# Patient Record
Sex: Female | Born: 1960
Health system: Southern US, Community
[De-identification: ages and names within clinical notes are randomized; demographics above are authoritative.]

## PROBLEM LIST (undated history)

## (undated) DIAGNOSIS — T7840XA Allergy, unspecified, initial encounter: Secondary | ICD-10-CM

## (undated) DIAGNOSIS — I10 Essential (primary) hypertension: Secondary | ICD-10-CM

## (undated) DIAGNOSIS — H6690 Otitis media, unspecified, unspecified ear: Secondary | ICD-10-CM

## (undated) DIAGNOSIS — F419 Anxiety disorder, unspecified: Secondary | ICD-10-CM

## (undated) HISTORY — DX: Essential (primary) hypertension: I10

## (undated) HISTORY — DX: Anxiety disorder, unspecified: F41.9

## (undated) HISTORY — DX: Otitis media, unspecified, unspecified ear: H66.90

## (undated) HISTORY — PX: TUBAL LIGATION: SHX77

## (undated) HISTORY — PX: TONSILLECTOMY: SUR1361

## (undated) HISTORY — DX: Allergy, unspecified, initial encounter: T78.40XA

---

## 1998-07-26 ENCOUNTER — Other Ambulatory Visit: Admission: RE | Admit: 1998-07-26 | Discharge: 1998-07-26 | Payer: Self-pay | Admitting: Family Medicine

## 1999-06-09 ENCOUNTER — Encounter: Payer: Self-pay | Admitting: Family Medicine

## 1999-06-09 ENCOUNTER — Encounter: Admission: RE | Admit: 1999-06-09 | Discharge: 1999-06-09 | Payer: Self-pay | Admitting: Family Medicine

## 1999-07-21 ENCOUNTER — Encounter: Admission: RE | Admit: 1999-07-21 | Discharge: 1999-09-11 | Payer: Self-pay | Admitting: Family Medicine

## 1999-12-17 ENCOUNTER — Other Ambulatory Visit: Admission: RE | Admit: 1999-12-17 | Discharge: 1999-12-17 | Payer: Self-pay | Admitting: Family Medicine

## 2000-12-30 ENCOUNTER — Ambulatory Visit (HOSPITAL_COMMUNITY): Admission: RE | Admit: 2000-12-30 | Discharge: 2000-12-30 | Payer: Self-pay | Admitting: Gastroenterology

## 2002-01-30 ENCOUNTER — Other Ambulatory Visit: Admission: RE | Admit: 2002-01-30 | Discharge: 2002-01-30 | Payer: Self-pay | Admitting: Family Medicine

## 2003-09-21 ENCOUNTER — Emergency Department (HOSPITAL_COMMUNITY): Admission: EM | Admit: 2003-09-21 | Discharge: 2003-09-21 | Payer: Self-pay | Admitting: Family Medicine

## 2004-04-17 ENCOUNTER — Emergency Department (HOSPITAL_COMMUNITY): Admission: EM | Admit: 2004-04-17 | Discharge: 2004-04-18 | Payer: Self-pay | Admitting: Emergency Medicine

## 2006-12-05 ENCOUNTER — Emergency Department (HOSPITAL_COMMUNITY): Admission: EM | Admit: 2006-12-05 | Discharge: 2006-12-05 | Payer: Self-pay | Admitting: Emergency Medicine

## 2008-11-30 ENCOUNTER — Encounter: Admission: RE | Admit: 2008-11-30 | Discharge: 2008-11-30 | Payer: Self-pay | Admitting: Family Medicine

## 2009-02-12 ENCOUNTER — Encounter: Admission: RE | Admit: 2009-02-12 | Discharge: 2009-02-12 | Payer: Self-pay | Admitting: Family Medicine

## 2009-04-22 ENCOUNTER — Ambulatory Visit (HOSPITAL_COMMUNITY): Admission: RE | Admit: 2009-04-22 | Discharge: 2009-04-22 | Payer: Self-pay | Admitting: Family Medicine

## 2010-03-09 ENCOUNTER — Emergency Department (HOSPITAL_COMMUNITY)
Admission: EM | Admit: 2010-03-09 | Discharge: 2010-03-09 | Payer: Self-pay | Source: Home / Self Care | Admitting: Emergency Medicine

## 2010-04-14 ENCOUNTER — Emergency Department (HOSPITAL_COMMUNITY)
Admission: EM | Admit: 2010-04-14 | Discharge: 2010-04-14 | Payer: Self-pay | Source: Home / Self Care | Admitting: Emergency Medicine

## 2010-05-07 ENCOUNTER — Ambulatory Visit (HOSPITAL_BASED_OUTPATIENT_CLINIC_OR_DEPARTMENT_OTHER)
Admission: RE | Admit: 2010-05-07 | Discharge: 2010-05-07 | Payer: Self-pay | Source: Home / Self Care | Attending: Family Medicine | Admitting: Family Medicine

## 2010-05-11 ENCOUNTER — Encounter: Payer: Self-pay | Admitting: Family Medicine

## 2010-06-19 ENCOUNTER — Encounter: Payer: Self-pay | Admitting: Cardiovascular Disease

## 2010-06-30 ENCOUNTER — Encounter: Payer: Self-pay | Admitting: Cardiovascular Disease

## 2010-07-01 LAB — BASIC METABOLIC PANEL
CO2: 29 mEq/L (ref 19–32)
Calcium: 9.1 mg/dL (ref 8.4–10.5)
Creatinine, Ser: 1.05 mg/dL (ref 0.4–1.2)
GFR calc Af Amer: >60 mL/min (ref >60)
GFR calc non Af Amer: 56 mL/min — ABNORMAL LOW (ref >60)
Sodium: 136 mEq/L (ref 135–145)

## 2010-07-01 LAB — CBC
Hemoglobin: 11.4 g/dL — ABNORMAL LOW (ref 12.0–15.0)
MCH: 28.2 pg (ref 26.0–34.0)
MCHC: 34.5 g/dL (ref 30.0–36.0)
Platelets: 311 10*3/uL (ref 150–400)
RBC: 4.05 MIL/uL (ref 3.87–5.11)

## 2010-07-01 LAB — DIFFERENTIAL
Basophils Relative: 0 % (ref 0–1)
Eosinophils Absolute: 0 10*3/uL (ref 0.0–0.7)
Neutro Abs: 8.6 10*3/uL — ABNORMAL HIGH (ref 1.7–7.7)
Neutrophils Relative %: 74 % (ref 43–77)

## 2010-07-01 LAB — POCT PREGNANCY, URINE: Preg Test, Ur: NEGATIVE

## 2010-07-04 ENCOUNTER — Encounter: Payer: Self-pay | Admitting: Cardiovascular Disease

## 2010-07-04 ENCOUNTER — Ambulatory Visit (INDEPENDENT_AMBULATORY_CARE_PROVIDER_SITE_OTHER): Payer: 59 | Admitting: Cardiovascular Disease

## 2010-07-04 DIAGNOSIS — R55 Syncope and collapse: Secondary | ICD-10-CM

## 2010-07-07 ENCOUNTER — Other Ambulatory Visit: Payer: Self-pay | Admitting: Cardiovascular Disease

## 2010-07-07 DIAGNOSIS — R55 Syncope and collapse: Secondary | ICD-10-CM

## 2010-07-08 NOTE — Assessment & Plan Note (Signed)
Summary: syncope/ref dr Sonia Baller [phone] cign[employer]/mt   Visit Type:  new pt visit Referring Provider:  [name] Primary Provider:  [name]  CC:  pt states she had syncope episode back in Nov and states she was sick at the time when this episode happened said she had just been put on prednisone at that time as well.......  History of Present Illness: 50 yo WF with history of HTN, allergic rhinitis, recurrent ear infections who is here today for evaluation of syncope. She tells me that she passed out in November. She was told at [facility] at that time that her potassium was low. Since then, she has occasional dizziness. She feels that the room is spinning. There is no associated chest pain, SOB, diaphoresis, n/v. She has had no syncope over the last five months. She has been battling sinus issues and was recently told that her tympanic membranes were swollen. She has been on antibiotics and steroids. She has no awareness of tachycardia, palpitations or awareness of irregularity of her heart rhythm.   Current Medications (verified): 1)  Lisinopril-Hydrochlorothiazide 10-12.5 Mg Tabs (Lisinopril-Hydrochlorothiazide) .Marland Kitchen.. 1 Tab Once Daily 2)  Klonopin 0.5 Mg Tabs (Clonazepam) .Marland Kitchen.. 1 Tab Three Times A Day As Needed 3)  Klor-Con 10 10 Meq Cr-Tabs (Potassium Chloride) .Marland Kitchen.. 1 Tab Once Daily 4)  Prednisone (Pak) 5 Mg Tabs (Prednisone) .... As Directed 5)  Augmentin 500-125 Mg Tabs (Amoxicillin-Pot Clavulanate) .Marland Kitchen.. 1 Tab Three Times A Day 6)  Zyrtec Allergy 10 Mg Tabs (Cetirizine Hcl) .Marland Kitchen.. 1 Tab At Bedtime 7)  Nasonex 50 Mcg/act Susp (Mometasone Furoate) .... As Directed  Allergies (verified): No Known Drug Allergies  Past History:  Past Medical History: HTN Anxiety Recurrent ear infections  Past Surgical History: Bilateral tubal ligation Tonsillectomy  Family History: Mother-alive, HTN Father-deceased, lung cancer 1 sister and 1 brother-both alive  and healthy  Social History: Remote tobacco, none for 25 years No alcohol No drug use Married, 3 children Customer service at [employer]  Review of Systems       The patient complains of dizziness.  The patient denies fatigue, malaise, fever, weight gain/loss, vision loss, decreased hearing, hoarseness, chest pain, palpitations, shortness of breath, prolonged cough, wheezing, sleep apnea, coughing up blood, abdominal pain, blood in stool, nausea, vomiting, diarrhea, heartburn, incontinence, blood in urine, muscle weakness, joint pain, leg swelling, rash, skin lesions, headache, fainting, depression, anxiety, enlarged lymph nodes, easy bruising or bleeding, and environmental allergies.    Vital Signs:  Patient profile:   50 year old female Height:      67.5 inches Weight:      243.50 pounds BMI:     37.71 Pulse rate:   74 / minute Pulse (ortho):   78 / minute Pulse rhythm:   regular BP sitting:   114 / 80  (left arm) BP standing:   113 / 73 Cuff size:   regular  Vitals Entered By: Danielle Rankin, CMA (July 04, 2010 4:22 PM)  Serial Vital Signs/Assessments:  Time      Position  BP       Pulse  Resp  Temp     By 4:41 PM   Lying LA  120/75   74                    Danielle Rankin, CMA 4:44 PM   Sitting   124/74   75  Danielle Rankin, CMA 4:45 PM   Standing  113/73   [address] [place] 4:47 PM   Standing  111/76   [address], [place] 4:49 PM   Standing  107/75   89                    Danielle Rankin, [place]  Comments: 4:41 PM no sxms By: Danielle Rankin, CMA  4:44 PM no sxms By: Danielle Rankin, CMA  4:45 PM no sxms By: Danielle Rankin, CMA  4:47 PM no sxms By: Danielle Rankin, CMA  4:49 PM no sxms By: Danielle Rankin, CMA    Physical Exam  General:  General: Well developed, well nourished, NAD HEENT: OP clear, mucus membranes moist SKIN: warm, dry Neuro: No focal deficits Musculoskeletal: Muscle strength 5/5 all ext Psychiatric: Mood  and affect normal Neck: No JVD, no carotid bruits, no thyromegaly, no lymphadenopathy. Lungs:Clear bilaterally, no wheezes, rhonci, crackles CV: RRR no murmurs, gallops rubs Abdomen: soft, NT, ND, BS present Extremities: No edema, pulses 2+.    EKG  Procedure date:  07/04/2010  Findings:      NSR, poor R wave progression through the precordial leads.   Impression & Recommendations:  Problem # 1:  SYNCOPE AND COLLAPSE (ICD-780.2) One syncopal episode 5 months ago with 1-2 episodes per week of dizziness with the feeling that the room is spinning. No awareness of chest pain, palpitations or irregularity of her heart rhythm. I do not think this is arrhythmia related. I will check an echo to exclude structural heart disease. I will also check carotid artery dopplers to exclude carotid artery stenosis or fibromuscular dysplasia. This is most likely related to her ongoing sinus issues with otitits media or from vertigo. I do not think a tilt table test is necessary at this time and would most likely not be helpful. I will see her back in 3 weeks to review the results of the testing.  She is mildly orthostatic. Will have her drink extra water and add salt to her diet.   Her updated medication list for this problem includes:    Lisinopril-hydrochlorothiazide 10-12.5 Mg Tabs (Lisinopril-hydrochlorothiazide) .Marland Kitchen... 1 tab once daily    Prednisone (pak) 5 Mg Tabs (Prednisone) .Marland Kitchen... As directed  Other Orders: Echocardiogram (Echo) Carotid Duplex (Carotid Duplex) EKG w/ Interpretation (93000)  Patient Instructions: 1)  Your physician recommends that you schedule a follow-up appointment in: 3 weeks with Dr. Clifton James. 2)  Your physician recommends that you continue on your current medications as directed. Please refer to the Current Medication list given to you today. 3)  Your physician has requested that you have a carotid duplex. This test is an ultrasound of the carotid arteries in your neck. It  looks at blood flow through these arteries that supply the brain with blood. Allow one hour for this exam. There are no restrictions or special instructions. 4)  Your physician has requested that you have an echocardiogram.  Echocardiography is a painless test that uses sound waves to create images of your heart. It provides your doctor with information about the size and shape of your heart and how well your heart's chambers and valves are working.  This procedure takes approximately one hour. There are no restrictions for this procedure.

## 2010-07-08 NOTE — Consult Note (Signed)
Summary: [facility]   Imported By: Marylou Mccoy 07/03/2010 16:16:23  _____________________________________________________________________  External Attachment:    Type:   Image     Comment:   External Document

## 2010-07-08 NOTE — Consult Note (Signed)
Summary: [facility]   Imported By: Marylou Mccoy 07/03/2010 16:17:10  _____________________________________________________________________  External Attachment:    Type:   Image     Comment:   External Document

## 2010-07-16 ENCOUNTER — Other Ambulatory Visit (HOSPITAL_COMMUNITY): Payer: Self-pay | Admitting: Cardiovascular Disease

## 2010-07-16 DIAGNOSIS — R55 Syncope and collapse: Secondary | ICD-10-CM

## 2010-07-17 ENCOUNTER — Observation Stay (HOSPITAL_COMMUNITY)
Admission: EM | Admit: 2010-07-17 | Discharge: 2010-07-18 | Disposition: A | Discharge status: Home / Self Care | Payer: Managed Care, Other (non HMO) | Attending: Internal Medicine | Admitting: Internal Medicine

## 2010-07-17 ENCOUNTER — Observation Stay (HOSPITAL_COMMUNITY): Payer: Managed Care, Other (non HMO)

## 2010-07-17 ENCOUNTER — Emergency Department (HOSPITAL_COMMUNITY): Payer: Managed Care, Other (non HMO)

## 2010-07-17 DIAGNOSIS — D509 Iron deficiency anemia, unspecified: Secondary | ICD-10-CM | POA: Insufficient documentation

## 2010-07-17 DIAGNOSIS — F411 Generalized anxiety disorder: Secondary | ICD-10-CM | POA: Insufficient documentation

## 2010-07-17 DIAGNOSIS — T7840XA Allergy, unspecified, initial encounter: Secondary | ICD-10-CM | POA: Insufficient documentation

## 2010-07-17 DIAGNOSIS — I10 Essential (primary) hypertension: Secondary | ICD-10-CM | POA: Insufficient documentation

## 2010-07-17 DIAGNOSIS — E876 Hypokalemia: Secondary | ICD-10-CM | POA: Insufficient documentation

## 2010-07-17 DIAGNOSIS — R7301 Impaired fasting glucose: Secondary | ICD-10-CM | POA: Insufficient documentation

## 2010-07-17 DIAGNOSIS — R4789 Other speech disturbances: Secondary | ICD-10-CM | POA: Insufficient documentation

## 2010-07-17 DIAGNOSIS — X58XXXA Exposure to other specified factors, initial encounter: Secondary | ICD-10-CM | POA: Insufficient documentation

## 2010-07-17 DIAGNOSIS — J329 Chronic sinusitis, unspecified: Secondary | ICD-10-CM | POA: Insufficient documentation

## 2010-07-17 DIAGNOSIS — H539 Unspecified visual disturbance: Principal | ICD-10-CM | POA: Insufficient documentation

## 2010-07-17 DIAGNOSIS — E785 Hyperlipidemia, unspecified: Secondary | ICD-10-CM | POA: Insufficient documentation

## 2010-07-17 LAB — COMPREHENSIVE METABOLIC PANEL
ALT: 19 U/L (ref 0–35)
AST: 18 U/L (ref 0–37)
CO2: 27 mEq/L (ref 19–32)
Calcium: 8.8 mg/dL (ref 8.4–10.5)
GFR calc Af Amer: >60 mL/min (ref >60)
Potassium: 3.3 mEq/L — ABNORMAL LOW (ref 3.5–5.1)
Sodium: 138 mEq/L (ref 135–145)
Total Protein: 6.7 g/dL (ref 6.0–8.3)

## 2010-07-17 LAB — DIFFERENTIAL
Basophils Absolute: 0 10*3/uL (ref 0.0–0.1)
Basophils Relative: 0 % (ref 0–1)
Eosinophils Absolute: 0.1 10*3/uL (ref 0.0–0.7)
Neutro Abs: 5.1 10*3/uL (ref 1.7–7.7)
Neutrophils Relative %: 74 % (ref 43–77)

## 2010-07-17 LAB — APTT: aPTT: 26 seconds (ref 24–37)

## 2010-07-17 LAB — BASIC METABOLIC PANEL
Chloride: 100 mEq/L (ref 96–112)
Creatinine, Ser: 0.54 mg/dL (ref 0.4–1.2)
GFR calc Af Amer: >60 mL/min (ref >60)
Potassium: 3.4 mEq/L — ABNORMAL LOW (ref 3.5–5.1)
Sodium: 138 mEq/L (ref 135–145)

## 2010-07-17 LAB — PREGNANCY, URINE: Preg Test, Ur: NEGATIVE

## 2010-07-17 LAB — IRON AND TIBC
Saturation Ratios: 7 % — ABNORMAL LOW (ref 20–55)
UIBC: 317 ug/dL

## 2010-07-17 LAB — CBC
Hemoglobin: 10.7 g/dL — ABNORMAL LOW (ref 12.0–15.0)
Platelets: 252 10*3/uL (ref 150–400)
RBC: 4.24 MIL/uL (ref 3.87–5.11)
WBC: 6.9 10*3/uL (ref 4.0–10.5)

## 2010-07-17 LAB — URINALYSIS, ROUTINE W REFLEX MICROSCOPIC
Ketones, ur: NEGATIVE mg/dL
Nitrite: NEGATIVE
Specific Gravity, Urine: 1.018 (ref 1.005–1.030)
pH: 5.5 (ref 5.0–8.0)

## 2010-07-17 LAB — CARDIAC PANEL(CRET KIN+CKTOT+MB+TROPI): Relative Index: INVALID (ref 0.0–2.5)

## 2010-07-17 LAB — GLUCOSE, CAPILLARY: Glucose-Capillary: 136 mg/dL — ABNORMAL HIGH (ref 70–99)

## 2010-07-18 ENCOUNTER — Other Ambulatory Visit (HOSPITAL_COMMUNITY): Payer: 59 | Admitting: Radiology

## 2010-07-18 ENCOUNTER — Other Ambulatory Visit: Payer: Self-pay | Admitting: Internal Medicine

## 2010-07-18 ENCOUNTER — Encounter: Payer: 59 | Admitting: Cardiology

## 2010-07-18 DIAGNOSIS — G459 Transient cerebral ischemic attack, unspecified: Secondary | ICD-10-CM

## 2010-07-18 LAB — COMPREHENSIVE METABOLIC PANEL
Albumin: 3 g/dL — ABNORMAL LOW (ref 3.5–5.2)
Alkaline Phosphatase: 68 U/L (ref 39–117)
BUN: 7 mg/dL (ref 6–23)
Creatinine, Ser: 0.56 mg/dL (ref 0.4–1.2)
Glucose, Bld: 113 mg/dL — ABNORMAL HIGH (ref 70–99)
Total Bilirubin: 1.1 mg/dL (ref 0.3–1.2)
Total Protein: 6 g/dL (ref 6.0–8.3)

## 2010-07-18 LAB — LIPID PANEL
Cholesterol: 163 mg/dL (ref 0–200)
LDL Cholesterol: 127 mg/dL — ABNORMAL HIGH (ref 0–99)
Total CHOL/HDL Ratio: 8.6 RATIO

## 2010-07-18 LAB — CBC
HCT: 31.8 % — ABNORMAL LOW (ref 36.0–46.0)
MCH: 25.2 pg — ABNORMAL LOW (ref 26.0–34.0)
MCV: 81.7 fL (ref 78.0–100.0)
Platelets: 254 10*3/uL (ref 150–400)
RDW: 17.5 % — ABNORMAL HIGH (ref 11.5–15.5)

## 2010-07-18 LAB — VITAMIN B12: Vitamin B-12: 314 pg/mL (ref 211–911)

## 2010-07-18 LAB — FERRITIN: Ferritin: 9 ng/mL — ABNORMAL LOW (ref 10–291)

## 2010-07-18 LAB — GLUCOSE, CAPILLARY
Glucose-Capillary: 195 mg/dL — ABNORMAL HIGH (ref 70–99)
Glucose-Capillary: 125 mg/dL — ABNORMAL HIGH (ref 70–99)

## 2010-07-18 LAB — HEMOGLOBIN A1C: Mean Plasma Glucose: 137 mg/dL — ABNORMAL HIGH (ref <117)

## 2010-07-18 LAB — FOLATE: Folate: >20 ng/mL

## 2010-07-18 NOTE — H&P (Signed)
NAME:  Diana [name], Diana [name] NO.:  [id number]  MEDICAL RECORD NO.:  [id number]           PATIENT TYPE:  O  LOCATION:  1428                         FACILITY:  [facility]  PHYSICIAN:  Hillery Aldo, M.D.   DATE OF BIRTH:  03/27/1961  DATE OF ADMISSION:  07/17/2010 DATE OF DISCHARGE:                             HISTORY & PHYSICAL   PRIMARY CARE PHYSICIAN:  [name], M.D.  CARDIOLOGIST:  Verne Carrow, MD  CHIEF COMPLAINT:  Transient visual changes with garbled speech.  HISTORY OF PRESENT ILLNESS:  The patient is a 50 year old female with past medical history of long-standing problems with vertigo and transient dizziness.  She had a syncopal episode back in November and subsequently was seen recently by the cardiologist for evaluation of this.  She had no ensuing syncope over the past 5 months, but reports significant sinus issues and has been followed by an ear, nose, and throat specialist for this.  The patient states that she developed the sudden onset of visual disturbance with a bright light and a strobe light phenomenon while at work today.  She describes her head is feeling weird, but she cannot characterize this further.  She denies frank headache.  Also while at work, the patient went to answer the phone and had garbled speech.  She was aware that she was attempting to say one thing, but different words would come out.  The patient states that her symptoms resolved within 30-40 minutes and she has had no residual symptoms.  She has never had similar speech problems, but notes that she did have visual symptoms in the past and has been seen by a neurologist. She was ultimately diagnosed with ocular migraines and had her last episode of visual disturbance approximately a year ago.  She also reported a history of paresthesias for which she has seen Dr. [name] of Neurology and had a complete workup to rule out multiple sclerosis at that time.  Upon  initial evaluation in the emergency department, the patient is neurologically intact, but referred to the Hospitalist Service for further evaluation and treatment of possible TIA.  PAST MEDICAL HISTORY: 1. Hypertension. 2. Syncope. 3. Allergic rhinitis. 4. History of recurrent ear infections 5. History of vertigo.  PAST SURGICAL HISTORY: 1. Bilateral tubal ligation. 2. Tonsillectomy.  FAMILY HISTORY:  The patient's mother is alive with hypertension.  The patient's father is deceased from lung cancer.  She has 2 siblings who are alive and healthy.  There is no family history of strokes.  SOCIAL HISTORY:  The patient is married with 3 children.  She works in Clinical biochemist at [employer].  There is no history of alcohol or drug use.  She quit smoking over 25 years ago.  ALLERGIES:  No known drug allergies.  CURRENT MEDICATIONS: 1. Zyrtec 10 mg p.o. daily. 2. Nasonex 1 spray intranasally q.h.s. 3. Lisinopril/HCTZ 10/12.5 mg p.o. daily. 4. Klonopin 0.5 mg p.o. b.i.d. p.r.n.  REVIEW OF SYSTEMS:  CONSTITUTIONAL:  No fever, chills, weight loss, or night sweats.  HEENT:  Sinus congestion and allergies.  CARDIOVASCULAR: No chest pain.  Occasional palpitations.  RESPIRATORY:  No dyspnea or cough.  GI:  No nausea, vomiting, diarrhea, melena, or hematochezia. GU:  No dysuria.  NEUROLOGIC:  No headaches.  No focal weakness. Occasional paresthesias in the fingertips and toes, more so on the left and this is long-standing for many years.  Again, she has seen Dr. [name] for this in the past with an extensive evaluation unrevealing.  A comprehensive 14-point review of systems is otherwise unremarkable.  PHYSICAL EXAMINATION:  VITAL SIGNS:  Temperature 97.9, pulse 104, respirations 18, blood pressure 142/81, O2 saturation 100% on room air. GENERAL:  Well-developed, well-nourished mildly obese female who is in no acute distress. HEENT:  Normocephalic, atraumatic.  PERRL.  EOMI.  Visual  fields are full.  Oropharynx is clear.  Tongue is midline.  Palate rises symmetrically.  Mucous membranes are moist. NECK:  Supple.  No thyromegaly, no lymphadenopathy, no jugular venous distention, no carotid bruits. CHEST:  Lungs are clear to auscultation bilaterally with good air movement. HEART:  Tachycardic rate, regular rhythm.  No murmurs, rubs, or gallops. ABDOMEN:  Soft, nontender, nondistended with normoactive bowel sounds. EXTREMITIES:  No clubbing, edema, or cyanosis. SKIN:  Warm and dry.  No rashes. NEUROLOGIC:  The patient is alert and oriented x3.  Cranial nerves II through XII are grossly intact.  Moves all extremities x4 with equal strength.  No pronator drift.  No cerebellar signs.  Gait not tested.  DATA REVIEW:  CT scan of the head shows no acute or significant intracranial abnormalities.  LABORATORY DATA:  Urinalysis is negative for nitrites and leukocytes. Urine pregnancy testing is negative.  Sodium is 138, potassium 3.4, chloride 100, bicarb 27, BUN 6, creatinine 0.54, glucose 154, calcium 9.0.  White blood cell count is 6.9, hemoglobin 10.7, hematocrit 34.5, platelets 252.  ASSESSMENT AND PLAN: 1. Transient ischemic attack:  We will admit the patient, obtain an     MRI/MRA of the brain, 2-dimensional echocardiogram, and carotid     Dopplers.  We will place the patient on aspirin and further risk     stratify her by checking a fasting lipid panel.  We will check a     hemoglobin A1c and monitor her blood pressure closely for control. 2. Hypokalemia:  Likely due to diuretic therapy.  We will replace the     patient's potassium, put her on routine supplementation. 3. Normocytic anemia:  We will obtain an anemia panel, especially     given her paresthesias.  She may have B12 deficiency. 4. Hypertension:  We will continue the patient's lisinopril/HCTZ. 5. Anxiety:  We will continue the patient's clonidine p.r.n. 6. Seasonal allergies:  Continue the patient's  Zyrtec and Nasonex. 7. Prophylaxis:  Place the patient on Lovenox for deep venous     thrombosis prophylaxis.  Time spent on admission including face-to-face time equals approximately 55 minutes.     Hillery Aldo, M.D.     CR/MEDQ  D:  07/17/2010  T:  07/17/2010  Job:  [id number]  cc:   Verne Carrow, MD [address] [place] [address]  [name], M.D. Fax: [phone]  Electronically Signed by Hillery Aldo M.D. on 07/18/2010 07:09:00 AM

## 2010-07-25 ENCOUNTER — Encounter: Payer: Self-pay | Admitting: Cardiovascular Disease

## 2010-07-28 NOTE — Discharge Summary (Signed)
NAME:  Diana [name], Diana [name] NO.:  [id number]  MEDICAL RECORD NO.:  [id number]           PATIENT TYPE:  O  LOCATION:  1428                         FACILITY:  [facility]  PHYSICIAN:  Hillery Aldo, M.D.   DATE OF BIRTH:  02-26-1961  DATE OF ADMISSION:  07/17/2010 DATE OF DISCHARGE:  07/18/2010                              DISCHARGE SUMMARY   PRIMARY CARE PHYSICIAN:  [name], MD  NEUROLOGIST:  Deanna Artis. [name], MD  CARDIOLOGIST:  Verne Carrow, MD  DISCHARGE DIAGNOSES: 1. Transient ischemic attack versus atypical migraine, stroke workup     essentially negative. 2. Hypokalemia. 3. Iron deficiency anemia. 4. Hypertension. 5. Dyslipidemia. 6. Anxiety. 7. Seasonal allergies/mild chronic sinusitis. 8. Impaired fasting glucose.  DISCHARGE MEDICATIONS: 1. Zyrtec 10 mg p.o. daily. 2. Nasonex 1 spray intranasally q.h.s. 3. Lisinopril/HCTZ 10/12.5 mg p.o. daily. 4. Klonopin 0.5 mg p.o. b.i.d. p.r.n. anxiety. 5. Imitrex 50 mg p.o. daily p.r.n. atypical migraine symptoms.  May     repeat x1. 6. Aspirin 81 mg p.o. daily. 7. Slow Iron 1 tablet p.o. daily. 8. Potassium chloride 20 mEq p.o. daily.  CONSULTATIONS:  None.  BRIEF ADMISSION HPI:  The patient is a 50 year old female who presented to the hospital with chief complaint of visual disturbance and transient garbled speech.  The patient was evaluated in the emergency department and subsequently referred to the hospitalist service for inpatient evaluation and treatment.  For the full details, please see my dictated H and P.  PROCEDURES AND DIAGNOSTIC STUDIES: 1. CT scan of the head on July 17, 2010 showed no acute or     significant intracranial abnormalities.  Stable exam. 2. MRI/MRA of the brain on July 18, 2010 showed minimal chronic     ischemia with no acute infarcts and mild chronic sinusitis.  The     MRA showed no abnormalities.  However, the left PICA was not     visualized which  could have been a congenital variation. 3. Carotid Dopplers on July 18, 2010 showed no evidence of     significant ICA stenosis. 4. Two-dimensional echocardiogram done on July 18, 2010:  Results are     pending at the time of dictation.  DISCHARGE LABORATORY VALUES:  Hemoglobin A1c was 6.4.  Total cholesterol was 163, triglycerides 83, HDL 19, LDL 127.  Sodium was 137, potassium 3.5, chloride 104, bicarb 26, BUN 7, creatinine 0.56, glucose 113, total bilirubin 1.1, alkaline phosphatase 68, AST 15, ALT 18, total protein 6.0, albumin 3.0.  White blood cell count 6.5, hemoglobin 9.8, hematocrit 31.8, platelets 254.  Iron was 25, total iron binding capacity 342, 7% saturation, urine iron-binding capacity 317, vitamin B12 of 314, serum folate greater than 20, ferritin 9.  Cardiac markers were negative.  HOSPITAL COURSE BY PROBLEM: 1. TIA versus atypical migraine:  The patient was admitted and CVA was     ruled out with MRI of the brain.  Carotid Dopplers were negative     for carotid artery stenosis.  A 2-dimensional echocardiogram is  pending at the time of this dictation.  However, the patient most  likely has atypical migraines as opposed to TIA.  She does have     some mild risk factors for cerebrovascular disease given her     hypertension (treated), mild hyperlipidemia, and mildly impaired     fasting glucose.  The patient was given extensive education with     regard to controlling her modifiable risk factors with diet and     exercise and was instructed to take a daily low-dose aspirin for     secondary prevention.  She will also be given a prescription for     Imitrex to see if this helps her symptoms and she was instructed to     follow up with Dr. [name] of Neurology for any non resolution of     symptoms. 2. Hypokalemia:  The patient was supplemented and will be discharged     on routine supplementation since she is currently on a diuretic and     is likely to need  potassium supplementation because of this. 3. Iron deficiency anemia:  The patient continues to menstruates and     this is most likely source of her anemia.  She has been treated     successfully with iron supplements in the past.  She was instructed     to go back on Slow Iron tablets daily. 4. Hypertension:  The patient's blood pressure is currently well     controlled on her lisinopril/HCTZ. 5. Mild dyslipidemia:  The patient wishes to try dietary changes for 6     weeks.  Would recommend an LDL of less than 100 and consideration     of the initiation of statin therapy if she is unable to achieve     this target with diet changes. 6. Anxiety:  Controlled on Klonopin. 7. Seasonal allergies/mild chronic sinusitis:  The patient was     continued on her antihistamine therapy as well as nasal steroids. 8. Impaired fasting glucose:  The patient was given dietary counseling     with regard to the importance of changing her diet to achieve     better control of both her blood sugar and lipids.  The patient     currently eats fast-food for most of her meals and is not regularly     consume fresh fruits or vegetables.  She was encouraged to consider     diet changes including the incorporation of more fresh fruits and     vegetables to her diet to help reverse her impaired fasting glucose     and to lower her cholesterol.  DISPOSITION:  The patient is medically stable and will be discharged home.  CONDITION ON DISCHARGE:  Improved.  INSTRUCTIONS FOR FOLLOWUP:  The patient should follow up with her primary care physician in 4-6 weeks and with Dr. [name] for nonresolution of symptoms.  Time spent coordinating care for discharge and discharge instructions including face-to-face time equals 40 minutes.     Hillery Aldo, M.D.     CR/MEDQ  D:  07/18/2010  T:  07/18/2010  Job:  [id number]  cc:   Deanna Artis. [name], M.D. Fax: [phone]  Verne Carrow, MD [address] [place] [address]  [name], M.D. Fax: [phone]  Electronically Signed by Hillery Aldo M.D. on 07/28/2010 12:35:32 PM

## 2010-07-30 ENCOUNTER — Ambulatory Visit: Payer: 59 | Admitting: Cardiovascular Disease

## 2010-09-05 NOTE — Op Note (Signed)
. [facility]  Patient:    TORI, DATTILIO Visit Number: [id number] MRN: [id number]          Service Type: END Location: ENDO Attending Physician:  Orland Mustard Dictated by:   Llana Aliment. Randa Evens, M.D. Proc. Date: 12/30/00 Admit Date:  12/30/2000   CC:         [name], M.D.   Operative Report  PROCEDURE PERFORMED:  Colonoscopy.  ENDOSCOPIST:  Llana Aliment. Randa Evens, M.D.  MEDICATIONS USED:  Fentanyl 125 mcg, Versed 12 mg IV.  INSTRUMENT:  INDICATIONS:  Heme positive stool, abdominal cramps and pain.  DESCRIPTION OF PROCEDURE:  The procedure had been explained to the patient and consent obtained.  With the patient in the left lateral decubitus position, a digital exam was performed.  We started off with the pediatric video colonoscope but were unable to pass the transverse colon with it, removed this and inserted the adult colonoscope and using position changes abdominal pressure, we were able to pass the scope down into the cecum.  The ileocecal valve and appendiceal orifice were identified.  The scope was withdrawn and the colon carefully examined.  The cecum, ascending colon, hepatic flexure, transverse colon, splenic flexure, descending and sigmoid colon were seen well upon withdrawal and were normal.  No polyps were seen.  There was no significant diverticular disease.  The scope withdrawn was withdrawn and the rectum was free of polyps.  The patient tolerated the procedure well. ASSESSMENT:  Heme positive stool with no findings on colonoscopy to explain it.  PLAN:  Routine post colonoscopy instructions.  Will see back in the office in six weeks and recheck her stool. Dictated by:   Llana Aliment. Randa Evens, M.D. Attending Physician:  Orland Mustard DD:  12/30/00 TD:  12/30/00 Job: [id number]

## 2010-09-16 ENCOUNTER — Encounter: Payer: Self-pay | Admitting: Cardiovascular Disease

## 2012-02-08 ENCOUNTER — Emergency Department (INDEPENDENT_AMBULATORY_CARE_PROVIDER_SITE_OTHER)
Admission: EM | Admit: 2012-02-08 | Discharge: 2012-02-08 | Disposition: A | Discharge status: Home / Self Care | Payer: Self-pay | Source: Home / Self Care | Attending: Emergency Medicine | Admitting: Emergency Medicine

## 2012-02-08 ENCOUNTER — Encounter (HOSPITAL_COMMUNITY): Payer: Self-pay

## 2012-02-08 DIAGNOSIS — R05 Cough: Secondary | ICD-10-CM

## 2012-02-08 DIAGNOSIS — R059 Cough, unspecified: Secondary | ICD-10-CM

## 2012-02-08 MED ORDER — CETIRIZINE-PSEUDOEPHEDRINE ER 5-120 MG PO TB12: 1.0000 | ORAL_TABLET | Freq: Every day | ORAL | Status: DC

## 2012-02-08 MED ORDER — PREDNISONE 10 MG PO TABS: 20.0000 mg | ORAL_TABLET | Freq: Every day | ORAL | Status: DC

## 2012-02-08 NOTE — ED Provider Notes (Signed)
History     CSN: [id number]  Arrival date & time 02/08/12  1017   First MD Initiated Contact with Patient 02/08/12 1028      Chief Complaint  Patient presents with  . Cough    (Consider location/radiation/quality/duration/timing/severity/associated sxs/prior treatment) HPI Comments: Patient presents urgent care describing that since Friday she's been coughing and having postnasal drainage. With now a productive cough of yellow sputum. She still sore sore along the retrosternal region when she coughs. She denies any shortness of breath, wheezing or fevers or body aches. She denies any nausea vomiting or abdominal pain. She has been taking some over-the-counter or product and has been on Zyrtec. As she does suffer from allergies.  Patient is a 51 y.o. female presenting with cough.  Cough This is a new problem. The current episode started more than 2 days ago. The problem occurs constantly. The problem has not changed since onset.The cough is productive of sputum. There has been no fever. Associated symptoms include headaches, rhinorrhea and sore throat. Pertinent negatives include no chills, no sweats, no ear congestion, no ear pain, no myalgias, no shortness of breath and no wheezing. She is not a smoker. Her past medical history is significant for bronchitis. Her past medical history does not include bronchiectasis, COPD or emphysema.    Past Medical History  Diagnosis Date  . Hypertension   . Anxiety   . Ear infection     recurrent    Past Surgical History  Procedure Date  . Tubal ligation     bilateral  . Tonsillectomy     Family History  Problem Relation Age of Onset  . Hypertension Mother     alive  . Lung cancer Father     deceased  . Other      1 brother and 1 sister both alive an healthy    History  Substance Use Topics  . Smoking status: Former Smoker    Types: Cigarettes    Quit date: 04/20/1985  . Smokeless tobacco: Not on file   Comment: remote tobacco  use   . Alcohol Use: No    OB History    Grav Para Term Preterm Abortions TAB SAB Ect Mult Living                  Review of Systems  Constitutional: Negative for fever, chills, activity change and fatigue.  HENT: Positive for congestion, sore throat, rhinorrhea and postnasal drip. Negative for ear pain, nosebleeds, sneezing and tinnitus.   Respiratory: Positive for cough and chest tightness. Negative for apnea, shortness of breath, wheezing and stridor.   Gastrointestinal: Negative for vomiting.  Musculoskeletal: Negative for myalgias, back pain and arthralgias.  Skin: Negative for pallor, rash and wound.  Neurological: Positive for headaches. Negative for dizziness, weakness and numbness.    Allergies  Review of patient's allergies indicates no known allergies.  Home Medications   Current Outpatient Rx  Name Route Sig Dispense Refill  . LISINOPRIL-HYDROCHLOROTHIAZIDE 10-12.5 MG PO TABS Oral Take 1 tablet by mouth daily.      Marland Kitchen CETIRIZINE-PSEUDOEPHEDRINE ER 5-120 MG PO TB12 Oral Take 1 tablet by mouth daily. 14 tablet 0  . CLONAZEPAM 0.5 MG PO TABS Oral Take 0.5 mg by mouth 3 (three) times daily as needed.      . MOMETASONE FUROATE 50 MCG/ACT NA SUSP Nasal 2 sprays by Nasal route. As directed     . POTASSIUM CHLORIDE 10 MEQ PO TBCR Oral Take 10 mEq by mouth  daily.      Marland Kitchen PREDNISONE 10 MG PO TABS Oral Take 2 tablets (20 mg total) by mouth daily. 10 tablet 0    BP 144/74  Pulse 84  Temp 98.6 F (37 C) (Oral)  Resp 20  SpO2 98%  Physical Exam  Nursing note and vitals reviewed. Constitutional: Vital signs are normal. She appears well-developed and well-nourished.  Non-toxic appearance. She does not have a sickly appearance. She does not appear ill.  HENT:  Right Ear: Tympanic membrane normal.  Left Ear: Tympanic membrane normal.  Mouth/Throat: Uvula is midline. Posterior oropharyngeal erythema present.  Eyes: Conjunctivae normal are normal.  Neck: Neck supple. No JVD  present.  Cardiovascular: Exam reveals no gallop and no friction rub.   No murmur heard. Pulmonary/Chest: Effort normal and breath sounds normal. No respiratory distress. She has no wheezes. She exhibits no tenderness.  Lymphadenopathy:    She has no cervical adenopathy.  Neurological: She is alert.  Skin: No rash noted. No erythema.    ED Course  Procedures (including critical care time)  Labs Reviewed - No data to display No results found.   1. Cough       MDM  Patient with a productive cough. Normal lung exam. Afebrile looking comfortable denies any chest pain while at rest this only exacerbated or perceived when she coughs. Patient also had a normal 30 seconds rhythm strip performed by handheld device. Patient was encouraged to use Zyrtec-D for 7 days and then to continue with her Zyrtec plain as previously indicated for allergies. Was also instructed if no improvement is noted after 2-3 days use Zyrtec-D to use prednisone for 5 days. Discussed with patient it is extremely unlikely that this processes infectious in character, and at this point I did not see any indication to use antibiotics. She somewhat agreed to pursue this treatment course I have encouraged her to followup with her primary care Dr. Or [place] if worsening symptoms.        Jimmie Molly, MD 02/08/12 1145

## 2012-02-08 NOTE — ED Notes (Signed)
C/o cough since Saturday, hist of seasonal allergies ; yesterday reportedly had yellow tinted secretions and soreness in chest . Pain w deep inspiration or ROM upper extremities (no changes w direct palpation of chest wall )

## 2013-08-19 ENCOUNTER — Encounter (HOSPITAL_COMMUNITY): Payer: Self-pay | Admitting: Emergency Medicine

## 2013-08-19 ENCOUNTER — Emergency Department (HOSPITAL_COMMUNITY)
Admission: EM | Admit: 2013-08-19 | Discharge: 2013-08-19 | Disposition: A | Discharge status: Home / Self Care | Payer: BC Managed Care – PPO | Attending: Emergency Medicine | Admitting: Emergency Medicine

## 2013-08-19 DIAGNOSIS — I10 Essential (primary) hypertension: Secondary | ICD-10-CM | POA: Insufficient documentation

## 2013-08-19 DIAGNOSIS — Z87891 Personal history of nicotine dependence: Secondary | ICD-10-CM | POA: Insufficient documentation

## 2013-08-19 DIAGNOSIS — M545 Low back pain, unspecified: Secondary | ICD-10-CM | POA: Insufficient documentation

## 2013-08-19 DIAGNOSIS — F411 Generalized anxiety disorder: Secondary | ICD-10-CM | POA: Insufficient documentation

## 2013-08-19 DIAGNOSIS — Z79899 Other long term (current) drug therapy: Secondary | ICD-10-CM | POA: Insufficient documentation

## 2013-08-19 DIAGNOSIS — M549 Dorsalgia, unspecified: Secondary | ICD-10-CM

## 2013-08-19 DIAGNOSIS — Z8669 Personal history of other diseases of the nervous system and sense organs: Secondary | ICD-10-CM | POA: Insufficient documentation

## 2013-08-19 DIAGNOSIS — IMO0002 Reserved for concepts with insufficient information to code with codable children: Secondary | ICD-10-CM | POA: Insufficient documentation

## 2013-08-19 MED ORDER — OXYCODONE-ACETAMINOPHEN 5-325 MG PO TABS: 1.0000 | ORAL_TABLET | Freq: Four times a day (QID) | ORAL | Status: DC | PRN

## 2013-08-19 MED ORDER — PREDNISONE 10 MG PO TABS: 20.0000 mg | ORAL_TABLET | Freq: Every day | ORAL | Status: DC

## 2013-08-19 MED ORDER — CYCLOBENZAPRINE HCL 10 MG PO TABS: 10.0000 mg | ORAL_TABLET | Freq: Three times a day (TID) | ORAL | Status: DC | PRN

## 2013-08-19 MED ORDER — PREDNISONE 20 MG PO TABS
60.0000 mg | ORAL_TABLET | Freq: Once | ORAL | Status: AC
  Administered 2013-08-19: 60 mg via ORAL
  Filled 2013-08-19: qty 3

## 2013-08-19 NOTE — ED Provider Notes (Signed)
CSN: [id number]     Arrival date & time 08/19/13  1944 History   None    This chart was scribed for non-physician practitioner, Ivonne AndrewPeter Guy Toney, PA-C working with Hurman HornJohn M Bednar, MD by Arlan OrganAshley Leger, ED Scribe. This patient was seen in room WTR5/WTR5 and the patient's care was started at 8:26 PM.   Chief Complaint  Patient presents with  . Back Pain   The history is provided by the patient. No language interpreter was used.    HPI Comments: Diana [name] is a 53 y.o. Female with a PMHx of hypertension who presents to the Emergency Department complaining of intermittent, severe lower back pain x 2 months that has progressively worsened in the last 24 hours. She denies any known recent injury or trauma to her back. Pt states this pain is exacerbated by bending, twisting, and generalized R/L ROM. She has tried OTC ibuprofen with no noticeable improvement. Denies trying any heat, ice, or home remedies for her discomfort. She admits to a history of a spinal bulging disc onset 15 years which she attributes with this current episode. At this time she denies any weakness, numbness, or tingling. Denies loss of bowel/bladder function or saddle anesthesia. Denies neck stiffness, headache, rash.  Denies fever or recent procedures to back. Pt has no other pertinent medical history. No other concerns this visit.   Past Medical History  Diagnosis Date  . Hypertension   . Anxiety   . Ear infection     recurrent   Past Surgical History  Procedure Laterality Date  . Tubal ligation      bilateral  . Tonsillectomy     Family History  Problem Relation Age of Onset  . Hypertension Mother     alive  . Lung cancer Father     deceased  . Other      1 brother and 1 sister both alive an healthy   History  Substance Use Topics  . Smoking status: Former Smoker    Types: Cigarettes    Quit date: 04/20/1985  . Smokeless tobacco: Not on file     Comment: remote tobacco use   . Alcohol Use: No   OB History    Grav Para Term Preterm Abortions TAB SAB Ect Mult Living                 Review of Systems  Constitutional: Negative for fever and chills.  Eyes: Negative for redness.  Respiratory: Negative for cough.   Musculoskeletal: Positive for back pain.  Skin: Negative for rash.  Neurological: Negative for weakness and numbness.  Psychiatric/Behavioral: Negative for confusion.      Allergies  Review of patient's allergies indicates no known allergies.  Home Medications   Prior to Admission medications   Medication Sig Start Date End Date Taking? Authorizing Provider  cetirizine-pseudoephedrine (ZYRTEC-D) 5-120 MG per tablet Take 1 tablet by mouth daily. 02/08/12   Jimmie MollyPaolo Coll, MD  clonazePAM (KLONOPIN) 0.5 MG tablet Take 0.5 mg by mouth 3 (three) times daily as needed.      Historical Provider, MD  lisinopril-hydrochlorothiazide (PRINZIDE,ZESTORETIC) 10-12.5 MG per tablet Take 1 tablet by mouth daily.      Historical Provider, MD  mometasone (NASONEX) 50 MCG/ACT nasal spray 2 sprays by Nasal route. As directed     Historical Provider, MD  potassium chloride (KLOR-CON) 10 MEQ CR tablet Take 10 mEq by mouth daily.      Historical Provider, MD  predniSONE (DELTASONE) 10 MG tablet Take  2 tablets (20 mg total) by mouth daily. 02/08/12   Jimmie MollyPaolo Coll, MD   Triage Vitals: BP 146/88  Pulse 79  Temp(Src) 98.8 F (37.1 C) (Oral)  Resp 18  SpO2 100%  LMP 08/18/2013   Physical Exam  Nursing note and vitals reviewed. Constitutional: She is oriented to person, place, and time. She appears well-developed and well-nourished. No distress.  HENT:  Head: Normocephalic and atraumatic.  Eyes: Conjunctivae and EOM are normal. Pupils are equal, round, and reactive to light.  Neck: Normal range of motion. Neck supple.  Cardiovascular: Normal rate.   Pulmonary/Chest: Effort normal. No respiratory distress. She has no wheezes. She has no rales.  Abdominal: Soft. There is no CVA tenderness.   Musculoskeletal: Normal range of motion. She exhibits no edema and no tenderness.       Lumbar back: She exhibits no swelling, no edema and no deformity.       Back:  Neurological: She is alert and oriented to person, place, and time. She has normal strength. No sensory deficit.  Reflex Scores:      Patellar reflexes are 2+ on the right side and 2+ on the left side. Somewhat stiff gait. No significant limp.  Skin: Skin is warm and dry.  Psychiatric: She has a normal mood and affect. Her behavior is normal.    ED Course  Procedures   DIAGNOSTIC STUDIES: Oxygen Saturation is 100% on RA, Normal by my interpretation.    COORDINATION OF CARE: 8:35 PM-patient seen and evaluated. Patient appears comfortable no acute distress. No concerning or red flag symptoms for her worsening back pain. Does have a history of similar back pains in the past less severe. No indications for imaging at this time. We'll treat symptomatically and have patient followup with PCP.  Discussed treatment plan with pt at bedside and pt agreed to plan.      MDM   Final diagnoses:  Back pain      I personally performed the services described in this documentation, which was scribed in my presence. The recorded information has been reviewed and is accurate.    Angus Sellereter S Chadley Dziedzic, PA-C 08/19/13 2119

## 2013-08-19 NOTE — Discharge Instructions (Signed)
Please use the medications prescribed to help with your pain and symptoms.  Return for any changing or worsening symptoms.   Back Pain, Adult Low back pain is very common. About 1 in 5 people have back pain.The cause of low back pain is rarely dangerous. The pain often gets better over time.About half of people with a sudden onset of back pain feel better in just 2 weeks. About 8 in 10 people feel better by 6 weeks.  CAUSES Some common causes of back pain include:  Strain of the muscles or ligaments supporting the spine.  Wear and tear (degeneration) of the spinal discs.  Arthritis.  Direct injury to the back. DIAGNOSIS Most of the time, the direct cause of low back pain is not known.However, back pain can be treated effectively even when the exact cause of the pain is unknown.Answering your caregiver's questions about your overall health and symptoms is one of the most accurate ways to make sure the cause of your pain is not dangerous. If your caregiver needs more information, he or she may order lab work or imaging tests (X-rays or MRIs).However, even if imaging tests show changes in your back, this usually does not require surgery. HOME CARE INSTRUCTIONS For many people, back pain returns.Since low back pain is rarely dangerous, it is often a condition that people can learn to [facility]manageon their own.   Remain active. It is stressful on the back to sit or stand in one place. Do not sit, drive, or stand in one place for more than 30 minutes at a time. Take short walks on level surfaces as soon as pain allows.Try to increase the length of time you walk each day.  Do not stay in bed.Resting more than 1 or 2 days can delay your recovery.  Do not avoid exercise or work.Your body is made to move.It is not dangerous to be active, even though your back may hurt.Your back will likely heal faster if you return to being active before your pain is gone.  Pay attention to your body when you  bend and lift. Many people have less discomfortwhen lifting if they bend their knees, keep the load close to their bodies,and avoid twisting. Often, the most comfortable positions are those that put less stress on your recovering back.  Find a comfortable position to sleep. Use a firm mattress and lie on your side with your knees slightly bent. If you lie on your back, put a pillow under your knees.  Only take over-the-counter or prescription medicines as directed by your caregiver. Over-the-counter medicines to reduce pain and inflammation are often the most helpful.Your caregiver may prescribe muscle relaxant drugs.These medicines help dull your pain so you can more quickly return to your normal activities and healthy exercise.  Put ice on the injured area.  Put ice in a plastic bag.  Place a towel between your skin and the bag.  Leave the ice on for 15-20 minutes, 03-04 times a day for the first 2 to 3 days. After that, ice and heat may be alternated to reduce pain and spasms.  Ask your caregiver about trying back exercises and gentle massage. This may be of some benefit.  Avoid feeling anxious or stressed.Stress increases muscle tension and can worsen back pain.It is important to recognize when you are anxious or stressed and learn ways to manage it.Exercise is a great option. SEEK MEDICAL CARE IF:  You have pain that is not relieved with rest or medicine.  You  have pain that does not improve in 1 week.  You have new symptoms.  You are generally not feeling well. SEEK IMMEDIATE MEDICAL CARE IF:   You have pain that radiates from your back into your legs.  You develop new bowel or bladder control problems.  You have unusual weakness or numbness in your arms or legs.  You develop nausea or vomiting.  You develop abdominal pain.  You feel faint. Document Released: 04/06/2005 Document Revised: 10/06/2011 Document Reviewed: 08/25/2010 [facility] Patient Information 2014  [place], [place].    Back Exercises Back exercises help treat and prevent back injuries. The goal of back exercises is to increase the strength of your abdominal and back muscles and the flexibility of your back. These exercises should be started when you no longer have back pain. Back exercises include:  Pelvic Tilt. Lie on your back with your knees bent. Tilt your pelvis until the lower part of your back is against the floor. Hold this position 5 to 10 sec and repeat 5 to 10 times.  Knee to Chest. Pull first 1 knee up against your chest and hold for 20 to 30 seconds, repeat this with the other knee, and then both knees. This may be done with the other leg straight or bent, whichever feels better.  Sit-Ups or Curl-Ups. Bend your knees 90 degrees. Start with tilting your pelvis, and do a partial, slow sit-up, lifting your trunk only 30 to 45 degrees off the floor. Take at least 2 to 3 seconds for each sit-up. Do not do sit-ups with your knees out straight. If partial sit-ups are difficult, simply do the above but with only tightening your abdominal muscles and holding it as directed.  Hip-Lift. Lie on your back with your knees flexed 90 degrees. Push down with your feet and shoulders as you raise your hips a couple inches off the floor; hold for 10 seconds, repeat 5 to 10 times.  Back arches. Lie on your stomach, propping yourself up on bent elbows. Slowly press on your hands, causing an arch in your low back. Repeat 3 to 5 times. Any initial stiffness and discomfort should lessen with repetition over time.  Shoulder-Lifts. Lie face down with arms beside your body. Keep hips and torso pressed to floor as you slowly lift your head and shoulders off the floor. Do not overdo your exercises, especially in the beginning. Exercises may cause you some mild back discomfort which lasts for a few minutes; however, if the pain is more severe, or lasts for more than 15 minutes, do not continue exercises until you  see your caregiver. Improvement with exercise therapy for back problems is slow.  See your caregivers for assistance with developing a proper back exercise program. Document Released: 05/14/2004 Document Revised: 06/29/2011 Document Reviewed: 02/05/2011 [facility] FacilityExit[facility] Patient Information 2014 [place], [place].

## 2013-08-19 NOTE — ED Notes (Signed)
Patient states that she has had pain to her back for a few days - today has worsened

## 2013-08-20 ENCOUNTER — Emergency Department (HOSPITAL_COMMUNITY)
Admission: EM | Admit: 2013-08-20 | Discharge: 2013-08-20 | Disposition: A | Discharge status: Home / Self Care | Payer: BC Managed Care – PPO | Attending: Emergency Medicine | Admitting: Emergency Medicine

## 2013-08-20 ENCOUNTER — Encounter (HOSPITAL_COMMUNITY): Payer: Self-pay | Admitting: Emergency Medicine

## 2013-08-20 DIAGNOSIS — R32 Unspecified urinary incontinence: Secondary | ICD-10-CM

## 2013-08-20 DIAGNOSIS — Z87891 Personal history of nicotine dependence: Secondary | ICD-10-CM | POA: Insufficient documentation

## 2013-08-20 DIAGNOSIS — N393 Stress incontinence (female) (male): Secondary | ICD-10-CM | POA: Insufficient documentation

## 2013-08-20 DIAGNOSIS — IMO0002 Reserved for concepts with insufficient information to code with codable children: Secondary | ICD-10-CM | POA: Insufficient documentation

## 2013-08-20 DIAGNOSIS — Z8669 Personal history of other diseases of the nervous system and sense organs: Secondary | ICD-10-CM | POA: Insufficient documentation

## 2013-08-20 DIAGNOSIS — Z79899 Other long term (current) drug therapy: Secondary | ICD-10-CM | POA: Insufficient documentation

## 2013-08-20 DIAGNOSIS — I10 Essential (primary) hypertension: Secondary | ICD-10-CM | POA: Insufficient documentation

## 2013-08-20 DIAGNOSIS — F411 Generalized anxiety disorder: Secondary | ICD-10-CM | POA: Insufficient documentation

## 2013-08-20 LAB — URINALYSIS, ROUTINE W REFLEX MICROSCOPIC
Bilirubin Urine: NEGATIVE
GLUCOSE, UA: NEGATIVE mg/dL
HGB URINE DIPSTICK: NEGATIVE
KETONES UR: NEGATIVE mg/dL
Leukocytes, UA: NEGATIVE
Nitrite: NEGATIVE
PROTEIN: NEGATIVE mg/dL
Specific Gravity, Urine: 1.038 — ABNORMAL HIGH (ref 1.005–1.030)
Urobilinogen, UA: 1 mg/dL (ref 0.0–1.0)
pH: 6 (ref 5.0–8.0)

## 2013-08-20 NOTE — ED Notes (Signed)
Rectal exam by Roselyn BeringJ Knapp, chaperoned by this nurse.

## 2013-08-20 NOTE — ED Provider Notes (Signed)
CSN: [id number]     Arrival date & time 08/20/13  1916 History   First MD Initiated Contact with Patient 08/20/13 2041     Chief Complaint  Patient presents with  . Urinary Incontinence    HPI Patient presents to the emergency room for evaluation of an episode of urinary incontinence.  Patient has been having trouble with lower back pain for approximately 2 months. She was seen in the emergency room yesterday because she had noticed progressive symptoms in the previous 24 hours. She cannot recall any recent injuries. Patient's pain was in her lower back but would radiate down the back of both thighs. The pain was exacerbated by movement. She's not having any trouble with numbness or weakness. She was having any trouble with urinary incontinence or perineal anesthesia.  Patient was treated with medications for pain and was given precautions.  Patient states her symptoms improved today however she had an episode of urinary incontinence. Patient reviewed the discharge instructions and came to the emergency room for evaluation.  Past Medical History  Diagnosis Date  . Hypertension   . Anxiety   . Ear infection     recurrent   Past Surgical History  Procedure Laterality Date  . Tubal ligation      bilateral  . Tonsillectomy     Family History  Problem Relation Age of Onset  . Hypertension Mother     alive  . Lung cancer Father     deceased  . Other      1 brother and 1 sister both alive an healthy   History  Substance Use Topics  . Smoking status: Former Smoker    Types: Cigarettes    Quit date: 04/20/1985  . Smokeless tobacco: Not on file     Comment: remote tobacco use   . Alcohol Use: No   OB History   Grav Para Term Preterm Abortions TAB SAB Ect Mult Living                 Review of Systems  All other systems reviewed and are negative.     Allergies  Review of patient's allergies indicates no known allergies.  Home Medications   Prior to Admission medications    Medication Sig Start Date End Date Taking? Authorizing Provider  cetirizine-pseudoephedrine (ZYRTEC-D) 5-120 MG per tablet Take 1 tablet by mouth daily. 02/08/12  Yes Jimmie MollyPaolo Coll, MD  clonazePAM (KLONOPIN) 0.5 MG tablet Take 0.5 mg by mouth 3 (three) times daily as needed.     Yes Historical Provider, MD  lisinopril-hydrochlorothiazide (PRINZIDE,ZESTORETIC) 10-12.5 MG per tablet Take 1 tablet by mouth daily.     Yes Historical Provider, MD  mometasone (NASONEX) 50 MCG/ACT nasal spray 2 sprays by Nasal route. As directed    Yes Historical Provider, MD  oxyCODONE-acetaminophen (PERCOCET/ROXICET) 5-325 MG per tablet Take 1-2 tablets by mouth every 6 (six) hours as needed for severe pain. 08/19/13  Yes Peter S Dammen, PA-C  predniSONE (DELTASONE) 10 MG tablet Take 2 tablets (20 mg total) by mouth daily. 08/19/13  Yes Peter S Dammen, PA-C  cyclobenzaprine (FLEXERIL) 10 MG tablet Take 1 tablet (10 mg total) by mouth 3 (three) times daily as needed for muscle spasms. 08/19/13   Phill MutterPeter S Dammen, PA-C   BP 138/86  Pulse 101  Resp 20  SpO2 97%  LMP 08/18/2013 Physical Exam  Nursing note and vitals reviewed. Constitutional: She appears well-developed and well-nourished. No distress.  HENT:  Head: Normocephalic and atraumatic.  Right Ear: External ear normal.  Left Ear: External ear normal.  Nose: Nose normal.  Eyes: Conjunctivae and EOM are normal. Right eye exhibits no discharge. Left eye exhibits no discharge. No scleral icterus.  Neck: Neck supple. No tracheal deviation present.  Cardiovascular: Normal rate.   Pulmonary/Chest: Effort normal. No stridor. No respiratory distress.  Genitourinary:  Normal sensation in the perineum, normal rectal tone  Musculoskeletal: She exhibits no edema and no tenderness.       Lumbar back: She exhibits normal range of motion, no tenderness, no swelling and no edema.  Neurological: She is alert. She is not disoriented. No sensory deficit. Cranial nerve deficit: no  gross deficits. She exhibits normal muscle tone. Coordination normal.  Reflex Scores:      Patellar reflexes are 2+ on the right side and 2+ on the left side.      Achilles reflexes are 2+ on the right side and 2+ on the left side. Skin: Skin is warm and dry. No rash noted. She is not diaphoretic. No erythema.  Psychiatric: She has a normal mood and affect. Her behavior is normal. Thought content normal.    ED Course  Procedures (including critical care time) Labs Review Labs Reviewed  URINALYSIS, ROUTINE W REFLEX MICROSCOPIC - Abnormal; Notable for the following:    Specific Gravity, Urine 1.038 (*)    All other components within normal limits      MDM   Final diagnoses:  Incontinence in female    The patient did have an episode of incontinence. Her urinalysis is normal. Her neurologic exam is normal however, she has no abnormalities in her rectal tone. She's not having any decreased sensation or weakness. The patient is able to ambulate without difficulty. I do not feel she's having cauda equina syndrome.  Patient can safely followup with a neurosurgeon as an outpatient   Celene KrasJon R Bryor Rami, MD 08/20/13 2210

## 2013-08-20 NOTE — Discharge Instructions (Signed)
Sciatica °Sciatica is pain, weakness, numbness, or tingling along the path of the sciatic nerve. The nerve starts in the lower back and runs down the back of each leg. The nerve controls the muscles in the lower leg and in the back of the knee, while also providing sensation to the back of the thigh, lower leg, and the sole of your foot. Sciatica is a symptom of another medical condition. For instance, nerve damage or certain conditions, such as a herniated disk or bone spur on the spine, pinch or put pressure on the sciatic nerve. This causes the pain, weakness, or other sensations normally associated with sciatica. Generally, sciatica only affects one side of the body. °CAUSES  °· Herniated or slipped disc. °· Degenerative disk disease. °· A pain disorder involving the narrow muscle in the buttocks (piriformis syndrome). °· Pelvic injury or fracture. °· Pregnancy. °· Tumor (rare). °SYMPTOMS  °Symptoms can vary from mild to very severe. The symptoms usually travel from the low back to the buttocks and down the back of the leg. Symptoms can include: °· Mild tingling or dull aches in the lower back, leg, or hip. °· Numbness in the back of the calf or sole of the foot. °· Burning sensations in the lower back, leg, or hip. °· Sharp pains in the lower back, leg, or hip. °· Leg weakness. °· Severe back pain inhibiting movement. °These symptoms may get worse with coughing, sneezing, laughing, or prolonged sitting or standing. Also, being overweight may worsen symptoms. °DIAGNOSIS  °Your caregiver will perform a physical exam to look for common symptoms of sciatica. He or she may ask you to do certain movements or activities that would trigger sciatic nerve pain. Other tests may be performed to find the cause of the sciatica. These may include: °· Blood tests. °· X-rays. °· Imaging tests, such as an MRI or CT scan. °TREATMENT  °Treatment is directed at the cause of the sciatic pain. Sometimes, treatment is not necessary  and the pain and discomfort goes away on its own. If treatment is needed, your caregiver may suggest: °· Over-the-counter medicines to relieve pain. °· Prescription medicines, such as anti-inflammatory medicine, muscle relaxants, or narcotics. °· Applying heat or ice to the painful area. °· Steroid injections to lessen pain, irritation, and inflammation around the nerve. °· Reducing activity during periods of pain. °· Exercising and stretching to strengthen your abdomen and improve flexibility of your spine. Your caregiver may suggest losing weight if the extra weight makes the back pain worse. °· Physical therapy. °· Surgery to eliminate what is pressing or pinching the nerve, such as a bone spur or part of a herniated disk. °HOME CARE INSTRUCTIONS  °· Only take over-the-counter or prescription medicines for pain or discomfort as directed by your caregiver. °· Apply ice to the affected area for 20 minutes, 3 4 times a day for the first 48 72 hours. Then try heat in the same way. °· Exercise, stretch, or perform your usual activities if these do not aggravate your pain. °· Attend physical therapy sessions as directed by your caregiver. °· Keep all follow-up appointments as directed by your caregiver. °· Do not wear high heels or shoes that do not provide proper support. °· Check your mattress to see if it is too soft. A firm mattress may lessen your pain and discomfort. °SEEK IMMEDIATE MEDICAL CARE IF:  °· You lose control of your bowel or bladder (incontinence). °· You have increasing weakness in the lower back,   pelvis, buttocks, or legs. °· You have redness or swelling of your back. °· You have a burning sensation when you urinate. °· You have pain that gets worse when you lie down or awakens you at night. °· Your pain is worse than you have experienced in the past. °· Your pain is lasting longer than 4 weeks. °· You are suddenly losing weight without reason. °MAKE SURE YOU: °· Understand these  instructions. °· Will watch your condition. °· Will get help right away if you are not doing well or get worse. °Document Released: 03/31/2001 Document Revised: 10/06/2011 Document Reviewed: 08/16/2011 °[employer]® Patient Information ©2014 [employer]. ° °

## 2013-08-20 NOTE — ED Notes (Signed)
Pt presents with c/o urinary incontinence that started today.

## 2013-08-20 NOTE — ED Notes (Signed)
Pt arrived to the ED with a complaint of urinary incontinence.  Pt was seen here last night for back pain.  Pt states one of the conditions listed for a return visit was urinary incontinence.  Pt states that all of a sudden about an hour ago pt felt a sensation of warmth and wetness.  Pt is on her period but it turned out to be urine.

## 2013-08-22 DIAGNOSIS — Z87891 Personal history of nicotine dependence: Secondary | ICD-10-CM | POA: Insufficient documentation

## 2013-08-22 DIAGNOSIS — F411 Generalized anxiety disorder: Secondary | ICD-10-CM | POA: Insufficient documentation

## 2013-08-22 DIAGNOSIS — IMO0002 Reserved for concepts with insufficient information to code with codable children: Secondary | ICD-10-CM | POA: Insufficient documentation

## 2013-08-22 DIAGNOSIS — I10 Essential (primary) hypertension: Secondary | ICD-10-CM | POA: Insufficient documentation

## 2013-08-22 DIAGNOSIS — Z8669 Personal history of other diseases of the nervous system and sense organs: Secondary | ICD-10-CM | POA: Insufficient documentation

## 2013-08-22 DIAGNOSIS — Z79899 Other long term (current) drug therapy: Secondary | ICD-10-CM | POA: Insufficient documentation

## 2013-08-22 NOTE — ED Notes (Signed)
Unresolved lower back pain since Saturday while driving. Took. 1.5 percocet ~ 2345. Also prescribed flexeril and prednisone.

## 2013-08-23 ENCOUNTER — Emergency Department (HOSPITAL_COMMUNITY)
Admission: EM | Admit: 2013-08-23 | Discharge: 2013-08-23 | Disposition: A | Discharge status: Home / Self Care | Payer: BC Managed Care – PPO | Attending: Emergency Medicine | Admitting: Emergency Medicine

## 2013-08-23 ENCOUNTER — Encounter (HOSPITAL_COMMUNITY): Payer: Self-pay | Admitting: Emergency Medicine

## 2013-08-23 DIAGNOSIS — M5416 Radiculopathy, lumbar region: Secondary | ICD-10-CM

## 2013-08-23 MED ORDER — KETOROLAC TROMETHAMINE 60 MG/2ML IM SOLN
60.0000 mg | Freq: Once | INTRAMUSCULAR | Status: AC
  Administered 2013-08-23: 60 mg via INTRAMUSCULAR
  Filled 2013-08-23: qty 2

## 2013-08-23 MED ORDER — HYDROCODONE-ACETAMINOPHEN 5-325 MG PO TABS: 1.0000 | ORAL_TABLET | Freq: Four times a day (QID) | ORAL | Status: DC | PRN

## 2013-08-23 NOTE — ED Provider Notes (Signed)
Medical screening examination/treatment/procedure(s) were performed by non-physician practitioner and as supervising physician I was immediately available for consultation/collaboration.   Sierrah Luevano M Berle Fitz, MD 08/23/13 2213 

## 2013-08-23 NOTE — ED Provider Notes (Signed)
CSN: [id number]     Arrival date & time 08/22/13  2352 History   First MD Initiated Contact with Patient 08/23/13 0132     Chief Complaint  Patient presents with  . Back Pain     (Consider location/radiation/quality/duration/timing/severity/associated sxs/prior Treatment) Patient is a 53 y.o. female presenting with back pain. The history is provided by the patient and medical records. No language interpreter was used.  Back Pain Location:  Lumbar spine Quality:  Stabbing and shooting Radiates to:  R thigh Pain severity:  Severe Pain is:  Same all the time Onset quality:  Gradual Timing:  Constant Progression:  Worsening Chronicity:  Recurrent Ineffective treatments:  Narcotics and muscle relaxants Associated symptoms: leg pain   Associated symptoms: no bladder incontinence, no bowel incontinence, no dysuria, no numbness, no paresthesias, no perianal numbness and no weakness     Past Medical History  Diagnosis Date  . Hypertension   . Anxiety   . Ear infection     recurrent   Past Surgical History  Procedure Laterality Date  . Tubal ligation      bilateral  . Tonsillectomy     Family History  Problem Relation Age of Onset  . Hypertension Mother     alive  . Lung cancer Father     deceased  . Other      1 brother and 1 sister both alive an healthy   History  Substance Use Topics  . Smoking status: Former Smoker    Types: Cigarettes    Quit date: 04/20/1985  . Smokeless tobacco: Not on file     Comment: remote tobacco use   . Alcohol Use: No   OB History   Grav Para Term Preterm Abortions TAB SAB Ect Mult Living                 Review of Systems  Gastrointestinal: Negative for bowel incontinence.  Genitourinary: Negative for bladder incontinence and dysuria.  Musculoskeletal: Positive for back pain.  Neurological: Negative for weakness, numbness and paresthesias.  All other systems reviewed and are negative.     Allergies  Review of patient's  allergies indicates no known allergies.  Home Medications   Prior to Admission medications   Medication Sig Start Date End Date Taking? Authorizing Provider  cetirizine-pseudoephedrine (ZYRTEC-D) 5-120 MG per tablet Take 1 tablet by mouth daily. 02/08/12  Yes Jimmie MollyPaolo Coll, MD  clonazePAM (KLONOPIN) 0.5 MG tablet Take 0.5 mg by mouth 3 (three) times daily as needed for anxiety.    Yes Historical Provider, MD  cyclobenzaprine (FLEXERIL) 10 MG tablet Take 1 tablet (10 mg total) by mouth 3 (three) times daily as needed for muscle spasms. 08/19/13  Yes Peter S Dammen, PA-C  lisinopril-hydrochlorothiazide (PRINZIDE,ZESTORETIC) 10-12.5 MG per tablet Take 1 tablet by mouth daily.     Yes Historical Provider, MD  mometasone (NASONEX) 50 MCG/ACT nasal spray Place 2 sprays into the nose daily. As directed   Yes Historical Provider, MD  oxyCODONE-acetaminophen (PERCOCET/ROXICET) 5-325 MG per tablet Take 1-2 tablets by mouth every 6 (six) hours as needed for severe pain. 08/19/13  Yes Peter S Dammen, PA-C  predniSONE (DELTASONE) 10 MG tablet Take 2 tablets (20 mg total) by mouth daily. 08/19/13  Yes Peter S Dammen, PA-C   BP 120/79  Pulse 90  Temp(Src) 97.7 F (36.5 C) (Oral)  Resp 14  SpO2 98%  LMP 08/18/2013 Physical Exam  Nursing note and vitals reviewed. Constitutional: She is oriented to person, place, and  time. She appears well-developed and well-nourished.  HENT:  Head: Normocephalic and atraumatic.  Eyes: Pupils are equal, round, and reactive to light.  Neck: Normal range of motion.  Cardiovascular: Normal rate and regular rhythm.   Pulmonary/Chest: Effort normal and breath sounds normal.  Abdominal: Soft. Bowel sounds are normal.  Musculoskeletal: Normal range of motion.       Back:  Lymphadenopathy:    She has no cervical adenopathy.  Neurological: She is alert and oriented to person, place, and time. No cranial nerve deficit. Coordination normal.  Skin: Skin is warm and dry.  Psychiatric:  She has a normal mood and affect. Her behavior is normal. Judgment and thought content normal.    ED Course  Procedures (including critical care time) Labs Review Labs Reviewed - No data to display  Imaging Review No results found.   EKG Interpretation None      MDM   Final diagnoses:  None    Patient seen and evaluated for continuation of back pain that was previously addressed over the weekend.  Patient says her pain is continuing, without relief with medications .  No red flag symptoms for cauda equina.  Patient states she has been unable to schedule an appointment with Dr. Yetta BarreJones.  She does not currently have a PCP, but is insured.  Toradol given in ED, and pain medication changed to hydrocodone.  Patient to continue with muscle relaxant and steroids.  Outpatient MRI requested/ordered.    Jimmye Normanavid John Diasia Henken, NP 08/23/13 [phone]

## 2013-08-23 NOTE — Discharge Instructions (Signed)

## 2013-08-23 NOTE — ED Provider Notes (Signed)
Medical screening examination/treatment/procedure(s) were performed by non-physician practitioner and as supervising physician I was immediately available for consultation/collaboration.   Timaya Bojarski, MD 08/23/13 0733 

## 2013-08-30 ENCOUNTER — Ambulatory Visit (HOSPITAL_COMMUNITY): Admission: RE | Admit: 2013-08-30 | Payer: BC Managed Care – PPO | Source: Ambulatory Visit

## 2013-09-19 ENCOUNTER — Other Ambulatory Visit: Payer: Self-pay | Admitting: Neurological Surgery

## 2013-09-19 DIAGNOSIS — M549 Dorsalgia, unspecified: Secondary | ICD-10-CM

## 2013-09-26 ENCOUNTER — Other Ambulatory Visit: Payer: BC Managed Care – PPO

## 2013-10-01 ENCOUNTER — Ambulatory Visit
Admission: RE | Admit: 2013-10-01 | Discharge: 2013-10-01 | Disposition: A | Discharge status: Home / Self Care | Payer: BC Managed Care – PPO | Source: Ambulatory Visit | Attending: Neurological Surgery | Admitting: Neurological Surgery

## 2013-10-01 DIAGNOSIS — M549 Dorsalgia, unspecified: Secondary | ICD-10-CM

## 2013-12-19 ENCOUNTER — Ambulatory Visit: Payer: BC Managed Care – PPO | Admitting: Internal Medicine

## 2014-04-26 ENCOUNTER — Emergency Department (HOSPITAL_COMMUNITY)
Admission: EM | Admit: 2014-04-26 | Discharge: 2014-04-26 | Disposition: A | Discharge status: Home / Self Care | Payer: 59 | Attending: Emergency Medicine | Admitting: Emergency Medicine

## 2014-04-26 ENCOUNTER — Encounter (HOSPITAL_COMMUNITY): Payer: Self-pay | Admitting: Emergency Medicine

## 2014-04-26 DIAGNOSIS — M5416 Radiculopathy, lumbar region: Secondary | ICD-10-CM

## 2014-04-26 DIAGNOSIS — I10 Essential (primary) hypertension: Secondary | ICD-10-CM | POA: Insufficient documentation

## 2014-04-26 DIAGNOSIS — M545 Low back pain: Secondary | ICD-10-CM | POA: Diagnosis present

## 2014-04-26 DIAGNOSIS — Z8659 Personal history of other mental and behavioral disorders: Secondary | ICD-10-CM | POA: Diagnosis not present

## 2014-04-26 DIAGNOSIS — Z87891 Personal history of nicotine dependence: Secondary | ICD-10-CM | POA: Diagnosis not present

## 2014-04-26 DIAGNOSIS — Z7951 Long term (current) use of inhaled steroids: Secondary | ICD-10-CM | POA: Diagnosis not present

## 2014-04-26 DIAGNOSIS — Z79899 Other long term (current) drug therapy: Secondary | ICD-10-CM | POA: Diagnosis not present

## 2014-04-26 DIAGNOSIS — Z7952 Long term (current) use of systemic steroids: Secondary | ICD-10-CM | POA: Diagnosis not present

## 2014-04-26 DIAGNOSIS — Z8669 Personal history of other diseases of the nervous system and sense organs: Secondary | ICD-10-CM | POA: Insufficient documentation

## 2014-04-26 MED ORDER — PREDNISONE 50 MG PO TABS: ORAL_TABLET | ORAL | Status: DC

## 2014-04-26 MED ORDER — MORPHINE SULFATE 4 MG/ML IJ SOLN
4.0000 mg | Freq: Once | INTRAMUSCULAR | Status: AC
  Administered 2014-04-26: 4 mg via INTRAMUSCULAR
  Filled 2014-04-26: qty 1

## 2014-04-26 MED ORDER — OXYCODONE-ACETAMINOPHEN 5-325 MG PO TABS: ORAL_TABLET | ORAL | Status: DC

## 2014-04-26 MED ORDER — DIAZEPAM 5 MG PO TABS: 5.0000 mg | ORAL_TABLET | Freq: Four times a day (QID) | ORAL | Status: DC | PRN

## 2014-04-26 NOTE — Discharge Instructions (Signed)
Please take ibuprofen 400mg (this is normally 2 over the counter pills) every 6 hours (take with food to minimze stomach irritation).  ° °Take valium and/or percocet for breakthrough pain, do not drink alcohol, drive, care for children or perfom other critical tasks while taking valium and/or percocet. ° °Please follow with your primary care doctor in the next 2 days for a check-up. They must obtain records for further management.  ° °Do not hesitate to return to the Emergency Department for any new, worsening or concerning symptoms.  ° ° °

## 2014-04-26 NOTE — ED Provider Notes (Signed)
CSN: [id number]     Arrival date & time 04/26/14  1648 History   First MD Initiated Contact with Patient 04/26/14 1858    This chart was scribed for non-physician practitioner, Wynetta EmeryNicole Thiago Ragsdale, PA, working with Rolland PorterMark James, MD by Marica OtterNusrat Rahman, ED Scribe. This patient was seen in room WTR9/WTR9 and the patient's care was started at 7:00 PM.  Chief Complaint  Patient presents with  . Back Pain  . Nerve Pain    The history is provided by the patient. No language interpreter was used.   PCP: Nicki ReaperBAITY, REGINA, NP HPI Comments: Diana [name] is a 54 y.o. female, with medical Hx noted below including two ruptured discs found via MRI done in 09/2013, who presents to the Emergency Department complaining of atraumatic, acute, worsening, radiating left, lower back pain radiating down her left leg onset 6 months ago and worsening last night. Pt rates her present pain a 10 out of 10. Pt notes that her pain is exacerbated with weight; pt notes she has been using a cane to ambulate, pt specifies she does not use a cane at baseline. Pt reports she has been taking Advil at home with no relief. Pt denies Hx of cancer, IV drug use, fever, urinary Sx, bowel incontinence, urinary incontinence. Pt notes she has an appointment with neurosurgery on 05/18/14. Pt denies any medicinal allergies and reports she has a ride home.    Past Medical History  Diagnosis Date  . Hypertension   . Anxiety   . Ear infection     recurrent   Past Surgical History  Procedure Laterality Date  . Tubal ligation      bilateral  . Tonsillectomy     Family History  Problem Relation Age of Onset  . Hypertension Mother     alive  . Lung cancer Father     deceased  . Other      1 brother and 1 sister both alive an healthy   History  Substance Use Topics  . Smoking status: Former Smoker    Types: Cigarettes    Quit date: 04/20/1985  . Smokeless tobacco: Not on file     Comment: remote tobacco use   . Alcohol Use: No   OB  History    No data available     Review of Systems  Constitutional: Negative for fever and chills.  Gastrointestinal: Negative for constipation.  Genitourinary: Negative for difficulty urinating.  Musculoskeletal: Positive for back pain (left, lower back pain radiating down left leg).  Psychiatric/Behavioral: Negative for confusion.  All other systems reviewed and are negative.     Allergies  Review of patient's allergies indicates no known allergies.  Home Medications   Prior to Admission medications   Medication Sig Start Date End Date Taking? Authorizing Provider  cetirizine-pseudoephedrine (ZYRTEC-D) 5-120 MG per tablet Take 1 tablet by mouth daily. 02/08/12   Jimmie MollyPaolo Coll, MD  clonazePAM (KLONOPIN) 0.5 MG tablet Take 0.5 mg by mouth 3 (three) times daily as needed for anxiety.     Historical Provider, MD  cyclobenzaprine (FLEXERIL) 10 MG tablet Take 1 tablet (10 mg total) by mouth 3 (three) times daily as needed for muscle spasms. 08/19/13   Angus SellerPeter S Dammen, PA-C  HYDROcodone-acetaminophen (NORCO/VICODIN) 5-325 MG per tablet Take 1-2 tablets by mouth every 6 (six) hours as needed for severe pain. 08/23/13   Jimmye Normanavid John Smith, NP  lisinopril-hydrochlorothiazide (PRINZIDE,ZESTORETIC) 10-12.5 MG per tablet Take 1 tablet by mouth daily.  Historical Provider, MD  mometasone (NASONEX) 50 MCG/ACT nasal spray Place 2 sprays into the nose daily. As directed    Historical Provider, MD  oxyCODONE-acetaminophen (PERCOCET/ROXICET) 5-325 MG per tablet Take 1-2 tablets by mouth every 6 (six) hours as needed for severe pain. 08/19/13   Phill Mutter Dammen, PA-C  predniSONE (DELTASONE) 10 MG tablet Take 2 tablets (20 mg total) by mouth daily. 08/19/13   Angus Seller, PA-C   Triage Vitals: BP 172/77 mmHg  Pulse 112  Temp(Src) 97.4 F (36.3 C) (Oral)  Resp 15  SpO2 100%  LMP 04/15/2014 Physical Exam  Constitutional: She is oriented to person, place, and time. She appears well-developed and  well-nourished. No distress.  HENT:  Head: Normocephalic and atraumatic.  Eyes: Conjunctivae and EOM are normal.  Neck: Neck supple. No tracheal deviation present.  Cardiovascular: Normal rate.   Pulmonary/Chest: Effort normal. No respiratory distress.  Musculoskeletal: Normal range of motion.  Neurological: She is alert and oriented to person, place, and time.  No point tenderness to percussion of lumbar spinal processes.  No TTP or paraspinal muscular spasm. Strength is 5 out of 5 to bilateral lower extremities at hip and knee; extensor hallucis longus 5 out of 5. Ankle strength 5 out of 5, no clonus, neurovascularly intact. No saddle anaesthesia. Patellar reflexes are 2+ bilaterally.    Straight leg raise on left side is positive at 45 degrees.  Straight leg raise on right is negative.   Skin: Skin is warm and dry.  Psychiatric: She has a normal mood and affect. Her behavior is normal.  Nursing note and vitals reviewed.   ED Course  Procedures (including critical care time) DIAGNOSTIC STUDIES: Oxygen Saturation is 100% on RA, Normal by my interpretation.    COORDINATION OF CARE: 7:06 PM-Discussed treatment plan which includes meds with pt at bedside and pt agreed to plan. Pt also offered crutches, however, pt declined crutches at this time.   Labs Review Labs Reviewed - No data to display  Imaging Review No results found.   EKG Interpretation None      MDM   Final diagnoses:  Lumbar radicular pain     Medications  morphine 4 MG/ML injection 4 mg (4 mg Intramuscular Given 04/26/14 1913)    [name] is a pleasant 54 y.o. female presenting with  back pain.  No neurological deficits and normal neuro exam.  Patient can walk but states is painful.  No loss of bowel or bladder control.  No concern for cauda equina.  No fever, night sweats, weight loss, h/o cancer, IVDU.  RICE protocol and pain medicine indicated and discussed with patient.  Evaluation does not show  pathology that would require ongoing emergent intervention or inpatient treatment. Pt is hemodynamically stable and mentating appropriately. Discussed findings and plan with patient/guardian, who agrees with care plan. All questions answered. Return precautions discussed and outpatient follow up given.   Discharge Medication List as of 04/26/2014  7:23 PM    START taking these medications   Details  diazepam (VALIUM) 5 MG tablet Take 1 tablet (5 mg total) by mouth every 6 (six) hours as needed for anxiety (spasms)., Starting 04/26/2014, Until Discontinued, Print        I personally performed the services described in this documentation, which was scribed in my presence. The recorded information has been reviewed and is accurate.      Wynetta Emery, PA-C 04/26/14 1954  Rolland Porter, MD 05/08/14 0001

## 2014-04-26 NOTE — ED Notes (Signed)
Patient reports left lower back pain radiating to left buttock and down left leg. Had MRI done in June and this showed two ruptured discs. Says pain was alleviated at this point so for the past 6 months pain has been alleviated. Patient went to bend over last night and felt a new left buttock pain radiating down left leg. Using a cane to walk. Called PCP and neurologist and was unable to get an appointment until 1/29. Patient is now only taking HCTZ but PCP took off Lisinopril. A&Ox4. Ambulatory. RR even/unlabored. Took three Advil tablets around 1415.

## 2014-04-29 ENCOUNTER — Emergency Department (HOSPITAL_COMMUNITY)
Admission: EM | Admit: 2014-04-29 | Discharge: 2014-04-29 | Disposition: A | Discharge status: Home / Self Care | Payer: 59 | Attending: Emergency Medicine | Admitting: Emergency Medicine

## 2014-04-29 ENCOUNTER — Encounter (HOSPITAL_COMMUNITY): Payer: Self-pay

## 2014-04-29 DIAGNOSIS — I10 Essential (primary) hypertension: Secondary | ICD-10-CM | POA: Insufficient documentation

## 2014-04-29 DIAGNOSIS — Z79899 Other long term (current) drug therapy: Secondary | ICD-10-CM | POA: Diagnosis not present

## 2014-04-29 DIAGNOSIS — M545 Low back pain: Secondary | ICD-10-CM | POA: Insufficient documentation

## 2014-04-29 DIAGNOSIS — Z8669 Personal history of other diseases of the nervous system and sense organs: Secondary | ICD-10-CM | POA: Diagnosis not present

## 2014-04-29 DIAGNOSIS — M549 Dorsalgia, unspecified: Secondary | ICD-10-CM | POA: Diagnosis present

## 2014-04-29 DIAGNOSIS — Z87891 Personal history of nicotine dependence: Secondary | ICD-10-CM | POA: Insufficient documentation

## 2014-04-29 DIAGNOSIS — F419 Anxiety disorder, unspecified: Secondary | ICD-10-CM | POA: Insufficient documentation

## 2014-04-29 DIAGNOSIS — Z7951 Long term (current) use of inhaled steroids: Secondary | ICD-10-CM | POA: Insufficient documentation

## 2014-04-29 MED ORDER — HYDROMORPHONE HCL 1 MG/ML IJ SOLN
1.0000 mg | Freq: Once | INTRAMUSCULAR | Status: AC
  Administered 2014-04-29: 1 mg via INTRAMUSCULAR
  Filled 2014-04-29: qty 1

## 2014-04-29 MED ORDER — OXYCODONE HCL 7.5 MG PO TABS: 1.0000 | ORAL_TABLET | Freq: Four times a day (QID) | ORAL | Status: DC | PRN

## 2014-04-29 MED ORDER — METHOCARBAMOL 500 MG PO TABS: 500.0000 mg | ORAL_TABLET | Freq: Two times a day (BID) | ORAL | Status: DC

## 2014-04-29 NOTE — Discharge Instructions (Signed)
Take the prescribed medication as directed. Follow-up with your primary care physician if continued pain prior to your neurosurgery appt on 05/18/13. Return to the ED for new concerns.

## 2014-04-29 NOTE — ED Provider Notes (Signed)
CSN: [id number]     Arrival date & time 04/29/14  1223 History  This chart was scribed for non-physician practitioner Sharilyn SitesLisa Rejeana Fadness, PA-C working with Rolan BuccoMelanie Belfi, MD by Littie Deedsichard Sun, ED Scribe. This patient was seen in room WTR6/WTR6 and the patient's care was started at 12:44 PM.      Chief Complaint  Patient presents with  . Back Pain    The history is provided by the patient. No language interpreter was used.   HPI Comments: Diana Postinnita F Jourdan is a 54 y.o. female with a hx of 2 ruptured discs who presents to the Emergency Department complaining of constant, throbbing back pain radiating down her left leg that started a few days ago. Patient was recently seen here for back pain 3 days ago and states she was diagnosed with sciatica; her pain has not improved since she was seen. She has been taking the medications she was given without relief. Sitting and laying down also do not provide relief.  She also reports difficulty ambulating. Patient did have severe back pain in May 2015, for which she was seen in the ED 3 times. She was then referred to neurosurgery, where she had an MRI done which revealed 2 ruptured discs. However, the pain seemed to resolve on its own so nothing further was done at the time, but she was instructed to call if her back pain returned or worsened. Since her back pain returned, she has called the neurosurgery office, but she cannot get in until Jan. 29th and was told to come back to the ED.  No numbness, paresthesias or weakness of extremities.  No loss of bowel or bladder control.  No fever, chills, sweats.  Past Medical History  Diagnosis Date  . Hypertension   . Anxiety   . Ear infection     recurrent   Past Surgical History  Procedure Laterality Date  . Tubal ligation      bilateral  . Tonsillectomy     Family History  Problem Relation Age of Onset  . Hypertension Mother     alive  . Lung cancer Father     deceased  . Other      1 brother and 1 sister both  alive an healthy   History  Substance Use Topics  . Smoking status: Former Smoker    Types: Cigarettes    Quit date: 04/20/1985  . Smokeless tobacco: Not on file     Comment: remote tobacco use   . Alcohol Use: No   OB History    No data available     Review of Systems  Musculoskeletal: Positive for back pain.  All other systems reviewed and are negative.     Allergies  Review of patient's allergies indicates no known allergies.  Home Medications   Prior to Admission medications   Medication Sig Start Date End Date Taking? Authorizing Provider  cetirizine-pseudoephedrine (ZYRTEC-D) 5-120 MG per tablet Take 1 tablet by mouth daily. 02/08/12   Jimmie MollyPaolo Coll, MD  clonazePAM (KLONOPIN) 0.5 MG tablet Take 0.5 mg by mouth 3 (three) times daily as needed for anxiety.     Historical Provider, MD  cyclobenzaprine (FLEXERIL) 10 MG tablet Take 1 tablet (10 mg total) by mouth 3 (three) times daily as needed for muscle spasms. 08/19/13   Phill MutterPeter S Dammen, PA-C  diazepam (VALIUM) 5 MG tablet Take 1 tablet (5 mg total) by mouth every 6 (six) hours as needed for anxiety (spasms). 04/26/14   Wynetta EmeryNicole Pisciotta, PA-C  HYDROcodone-acetaminophen (NORCO/VICODIN) 5-325 MG per tablet Take 1-2 tablets by mouth every 6 (six) hours as needed for severe pain. 08/23/13   Jimmye Norman, NP  lisinopril-hydrochlorothiazide (PRINZIDE,ZESTORETIC) 10-12.5 MG per tablet Take 1 tablet by mouth daily.      Historical Provider, MD  mometasone (NASONEX) 50 MCG/ACT nasal spray Place 2 sprays into the nose daily. As directed    Historical Provider, MD  oxyCODONE-acetaminophen (PERCOCET/ROXICET) 5-325 MG per tablet 1 to 2 tabs PO q6hrs  PRN for pain 04/26/14   Joni Reining Pisciotta, PA-C  predniSONE (DELTASONE) 50 MG tablet Take 1 tablet daily with breakfast 04/26/14   Joni Reining Pisciotta, PA-C   BP 143/77 mmHg  Pulse 85  Temp(Src) 97.9 F (36.6 C) (Oral)  Resp 16  SpO2 100%  LMP 04/15/2014   Physical Exam  Constitutional: She is  oriented to person, place, and time. She appears well-developed and well-nourished.  HENT:  Head: Normocephalic and atraumatic.  Mouth/Throat: Oropharynx is clear and moist.  Eyes: Conjunctivae and EOM are normal. Pupils are equal, round, and reactive to light.  Neck: Normal range of motion.  Cardiovascular: Normal rate, regular rhythm and normal heart sounds.   Pulmonary/Chest: Effort normal and breath sounds normal.  Musculoskeletal: Normal range of motion. She exhibits no edema.       Back:  Tenderness of left SI joint; no midline deformities noted; + SLR on left; full ROM maintained but with some pain; normal strength and sensation of BLE  Neurological: She is alert and oriented to person, place, and time.  Skin: Skin is warm and dry.  Psychiatric: She has a normal mood and affect.  Nursing note and vitals reviewed.   ED Course  Procedures  DIAGNOSTIC STUDIES: Oxygen Saturation is 100% on room air, normal by my interpretation.    COORDINATION OF CARE: 12:50 PM-Discussed treatment plan which includes medications with pt at bedside and pt agreed to plan.    Labs Review Labs Reviewed - No data to display  Imaging Review No results found.   EKG Interpretation None      MDM   Final diagnoses:  Back pain, unspecified location   54 y.o. F with back pain, likely lumbar radiculopathy/sciatica.  No red flag symptoms or focal neurologic deficits on exam today.  Patient given dilaudid in the ED, d/c home with percocet and robaxin.  Encouraged to FU with her PCP if continue having symptoms prior to neurosurgery appt.  Discussed plan with patient, he/she acknowledged understanding and agreed with plan of care.  Return precautions given for new or worsening symptoms.  I personally performed the services described in this documentation, which was scribed in my presence. The recorded information has been reviewed and is accurate.  Garlon Hatchet, PA-C 04/29/14 1348  Rolan Bucco, MD 04/29/14 [phone]

## 2014-04-29 NOTE — ED Notes (Signed)
Pt states seen here for back pain on Thursday.  Pain has been there for almost a week.  meds not helping.  Difficulty walking. No change in urination.

## 2014-05-10 ENCOUNTER — Other Ambulatory Visit: Payer: Self-pay | Admitting: Neurological Surgery

## 2014-05-10 DIAGNOSIS — M5416 Radiculopathy, lumbar region: Secondary | ICD-10-CM

## 2014-05-14 ENCOUNTER — Ambulatory Visit
Admission: RE | Admit: 2014-05-14 | Discharge: 2014-05-14 | Disposition: A | Discharge status: Home / Self Care | Payer: 59 | Source: Ambulatory Visit | Attending: Neurological Surgery | Admitting: Neurological Surgery

## 2014-05-14 DIAGNOSIS — M5416 Radiculopathy, lumbar region: Secondary | ICD-10-CM

## 2015-03-07 ENCOUNTER — Ambulatory Visit (INDEPENDENT_AMBULATORY_CARE_PROVIDER_SITE_OTHER): Payer: 59 | Admitting: Internal Medicine

## 2015-03-07 ENCOUNTER — Other Ambulatory Visit: Payer: Self-pay | Admitting: Internal Medicine

## 2015-03-07 ENCOUNTER — Encounter: Payer: Self-pay | Admitting: Internal Medicine

## 2015-03-07 VITALS — BP 144/96 | HR 66 | Temp 97.8°F | Ht 67.0 in | Wt 228.0 lb

## 2015-03-07 DIAGNOSIS — J302 Other seasonal allergic rhinitis: Secondary | ICD-10-CM | POA: Diagnosis not present

## 2015-03-07 DIAGNOSIS — M5126 Other intervertebral disc displacement, lumbar region: Secondary | ICD-10-CM | POA: Insufficient documentation

## 2015-03-07 DIAGNOSIS — I10 Essential (primary) hypertension: Secondary | ICD-10-CM | POA: Diagnosis not present

## 2015-03-07 DIAGNOSIS — F411 Generalized anxiety disorder: Secondary | ICD-10-CM

## 2015-03-07 LAB — BASIC METABOLIC PANEL
BUN: 9 mg/dL (ref 6–23)
CALCIUM: 9.4 mg/dL (ref 8.4–10.5)
CHLORIDE: 104 meq/L (ref 96–112)
CO2: 30 meq/L (ref 19–32)
Creatinine, Ser: 0.62 mg/dL (ref 0.40–1.20)
GFR: 107.32 mL/min (ref >60.00)
GLUCOSE: 101 mg/dL — AB (ref 70–99)
Potassium: 3.9 mEq/L (ref 3.5–5.1)
SODIUM: 141 meq/L (ref 135–145)

## 2015-03-07 MED ORDER — HYDROCHLOROTHIAZIDE 25 MG PO TABS: 25.0000 mg | ORAL_TABLET | Freq: Every day | ORAL | Status: DC

## 2015-03-07 NOTE — Assessment & Plan Note (Signed)
Will check BMET today If creatinine WNL, start HCTZ 25 mg daily  RTC in 1 month to recheck BP

## 2015-03-07 NOTE — Assessment & Plan Note (Signed)
Continue Xyzal and Nasocort at this time

## 2015-03-07 NOTE — Progress Notes (Signed)
HPI  Pt presents to the clinic today to establish care and for management of the conditions listed below. She is transferring care from Dr. Rex Kras in [place].  HTN: She has been on multiple medication is the past, most recently HCTZ. She reports she has been out of her medication x 3 weeks. She denies chest pain, chest tightness or shortness of breath. Her BP today is 144/96.  Anxiety: Chronic but stable. She takes Clonazepam only a few times per month.  Seasonal Allergies: She is currently taking Xyzal x 1 month, then she will return to taking Zyrtec daily. She uses Nasocort OTC as well.  Ruptured Disc in Lumbar Spine: She reports she was advised that she needed surgery but she is not interested in any surgical intervention at this time.   Flu: never Tetanus: unsure Pap Smear: 08/2014 Mammogram: She thinks it was in 2013 at [facility] Colon Screening: 2000 Vision Screening: yearly  Dentist: as needed  Past Medical History  Diagnosis Date  . Hypertension   . Anxiety   . Ear infection     recurrent  . Allergy     Current Outpatient Prescriptions  Medication Sig Dispense Refill  . cetirizine (ZYRTEC) 10 MG tablet Take 10 mg by mouth daily.    . clonazePAM (KLONOPIN) 0.5 MG tablet Take 0.5 mg by mouth as needed.     Marland Kitchen levocetirizine (XYZAL) 5 MG tablet Take 5 mg by mouth every evening.     . triamcinolone (NASACORT ALLERGY 24HR) 55 MCG/ACT AERO nasal inhaler Place 2 sprays into the nose daily.    . Vitamin D, Ergocalciferol, (DRISDOL) 50000 UNITS CAPS capsule Take 50,000 Units by mouth every 7 (seven) days.   2  . hydrochlorothiazide (MICROZIDE) 12.5 MG capsule Take 12.5 mg by mouth daily.   4  . lisinopril (PRINIVIL,ZESTRIL) 10 MG tablet Take 10 mg by mouth daily.      No current facility-administered medications for this visit.    No Known Allergies  Family History  Problem Relation Age of Onset  . Hypertension Mother     alive  . Arthritis Mother   . Lung  cancer Father     deceased  . Heart disease Father   . Other      1 brother and 1 sister both alive an healthy  . Hypertension Maternal Grandmother     Social History   Social History  . Marital Status: Married    Spouse Name: N/A  . Number of Children: 3  . Years of Education: N/A   Occupational History  . customer service [employer]   Social History Main Topics  . Smoking status: Former Smoker    Types: Cigarettes    Quit date: 04/20/1985  . Smokeless tobacco: Not on file     Comment: remote tobacco use   . Alcohol Use: No  . Drug Use: No  . Sexual Activity: Not on file   Other Topics Concern  . Not on file   Social History Narrative    ROS:  Constitutional: Denies fever, malaise, fatigue, headache or abrupt weight changes.  HEENT: Denies eye pain, eye redness, ear pain, ringing in the ears, wax buildup, runny nose, nasal congestion, bloody nose, or sore throat. Respiratory: Denies difficulty breathing, shortness of breath, cough or sputum production.   Cardiovascular: Denies chest pain, chest tightness, palpitations or swelling in the hands or feet.  Musculoskeletal: Pt reports back pain. Denies difficulty with gait, muscle pain or joint swelling.  Skin: Denies redness, rashes, lesions or ulcercations.  Neurological: Denies dizziness, difficulty with memory, difficulty with speech or problems with balance and coordination.  Psych: Denies anxiety, depression, SI/HI.  No other specific complaints in a complete review of systems (except as listed in HPI above).  PE:  BP 144/96 mmHg  Pulse 66  Temp(Src) 97.8 F (36.6 C) (Oral)  Ht 5' 7" (1.702 m)  Wt 228 lb (103.42 kg)  BMI 35.70 kg/m2  SpO2 98%  LMP 02/26/2015 Wt Readings from Last 3 Encounters:  03/07/15 228 lb (103.42 kg)  05/14/14 204 lb (92.534 kg)  07/04/10 243 lb 8 oz (110.451 kg)    General: Appears her stated age, obese in NAD. HEENT: Head: normal shape and size; Eyes: sclera white, no  icterus, conjunctiva pink, PERRLA and EOMs intact; Ears: Tm's gray and intact, normal light reflex;Throat/Mouth: Teeth present, mucosa pink and moist, no lesions or ulcerations noted.   Cardiovascular: Normal rate and rhythm. S1,S2 noted.  No murmur, rubs or gallops noted. Pulmonary/Chest: Normal effort and positive vesicular breath sounds. No respiratory distress. No wheezes, rales or ronchi noted.  Musculoskeletal: Decreased flexion of the spine. Normal extension and rotation. Pain with palpation over the lumbar spine. No difficulty with gait.  Neurological: Alert and oriented. Psychiatric: Mood and affect normal. Behavior is normal. Judgment and thought content normal.    BMET    Component Value Date/Time   NA 137 07/18/2010 0500   K 3.5 07/18/2010 0500   CL 104 07/18/2010 0500   CO2 26 07/18/2010 0500   GLUCOSE 113* 07/18/2010 0500   BUN 7 07/18/2010 0500   CREATININE 0.56 07/18/2010 0500   CALCIUM 8.5 07/18/2010 0500   GFRNONAA >60 07/18/2010 0500   GFRAA  07/18/2010 0500    >60        The eGFR has been calculated using the MDRD equation. This calculation has not been validated in all clinical situations. eGFR's persistently <60 mL/min signify possible Chronic Kidney Disease.    Lipid Panel     Component Value Date/Time   CHOL  07/18/2010 0500    163 (NOTE) ATP III Classification:      < 200        mg/dL        Desirable     200 - 239     mg/dL        Borderline High     >= 240        mg/dL        High    TRIG 83 07/18/2010 0500   HDL 19* 07/18/2010 0500   CHOLHDL 8.6 07/18/2010 0500   VLDL 17 07/18/2010 0500   LDLCALC * 07/18/2010 0500    127 (NOTE)  Total Cholesterol/HDL Ratio:CHD Risk                       Coronary Heart Disease Risk Table                                       Men       Women         1/2 Average Risk              3.4        3.3             Average Risk  5.0         4.4          2X Average Risk              9.6        7.1          3X  Average Risk             23.4       11.0 Use the calculated Patient Ratio above and the CHD Risk table  to determine the patient's CHD Risk. ATP III Classification (LDL):      < 100         mg/dL         Optimal     100 - 129     mg/dL         Near or Above Optimal     130 - 159     mg/dL         Borderline High     160 - 189     mg/dL         High      > 190        mg/dL         Very High     CBC    Component Value Date/Time   WBC 6.5 07/18/2010 0500   RBC 3.89 07/18/2010 0500   HGB 9.8* 07/18/2010 0500   HCT 31.8* 07/18/2010 0500   PLT 254 07/18/2010 0500   MCV 81.7 07/18/2010 0500   MCH 25.2* 07/18/2010 0500   MCHC 30.8 07/18/2010 0500   RDW 17.5* 07/18/2010 0500   LYMPHSABS 1.3 07/17/2010 1050   MONOABS 0.4 07/17/2010 1050   EOSABS 0.1 07/17/2010 1050   BASOSABS 0.0 07/17/2010 1050    Hgb A1C Lab Results  Component Value Date   HGBA1C * 07/18/2010    6.4 (NOTE)                                                                       According to the ADA Clinical Practice Recommendations for 2011, when HbA1c is used as a screening test:   >=6.5%   Diagnostic of Diabetes Mellitus           (if abnormal result  is confirmed)  5.7-6.4%   Increased risk of developing Diabetes Mellitus  References:Diagnosis and Classification of Diabetes Mellitus,Diabetes VHQI,6962,95(MWUXL 1):S62-S69 and Standards of Medical Care in         Diabetes - 2011,Diabetes Care,2011,34  (Suppl 1):S11-S61.     Assessment and Plan:

## 2015-03-07 NOTE — Assessment & Plan Note (Signed)
Continue Klonipin prn Will discuss CSA and UDS at next visit

## 2015-03-07 NOTE — Progress Notes (Signed)
Pre visit review using our clinic review tool, if applicable. No additional management support is needed unless otherwise documented below in the visit note. 

## 2015-03-07 NOTE — Assessment & Plan Note (Signed)
She declines surgical intervention at this time

## 2015-03-07 NOTE — Patient Instructions (Signed)
Hypertension Hypertension, commonly called high blood pressure, is when the force of blood pumping through your arteries is too strong. Your arteries are the blood vessels that carry blood from your heart throughout your body. A blood pressure reading consists of a higher number over a lower number, such as 110/72. The higher number (systolic) is the pressure inside your arteries when your heart pumps. The lower number (diastolic) is the pressure inside your arteries when your heart relaxes. Ideally you want your blood pressure below 120/80. Hypertension forces your heart to work harder to pump blood. Your arteries may become narrow or stiff. Having untreated or uncontrolled hypertension can cause heart attack, stroke, kidney disease, and other problems. RISK FACTORS Some risk factors for high blood pressure are controllable. Others are not.  Risk factors you cannot control include:   Race. You may be at higher risk if you are African American.  Age. Risk increases with age.  Gender. Men are at higher risk than women before age 45 years. After age 65, women are at higher risk than men. Risk factors you can control include:  Not getting enough exercise or physical activity.  Being overweight.  Getting too much fat, sugar, calories, or salt in your diet.  Drinking too much alcohol. SIGNS AND SYMPTOMS Hypertension does not usually cause signs or symptoms. Extremely high blood pressure (hypertensive crisis) may cause headache, anxiety, shortness of breath, and nosebleed. DIAGNOSIS To check if you have hypertension, your health care provider will measure your blood pressure while you are seated, with your arm held at the level of your heart. It should be measured at least twice using the same arm. Certain conditions can cause a difference in blood pressure between your right and left arms. A blood pressure reading that is higher than normal on one occasion does not mean that you need treatment. If  it is not clear whether you have high blood pressure, you may be asked to return on a different day to have your blood pressure checked again. Or, you may be asked to monitor your blood pressure at home for 1 or more weeks. TREATMENT Treating high blood pressure includes making lifestyle changes and possibly taking medicine. Living a healthy lifestyle can help lower high blood pressure. You may need to change some of your habits. Lifestyle changes may include:  Following the DASH diet. This diet is high in fruits, vegetables, and whole grains. It is low in salt, red meat, and added sugars.  Keep your sodium intake below 2,300 mg per day.  Getting at least 30-45 minutes of aerobic exercise at least 4 times per week.  Losing weight if necessary.  Not smoking.  Limiting alcoholic beverages.  Learning ways to reduce stress. Your health care provider may prescribe medicine if lifestyle changes are not enough to get your blood pressure under control, and if one of the following is true:  You are 18-59 years of age and your systolic blood pressure is above 140.  You are 60 years of age or older, and your systolic blood pressure is above 150.  Your diastolic blood pressure is above 90.  You have diabetes, and your systolic blood pressure is over 140 or your diastolic blood pressure is over 90.  You have kidney disease and your blood pressure is above 140/90.  You have heart disease and your blood pressure is above 140/90. Your personal target blood pressure may vary depending on your medical conditions, your age, and other factors. HOME CARE INSTRUCTIONS    Have your blood pressure rechecked as directed by your health care provider.   Take medicines only as directed by your health care provider. Follow the directions carefully. Blood pressure medicines must be taken as prescribed. The medicine does not work as well when you skip doses. Skipping doses also puts you at risk for  problems.  Do not smoke.   Monitor your blood pressure at home as directed by your health care provider. SEEK MEDICAL CARE IF:   You think you are having a reaction to medicines taken.  You have recurrent headaches or feel dizzy.  You have swelling in your ankles.  You have trouble with your vision. SEEK IMMEDIATE MEDICAL CARE IF:  You develop a severe headache or confusion.  You have unusual weakness, numbness, or feel faint.  You have severe chest or abdominal pain.  You vomit repeatedly.  You have trouble breathing. MAKE SURE YOU:   Understand these instructions.  Will watch your condition.  Will get help right away if you are not doing well or get worse.   This information is not intended to replace advice given to you by your health care provider. Make sure you discuss any questions you have with your health care provider.   Document Released: 04/06/2005 Document Revised: 08/21/2014 Document Reviewed: 01/27/2013 Elsevier Interactive Patient Education 2016 [employer]  

## 2015-04-16 ENCOUNTER — Encounter: Payer: Self-pay | Admitting: Internal Medicine

## 2015-04-16 ENCOUNTER — Ambulatory Visit (INDEPENDENT_AMBULATORY_CARE_PROVIDER_SITE_OTHER): Payer: 59 | Admitting: Internal Medicine

## 2015-04-16 ENCOUNTER — Other Ambulatory Visit: Payer: Self-pay | Admitting: Internal Medicine

## 2015-04-16 VITALS — BP 142/84 | HR 71 | Temp 97.8°F | Ht 67.0 in | Wt 227.0 lb

## 2015-04-16 DIAGNOSIS — Z23 Encounter for immunization: Secondary | ICD-10-CM | POA: Diagnosis not present

## 2015-04-16 DIAGNOSIS — Z0000 Encounter for general adult medical examination without abnormal findings: Secondary | ICD-10-CM

## 2015-04-16 DIAGNOSIS — I10 Essential (primary) hypertension: Secondary | ICD-10-CM

## 2015-04-16 LAB — LIPID PANEL
CHOL/HDL RATIO: 10
CHOLESTEROL: 174 mg/dL (ref 0–200)
HDL: 18.2 mg/dL — ABNORMAL LOW (ref >39.00)
LDL Cholesterol: 141 mg/dL — ABNORMAL HIGH (ref 0–99)
NonHDL: 155.71
TRIGLYCERIDES: 76 mg/dL (ref 0.0–149.0)
VLDL: 15.2 mg/dL (ref 0.0–40.0)

## 2015-04-16 LAB — COMPREHENSIVE METABOLIC PANEL
ALBUMIN: 4.1 g/dL (ref 3.5–5.2)
ALT: 9 U/L (ref 0–35)
AST: 13 U/L (ref 0–37)
Alkaline Phosphatase: 70 U/L (ref 39–117)
BUN: 11 mg/dL (ref 6–23)
CALCIUM: 9.3 mg/dL (ref 8.4–10.5)
CHLORIDE: 101 meq/L (ref 96–112)
CO2: 30 mEq/L (ref 19–32)
CREATININE: 0.66 mg/dL (ref 0.40–1.20)
GFR: 99.81 mL/min (ref >60.00)
Glucose, Bld: 120 mg/dL — ABNORMAL HIGH (ref 70–99)
POTASSIUM: 3.9 meq/L (ref 3.5–5.1)
Sodium: 138 mEq/L (ref 135–145)
Total Bilirubin: 1 mg/dL (ref 0.2–1.2)
Total Protein: 6.9 g/dL (ref 6.0–8.3)

## 2015-04-16 LAB — HEMOGLOBIN A1C: HEMOGLOBIN A1C: 5.6 % (ref 4.6–6.5)

## 2015-04-16 LAB — CBC
HEMATOCRIT: 44.2 % (ref 36.0–46.0)
HEMOGLOBIN: 15 g/dL (ref 12.0–15.0)
MCHC: 33.9 g/dL (ref 30.0–36.0)
MCV: 90.6 fl (ref 78.0–100.0)
PLATELETS: 214 10*3/uL (ref 150.0–400.0)
RBC: 4.87 Mil/uL (ref 3.87–5.11)
RDW: 14.1 % (ref 11.5–15.5)
WBC: 5.2 10*3/uL (ref 4.0–10.5)

## 2015-04-16 MED ORDER — LISINOPRIL-HYDROCHLOROTHIAZIDE 10-12.5 MG PO TABS: 1.0000 | ORAL_TABLET | Freq: Every day | ORAL | Status: DC

## 2015-04-16 NOTE — Progress Notes (Signed)
Subjective:    Patient ID: Diana [name], female    DOB: January 26, 1961, 54 y.o.   MRN: [id number]  HPI  Pt presents to the clinic today for her annual exam. She is also due for 1 month follow up of HTN, see separate note.  Flu: never Tetanus: unsure Pap Smear: 08/2014 Mammogram: She thinks it was in 2013 at [facility] Colon Screening: 2000 Vision Screening: yearly   Dentist: as needed  Diet: She does eat meat. She eats veggies 2-3 x per week. She does not eat fruit at all. She does consume some fried food. She drinks mostly water. Exercise: None  Review of Systems      Past Medical History  Diagnosis Date  . Hypertension   . Anxiety   . Ear infection     recurrent  . Allergy     Current Outpatient Prescriptions  Medication Sig Dispense Refill  . cetirizine (ZYRTEC) 10 MG tablet Take 10 mg by mouth daily.    . clonazePAM (KLONOPIN) 0.5 MG tablet Take 0.5 mg by mouth as needed.     . hydrochlorothiazide (HYDRODIURIL) 25 MG tablet Take 1 tablet (25 mg total) by mouth daily. 30 tablet 1  . levocetirizine (XYZAL) 5 MG tablet Take 5 mg by mouth every evening.     . triamcinolone (NASACORT ALLERGY 24HR) 55 MCG/ACT AERO nasal inhaler Place 2 sprays into the nose daily.    . Vitamin D, Ergocalciferol, (DRISDOL) 50000 UNITS CAPS capsule Take 50,000 Units by mouth every 7 (seven) days.   2   No current facility-administered medications for this visit.    No Known Allergies  Family History  Problem Relation Age of Onset  . Hypertension Mother     alive  . Arthritis Mother   . Lung cancer Father     deceased  . Heart disease Father   . Other      1 brother and 1 sister both alive an healthy  . Hypertension Maternal Grandmother     Social History   Social History  . Marital Status: Married    Spouse Name: N/A  . Number of Children: 3  . Years of Education: N/A   Occupational History  . customer service [employer]   Social History Main Topics  .  Smoking status: Former Smoker    Types: Cigarettes    Quit date: 04/20/1985  . Smokeless tobacco: Not on file     Comment: remote tobacco use   . Alcohol Use: No  . Drug Use: No  . Sexual Activity: No   Other Topics Concern  . Not on file   Social History Narrative     Constitutional: Denies fever, malaise, fatigue, headache or abrupt weight changes.  HEENT: Denies eye pain, eye redness, ear pain, ringing in the ears, wax buildup, runny nose, nasal congestion, bloody nose, or sore throat. Respiratory: Denies difficulty breathing, shortness of breath, cough or sputum production.   Cardiovascular: Denies chest pain, chest tightness, palpitations or swelling in the hands or feet.  Gastrointestinal: Denies abdominal pain, bloating, constipation, diarrhea or blood in the stool.  GU: Denies urgency, frequency, pain with urination, burning sensation, blood in urine, odor or discharge. Musculoskeletal: Pt reports back pain. Denies decrease in range of motion, difficulty with gait, muscle pain or joint swelling.  Skin: Denies redness, rashes, lesions or ulcercations.  Neurological: Pt reports left leg numbness. Denies dizziness, difficulty with memory, difficulty with speech or problems with balance and coordination.  Psych: Pt reports mild anxiety. Denies depression, SI/HI.  No other specific complaints in a complete review of systems (except as listed in HPI above).  Objective:   Physical Exam  BP 142/84 mmHg  Pulse 71  Temp(Src) 97.8 F (36.6 C) (Oral)  Ht _0  (1.702 m)  Wt 227 lb (102.967 kg)  BMI 35.55 kg/m2  SpO2 98%  LMP 02/27/2015 Wt Readings from Last 3 Encounters:  04/16/15 227 lb (102.967 kg)  03/07/15 228 lb (103.42 kg)  05/14/14 204 lb (92.534 kg)    General: Appears her stated age, obese in NAD. Skin: Warm, dry and intact. Seborrheic keratosis noted on right breast. HEENT: Head: normal shape and size; Eyes: sclera white, no icterus, conjunctiva pink, PERRLA and  EOMs intact; Ears: Tm's gray and intact, normal light reflex;  Throat/Mouth: Teeth present, mucosa pink and moist, no exudate, lesions or ulcerations noted.  Neck:  Neck supple, trachea midline. No masses, lumps or thyromegaly present.  Cardiovascular: Normal rate and rhythm. S1,S2 noted.  No murmur, rubs or gallops noted. No JVD or BLE edema. No carotid bruits noted. Pulmonary/Chest: Normal effort and positive vesicular breath sounds. No respiratory distress. No wheezes, rales or ronchi noted.  Abdomen: Soft and nontender. Normal bowel sounds No distention or masses noted. Liver, spleen and kidneys non palpable. Musculoskeletal: Decreased flexion of the spine. Normal extension and rotation. Pain with palpation over the lumbar spine. No difficulty with gait.  Neurological: Alert and oriented. Cranial nerves II-XII grossly intact. Coordination normal.  Psychiatric: Mood and affect normal. Behavior is normal. Judgment and thought content normal.    BMET    Component Value Date/Time   NA 141 03/07/2015 1124   K 3.9 03/07/2015 1124   CL 104 03/07/2015 1124   CO2 30 03/07/2015 1124   GLUCOSE 101* 03/07/2015 1124   BUN 9 03/07/2015 1124   CREATININE 0.62 03/07/2015 1124   CALCIUM 9.4 03/07/2015 1124   GFRNONAA >60 07/18/2010 0500   GFRAA  07/18/2010 0500    >60        The eGFR has been calculated using the MDRD equation. This calculation has not been validated in all clinical situations. eGFR's persistently <60 mL/min signify possible Chronic Kidney Disease.    Lipid Panel     Component Value Date/Time   CHOL  07/18/2010 0500    163 (NOTE) ATP III Classification:      < 200        mg/dL        Desirable     200 - 239     mg/dL        Borderline High     >= 240        mg/dL        High    TRIG 83 07/18/2010 0500   HDL 19* 07/18/2010 0500   CHOLHDL 8.6 07/18/2010 0500   VLDL 17 07/18/2010 0500   LDLCALC * 07/18/2010 0500    127 (NOTE)  Total Cholesterol/HDL Ratio:CHD Risk                        Coronary Heart Disease Risk Table                                       Men       Women         1/2 Average Risk  3.4        3.3             Average Risk              5.0         4.4          2X Average Risk              9.6        7.1          3X Average Risk             23.4       11.0 Use the calculated Patient Ratio above and the CHD Risk table  to determine the patient's CHD Risk. ATP III Classification (LDL):      < 100         mg/dL         Optimal     100 - 129     mg/dL         Near or Above Optimal     130 - 159     mg/dL         Borderline High     160 - 189     mg/dL         High      > 190        mg/dL         Very High     CBC    Component Value Date/Time   WBC 6.5 07/18/2010 0500   RBC 3.89 07/18/2010 0500   HGB 9.8* 07/18/2010 0500   HCT 31.8* 07/18/2010 0500   PLT 254 07/18/2010 0500   MCV 81.7 07/18/2010 0500   MCH 25.2* 07/18/2010 0500   MCHC 30.8 07/18/2010 0500   RDW 17.5* 07/18/2010 0500   LYMPHSABS 1.3 07/17/2010 1050   MONOABS 0.4 07/17/2010 1050   EOSABS 0.1 07/17/2010 1050   BASOSABS 0.0 07/17/2010 1050    Hgb A1C Lab Results  Component Value Date   HGBA1C * 07/18/2010    6.4 (NOTE)                                                                       According to the ADA Clinical Practice Recommendations for 2011, when HbA1c is used as a screening test:   >=6.5%   Diagnostic of Diabetes Mellitus           (if abnormal result  is confirmed)  5.7-6.4%   Increased risk of developing Diabetes Mellitus  References:Diagnosis and Classification of Diabetes Mellitus,Diabetes IRCV,8938,10(FBPZW 1):S62-S69 and Standards of Medical Care in         Diabetes - 2011,Diabetes Care,2011,34  (Suppl 1):S11-S61.         Assessment & Plan:   Preventative Health Maintenance:  She decline flu shot today Tdap today Pap smear UTD She is establishing with a new gyn in the next few months, she wants to see if they do mammography at the office, if  not, she will call back for me to order. She declines screening colonoscopy, IFOB ordered today Encouraged her to consume a balanced diet and start an exercise regimen Will check CBC, CMET, Lipid, A1C, Hep  C and HIV today  RTC in 6 months to follow up chronic conditions.  HPI:  Pt presents to the clinic today for 1 month follow up of HTN. Her BP at her last visit was 144/96. She was started on HCTZ 25 mg daily. She reports she has been taking 1/2 tablet daily, because a whole tab caused her to have a headache. She denies chest pain, chest tightness or shortness of breath. ECG from 2012 reviewed.  Review of Systems:   Past Medical History  Diagnosis Date  . Hypertension   . Anxiety   . Ear infection     recurrent  . Allergy     Current Outpatient Prescriptions  Medication Sig Dispense Refill  . cetirizine (ZYRTEC) 10 MG tablet Take 10 mg by mouth daily.    . clonazePAM (KLONOPIN) 0.5 MG tablet Take 0.5 mg by mouth as needed.     . hydrochlorothiazide (HYDRODIURIL) 25 MG tablet Take 1 tablet (25 mg total) by mouth daily. 30 tablet 1  . levocetirizine (XYZAL) 5 MG tablet Take 5 mg by mouth every evening.     . triamcinolone (NASACORT ALLERGY 24HR) 55 MCG/ACT AERO nasal inhaler Place 2 sprays into the nose daily.    . Vitamin D, Ergocalciferol, (DRISDOL) 50000 UNITS CAPS capsule Take 50,000 Units by mouth every 7 (seven) days.   2   No current facility-administered medications for this visit.    No Known Allergies  Family History  Problem Relation Age of Onset  . Hypertension Mother     alive  . Arthritis Mother   . Lung cancer Father     deceased  . Heart disease Father   . Other      1 brother and 1 sister both alive an healthy  . Hypertension Maternal Grandmother     Social History   Social History  . Marital Status: Married    Spouse Name: N/A  . Number of Children: 3  . Years of Education: N/A   Occupational History  . customer service [place]    Social History Main Topics  . Smoking status: Former Smoker    Types: Cigarettes    Quit date: 04/20/1985  . Smokeless tobacco: Not on file     Comment: remote tobacco use   . Alcohol Use: No  . Drug Use: No  . Sexual Activity: No   Other Topics Concern  . Not on file   Social History Narrative     Constitutional: Denies fever, malaise, fatigue, headache or abrupt weight changes.  HEENT: Denies eye pain, eye redness, ear pain, ringing in the ears, wax buildup, runny nose, nasal congestion, bloody nose, or sore throat. Respiratory: Denies difficulty breathing, shortness of breath, cough or sputum production.   Cardiovascular: Denies chest pain, chest tightness, palpitations or swelling in the hands or feet.  Neurological: Pt reports left leg numbness. Denies dizziness, difficulty with memory, difficulty with speech or problems with balance and coordination.    No other specific complaints in a complete review of systems (except as listed in HPI above).  Objective:  BP 142/84 mmHg  Pulse 71  Temp(Src) 97.8 F (36.6 C) (Oral)  Ht _0  (1.702 m)  Wt 227 lb (102.967 kg)  BMI 35.55 kg/m2  SpO2 98%  LMP 02/27/2015  General: Appears her stated age, obese in NAD. HEENT: Head: normal shape and size; Eyes: sclera white, no icterus, conjunctiva pink, PERRLA and EOMs intact;  Cardiovascular: Normal rate and rhythm. S1,S2 noted.  No murmur, rubs or gallops noted. No JVD or BLE edema. No carotid bruits noted. Pulmonary/Chest: Normal effort and positive vesicular breath sounds. No respiratory distress. No wheezes, rales or ronchi noted.  ANeurological: Alert and oriented.   Assessment and Plan:

## 2015-04-16 NOTE — Addendum Note (Signed)
Addended by: Roena MaladyEVONTENNO, Kaushik Maul Y on: 04/16/2015 02:20 PM   Modules accepted: Orders

## 2015-04-16 NOTE — Assessment & Plan Note (Signed)
BP improved but not at goal Change HCTZ to Lisinopril HCT 10-12.5, RX sent to pharmacy [facility] today

## 2015-04-16 NOTE — Progress Notes (Signed)
Pre visit review using our clinic review tool, if applicable. No additional management support is needed unless otherwise documented below in the visit note. 

## 2015-04-16 NOTE — Patient Instructions (Signed)
Health Maintenance, Female Adopting a healthy lifestyle and getting preventive care can go a long way to promote health and wellness. Talk with your health care provider about what schedule of regular examinations is right for you. This is a good chance for you to check in with your provider about disease prevention and staying healthy. In between checkups, there are plenty of things you can do on your own. Experts have done a lot of research about which lifestyle changes and preventive measures are most likely to keep you healthy. Ask your health care provider for more information. WEIGHT AND DIET  Eat a healthy diet  Be sure to include plenty of vegetables, fruits, low-fat dairy products, and lean protein.  Do not eat a lot of foods high in solid fats, added sugars, or salt.  Get regular exercise. This is one of the most important things you can do for your health.  Most adults should exercise for at least 150 minutes each week. The exercise should increase your heart rate and make you sweat (moderate-intensity exercise).  Most adults should also do strengthening exercises at least twice a week. This is in addition to the moderate-intensity exercise.  Maintain a healthy weight  Body mass index (BMI) is a measurement that can be used to identify possible weight problems. It estimates body fat based on height and weight. Your health care provider can help determine your BMI and help you achieve or maintain a healthy weight.  For females 20 years of age and older:   A BMI below 18.5 is considered underweight.  A BMI of 18.5 to 24.9 is normal.  A BMI of 25 to 29.9 is considered overweight.  A BMI of 30 and above is considered obese.  Watch levels of cholesterol and blood lipids  You should start having your blood tested for lipids and cholesterol at 52 years of age, then have this test every 5 years.  You may need to have your cholesterol levels checked more often if:  Your lipid  or cholesterol levels are high.  You are older than 52 years of age.  You are at high risk for heart disease.  CANCER SCREENING   Lung Cancer  Lung cancer screening is recommended for adults 55-80 years old who are at high risk for lung cancer because of a history of smoking.  A yearly low-dose CT scan of the lungs is recommended for people who:  Currently smoke.  Have quit within the past 15 years.  Have at least a 30-pack-year history of smoking. A pack year is smoking an average of one pack of cigarettes a day for 1 year.  Yearly screening should continue until it has been 15 years since you quit.  Yearly screening should stop if you develop a health problem that would prevent you from having lung cancer treatment.  Breast Cancer  Practice breast self-awareness. This means understanding how your breasts normally appear and feel.  It also means doing regular breast self-exams. Let your health care provider know about any changes, no matter how small.  If you are in your 20s or 30s, you should have a clinical breast exam (CBE) by a health care provider every 1-3 years as part of a regular health exam.  If you are 40 or older, have a CBE every year. Also consider having a breast X-ray (mammogram) every year.  If you have a family history of breast cancer, talk to your health care provider about genetic screening.  If you   are at high risk for breast cancer, talk to your health care provider about having an MRI and a mammogram every year.  Breast cancer gene (BRCA) assessment is recommended for women who have family members with BRCA-related cancers. BRCA-related cancers include:  Breast.  Ovarian.  Tubal.  Peritoneal cancers.  Results of the assessment will determine the need for genetic counseling and BRCA1 and BRCA2 testing. Cervical Cancer Your health care provider may recommend that you be screened regularly for cancer of the pelvic organs (ovaries, uterus, and  vagina). This screening involves a pelvic examination, including checking for microscopic changes to the surface of your cervix (Pap test). You may be encouraged to have this screening done every 3 years, beginning at age 21.  For women ages 30-65, health care providers may recommend pelvic exams and Pap testing every 3 years, or they may recommend the Pap and pelvic exam, combined with testing for human papilloma virus (HPV), every 5 years. Some types of HPV increase your risk of cervical cancer. Testing for HPV may also be done on women of any age with unclear Pap test results.  Other health care providers may not recommend any screening for nonpregnant women who are considered low risk for pelvic cancer and who do not have symptoms. Ask your health care provider if a screening pelvic exam is right for you.  If you have had past treatment for cervical cancer or a condition that could lead to cancer, you need Pap tests and screening for cancer for at least 20 years after your treatment. If Pap tests have been discontinued, your risk factors (such as having a new sexual partner) need to be reassessed to determine if screening should resume. Some women have medical problems that increase the chance of getting cervical cancer. In these cases, your health care provider may recommend more frequent screening and Pap tests. Colorectal Cancer  This type of cancer can be detected and often prevented.  Routine colorectal cancer screening usually begins at 52 years of age and continues through 52 years of age.  Your health care provider may recommend screening at an earlier age if you have risk factors for colon cancer.  Your health care provider may also recommend using home test kits to check for hidden blood in the stool.  A small camera at the end of a tube can be used to examine your colon directly (sigmoidoscopy or colonoscopy). This is done to check for the earliest forms of colorectal  cancer.  Routine screening usually begins at age 50.  Direct examination of the colon should be repeated every 5-10 years through 52 years of age. However, you may need to be screened more often if early forms of precancerous polyps or small growths are found. Skin Cancer  Check your skin from head to toe regularly.  Tell your health care provider about any new moles or changes in moles, especially if there is a change in a mole's shape or color.  Also tell your health care provider if you have a mole that is larger than the size of a pencil eraser.  Always use sunscreen. Apply sunscreen liberally and repeatedly throughout the day.  Protect yourself by wearing long sleeves, pants, a wide-brimmed hat, and sunglasses whenever you are outside. HEART DISEASE, DIABETES, AND HIGH BLOOD PRESSURE   High blood pressure causes heart disease and increases the risk of stroke. High blood pressure is more likely to develop in:  People who have blood pressure in the high end   of the normal range (130-139/85-89 mm Hg).  People who are overweight or obese.  People who are African American.  If you are 38-23 years of age, have your blood pressure checked every 3-5 years. If you are 61 years of age or older, have your blood pressure checked every year. You should have your blood pressure measured twice--once when you are at a hospital or clinic, and once when you are not at a hospital or clinic. Record the average of the two measurements. To check your blood pressure when you are not at a hospital or clinic, you can use:  An automated blood pressure machine at a pharmacy.  A home blood pressure monitor.  If you are between 45 years and 39 years old, ask your health care provider if you should take aspirin to prevent strokes.  Have regular diabetes screenings. This involves taking a blood sample to check your fasting blood sugar level.  If you are at a normal weight and have a low risk for diabetes,  have this test once every three years after 52 years of age.  If you are overweight and have a high risk for diabetes, consider being tested at a younger age or more often. PREVENTING INFECTION  Hepatitis B  If you have a higher risk for hepatitis B, you should be screened for this virus. You are considered at high risk for hepatitis B if:  You were born in a country where hepatitis B is common. Ask your health care provider which countries are considered high risk.  Your parents were born in a high-risk country, and you have not been immunized against hepatitis B (hepatitis B vaccine).  You have HIV or AIDS.  You use needles to inject street drugs.  You live with someone who has hepatitis B.  You have had sex with someone who has hepatitis B.  You get hemodialysis treatment.  You take certain medicines for conditions, including cancer, organ transplantation, and autoimmune conditions. Hepatitis C  Blood testing is recommended for:  Everyone born from [birth year] through 1963.  Anyone with known risk factors for hepatitis C. Sexually transmitted infections (STIs)  You should be screened for sexually transmitted infections (STIs) including gonorrhea and chlamydia if:  You are sexually active and are younger than 52 years of age.  You are older than 53 years of age and your health care provider tells you that you are at risk for this type of infection.  Your sexual activity has changed since you were last screened and you are at an increased risk for chlamydia or gonorrhea. Ask your health care provider if you are at risk.  If you do not have HIV, but are at risk, it may be recommended that you take a prescription medicine daily to prevent HIV infection. This is called pre-exposure prophylaxis (PrEP). You are considered at risk if:  You are sexually active and do not regularly use condoms or know the HIV status of your partner(s).  You take drugs by injection.  You are sexually  active with a partner who has HIV. Talk with your health care provider about whether you are at high risk of being infected with HIV. If you choose to begin PrEP, you should first be tested for HIV. You should then be tested every 3 months for as long as you are taking PrEP.  PREGNANCY   If you are premenopausal and you may become pregnant, ask your health care provider about preconception counseling.  If you may  become pregnant, take 400 to 800 micrograms (mcg) of folic acid every day.  If you want to prevent pregnancy, talk to your health care provider about birth control (contraception). OSTEOPOROSIS AND MENOPAUSE   Osteoporosis is a disease in which the bones lose minerals and strength with aging. This can result in serious bone fractures. Your risk for osteoporosis can be identified using a bone density scan.  If you are 61 years of age or older, or if you are at risk for osteoporosis and fractures, ask your health care provider if you should be screened.  Ask your health care provider whether you should take a calcium or vitamin D supplement to lower your risk for osteoporosis.  Menopause may have certain physical symptoms and risks.  Hormone replacement therapy may reduce some of these symptoms and risks. Talk to your health care provider about whether hormone replacement therapy is right for you.  HOME CARE INSTRUCTIONS   Schedule regular health, dental, and eye exams.  Stay current with your immunizations.   Do not use any tobacco products including cigarettes, chewing tobacco, or electronic cigarettes.  If you are pregnant, do not drink alcohol.  If you are breastfeeding, limit how much and how often you drink alcohol.  Limit alcohol intake to no more than 1 drink per day for nonpregnant women. One drink equals 12 ounces of beer, 5 ounces of wine, or 1 ounces of hard liquor.  Do not use street drugs.  Do not share needles.  Ask your health care provider for help if  you need support or information about quitting drugs.  Tell your health care provider if you often feel depressed.  Tell your health care provider if you have ever been abused or do not feel safe at home.   This information is not intended to replace advice given to you by your health care provider. Make sure you discuss any questions you have with your health care provider.   Document Released: 10/20/2010 Document Revised: 04/27/2014 Document Reviewed: 03/08/2013 Elsevier Interactive Patient Education [employer].

## 2015-04-16 NOTE — Addendum Note (Signed)
Addended by: Lorre MunroeBAITY, Nikayla Madaris W on: 04/16/2015 09:53 AM   Modules accepted: Kipp BroodSmartSet

## 2015-04-17 LAB — HIV ANTIBODY (ROUTINE TESTING W REFLEX): HIV: NONREACTIVE

## 2015-04-17 LAB — HEPATITIS C ANTIBODY: HCV Ab: NEGATIVE

## 2015-04-19 ENCOUNTER — Ambulatory Visit: Payer: 59 | Admitting: Internal Medicine

## 2015-04-19 ENCOUNTER — Encounter: Payer: Self-pay | Admitting: Family Medicine

## 2015-04-19 ENCOUNTER — Telehealth: Payer: Self-pay

## 2015-04-19 ENCOUNTER — Ambulatory Visit (INDEPENDENT_AMBULATORY_CARE_PROVIDER_SITE_OTHER)
Admission: RE | Admit: 2015-04-19 | Discharge: 2015-04-19 | Disposition: A | Discharge status: Home / Self Care | Payer: 59 | Source: Ambulatory Visit | Attending: Family Medicine | Admitting: Family Medicine

## 2015-04-19 ENCOUNTER — Ambulatory Visit (INDEPENDENT_AMBULATORY_CARE_PROVIDER_SITE_OTHER): Payer: 59 | Admitting: Family Medicine

## 2015-04-19 VITALS — BP 138/90 | HR 74 | Temp 97.6°F | Ht 67.0 in | Wt 232.8 lb

## 2015-04-19 DIAGNOSIS — M25561 Pain in right knee: Secondary | ICD-10-CM

## 2015-04-19 MED ORDER — DICLOFENAC SODIUM 75 MG PO TBEC: 75.0000 mg | DELAYED_RELEASE_TABLET | Freq: Two times a day (BID) | ORAL | Status: DC

## 2015-04-19 NOTE — Patient Instructions (Signed)
Continue compression and elevation.  Use the diclofenac over the next few days to 1-2 weeks.  Follow up if you fail to improve as you may need imaging/US.  Take care  Dr. Adriana Simasook

## 2015-04-19 NOTE — Telephone Encounter (Signed)
She can take Ibuprofen and put ice on it for swelling and discomfort.

## 2015-04-19 NOTE — Telephone Encounter (Signed)
Unable to reach pt at any contact # and pt is not at work at this time.

## 2015-04-19 NOTE — Progress Notes (Signed)
Pre visit review using our clinic review tool, if applicable. No additional management support is needed unless otherwise documented below in the visit note. 

## 2015-04-19 NOTE — Progress Notes (Signed)
Subjective:  Patient ID: Diana [name], female    DOB: 19-Mar-1961  Age: 54 y.o. MRN: [id number]  CC: Right knee pain after recent fall  HPI:  54 year old obese female presents with complaints of R knee pain after suffering a fall yesterday.  Right knee pain  Patient had a misstep yesterday and tripped. She fell forward directly on her right knee.   Patient states that since her fall she's had significant knee pain. She has also had swelling.   She states that she has both anterior and posterior knee pain.  She also has lower leg pain (anterior) as well.  No reports of locking or instability.  She's been using compression sleeve with improvement in her pain.  She is able to ambulate but has pain.  No exacerbating factors.  Social Hx   Social History   Social History  . Marital Status: Married    Spouse Name: N/A  . Number of Children: 3  . Years of Education: N/A   Occupational History  . customer service [employer]   Social History Main Topics  . Smoking status: Former Smoker    Types: Cigarettes    Quit date: 04/20/1985  . Smokeless tobacco: None     Comment: remote tobacco use   . Alcohol Use: No  . Drug Use: No  . Sexual Activity: No   Other Topics Concern  . None   Social History Narrative   Review of Systems  Constitutional: Negative.   Musculoskeletal:       Right knee pain.   Objective:  BP 138/90 mmHg  Pulse 74  Temp(Src) 97.6 F (36.4 C) (Oral)  Ht  (1.702 m)  Wt 232 lb 12 oz (105.575 kg)  BMI 36.45 kg/m2  SpO2 96%  LMP 02/27/2015  BP/Weight 04/19/2015 04/16/2015 03/07/2015  Systolic BP 138 142 144  Diastolic BP 90 84 96  Wt. (Lbs) 232.75 227 228  BMI 36.45 35.55 35.7    Physical Exam  Constitutional: She appears well-developed. No distress.  Musculoskeletal:  Knee: Right  Inspection - knee abrasion noted.  Palpation - medial joint line tenderness. Small effusion and mild warmth.  ROM - lacking full extension.    Negative Mcmurray's.    Neurological: She is alert.  Skin:  Abrasion noted - Right knee.  Psychiatric: She has a normal mood and affect.  Vitals reviewed.   Lab Results  Component Value Date   WBC 5.2 04/16/2015   HGB 15.0 04/16/2015   HCT 44.2 04/16/2015   PLT 214.0 04/16/2015   GLUCOSE 120* 04/16/2015   CHOL 174 04/16/2015   TRIG 76.0 04/16/2015   HDL 18.20* 04/16/2015   LDLCALC 141* 04/16/2015   ALT 9 04/16/2015   AST 13 04/16/2015   NA 138 04/16/2015   K 3.9 04/16/2015   CL 101 04/16/2015   CREATININE 0.66 04/16/2015   BUN 11 04/16/2015   CO2 30 04/16/2015   INR 1.06 07/17/2010   HGBA1C 5.6 04/16/2015    Assessment & Plan:   Problem List Items Addressed This Visit    Right knee pain - Primary    New problem. Xray obtained today and I personally reviewed it. Interpretation - No evidence of acute fracture.  Exam with joint line tenderness suggestive of meniscal injury. Treating with Diclofenac and continued compression. If fails to improve with the above, will need to follow up for further eval ([place] vs MRI and potential ortho referral).  Relevant Orders   DG Knee Complete 4 Views Right (Completed)      Meds ordered this encounter  Medications  . diclofenac (VOLTAREN) 75 MG EC tablet    Sig: Take 1 tablet (75 mg total) by mouth 2 (two) times daily.    Dispense:  30 tablet    Refill:  0    Follow-up: PRN  Everlene OtherJayce Congetta Odriscoll DO [facility] [place]

## 2015-04-19 NOTE — Telephone Encounter (Signed)
PLEASE NOTE: All timestamps contained within this report are represented as [place] Standard Time. CONFIDENTIALTY NOTICE: This fax transmission is intended only for the addressee. It contains information that is legally privileged, confidential or otherwise protected from use or disclosure. If you are not the intended recipient, you are strictly prohibited from reviewing, disclosing, copying using or disseminating any of this information or taking any action in reliance on or regarding this information. If you have received this fax in error, please notify us immediately by telephone so that we can arrange for its return to [place]. Phone: [phone], Toll-Free: [phone], Fax: [phone] Page: 1 of 2 Call Id: [id number] [facility] Night - Client TELEPHONE ADVICE RECORD [facility] Patient Name: Diana [name] Gender: Female DOB: Dec 15, 1960 Age: 54 Y 4 M 22 D Return Phone Number: [phone] (Primary) Address: City/State/Zip: [place] Client [facility] Night - Client Client Site [facility] - Night Physician [name] Contact Type Call Call Type Triage / Clinical Relationship To Patient Self Return Phone Number [phone] (Primary) Chief Complaint Leg Injury Initial Comment Fell on knee and leg is a little swollen PreDisposition Call Doctor Nurse Assessment Nurse: Charm Barges, RN, Charles Date/Time (Eastern Time): 04/18/2015 4:52:51 PM Confirm and document reason for call. If symptomatic, describe symptoms. ---caller states fell on knee and now leg is swollen. mainly below the knee toward lateral. minimal scrape on right leg and knee. throbbing like a toothache Has the patient traveled out of the country within the last 30 days? ---No Does the patient have any new or worsening symptoms? ---Yes Will a triage be completed? ---Yes Related visit to physician within the last 2 weeks? ---No Does the PT  have any chronic conditions? (i.e. diabetes, asthma, etc.) ---No Did the patient indicate they were pregnant? ---No Is this a behavioral health or substance abuse call? ---No Guidelines Guideline Title Affirmed Question Affirmed Notes Nurse Date/Time Lamount Cohen Time) Leg Injury [1] Large swelling or bruise AND [2] size > palm of person's hand Arnoldo Morale 04/18/2015 4:54:49 PM Disp. Time Lamount Cohen Time) Disposition Final User 04/18/2015 4:31:28 PM Attempt made - message left Arnoldo Morale 04/18/2015 4:40:45 PM Attempt made - no message left Arnoldo Morale 04/18/2015 4:57:23 PM See Physician within 24 Hours Yes Charm Barges, RN, Arnette Felts Understands: Yes Disagree/Comply: Comply PLEASE NOTE: All timestamps contained within this report are represented as [place] Standard Time. CONFIDENTIALTY NOTICE: This fax transmission is intended only for the addressee. It contains information that is legally privileged, confidential or otherwise protected from use or disclosure. If you are not the intended recipient, you are strictly prohibited from reviewing, disclosing, copying using or disseminating any of this information or taking any action in reliance on or regarding this information. If you have received this fax in error, please notify us immediately by telephone so that we can arrange for its return to [place]. Phone: [phone], Toll-Free: [phone], Fax: [phone] Page: 2 of 2 Call Id: [id number] Care Advice Given Per Guideline SEE PHYSICIAN WITHIN 24 HOURS: * IF OFFICE WILL BE OPEN: You need to be seen within the next 24 hours. Call your doctor when the office opens, and make an appointment. LOCAL COLD: For bruises or swelling, apply a cold pack or an ice bag (wrapped in a moist towel) to the area for 20 minutes per hour. Repeat for 4 consecutive hours. (Reason: reduce the bleeding and pain) PAIN MEDICINES: * For pain relief, take acetaminophen, ibuprofen, or naproxen.  ACETAMINOPHEN (E.G., TYLENOL): * Take 650 mg (two 325 mg pills) by mouth every 4-6 hours as needed. Each Regular Strength Tylenol pill has 325 mg of acetaminophen. The most you should take each day is 3,250 mg (10 Regular Strength pills a day). CALL BACK IF: * Pain becomes severe * You become worse. CARE ADVICE given per Leg Injury (Adult) guideline. IBUPROFEN (E.G., MOTRIN, ADVIL): * Take 400 mg (two 200 mg pills) by mouth every 6 hours as needed. After Care Instructions Given Call Event Type User Date / Time Description Referrals REFERRED TO PCP OFFICE

## 2015-04-19 NOTE — Assessment & Plan Note (Signed)
New problem. Xray obtained today and I personally reviewed it. Interpretation - No evidence of acute fracture.  Exam with joint line tenderness suggestive of meniscal injury. Treating with Diclofenac and continued compression. If fails to improve with the above, will need to follow up for further eval ([place] vs MRI and potential ortho referral).

## 2015-04-25 ENCOUNTER — Ambulatory Visit (INDEPENDENT_AMBULATORY_CARE_PROVIDER_SITE_OTHER): Payer: BLUE CROSS/BLUE SHIELD | Admitting: Internal Medicine

## 2015-04-25 ENCOUNTER — Encounter: Payer: Self-pay | Admitting: *Deleted

## 2015-04-25 ENCOUNTER — Encounter: Payer: Self-pay | Admitting: Internal Medicine

## 2015-04-25 VITALS — BP 130/80 | HR 75 | Temp 98.3°F | Wt 231.0 lb

## 2015-04-25 DIAGNOSIS — M25561 Pain in right knee: Secondary | ICD-10-CM | POA: Diagnosis not present

## 2015-04-25 DIAGNOSIS — W1849XD Other slipping, tripping and stumbling without falling, subsequent encounter: Secondary | ICD-10-CM | POA: Diagnosis not present

## 2015-04-25 DIAGNOSIS — W010XXD Fall on same level from slipping, tripping and stumbling without subsequent striking against object, subsequent encounter: Secondary | ICD-10-CM

## 2015-04-25 NOTE — Progress Notes (Signed)
Subjective:    Patient ID: Diana [name], female    DOB: Dec 31, 1960, 55 y.o.   MRN: [id number]  HPI  Pt presents to the clinic today with c/o right knee pain. This started 12/29 after a fall, in which she tripped and landed on her right knee. She was evaluated 12/30 by Dr. Lacinda Axon. Xray did not show any acute fracture. Dr. Lacinda Axon thought she may have a meniscal injury. He gave her Diclofenac and advised her to wrap her knee to help with swelling. She reports she has ongoing pain and swelling in her right knee. She did not take the Diclofenac but has been taking Advil instead. She is now having left him pain because she feels like she has been shifting her weight to her left side to take the pressure off her right knee.  Review of Systems      Past Medical History  Diagnosis Date  . Hypertension   . Anxiety   . Ear infection     recurrent  . Allergy     Current Outpatient Prescriptions  Medication Sig Dispense Refill  . cetirizine (ZYRTEC) 10 MG tablet Take 10 mg by mouth daily.    . clonazePAM (KLONOPIN) 0.5 MG tablet Take 0.5 mg by mouth as needed.     . diclofenac (VOLTAREN) 75 MG EC tablet Take 1 tablet (75 mg total) by mouth 2 (two) times daily. 30 tablet 0  . lisinopril-hydrochlorothiazide (PRINZIDE,ZESTORETIC) 10-12.5 MG tablet Take 1 tablet by mouth daily. 30 tablet 5  . Vitamin D, Ergocalciferol, (DRISDOL) 50000 UNITS CAPS capsule Take 50,000 Units by mouth every 7 (seven) days.   2   No current facility-administered medications for this visit.    No Known Allergies  Family History  Problem Relation Age of Onset  . Hypertension Mother     alive  . Arthritis Mother   . Lung cancer Father     deceased  . Heart disease Father   . Other      1 brother and 1 sister both alive an healthy  . Hypertension Maternal Grandmother     Social History   Social History  . Marital Status: Married    Spouse Name: N/A  . Number of Children: 3  . Years of Education: N/A    Occupational History  . customer service [employer]   Social History Main Topics  . Smoking status: Former Smoker    Types: Cigarettes    Quit date: 04/20/1985  . Smokeless tobacco: Not on file     Comment: remote tobacco use   . Alcohol Use: No  . Drug Use: No  . Sexual Activity: No   Other Topics Concern  . Not on file   Social History Narrative     Constitutional: Denies fever, malaise, fatigue, headache or abrupt weight changes.  Musculoskeletal: Pt reports right knee pain and swelling. Denies difficulty with gait, muscle pain  Skin: Pt reports bruising of right knee. Denies redness, rashes, lesions or ulcercations.   No other specific complaints in a complete review of systems (except as listed in HPI above).  Objective:   Physical Exam   BP 130/80 mmHg  Pulse 75  Temp(Src) 98.3 F (36.8 C) (Tympanic)  Wt 231 lb (104.781 kg)  SpO2 99%  LMP 02/27/2015 Wt Readings from Last 3 Encounters:  04/25/15 231 lb (104.781 kg)  04/19/15 232 lb 12 oz (105.575 kg)  04/16/15 227 lb (102.967 kg)    General: Appears her stated  age, obese in NAD. Skin: Bruising noted from right knee down to mid shin. Musculoskeletal: Normal flexion and extension of the right knee. Medial swelling noted. Pain with palpation of the right medial knee. Negative McMurray. Negative Lachman's Neurological: Alert and oriented. Sensation intact to BLE.  BMET    Component Value Date/Time   NA 138 04/16/2015 0923   K 3.9 04/16/2015 0923   CL 101 04/16/2015 0923   CO2 30 04/16/2015 0923   GLUCOSE 120* 04/16/2015 0923   BUN 11 04/16/2015 0923   CREATININE 0.66 04/16/2015 0923   CALCIUM 9.3 04/16/2015 0923   GFRNONAA >60 07/18/2010 0500   GFRAA  07/18/2010 0500    >60        The eGFR has been calculated using the MDRD equation. This calculation has not been validated in all clinical situations. eGFR's persistently <60 mL/min signify possible Chronic Kidney Disease.    Lipid Panel      Component Value Date/Time   CHOL 174 04/16/2015 0923   TRIG 76.0 04/16/2015 0923   HDL 18.20* 04/16/2015 0923   CHOLHDL 10 04/16/2015 0923   VLDL 15.2 04/16/2015 0923   LDLCALC 141* 04/16/2015 0923    CBC    Component Value Date/Time   WBC 5.2 04/16/2015 0923   RBC 4.87 04/16/2015 0923   HGB 15.0 04/16/2015 0923   HCT 44.2 04/16/2015 0923   PLT 214.0 04/16/2015 0923   MCV 90.6 04/16/2015 0923   MCH 25.2* 07/18/2010 0500   MCHC 33.9 04/16/2015 0923   RDW 14.1 04/16/2015 0923   LYMPHSABS 1.3 07/17/2010 1050   MONOABS 0.4 07/17/2010 1050   EOSABS 0.1 07/17/2010 1050   BASOSABS 0.0 07/17/2010 1050    Hgb A1C Lab Results  Component Value Date   HGBA1C 5.6 04/16/2015        Assessment & Plan:   Right knee pain s/p fall:  Reviewed Dr. Jonathon Jordan note and xray of right knee Will obtain MRI at this time- still concern for meniscal injury Advised her to continue Advil as needed Continue ice and compression May still need referral to ortho- will await MRI results  Will follow up after MRI

## 2015-04-25 NOTE — Patient Instructions (Signed)

## 2015-04-25 NOTE — Progress Notes (Signed)
Pre visit review using our clinic review tool, if applicable. No additional management support is needed unless otherwise documented below in the visit note. 

## 2015-05-01 ENCOUNTER — Ambulatory Visit
Admission: RE | Admit: 2015-05-01 | Discharge: 2015-05-01 | Disposition: A | Discharge status: Home / Self Care | Payer: BLUE CROSS/BLUE SHIELD | Source: Ambulatory Visit | Attending: Internal Medicine | Admitting: Internal Medicine

## 2015-05-01 DIAGNOSIS — M25561 Pain in right knee: Secondary | ICD-10-CM

## 2015-05-03 ENCOUNTER — Telehealth: Payer: Self-pay | Admitting: Internal Medicine

## 2015-05-03 NOTE — Telephone Encounter (Signed)
PT CALLED HAS SEVERAL QUESTIONS FOR Shawna OrleansMelanie,  Please call today at [phone]

## 2015-05-03 NOTE — Telephone Encounter (Signed)
Patient returned Melanie's call from yesterday.

## 2015-05-04 ENCOUNTER — Other Ambulatory Visit: Payer: Self-pay | Admitting: Internal Medicine

## 2015-05-04 DIAGNOSIS — M25561 Pain in right knee: Secondary | ICD-10-CM

## 2015-05-06 NOTE — Telephone Encounter (Signed)
Spoke to pt and she stated she did not have additional questions

## 2015-05-08 ENCOUNTER — Telehealth: Payer: Self-pay | Admitting: Internal Medicine

## 2015-05-08 NOTE — Telephone Encounter (Signed)
Patient Name: MALORY SPURR DOB: 1961-02-11 Initial Comment Caller states she needs an appointment, is very hot, feels faint, and is lightheaded. Nurse Assessment Nurse: Nicanor Bake, RN, Marylene Land Date/Time (Eastern Time): 05/08/2015 5:27:14 PM Confirm and document reason for call. If symptomatic, describe symptoms. You must click the next button to save text entered. ---Caller states an hour ago, she got nauseated and light headed and started sweating and felt faint. She drank a coke but that didnt help. She feels "sick to my stomach". Has the patient traveled out of the country within the last 30 days? ---No Does the patient have any new or worsening symptoms? ---Yes Will a triage be completed? ---Yes Related visit to physician within the last 2 weeks? ---No Does the PT have any chronic conditions? (i.e. diabetes, asthma, etc.) ---Yes List chronic conditions. ---HTN - 1 "something" over 75 at the orthopedic office for a knee follow up after a fall. Did the patient indicate they were pregnant? ---No Is this a behavioral health or substance abuse call? ---No Guidelines Guideline Title Affirmed Question Affirmed Notes Abdominal Pain - Female [1] MILD-MODERATE abdominal pain AND [2] constant and [3] present < 2 hours (all triage questions negative) Final Disposition User [facility], RN, Marylene Land

## 2015-05-09 ENCOUNTER — Encounter: Payer: Self-pay | Admitting: Internal Medicine

## 2015-05-09 ENCOUNTER — Ambulatory Visit (INDEPENDENT_AMBULATORY_CARE_PROVIDER_SITE_OTHER): Payer: BLUE CROSS/BLUE SHIELD | Admitting: Internal Medicine

## 2015-05-09 VITALS — BP 122/82 | HR 91 | Temp 98.1°F | Wt 224.0 lb

## 2015-05-09 DIAGNOSIS — R142 Eructation: Secondary | ICD-10-CM | POA: Diagnosis not present

## 2015-05-09 DIAGNOSIS — N951 Menopausal and female climacteric states: Secondary | ICD-10-CM | POA: Diagnosis not present

## 2015-05-09 DIAGNOSIS — R232 Flushing: Secondary | ICD-10-CM

## 2015-05-09 DIAGNOSIS — R42 Dizziness and giddiness: Secondary | ICD-10-CM

## 2015-05-09 DIAGNOSIS — R110 Nausea: Secondary | ICD-10-CM

## 2015-05-09 NOTE — Telephone Encounter (Signed)
Pt has appt 05/09/15 at 11:15 with Pamala Hurry NP.

## 2015-05-09 NOTE — Progress Notes (Signed)
Pre visit review using our clinic review tool, if applicable. No additional management support is needed unless otherwise documented below in the visit note. 

## 2015-05-09 NOTE — Patient Instructions (Signed)
Food Choices for Gastroesophageal Reflux Disease, Adult When you have gastroesophageal reflux disease (GERD), the foods you eat and your eating habits are very important. Choosing the right foods can help ease the discomfort of GERD. WHAT GENERAL GUIDELINES DO I NEED TO FOLLOW?  Choose fruits, vegetables, whole grains, low-fat dairy products, and low-fat meat, fish, and poultry.  Limit fats such as oils, salad dressings, butter, nuts, and avocado.  Keep a food diary to identify foods that cause symptoms.  Avoid foods that cause reflux. These may be different for different people.  Eat frequent small meals instead of three large meals each day.  Eat your meals slowly, in a relaxed setting.  Limit fried foods.  Cook foods using methods other than frying.  Avoid drinking alcohol.  Avoid drinking large amounts of liquids with your meals.  Avoid bending over or lying down until 2-3 hours after eating. WHAT FOODS ARE NOT RECOMMENDED? The following are some foods and drinks that may worsen your symptoms: Vegetables Tomatoes. Tomato juice. Tomato and spaghetti sauce. Chili peppers. Onion and garlic. Horseradish. Fruits Oranges, grapefruit, and lemon (fruit and juice). Meats High-fat meats, fish, and poultry. This includes hot dogs, ribs, ham, sausage, salami, and bacon. Dairy Whole milk and chocolate milk. Sour cream. Cream. Butter. Ice cream. Cream cheese.  Beverages Coffee and tea, with or without caffeine. Carbonated beverages or energy drinks. Condiments Hot sauce. Barbecue sauce.  Sweets/Desserts Chocolate and cocoa. Donuts. Peppermint and spearmint. Fats and Oils High-fat foods, including French fries and potato chips. Other Vinegar. Strong spices, such as black pepper, white pepper, red pepper, cayenne, curry powder, cloves, ginger, and chili powder. The items listed above may not be a complete list of foods and beverages to avoid. Contact your dietitian for more  information.   This information is not intended to replace advice given to you by your health care provider. Make sure you discuss any questions you have with your health care provider.   Document Released: 04/06/2005 Document Revised: 04/27/2014 Document Reviewed: 02/08/2013 Elsevier Interactive Patient Education 2016 Elsevier Inc.  

## 2015-05-09 NOTE — Progress Notes (Signed)
Subjective:    Patient ID: Diana [name], female    DOB: March 23, 1961, 55 y.o.   MRN: [id number]  HPI  Pt presents to the clinic today with c/o hot flashes and nausea. This occurred yesterday while at work. She felt like she may pass out, but didn't. It was associated with lightheadedness, nausea and burping. She did try to drink some Coke to see if it would help, but it didn't. She went home and tried to lay down. When she woke up, she continued to feel lightheaded, nauseated and has a sore throat. She denies vomiting. She is having normal bowel movements. Her symptoms have resolved at this point but wanted to come in because she was anxious. She has had reflux in the past but reports this feels completely different. She has never had a issue with hypoglycemia. She has never had menopausal hot flashes.  Review of Systems      Past Medical History  Diagnosis Date  . Hypertension   . Anxiety   . Ear infection     recurrent  . Allergy     Current Outpatient Prescriptions  Medication Sig Dispense Refill  . cetirizine (ZYRTEC) 10 MG tablet Take 10 mg by mouth daily.    . clonazePAM (KLONOPIN) 0.5 MG tablet Take 0.5 mg by mouth as needed.     Marland Kitchen lisinopril-hydrochlorothiazide (PRINZIDE,ZESTORETIC) 10-12.5 MG tablet Take 1 tablet by mouth daily. 30 tablet 5  . Vitamin D, Ergocalciferol, (DRISDOL) 50000 UNITS CAPS capsule Take 50,000 Units by mouth every 7 (seven) days.   2  . gabapentin (NEURONTIN) 100 MG capsule Take 1 capsule by mouth 3 (three) times daily. Reported on 05/09/2015  0   No current facility-administered medications for this visit.    No Known Allergies  Family History  Problem Relation Age of Onset  . Hypertension Mother     alive  . Arthritis Mother   . Lung cancer Father     deceased  . Heart disease Father   . Other      1 brother and 1 sister both alive an healthy  . Hypertension Maternal Grandmother     Social History   Social History  . Marital Status:  Married    Spouse Name: N/A  . Number of Children: 3  . Years of Education: N/A   Occupational History  . customer service [employer]   Social History Main Topics  . Smoking status: Former Smoker    Types: Cigarettes    Quit date: 04/20/1985  . Smokeless tobacco: Not on file     Comment: remote tobacco use   . Alcohol Use: No  . Drug Use: No  . Sexual Activity: No   Other Topics Concern  . Not on file   Social History Narrative     Constitutional: Denies fever, malaise, fatigue, headache or abrupt weight changes.  HEENT: Pt reports sore throat. Denies eye pain, eye redness, ear pain, ringing in the ears, wax buildup, runny nose, nasal congestion, bloody nose. Respiratory: Denies difficulty breathing, shortness of breath, cough or sputum production. Cardiovascular: Denies chest pain, chest tightness, palpitations or swelling in the hands or feet.  Gastrointestinal: Pt reports nausea. Denies abdominal pain, bloating, constipation, diarrhea or blood in the stool.  Neurological: Pt reports lightheaded. Denies dizziness, difficulty with memory, difficulty with speech or problems with balance and coordination.  Psych: Denies anxiety, depression, SI/HI.  No other specific complaints in a complete review of systems (except as listed in  HPI above).  Objective:   Physical Exam   BP 122/82 mmHg  Pulse 91  Temp(Src) 98.1 F (36.7 C) (Oral)  Wt 224 lb (101.606 kg)  SpO2 97%  LMP 02/27/2015 Wt Readings from Last 3 Encounters:  05/09/15 224 lb (101.606 kg)  04/25/15 231 lb (104.781 kg)  04/19/15 232 lb 12 oz (105.575 kg)    General: Appears her stated age, obese in NAD. HEENT:  Ears: Tm's gray and intact, normal light reflex; Throat/Mouth: Teeth present, mucosa pink and moist, no exudate, lesions or ulcerations noted.  Neck:  Neck supple, trachea midline. No masses, lumps or thyromegaly present.  Cardiovascular: Normal rate and rhythm. S1,S2 noted.  No murmur, rubs or  gallops noted. Pulmonary/Chest: Normal effort and positive vesicular breath sounds. No respiratory distress. No wheezes, rales or ronchi noted.  Abdomen: Soft with mild epigastric tenderness. Normal bowel sounds. Neurological: Alert and oriented.     BMET    Component Value Date/Time   NA 138 04/16/2015 0923   K 3.9 04/16/2015 0923   CL 101 04/16/2015 0923   CO2 30 04/16/2015 0923   GLUCOSE 120* 04/16/2015 0923   BUN 11 04/16/2015 0923   CREATININE 0.66 04/16/2015 0923   CALCIUM 9.3 04/16/2015 0923   GFRNONAA >60 07/18/2010 0500   GFRAA  07/18/2010 0500    >60        The eGFR has been calculated using the MDRD equation. This calculation has not been validated in all clinical situations. eGFR's persistently <60 mL/min signify possible Chronic Kidney Disease.    Lipid Panel     Component Value Date/Time   CHOL 174 04/16/2015 0923   TRIG 76.0 04/16/2015 0923   HDL 18.20* 04/16/2015 0923   CHOLHDL 10 04/16/2015 0923   VLDL 15.2 04/16/2015 0923   LDLCALC 141* 04/16/2015 0923    CBC    Component Value Date/Time   WBC 5.2 04/16/2015 0923   RBC 4.87 04/16/2015 0923   HGB 15.0 04/16/2015 0923   HCT 44.2 04/16/2015 0923   PLT 214.0 04/16/2015 0923   MCV 90.6 04/16/2015 0923   MCH 25.2* 07/18/2010 0500   MCHC 33.9 04/16/2015 0923   RDW 14.1 04/16/2015 0923   LYMPHSABS 1.3 07/17/2010 1050   MONOABS 0.4 07/17/2010 1050   EOSABS 0.1 07/17/2010 1050   BASOSABS 0.0 07/17/2010 1050    Hgb A1C Lab Results  Component Value Date   HGBA1C 5.6 04/16/2015        Assessment & Plan:   Hot flashes, nausea, lightheadedness, burping:  Seems c/w reflux especially after eating cheeseburger and fries Try Prilosec 20 mg OTC daily x 2 weeks Avoid greasy foods and red meat If persists, consider workup with CMET, Lipase, H Pylori, TSH. FSH, LH  RTC as needed or if symptoms persist or worsen

## 2015-05-15 ENCOUNTER — Ambulatory Visit (INDEPENDENT_AMBULATORY_CARE_PROVIDER_SITE_OTHER): Payer: BLUE CROSS/BLUE SHIELD | Admitting: Primary Care

## 2015-05-15 ENCOUNTER — Encounter: Payer: Self-pay | Admitting: Primary Care

## 2015-05-15 VITALS — BP 124/84 | HR 80 | Temp 97.8°F | Ht 67.0 in | Wt 229.4 lb

## 2015-05-15 DIAGNOSIS — R0981 Nasal congestion: Secondary | ICD-10-CM

## 2015-05-15 MED ORDER — PREDNISONE 20 MG PO TABS: ORAL_TABLET | ORAL | Status: DC

## 2015-05-15 NOTE — Progress Notes (Signed)
Subjective:    Patient ID: Diana [name], female    DOB: 06/02/1960, 55 y.o.   MRN: [id number]  HPI  Diana [name] is a 55 year old female who presents today with a chief complaint of nasal congestion. She also reports sore throat, ear fullness, rhinorrhea, post nasal drip. Denies fevers, chills, headaches, bodyaches, cough. Her symptoms have been present for 3 days. She's taking Zyrtec and Nasacort without improvement recently. Her work place is currently undergoing renovation which includes pulling up flooring, moving shelves, etc. She has noticed increased amount of dust since.    Review of Systems  Constitutional: Negative for fever, chills and fatigue.  HENT: Positive for congestion, postnasal drip, rhinorrhea and sore throat. Negative for ear pain.   Respiratory: Positive for cough. Negative for shortness of breath and wheezing.   Cardiovascular: Negative for chest pain.  Musculoskeletal: Negative for myalgias.       Past Medical History  Diagnosis Date  . Hypertension   . Anxiety   . Ear infection     recurrent  . Allergy     Social History   Social History  . Marital Status: Married    Spouse Name: N/A  . Number of Children: 3  . Years of Education: N/A   Occupational History  . customer service [employer]   Social History Main Topics  . Smoking status: Former Smoker    Types: Cigarettes    Quit date: 04/20/1985  . Smokeless tobacco: Not on file     Comment: remote tobacco use   . Alcohol Use: No  . Drug Use: No  . Sexual Activity: No   Other Topics Concern  . Not on file   Social History Narrative    Past Surgical History  Procedure Laterality Date  . Tubal ligation      bilateral  . Tonsillectomy      Family History  Problem Relation Age of Onset  . Hypertension Mother     alive  . Arthritis Mother   . Lung cancer Father     deceased  . Heart disease Father   . Other      1 brother and 1 sister both alive an healthy  . Hypertension  Maternal Grandmother     No Known Allergies  Current Outpatient Prescriptions on File Prior to Visit  Medication Sig Dispense Refill  . cetirizine (ZYRTEC) 10 MG tablet Take 10 mg by mouth daily.    . clonazePAM (KLONOPIN) 0.5 MG tablet Take 0.5 mg by mouth as needed.     Marland Kitchen lisinopril-hydrochlorothiazide (PRINZIDE,ZESTORETIC) 10-12.5 MG tablet Take 1 tablet by mouth daily. 30 tablet 5  . Vitamin D, Ergocalciferol, (DRISDOL) 50000 UNITS CAPS capsule Take 50,000 Units by mouth every 7 (seven) days.   2  . gabapentin (NEURONTIN) 100 MG capsule Take 1 capsule by mouth 3 (three) times daily. Reported on 05/15/2015  0   No current facility-administered medications on file prior to visit.    BP 124/84 mmHg  Pulse 80  Temp(Src) 97.8 F (36.6 C) (Oral)  Ht  (1.702 m)  Wt 229 lb 6.4 oz (104.055 kg)  BMI 35.92 kg/m2  SpO2 97%  LMP 05/14/2015    Objective:   Physical Exam  Constitutional: She appears well-nourished.  HENT:  Right Ear: Tympanic membrane and ear canal normal.  Left Ear: Tympanic membrane and ear canal normal.  Nose: Mucosal edema present. Right sinus exhibits no maxillary sinus tenderness and no frontal sinus tenderness.  Left sinus exhibits no maxillary sinus tenderness and no frontal sinus tenderness.  Mouth/Throat: Oropharynx is clear and moist.  Cardiovascular: Normal rate and regular rhythm.   Pulmonary/Chest: Effort normal and breath sounds normal. She has no wheezes. She has no rales.          Assessment & Plan:  Allergic Rhinitis:  Nasal congestion, rhinorrhea, post nasal drip x 3 days. Suspect the cause is due to her work place renovation (removal of old flooring, shelves, etc). Exam without evidence of bacterial or viral involvement. Nasal mucosal edema present. No sinus tenderness. Will have her continue current regimen and will add short prednisone taper for congestion. RX sent to pharmacy. Suggested she wear a mask when around renovation  materials. Return precautions provided.

## 2015-05-15 NOTE — Progress Notes (Signed)
Pre visit review using our clinic review tool, if applicable. No additional management support is needed unless otherwise documented below in the visit note. 

## 2015-05-15 NOTE — Patient Instructions (Signed)
Start Prednisone tablets for inflammation and pressure in your nose/sinuses.  Take 2 tablets for 3 days, then 1 tablet for 3 days.  Continue Zyrtec and Nasacort as discussed.  You may also take Robitussin DM if you develop a cough with chest congestion.  It was a pleasure meeting you!

## 2015-05-20 ENCOUNTER — Telehealth: Payer: Self-pay | Admitting: *Deleted

## 2015-05-20 NOTE — Telephone Encounter (Signed)
Patient left a voicemail stating that she was seen last week and given Prednisone. Patient stated that she now has a mucous cough and can not get it to move. Patient wants to know what else she can do to get rid of this? Pharmacy [facility]

## 2015-05-20 NOTE — Telephone Encounter (Signed)
Called and notified patient of Kate's comments. Patient verbalized understanding.  

## 2015-05-20 NOTE — Telephone Encounter (Signed)
Has she tried Mucinex? Does she feel sick or worse than last week? Any fevers?

## 2015-05-20 NOTE — Telephone Encounter (Signed)
Called patient and she stated that she have not tried mucinex. She did feel worse yesterday. She did noticed thick yellow mucous but no fever. She does feel a little bit better today.

## 2015-05-20 NOTE — Telephone Encounter (Signed)
Have her try Mucinex with a full glass of water. Glad to hear she's slightly improved, but have her notify me Wednesday this week if no improvement with a few days of mucinex.

## 2015-05-21 ENCOUNTER — Encounter: Payer: Self-pay | Admitting: Internal Medicine

## 2015-05-21 ENCOUNTER — Ambulatory Visit (INDEPENDENT_AMBULATORY_CARE_PROVIDER_SITE_OTHER): Payer: BLUE CROSS/BLUE SHIELD | Admitting: Internal Medicine

## 2015-05-21 VITALS — BP 122/72 | HR 107 | Temp 97.7°F | Wt 224.0 lb

## 2015-05-21 DIAGNOSIS — B349 Viral infection, unspecified: Secondary | ICD-10-CM | POA: Diagnosis not present

## 2015-05-21 DIAGNOSIS — J329 Chronic sinusitis, unspecified: Secondary | ICD-10-CM | POA: Diagnosis not present

## 2015-05-21 DIAGNOSIS — B9789 Other viral agents as the cause of diseases classified elsewhere: Secondary | ICD-10-CM

## 2015-05-21 MED ORDER — AMOXICILLIN 875 MG PO TABS: 875.0000 mg | ORAL_TABLET | Freq: Two times a day (BID) | ORAL | Status: DC

## 2015-05-21 NOTE — Patient Instructions (Signed)

## 2015-05-21 NOTE — Progress Notes (Signed)
Subjective:    Patient ID: Diana [name], female    DOB: [birth year], 55 y.o.   MRN: [id number]  HPI  Pt presents today with c/o mostly nonproductive cough and nasal drainage. Symptoms began on Sunday. She blows a lot of thick whitish/yellow mucus out of her nose daily. Complains of fatigue, lightheadedness and has been missing work due to exhaustion. Denies sore throat ear pain, facial pressure, difficulty breathing, chills, muscle aches or fever.  She was seen last week on 1/25 by Allie Bossier, NP. She was diagnosed with allergic rhinitis and told to continue Zyrtec and Nasacort. She called the office yesterday stating that she now has a productive cough and she was told to try Mucinex. She states that she has been trying OTC Robitussin DM with some relief and has not tried Mucinex. She does have a history of seasonal allergies. She has had sick contacts.   Review of Systems  Past Medical History  Diagnosis Date  . Hypertension   . Anxiety   . Ear infection     recurrent  . Allergy     Current Outpatient Prescriptions  Medication Sig Dispense Refill  . amoxicillin (AMOXIL) 875 MG tablet Take 1 tablet (875 mg total) by mouth 2 (two) times daily. 20 tablet 0  . cetirizine (ZYRTEC) 10 MG tablet Take 10 mg by mouth daily.    . clonazePAM (KLONOPIN) 0.5 MG tablet Take 0.5 mg by mouth as needed.     . gabapentin (NEURONTIN) 100 MG capsule Take 1 capsule by mouth 3 (three) times daily. Reported on 05/15/2015  0  . lisinopril-hydrochlorothiazide (PRINZIDE,ZESTORETIC) 10-12.5 MG tablet Take 1 tablet by mouth daily. 30 tablet 5  . Vitamin D, Ergocalciferol, (DRISDOL) 50000 UNITS CAPS capsule Take 50,000 Units by mouth every 7 (seven) days.   2   No current facility-administered medications for this visit.    No Known Allergies  Family History  Problem Relation Age of Onset  . Hypertension Mother     alive  . Arthritis Mother   . Lung cancer Father     deceased  . Heart disease Father    . Other      1 brother and 1 sister both alive an healthy  . Hypertension Maternal Grandmother     Social History   Social History  . Marital Status: Married    Spouse Name: N/A  . Number of Children: 3  . Years of Education: N/A   Occupational History  . customer service [employer]   Social History Main Topics  . Smoking status: Former Smoker    Types: Cigarettes    Quit date: 04/20/1985  . Smokeless tobacco: Not on file     Comment: remote tobacco use   . Alcohol Use: No  . Drug Use: No  . Sexual Activity: No   Other Topics Concern  . Not on file   Social History Narrative     Constitutional: Positive fatigue. Denies fever, malaise or headache.  HEENT: Positive runny nose and nasal congestion. Denies eye pain, eye redness, ear pain, ringing in the ears,or sore throat. Respiratory: Positive mostly nonproductive cough. Denies difficulty breathing or shortness of breath. Cardiovascular: Denies chest pain or chest tightness.  No other specific complaints in a complete review of systems (except as listed in HPI above).     Objective:   Physical Exam BP 122/72 mmHg  Pulse 107  Temp(Src) 97.7 F (36.5 C) (Oral)  Wt 224 lb (101.606 kg)  SpO2 98%  LMP 05/14/2015 Wt Readings from Last 3 Encounters:  05/21/15 224 lb (101.606 kg)  05/15/15 229 lb 6.4 oz (104.055 kg)  05/09/15 224 lb (101.606 kg)    General: Appears her stated age, in NAD. Skin: Warm, dry and intact. No rashes, lesions or ulcerations noted. HEENT: Head: normal shape and size; Eyes: sclera white, no icterus, conjunctiva pink; Ears: Tm's gray and intact, normal light reflex; Nose: mucosa pink and moist, septum midline; Throat/Mouth: Teeth present, mucosa pink and moist, no exudate, lesions or ulcerations noted.   Neck:  Neck supple, trachea midline. No masses, lumps or lymphadenopathy present.  Cardiovascular: Normal rate and rhythm. S1,S2 noted.  No murmur, rubs or gallops noted.    Pulmonary/Chest: Normal effort and positive vesicular breath sounds. No respiratory distress. No wheezes, rales or ronchi noted.   BMET    Component Value Date/Time   NA 138 04/16/2015 0923   K 3.9 04/16/2015 0923   CL 101 04/16/2015 0923   CO2 30 04/16/2015 0923   GLUCOSE 120* 04/16/2015 0923   BUN 11 04/16/2015 0923   CREATININE 0.66 04/16/2015 0923   CALCIUM 9.3 04/16/2015 0923   GFRNONAA >60 07/18/2010 0500   GFRAA  07/18/2010 0500    >60        The eGFR has been calculated using the MDRD equation. This calculation has not been validated in all clinical situations. eGFR's persistently <60 mL/min signify possible Chronic Kidney Disease.    Lipid Panel     Component Value Date/Time   CHOL 174 04/16/2015 0923   TRIG 76.0 04/16/2015 0923   HDL 18.20* 04/16/2015 0923   CHOLHDL 10 04/16/2015 0923   VLDL 15.2 04/16/2015 0923   LDLCALC 141* 04/16/2015 0923    CBC    Component Value Date/Time   WBC 5.2 04/16/2015 0923   RBC 4.87 04/16/2015 0923   HGB 15.0 04/16/2015 0923   HCT 44.2 04/16/2015 0923   PLT 214.0 04/16/2015 0923   MCV 90.6 04/16/2015 0923   MCH 25.2* 07/18/2010 0500   MCHC 33.9 04/16/2015 0923   RDW 14.1 04/16/2015 0923   LYMPHSABS 1.3 07/17/2010 1050   MONOABS 0.4 07/17/2010 1050   EOSABS 0.1 07/17/2010 1050   BASOSABS 0.0 07/17/2010 1050    Hgb A1C Lab Results  Component Value Date   HGBA1C 5.6 04/16/2015          Assessment & Plan:  Viral Sinusitis:  Encourage patient to continue Zyrtec and Nasal spray Instructed to take 12 hr Mucinex BID  Rx for Amoxicillin 875 BID for 10 days to be filled in 3 days if symptoms worsen Educated on signs of bacterial infection  RTC as needed.

## 2015-05-21 NOTE — Progress Notes (Signed)
Pre visit review using our clinic review tool, if applicable. No additional management support is needed unless otherwise documented below in the visit note. 

## 2015-05-23 ENCOUNTER — Encounter: Payer: Self-pay | Admitting: Internal Medicine

## 2015-05-23 ENCOUNTER — Ambulatory Visit (INDEPENDENT_AMBULATORY_CARE_PROVIDER_SITE_OTHER): Payer: BLUE CROSS/BLUE SHIELD | Admitting: Internal Medicine

## 2015-05-23 VITALS — BP 120/82 | HR 91 | Temp 98.0°F | Wt 218.0 lb

## 2015-05-23 DIAGNOSIS — R1013 Epigastric pain: Secondary | ICD-10-CM

## 2015-05-23 DIAGNOSIS — R1011 Right upper quadrant pain: Secondary | ICD-10-CM | POA: Diagnosis not present

## 2015-05-23 DIAGNOSIS — R110 Nausea: Secondary | ICD-10-CM | POA: Diagnosis not present

## 2015-05-23 LAB — AMYLASE: AMYLASE: 31 U/L (ref 27–131)

## 2015-05-23 LAB — H. PYLORI ANTIBODY, IGG: H PYLORI IGG: NEGATIVE

## 2015-05-23 LAB — LIPASE: LIPASE: 11 U/L (ref 11.0–59.0)

## 2015-05-23 NOTE — Patient Instructions (Signed)

## 2015-05-23 NOTE — Progress Notes (Signed)
Pre visit review using our clinic review tool, if applicable. No additional management support is needed unless otherwise documented below in the visit note. 

## 2015-05-23 NOTE — Progress Notes (Signed)
Subjective:    Patient ID: Diana [name], female    DOB: 1960/10/15, 55 y.o.   MRN: [id number]  HPI  Pt presents to the clinic today with c/o epigastric pain that radiates to her back. This originally started 3 weeks ago, but reoccurred yesterday. This pain is intermittent. Yesterday she woke up with nausea and fatigue. Later in the day, she ate fried chicken tenders with fries and had epigastric pain shortly after. She also had dizziness, blurry vision, mid-sternal chest discomfort, chills, sweats and diarrhea. She denies vomiting or blood in her stool. She had a similar episode 3 weeks prior after eating a fatty meal. No history of gall stones.She was taking Prilosec for 2 weeks but has since stopped. She did not feel like it was helping her symptoms. She had a normal CMET 03/2015.   Review of Systems      Past Medical History  Diagnosis Date  . Hypertension   . Anxiety   . Ear infection     recurrent  . Allergy     Current Outpatient Prescriptions  Medication Sig Dispense Refill  . cetirizine (ZYRTEC) 10 MG tablet Take 10 mg by mouth daily.    . clonazePAM (KLONOPIN) 0.5 MG tablet Take 0.5 mg by mouth as needed.     . gabapentin (NEURONTIN) 100 MG capsule Take 1 capsule by mouth 3 (three) times daily. Reported on 05/15/2015  0  . lisinopril-hydrochlorothiazide (PRINZIDE,ZESTORETIC) 10-12.5 MG tablet Take 1 tablet by mouth daily. 30 tablet 5  . Vitamin D, Ergocalciferol, (DRISDOL) 50000 UNITS CAPS capsule Take 50,000 Units by mouth every 7 (seven) days.   2  . amoxicillin (AMOXIL) 875 MG tablet Take 1 tablet (875 mg total) by mouth 2 (two) times daily. (Patient not taking: Reported on 05/23/2015) 20 tablet 0   No current facility-administered medications for this visit.    No Known Allergies  Family History  Problem Relation Age of Onset  . Hypertension Mother     alive  . Arthritis Mother   . Lung cancer Father     deceased  . Heart disease Father   . Other      1  brother and 1 sister both alive an healthy  . Hypertension Maternal Grandmother     Social History   Social History  . Marital Status: Married    Spouse Name: N/A  . Number of Children: 3  . Years of Education: N/A   Occupational History  . customer service [employer]   Social History Main Topics  . Smoking status: Former Smoker    Types: Cigarettes    Quit date: 04/20/1985  . Smokeless tobacco: Not on file     Comment: remote tobacco use   . Alcohol Use: No  . Drug Use: No  . Sexual Activity: No   Other Topics Concern  . Not on file   Social History Narrative    Constitutional: Positive fatigue and body aches. Denies fever, headache or abrupt weight changes.  HEENT: Positive blurry vision Respiratory: Denies difficulty breathing, shortness of breath, cough or sputum production.   Cardiovascular: Denies chest tightness, palpitations or swelling in the hands or feet.  Gastrointestinal: Positive epigastric pain radiating to her back, substernal chest pain, nausea and diarrhea. Denies bloating, constipation or blood in the stool.   Neurological: Positive dizziness.   No other specific complaints in a complete review of systems (except as listed in HPI above).  Objective:   Physical Exam BP 120/82  mmHg  Pulse 91  Temp(Src) 98 F (36.7 C) (Oral)  Wt 218 lb (98.884 kg)  SpO2 98%  LMP 05/14/2015 Wt Readings from Last 3 Encounters:  05/23/15 218 lb (98.884 kg)  05/21/15 224 lb (101.606 kg)  05/15/15 229 lb 6.4 oz (104.055 kg)    General: Appears her stated age, obese in NAD. Skin: Warm, dry and intact. No rashes, lesions or ulcerations noted. HEENT: Head: normal shape and size; Eyes: sclera white, no icterus, conjunctiva pink. Neck:  Neck supple, trachea midline. No masses, lumps or lymphadenopathy present.  Cardiovascular: Normal rate and rhythm. S1,S2 noted.  No murmur, rubs or gallops noted.  Pulmonary/Chest: Normal effort and positive vesicular breath  sounds. No respiratory distress. No wheezes, rales or ronchi noted.  Abdomen: Soft and tender in LLQ, RLQ, umbilical, RUQ and epigastric regions. Normal bowel sounds. No distention or masses noted. Liver, spleen and kidneys non palpable. Negative Murphy's.     BMET    Component Value Date/Time   NA 138 04/16/2015 0923   K 3.9 04/16/2015 0923   CL 101 04/16/2015 0923   CO2 30 04/16/2015 0923   GLUCOSE 120* 04/16/2015 0923   BUN 11 04/16/2015 0923   CREATININE 0.66 04/16/2015 0923   CALCIUM 9.3 04/16/2015 0923   GFRNONAA >60 07/18/2010 0500   GFRAA  07/18/2010 0500    >60        The eGFR has been calculated using the MDRD equation. This calculation has not been validated in all clinical situations. eGFR's persistently <60 mL/min signify possible Chronic Kidney Disease.    Lipid Panel     Component Value Date/Time   CHOL 174 04/16/2015 0923   TRIG 76.0 04/16/2015 0923   HDL 18.20* 04/16/2015 0923   CHOLHDL 10 04/16/2015 0923   VLDL 15.2 04/16/2015 0923   LDLCALC 141* 04/16/2015 0923    CBC    Component Value Date/Time   WBC 5.2 04/16/2015 0923   RBC 4.87 04/16/2015 0923   HGB 15.0 04/16/2015 0923   HCT 44.2 04/16/2015 0923   PLT 214.0 04/16/2015 0923   MCV 90.6 04/16/2015 0923   MCH 25.2* 07/18/2010 0500   MCHC 33.9 04/16/2015 0923   RDW 14.1 04/16/2015 0923   LYMPHSABS 1.3 07/17/2010 1050   MONOABS 0.4 07/17/2010 1050   EOSABS 0.1 07/17/2010 1050   BASOSABS 0.0 07/17/2010 1050    Hgb A1C Lab Results  Component Value Date   HGBA1C 5.6 04/16/2015          Assessment & Plan:  Abdominal pain, nausea:  ? GERD, vs gallstones CMET, Amylase, Lipase and H. Pylori today RUQ ultrasound ordered to r/o gallstones Restart Prilosec daily  RTC as needed or if symptoms persist or worsen

## 2015-05-26 ENCOUNTER — Emergency Department (HOSPITAL_COMMUNITY): Payer: BLUE CROSS/BLUE SHIELD

## 2015-05-26 ENCOUNTER — Encounter (HOSPITAL_COMMUNITY): Payer: Self-pay | Admitting: *Deleted

## 2015-05-26 ENCOUNTER — Emergency Department (HOSPITAL_COMMUNITY)
Admission: EM | Admit: 2015-05-26 | Discharge: 2015-05-26 | Disposition: A | Discharge status: Home / Self Care | Payer: BLUE CROSS/BLUE SHIELD | Attending: Emergency Medicine | Admitting: Emergency Medicine

## 2015-05-26 DIAGNOSIS — I10 Essential (primary) hypertension: Secondary | ICD-10-CM | POA: Diagnosis not present

## 2015-05-26 DIAGNOSIS — F419 Anxiety disorder, unspecified: Secondary | ICD-10-CM | POA: Diagnosis not present

## 2015-05-26 DIAGNOSIS — R110 Nausea: Secondary | ICD-10-CM | POA: Diagnosis not present

## 2015-05-26 DIAGNOSIS — Z87891 Personal history of nicotine dependence: Secondary | ICD-10-CM | POA: Insufficient documentation

## 2015-05-26 DIAGNOSIS — Z8669 Personal history of other diseases of the nervous system and sense organs: Secondary | ICD-10-CM | POA: Diagnosis not present

## 2015-05-26 DIAGNOSIS — Z79899 Other long term (current) drug therapy: Secondary | ICD-10-CM | POA: Insufficient documentation

## 2015-05-26 DIAGNOSIS — R1013 Epigastric pain: Secondary | ICD-10-CM | POA: Insufficient documentation

## 2015-05-26 LAB — COMPREHENSIVE METABOLIC PANEL
ALK PHOS: 70 U/L (ref 38–126)
ALT: 11 U/L — ABNORMAL LOW (ref 14–54)
ANION GAP: 12 (ref 5–15)
AST: 13 U/L — ABNORMAL LOW (ref 15–41)
Albumin: 4.3 g/dL (ref 3.5–5.0)
BUN: 17 mg/dL (ref 6–20)
CALCIUM: 9.4 mg/dL (ref 8.9–10.3)
CHLORIDE: 98 mmol/L — AB (ref 101–111)
CO2: 26 mmol/L (ref 22–32)
Creatinine, Ser: 0.79 mg/dL (ref 0.44–1.00)
GFR calc non Af Amer: >60 mL/min (ref >60)
Glucose, Bld: 142 mg/dL — ABNORMAL HIGH (ref 65–99)
Potassium: 3.8 mmol/L (ref 3.5–5.1)
SODIUM: 136 mmol/L (ref 135–145)
Total Bilirubin: 2.5 mg/dL — ABNORMAL HIGH (ref 0.3–1.2)
Total Protein: 7.4 g/dL (ref 6.5–8.1)

## 2015-05-26 LAB — URINALYSIS, ROUTINE W REFLEX MICROSCOPIC
Glucose, UA: NEGATIVE mg/dL
HGB URINE DIPSTICK: NEGATIVE
Ketones, ur: NEGATIVE mg/dL
Leukocytes, UA: NEGATIVE
Nitrite: NEGATIVE
Protein, ur: NEGATIVE mg/dL
SPECIFIC GRAVITY, URINE: 1.022 (ref 1.005–1.030)
pH: 5 (ref 5.0–8.0)

## 2015-05-26 LAB — I-STAT TROPONIN, ED: TROPONIN I, POC: 0.01 ng/mL (ref 0.00–0.08)

## 2015-05-26 LAB — CBC
HCT: 45 % (ref 36.0–46.0)
HEMOGLOBIN: 16 g/dL — AB (ref 12.0–15.0)
MCH: 31.6 pg (ref 26.0–34.0)
MCHC: 35.6 g/dL (ref 30.0–36.0)
MCV: 88.9 fL (ref 78.0–100.0)
Platelets: 257 10*3/uL (ref 150–400)
RBC: 5.06 MIL/uL (ref 3.87–5.11)
RDW: 13.1 % (ref 11.5–15.5)
WBC: 8.4 10*3/uL (ref 4.0–10.5)

## 2015-05-26 LAB — LIPASE, BLOOD: LIPASE: 28 U/L (ref 11–51)

## 2015-05-26 MED ORDER — ONDANSETRON 4 MG PO TBDP: 4.0000 mg | ORAL_TABLET | Freq: Three times a day (TID) | ORAL | Status: DC | PRN

## 2015-05-26 MED ORDER — ONDANSETRON HCL 4 MG/2ML IJ SOLN
4.0000 mg | Freq: Once | INTRAMUSCULAR | Status: AC
  Administered 2015-05-26: 4 mg via INTRAVENOUS
  Filled 2015-05-26: qty 2

## 2015-05-26 MED ORDER — SODIUM CHLORIDE 0.9 % IV BOLUS (SEPSIS)
1000.0000 mL | Freq: Once | INTRAVENOUS | Status: AC
  Administered 2015-05-26: 1000 mL via INTRAVENOUS

## 2015-05-26 MED ORDER — MORPHINE SULFATE (PF) 4 MG/ML IV SOLN
4.0000 mg | Freq: Once | INTRAVENOUS | Status: AC
  Administered 2015-05-26: 4 mg via INTRAVENOUS
  Filled 2015-05-26: qty 1

## 2015-05-26 MED ORDER — HYDROCODONE-ACETAMINOPHEN 5-325 MG PO TABS: 1.0000 | ORAL_TABLET | ORAL | Status: DC | PRN

## 2015-05-26 NOTE — ED Notes (Signed)
Pt states upper abdominal that radiates through her back in between her shoulder blades. Pt reports after eating the pain becomes worse and she becomes nauseated.

## 2015-05-26 NOTE — ED Provider Notes (Signed)
CSN: [id number]     Arrival date 05/26/15  1930 History   First MD Initiated Contact with Patient 05/26/15 2032     Chief Complaint  Patient presents with  . Abdominal Pain     (Consider location/radiation/quality/duration/timing/severity/associated sxs/prior Treatment) Patient is a 55 y.o. female presenting with abdominal pain. The history is provided by the patient and medical records.  Abdominal Pain Associated symptoms: nausea     55 year old female with history of hypertension, anxiety, seasonal allergies, presenting to the ED for abdominal pain. Patient states for the past 5 days she's been having issues with epigastric abdominal pain. She states this is more pronounced after eating and pain seems to radiate to her back. She reports severe nausea after eating as well.  States she ate fried chicken, french fries, and toast which seemed to make symptoms worst of all.  She's not had any episodes of vomiting. No fever or chills. Patient does report she was diagnosed with IBS several years ago, however this pain is unlike any other she has ever felt. She denies any chest pain or shortness of breath. She saw her PCP on 05-23-15 and had lab work done which she was told was normal.  She states her PCP was planning to send her for an ultrasound, patient thinks this was scheduled for Tuesday.  Patient reports she has been taking Prilosec for the past 4 days, has not noticed any significant relief from this.  VSS.  Past Medical History  Diagnosis Date  . Hypertension   . Anxiety   . Ear infection     recurrent  . Allergy    Past Surgical History  Procedure Laterality Date  . Tubal ligation      bilateral  . Tonsillectomy     Family History  Problem Relation Age of Onset  . Hypertension Mother     alive  . Arthritis Mother   . Lung cancer Father     deceased  . Heart disease Father   . Other      1 brother and 1 sister both alive an healthy  . Hypertension Maternal Grandmother     Social History  Substance Use Topics  . Smoking status: Former Smoker    Types: Cigarettes    Quit date: 04/20/1985  . Smokeless tobacco: None     Comment: remote tobacco use   . Alcohol Use: No   OB History    No data available     Review of Systems  Gastrointestinal: Positive for nausea and abdominal pain.  All other systems reviewed and are negative.     Allergies  Review of patient's allergies indicates no known allergies.  Home Medications   Prior to Admission medications   Medication Sig Start Date End Date Taking? Authorizing Provider  cetirizine (ZYRTEC) 10 MG tablet Take 10 mg by mouth daily.   Yes Historical Provider, MD  clonazePAM (KLONOPIN) 0.5 MG tablet Take 0.5 mg by mouth daily as needed for anxiety.  01/30/15  Yes Historical Provider, MD  lisinopril-hydrochlorothiazide (PRINZIDE,ZESTORETIC) 10-12.5 MG tablet Take 1 tablet by mouth daily. 04/16/15  Yes [name], NP  omeprazole (PRILOSEC) 20 MG capsule Take 20 mg by mouth daily.   Yes Historical Provider, MD  amoxicillin (AMOXIL) 875 MG tablet Take 1 tablet (875 mg total) by mouth 2 (two) times daily. Patient not taking: Reported on 05/23/2015 05/21/15   [name], NP   BP 142/88 mmHg  Pulse 92  Temp(Src) 98.4 F (36.9 C) (  Oral)  Resp 19  SpO2 97%  LMP 05/14/2015   Physical Exam  Constitutional: She is oriented to person, place, and time. She appears well-developed and well-nourished. No distress.  HENT:  Head: Normocephalic and atraumatic.  Mouth/Throat: Oropharynx is clear and moist.  Eyes: Conjunctivae and EOM are normal. Pupils are equal, round, and reactive to light.  Neck: Normal range of motion. Neck supple.  Cardiovascular: Normal rate, regular rhythm and normal heart sounds.   Pulmonary/Chest: Effort normal and breath sounds normal. No respiratory distress. She has no wheezes.  Abdominal: Soft. Bowel sounds are normal. There is tenderness in the epigastric area. There is no  rigidity, no guarding and no CVA tenderness.  Musculoskeletal: Normal range of motion.  Neurological: She is alert and oriented to person, place, and time.  Skin: Skin is warm and dry. She is not diaphoretic.  Psychiatric: She has a normal mood and affect.  Nursing note and vitals reviewed.   ED Course  Procedures (including critical care time) Labs Review Labs Reviewed  COMPREHENSIVE METABOLIC PANEL - Abnormal; Notable for the following:    Chloride 98 (*)    Glucose, Bld 142 (*)    AST 13 (*)    ALT 11 (*)    Total Bilirubin 2.5 (*)    All other components within normal limits  CBC - Abnormal; Notable for the following:    Hemoglobin 16.0 (*)    All other components within normal limits  URINALYSIS, ROUTINE W REFLEX MICROSCOPIC (NOT AT [facility]) - Abnormal; Notable for the following:    APPearance CLOUDY (*)    Bilirubin Urine SMALL (*)    All other components within normal limits  LIPASE, BLOOD  I-STAT TROPOININ, ED    Imaging Review US Abdomen Complete  05/26/2015  CLINICAL DATA:  55 year old female with right upper quadrant abdominal pain radiating to the back. EXAM: ABDOMEN ULTRASOUND COMPLETE COMPARISON:  HIDA scan dated 04/22/2009 FINDINGS: Gallbladder: No gallstones or wall thickening visualized. No sonographic Murphy sign noted by sonographer. Common bile duct: Diameter: 2 mm Liver: No focal lesion identified. Within normal limits in parenchymal echogenicity. IVC: No abnormality visualized. Pancreas: Visualized portion unremarkable. Spleen: Size and appearance within normal limits. Right Kidney: Length: 10 cm. Echogenicity within normal limits. No mass or hydronephrosis visualized. Left Kidney: Length: 11 cm. Echogenicity within normal limits. No mass or hydronephrosis visualized. Abdominal aorta: No aneurysm visualized. Other findings: None. IMPRESSION: Unremarkable abdominal ultrasound. Electronically Signed   By: Elgie Collard M.D.   On: 05/26/2015 21:55   I have  personally reviewed and evaluated these images and lab results as part of my medical decision-making.   EKG Interpretation None      MDM   Final diagnoses:  Epigastric pain  Nausea   89 -year-old female here for intermittent abdominal pain for the past 5 days.  Patient is afebrile, nontoxic in appearance. She does have some tenderness of her epigastrium without rebound or guarding. Remainder of exam is benign.  Lab work as above, largely within normal limits. Ultrasound was obtained, no acute findings identified. Patient was treated here with morphine and Zofran with improvement of her symptoms. She is able to tolerate fluids without significant pain or nausea. Patient's symptoms may very well be rate related to acid reflux/GERD.  Recommend to continue prilosec, may need to add in TUMS/rolaids/maalox, etc for breakthrough.  Short supply pain and nausea meds given.  Follow-up with GI given, also recommended to follow-up with PCP.  Discussed plan with patient,  he/she acknowledged understanding and agreed with plan of care.  Return precautions given for new or worsening symptoms.  Garlon Hatchet, PA-C 05/26/15 2347  Mancel Bale, MD 05/27/15 1225

## 2015-05-26 NOTE — Discharge Instructions (Signed)
Take the prescribed medication as directed.  Recommend to continue Prilosec daily. He may need to take over-the-counter Tums, Rolaids, Maalox, etc. For breakthrough. Follow-up with your primary care physician. I have also given you referral to GI-- call to make appt. Return to the ED for new or worsening symptoms.

## 2015-05-27 ENCOUNTER — Telehealth: Payer: Self-pay | Admitting: Internal Medicine

## 2015-05-27 NOTE — Telephone Encounter (Signed)
Please cancel ultrasound.

## 2015-05-27 NOTE — Telephone Encounter (Signed)
Amber from [facility] imaging called, pt was seen in ER at [facility] on 05/26/15 and an US Abdominal Complete was done.  Please review and advise whether we need to cancel the US Abdominal limited previously scheduled by PCP for 05/28/15

## 2015-05-28 ENCOUNTER — Ambulatory Visit (INDEPENDENT_AMBULATORY_CARE_PROVIDER_SITE_OTHER): Payer: BLUE CROSS/BLUE SHIELD | Admitting: Internal Medicine

## 2015-05-28 ENCOUNTER — Ambulatory Visit: Payer: BLUE CROSS/BLUE SHIELD

## 2015-05-28 ENCOUNTER — Encounter: Payer: Self-pay | Admitting: Internal Medicine

## 2015-05-28 VITALS — BP 122/80 | HR 87 | Temp 98.4°F | Wt 218.0 lb

## 2015-05-28 DIAGNOSIS — R1013 Epigastric pain: Secondary | ICD-10-CM

## 2015-05-28 DIAGNOSIS — R110 Nausea: Secondary | ICD-10-CM | POA: Diagnosis not present

## 2015-05-28 MED ORDER — SUCRALFATE 1 G PO TABS: 1.0000 g | ORAL_TABLET | Freq: Three times a day (TID) | ORAL | Status: DC

## 2015-05-28 NOTE — Patient Instructions (Signed)
Nausea, Adult °Nausea is the feeling that you have an upset stomach or have to vomit. Nausea by itself is not likely a serious concern, but it may be an early sign of more serious medical problems. As nausea gets worse, it can lead to vomiting. If vomiting develops, there is the risk of dehydration.  °CAUSES  °· Viral infections. °· Food poisoning. °· Medicines. °· Pregnancy. °· Motion sickness. °· Migraine headaches. °· Emotional distress. °· Severe pain from any source. °· Alcohol intoxication. °HOME CARE INSTRUCTIONS °· Get plenty of rest. °· Ask your caregiver about specific rehydration instructions. °· Eat small amounts of food and sip liquids more often. °· Take all medicines as told by your caregiver. °SEEK MEDICAL CARE IF: °· You have not improved after 2 days, or you get worse. °· You have a headache. °SEEK IMMEDIATE MEDICAL CARE IF:  °· You have a fever. °· You faint. °· You keep vomiting or have blood in your vomit. °· You are extremely weak or dehydrated. °· You have dark or bloody stools. °· You have severe chest or abdominal pain. °MAKE SURE YOU: °· Understand these instructions. °· Will watch your condition. °· Will get help right away if you are not doing well or get worse. °  °This information is not intended to replace advice given to you by your health care provider. Make sure you discuss any questions you have with your health care provider. °  °Document Released: 05/14/2004 Document Revised: 04/27/2014 Document Reviewed: 12/17/2010 °Elsevier Interactive Patient Education ©2016 Elsevier Inc. ° °

## 2015-05-28 NOTE — Progress Notes (Signed)
Subjective:    Patient ID: Diana [name], female    DOB: [birth year], 55 y.o.   MRN: [id number]  HPI  Pt presents to the clinic today for follow up of her hospital visit. She was seen in the office on 05/23/15 following an episode of epigastric and RUQ pain with nausea. CMET, Amylase, Lipase, and H.Pylori all came back WNL. She has been taking Prilosec since her last PCP visit on 05/23/15. Pt was then seen in the ER where they completed an abdominal US which showed no abnormality. She was treated with Morphine and Zofran with improvement to her symptoms. ED stated symptoms could be due to GERD breakthrough and recommended to continue Prilosec and to add TUMS/Rolaids/Maalox PRN for breakthrough symptoms. She states the Tums PRN has not helped. Pt was told to follow up with GI specialist and PCP. She states that she just feels sick like she could throw up. She is not in any pain today. After a meal last night her stomach in a lot of pain, since then she has felt sick. She has not been eating much at all due to the pain. She denies fever, palpitations, chills or body aches. She says she has not been able to go to work because she can not leave her house due to the pain and fatigue.    Review of Systems      Past Medical History  Diagnosis Date  . Hypertension   . Anxiety   . Ear infection     recurrent  . Allergy     Current Outpatient Prescriptions  Medication Sig Dispense Refill  . cetirizine (ZYRTEC) 10 MG tablet Take 10 mg by mouth daily.    . clonazePAM (KLONOPIN) 0.5 MG tablet Take 0.5 mg by mouth daily as needed for anxiety.     Marland Kitchen HYDROcodone-acetaminophen (NORCO/VICODIN) 5-325 MG tablet Take 1 tablet by mouth every 4 (four) hours as needed. 12 tablet 0  . lisinopril-hydrochlorothiazide (PRINZIDE,ZESTORETIC) 10-12.5 MG tablet Take 1 tablet by mouth daily. 30 tablet 5  . omeprazole (PRILOSEC) 20 MG capsule Take 20 mg by mouth daily.    . ondansetron (ZOFRAN ODT) 4 MG disintegrating  tablet Take 1 tablet (4 mg total) by mouth every 8 (eight) hours as needed for nausea. 10 tablet 0  . amoxicillin (AMOXIL) 875 MG tablet Take 1 tablet (875 mg total) by mouth 2 (two) times daily. (Patient not taking: Reported on 05/28/2015) 20 tablet 0  . sucralfate (CARAFATE) 1 g tablet Take 1 tablet (1 g total) by mouth 4 (four) times daily -  with meals and at bedtime. 120 tablet 0   No current facility-administered medications for this visit.    No Known Allergies  Family History  Problem Relation Age of Onset  . Hypertension Mother     alive  . Arthritis Mother   . Lung cancer Father     deceased  . Heart disease Father   . Other      1 brother and 1 sister both alive an healthy  . Hypertension Maternal Grandmother     Social History   Social History  . Marital Status: Married    Spouse Name: N/A  . Number of Children: 3  . Years of Education: N/A   Occupational History  . customer service [employer]   Social History Main Topics  . Smoking status: Former Smoker    Types: Cigarettes    Quit date: 04/20/1985  . Smokeless tobacco: Not on file  Comment: remote tobacco use   . Alcohol Use: No  . Drug Use: No  . Sexual Activity: No   Other Topics Concern  . Not on file   Social History Narrative     Constitutional: Positive fatigue. Denies fever, headache or abrupt weight changes.  Cardiovascular: Denies chest pain, chest tightness, palpitations or swelling in the hands or feet.  Gastrointestinal: Positive nausea and abdominal pain. Denies bloating, constipation, diarrhea or blood in the stool.   No other specific complaints in a complete review of systems (except as listed in HPI above).  Objective:   Physical Exam  BP 122/80 mmHg  Pulse 87  Temp(Src) 98.4 F (36.9 C) (Oral)  Wt 218 lb (98.884 kg)  SpO2 97%  LMP 05/14/2015 Wt Readings from Last 3 Encounters:  05/28/15 218 lb (98.884 kg)  05/23/15 218 lb (98.884 kg)  05/21/15 224 lb (101.606 kg)     General: Appears her stated age, in NAD. Skin: Warm, dry and intact. No rashes, lesions or ulcerations noted.  Cardiovascular: Normal rate and rhythm. S1,S2 noted.  No murmur, rubs or gallops noted. No JVD or BLE edema. No carotid bruits noted. Abdomen: Soft and tender in RUQ, LUQ and epigastric area. Normal bowel sounds. No distention or masses noted. Liver, spleen and kidneys non palpable.  BMET    Component Value Date/Time   NA 136 05/26/2015 2059   K 3.8 05/26/2015 2059   CL 98* 05/26/2015 2059   CO2 26 05/26/2015 2059   GLUCOSE 142* 05/26/2015 2059   BUN 17 05/26/2015 2059   CREATININE 0.79 05/26/2015 2059   CALCIUM 9.4 05/26/2015 2059   GFRNONAA >60 05/26/2015 2059   GFRAA >60 05/26/2015 2059    Lipid Panel     Component Value Date/Time   CHOL 174 04/16/2015 0923   TRIG 76.0 04/16/2015 0923   HDL 18.20* 04/16/2015 0923   CHOLHDL 10 04/16/2015 0923   VLDL 15.2 04/16/2015 0923   LDLCALC 141* 04/16/2015 0923    CBC    Component Value Date/Time   WBC 8.4 05/26/2015 2059   RBC 5.06 05/26/2015 2059   HGB 16.0* 05/26/2015 2059   HCT 45.0 05/26/2015 2059   PLT 257 05/26/2015 2059   MCV 88.9 05/26/2015 2059   MCH 31.6 05/26/2015 2059   MCHC 35.6 05/26/2015 2059   RDW 13.1 05/26/2015 2059   LYMPHSABS 1.3 07/17/2010 1050   MONOABS 0.4 07/17/2010 1050   EOSABS 0.1 07/17/2010 1050   BASOSABS 0.0 07/17/2010 1050    Hgb A1C Lab Results  Component Value Date   HGBA1C 5.6 04/16/2015         Assessment & Plan:  Abdominal pain:  All labs, imaging and ER notes reviewed.  Carafate 1g PO 4 times a day with meals  Continue Prilosec daily and Tums PRN  Referral to gastroenterologist  RTC as needed.

## 2015-05-28 NOTE — Progress Notes (Signed)
Pre visit review using our clinic review tool, if applicable. No additional management support is needed unless otherwise documented below in the visit note. 

## 2015-05-30 ENCOUNTER — Other Ambulatory Visit: Payer: Self-pay

## 2015-05-30 MED ORDER — OMEPRAZOLE 20 MG PO CPDR: 20.0000 mg | DELAYED_RELEASE_CAPSULE | Freq: Every day | ORAL | Status: DC

## 2015-05-30 MED ORDER — ONDANSETRON 4 MG PO TBDP: 4.0000 mg | ORAL_TABLET | Freq: Three times a day (TID) | ORAL | Status: DC | PRN

## 2015-05-30 NOTE — Telephone Encounter (Signed)
Pt left /vm;pt was seen 05/28/15 and has appt to see Dr Randa Evens at [facility] on 06/11/15. Pt request refill of ondansetron (was given # 10 on 05/26/15 at ED) and request rx for prilosec rather than getting OTC.Please advise.[facility].

## 2015-06-13 ENCOUNTER — Encounter: Payer: Self-pay | Admitting: Internal Medicine

## 2015-06-13 ENCOUNTER — Ambulatory Visit (INDEPENDENT_AMBULATORY_CARE_PROVIDER_SITE_OTHER): Payer: BLUE CROSS/BLUE SHIELD | Admitting: Internal Medicine

## 2015-06-13 VITALS — BP 118/68 | HR 71 | Temp 98.1°F | Wt 221.0 lb

## 2015-06-13 DIAGNOSIS — K0889 Other specified disorders of teeth and supporting structures: Secondary | ICD-10-CM

## 2015-06-13 NOTE — Progress Notes (Signed)
Subjective:    Patient ID: Diana [name], female    DOB: 17-Oct-1960, 55 y.o.   MRN: [id number]  HPI  Pt presents to the clinic today with c/o dental pain. This started 2 weeks ago. The pain is in between her canine and surrounding tooth. She describes the pain as sharp and stabbing. If she drinks anything cold, it is much more severe. She saw her dentist for the same. They did xrays and told her that her teeth were fine and that she needed to be evaluated for a sinus infection. She denies headache, facial pain and pressure, nasal congestion or runny nose. She has not taken anything OTC for this.  She also needs a work note releasing her to go back to work after her stomach issues.  Review of Systems      Past Medical History  Diagnosis Date  . Hypertension   . Anxiety   . Ear infection     recurrent  . Allergy     Current Outpatient Prescriptions  Medication Sig Dispense Refill  . cetirizine (ZYRTEC) 10 MG tablet Take 10 mg by mouth daily.    . clonazePAM (KLONOPIN) 0.5 MG tablet Take 0.5 mg by mouth daily as needed for anxiety.     Marland Kitchen lisinopril-hydrochlorothiazide (PRINZIDE,ZESTORETIC) 10-12.5 MG tablet Take 1 tablet by mouth daily. 30 tablet 5  . omeprazole (PRILOSEC) 20 MG capsule Take 1 capsule (20 mg total) by mouth daily. 30 capsule 0  . sucralfate (CARAFATE) 1 g tablet Take 1 tablet (1 g total) by mouth 4 (four) times daily -  with meals and at bedtime. 120 tablet 0  . ondansetron (ZOFRAN ODT) 4 MG disintegrating tablet Take 1 tablet (4 mg total) by mouth every 8 (eight) hours as needed for nausea. (Patient not taking: Reported on 06/13/2015) 10 tablet 0   No current facility-administered medications for this visit.    No Known Allergies  Family History  Problem Relation Age of Onset  . Hypertension Mother     alive  . Arthritis Mother   . Lung cancer Father     deceased  . Heart disease Father   . Other      1 brother and 1 sister both alive an healthy  .  Hypertension Maternal Grandmother     Social History   Social History  . Marital Status: Married    Spouse Name: N/A  . Number of Children: 3  . Years of Education: N/A   Occupational History  . customer service [employer]   Social History Main Topics  . Smoking status: Former Smoker    Types: Cigarettes    Quit date: 04/20/1985  . Smokeless tobacco: Not on file     Comment: remote tobacco use   . Alcohol Use: No  . Drug Use: No  . Sexual Activity: No   Other Topics Concern  . Not on file   Social History Narrative     Constitutional: Denies fever, malaise, fatigue, headache or abrupt weight changes.  HEENT: Pt reports dental pain. Denies eye pain, eye redness, ear pain, ringing in the ears, wax buildup, runny nose, nasal congestion, bloody nose, or sore throat. Respiratory: Denies difficulty breathing, shortness of breath, cough or sputum production.    No other specific complaints in a complete review of systems (except as listed in HPI above).  Objective:   Physical Exam  BP 118/68 mmHg  Pulse 71  Temp(Src) 98.1 F (36.7 C) (Oral)  Wt 221  lb (100.245 kg)  SpO2 98%  LMP 05/14/2015 Wt Readings from Last 3 Encounters:  06/13/15 221 lb (100.245 kg)  05/28/15 218 lb (98.884 kg)  05/23/15 218 lb (98.884 kg)    General: Appears her stated age, in NAD. HEENT: Head: normal shape and size, no sinus tenderness noted;  Nose: mucosa pink and moist, septum midline; Throat/Mouth: Mucosa pink and moist, no exudate, lesions or ulcerations noted. The area of concern shows no abnormality. Gum is pink and intact. Neck:  No adenopathy noted. Cardiovascular: Normal rate and rhythm. S1,S2 noted.   Pulmonary/Chest: Normal effort and positive vesicular breath sounds. No respiratory distress. No wheezes, rales or ronchi noted.   BMET    Component Value Date/Time   NA 136 05/26/2015 2059   K 3.8 05/26/2015 2059   CL 98* 05/26/2015 2059   CO2 26 05/26/2015 2059   GLUCOSE  142* 05/26/2015 2059   BUN 17 05/26/2015 2059   CREATININE 0.79 05/26/2015 2059   CALCIUM 9.4 05/26/2015 2059   GFRNONAA >60 05/26/2015 2059   GFRAA >60 05/26/2015 2059    Lipid Panel     Component Value Date/Time   CHOL 174 04/16/2015 0923   TRIG 76.0 04/16/2015 0923   HDL 18.20* 04/16/2015 0923   CHOLHDL 10 04/16/2015 0923   VLDL 15.2 04/16/2015 0923   LDLCALC 141* 04/16/2015 0923    CBC    Component Value Date/Time   WBC 8.4 05/26/2015 2059   RBC 5.06 05/26/2015 2059   HGB 16.0* 05/26/2015 2059   HCT 45.0 05/26/2015 2059   PLT 257 05/26/2015 2059   MCV 88.9 05/26/2015 2059   MCH 31.6 05/26/2015 2059   MCHC 35.6 05/26/2015 2059   RDW 13.1 05/26/2015 2059   LYMPHSABS 1.3 07/17/2010 1050   MONOABS 0.4 07/17/2010 1050   EOSABS 0.1 07/17/2010 1050   BASOSABS 0.0 07/17/2010 1050    Hgb A1C Lab Results  Component Value Date   HGBA1C 5.6 04/16/2015         Assessment & Plan:   Dental pain:  Clearly not a sinus infection Advised her if pain persist, she should follow up with her dentist Try Ibuprofen only if pain is severe Avoid cold drinks or food  Work note provided to return to work  RTC as needed or if symptoms persist or worsen

## 2015-06-13 NOTE — Progress Notes (Signed)
Pre visit review using our clinic review tool, if applicable. No additional management support is needed unless otherwise documented below in the visit note. 

## 2015-06-13 NOTE — Patient Instructions (Signed)

## 2015-07-10 ENCOUNTER — Other Ambulatory Visit: Payer: Self-pay | Admitting: Internal Medicine

## 2015-07-10 NOTE — Telephone Encounter (Signed)
Is pt supposed to continue Carafate? Please advise

## 2015-07-10 NOTE — Telephone Encounter (Signed)
GI at [facility] prescribed, this request needs to go to them

## 2015-07-10 NOTE — Telephone Encounter (Signed)
last filled 05/28/2015 by you

## 2015-07-11 ENCOUNTER — Ambulatory Visit (INDEPENDENT_AMBULATORY_CARE_PROVIDER_SITE_OTHER): Payer: BLUE CROSS/BLUE SHIELD | Admitting: Internal Medicine

## 2015-07-11 ENCOUNTER — Telehealth: Payer: Self-pay | Admitting: Internal Medicine

## 2015-07-11 ENCOUNTER — Encounter: Payer: Self-pay | Admitting: Internal Medicine

## 2015-07-11 VITALS — BP 118/80 | HR 98 | Temp 97.8°F | Wt 226.5 lb

## 2015-07-11 DIAGNOSIS — R197 Diarrhea, unspecified: Secondary | ICD-10-CM

## 2015-07-11 DIAGNOSIS — R110 Nausea: Secondary | ICD-10-CM

## 2015-07-11 DIAGNOSIS — R1013 Epigastric pain: Secondary | ICD-10-CM | POA: Diagnosis not present

## 2015-07-11 NOTE — Telephone Encounter (Signed)
Pt has appt to see Pamala Hurry Baity NP on 07/11/15 at [facility]3PM

## 2015-07-11 NOTE — Telephone Encounter (Signed)
Patient Name: Diana [name]  DOB: 01/04/1961    Initial Comment Caller states having abd pain, nausea, belching, diarrhea. Been having diarrhea all morning   Nurse Assessment  Nurse: Sherilyn CooterHenry, RN, Thurmond ButtsWade Date/Time Lamount Cohen(Eastern Time): 07/11/2015 2:03:05 PM  Confirm and document reason for call. If symptomatic, describe symptoms. You must click the next button to save text entered. ---Caller states that she has had diarrhea since this morning. She has had diarrhea x 3. She has abdominal pain which is constant but it varies in its intensity. She rates her pain as 5-6 on 0-10 scale. She denies vomiting. Denies fever.  Has the patient traveled out of the country within the last 30 days? ---No  Does the patient have any new or worsening symptoms? ---Yes  Will a triage be completed? ---Yes  Related visit to physician within the last 2 weeks? ---No  Does the PT have any chronic conditions? (i.e. diabetes, asthma, etc.) ---Yes  List chronic conditions. ---HTN  Is the patient pregnant or possibly pregnant? (Ask all females between the ages of 5712-55) ---No  Is this a behavioral health or substance abuse call? ---No     Guidelines    Guideline Title Affirmed Question Affirmed Notes  Diarrhea [1] Constant abdominal pain AND [2] present > 2 hours    Final Disposition User   See Physician within 4 Hours (or PCP triage) Sherilyn CooterHenry, RN, Thurmond ButtsWade    Comments  Appointment scheduled with Nicki Reaperegina Baity today at 3:00pm   Disagree/Comply: Comply

## 2015-07-11 NOTE — Progress Notes (Signed)
Subjective:    Patient ID: Diana [name], female    DOB: [birth year], 55 y.o.   MRN: [id number]  HPI  Pt presents to the clinic today with c/o abdominal pain and diarrhea. This started this morning. She woke up about 5 am with these symptoms. The abdominal pain is located in her epigastric region. She describes the pain as cramping. She has had nausea but no vomiting. She has has 3 loose stools today. She denies fever, chills or body aches. She is on Prilosec and Carafate for some ongoing stomach issues she has had. She took Zofran with minimal relief. She was scheduled for her GI appt today but she called them and they advised her to reschedule her appt with them and be seen here instead. Her appt has been rescheduled 4/3. She denies changes is diet or medications. She has not had contact with anyone who has had similar symptoms.  Review of Systems      Past Medical History  Diagnosis Date  . Hypertension   . Anxiety   . Ear infection     recurrent  . Allergy     Current Outpatient Prescriptions  Medication Sig Dispense Refill  . cetirizine (ZYRTEC) 10 MG tablet Take 10 mg by mouth daily.    . clonazePAM (KLONOPIN) 0.5 MG tablet Take 0.5 mg by mouth daily as needed for anxiety.     Marland Kitchen lisinopril-hydrochlorothiazide (PRINZIDE,ZESTORETIC) 10-12.5 MG tablet Take 1 tablet by mouth daily. 30 tablet 5  . omeprazole (PRILOSEC) 20 MG capsule TAKE 1 CAPSULE (20 MG TOTAL) BY MOUTH DAILY. 30 capsule 0  . ondansetron (ZOFRAN ODT) 4 MG disintegrating tablet Take 1 tablet (4 mg total) by mouth every 8 (eight) hours as needed for nausea. 10 tablet 0  . sucralfate (CARAFATE) 1 g tablet TAKE 1 TABLET BY MOUTH 4 TIMES DAILY. TAKE WITH MEALS AND AT BEDTIME. 120 tablet 0   No current facility-administered medications for this visit.    No Known Allergies  Family History  Problem Relation Age of Onset  . Hypertension Mother     alive  . Arthritis Mother   . Lung cancer Father     deceased  .  Heart disease Father   . Other      1 brother and 1 sister both alive an healthy  . Hypertension Maternal Grandmother     Social History   Social History  . Marital Status: Married    Spouse Name: N/A  . Number of Children: 3  . Years of Education: N/A   Occupational History  . customer service [employer]   Social History Main Topics  . Smoking status: Former Smoker    Types: Cigarettes    Quit date: 04/20/1985  . Smokeless tobacco: Not on file     Comment: remote tobacco use   . Alcohol Use: No  . Drug Use: No  . Sexual Activity: No   Other Topics Concern  . Not on file   Social History Narrative     Constitutional: Denies fever, malaise, fatigue, headache or abrupt weight changes.  Respiratory: Denies difficulty breathing, shortness of breath, cough or sputum production.   Cardiovascular: Denies chest pain, chest tightness, palpitations or swelling in the hands or feet.  Gastrointestinal: Pt reports epigastric pain, nausea and diarrhea. Denies  bloating, constipation, or blood in the stool.    No other specific complaints in a complete review of systems (except as listed in HPI above).  Objective:  Physical Exam    BP 118/80 mmHg  Pulse 98  Temp(Src) 97.8 F (36.6 C) (Oral)  Wt 226 lb 8 oz (102.74 kg)  SpO2 98% Wt Readings from Last 3 Encounters:  07/11/15 226 lb 8 oz (102.74 kg)  06/13/15 221 lb (100.245 kg)  05/28/15 218 lb (98.884 kg)    General: Appears her stated age, obese in NAD. Cardiovascular: Normal rate and rhythm. S1,S2 noted.  No murmur, rubs or gallops noted.  Pulmonary/Chest: Normal effort and positive vesicular breath sounds. No respiratory distress. No wheezes, rales or ronchi noted.  Abdomen: Soft and nontender. Normal bowel sounds. No distention or masses noted.   BMET    Component Value Date/Time   NA 136 05/26/2015 2059   K 3.8 05/26/2015 2059   CL 98* 05/26/2015 2059   CO2 26 05/26/2015 2059   GLUCOSE 142* 05/26/2015  2059   BUN 17 05/26/2015 2059   CREATININE 0.79 05/26/2015 2059   CALCIUM 9.4 05/26/2015 2059   GFRNONAA >60 05/26/2015 2059   GFRAA >60 05/26/2015 2059    Lipid Panel     Component Value Date/Time   CHOL 174 04/16/2015 0923   TRIG 76.0 04/16/2015 0923   HDL 18.20* 04/16/2015 0923   CHOLHDL 10 04/16/2015 0923   VLDL 15.2 04/16/2015 0923   LDLCALC 141* 04/16/2015 0923    CBC    Component Value Date/Time   WBC 8.4 05/26/2015 2059   RBC 5.06 05/26/2015 2059   HGB 16.0* 05/26/2015 2059   HCT 45.0 05/26/2015 2059   PLT 257 05/26/2015 2059   MCV 88.9 05/26/2015 2059   MCH 31.6 05/26/2015 2059   MCHC 35.6 05/26/2015 2059   RDW 13.1 05/26/2015 2059   LYMPHSABS 1.3 07/17/2010 1050   MONOABS 0.4 07/17/2010 1050   EOSABS 0.1 07/17/2010 1050   BASOSABS 0.0 07/17/2010 1050    Hgb A1C Lab Results  Component Value Date   HGBA1C 5.6 04/16/2015       Assessment & Plan:   Epigastric abdominal pain and diarrhea:  Symptoms have improved- exam normal today Continue Carafate Increase Prilosec to 2 tabs daily until you see GI Ok to continue Zofran Avoid antidiarrheals Bland diet Work note provided  RTC as needed or if symptoms persist or worsen

## 2015-07-11 NOTE — Patient Instructions (Signed)

## 2015-07-11 NOTE — Progress Notes (Signed)
Pre visit review using our clinic review tool, if applicable. No additional management support is needed unless otherwise documented below in the visit note. 

## 2015-07-22 DIAGNOSIS — R110 Nausea: Secondary | ICD-10-CM | POA: Diagnosis not present

## 2015-07-22 DIAGNOSIS — R1010 Upper abdominal pain, unspecified: Secondary | ICD-10-CM | POA: Diagnosis not present

## 2015-08-12 ENCOUNTER — Other Ambulatory Visit: Payer: Self-pay | Admitting: Internal Medicine

## 2015-08-12 NOTE — Telephone Encounter (Signed)
Last filled 05/30/15--please advise

## 2015-08-21 DIAGNOSIS — Z01818 Encounter for other preprocedural examination: Secondary | ICD-10-CM | POA: Diagnosis not present

## 2015-08-21 DIAGNOSIS — R110 Nausea: Secondary | ICD-10-CM | POA: Diagnosis not present

## 2015-08-21 DIAGNOSIS — Z1211 Encounter for screening for malignant neoplasm of colon: Secondary | ICD-10-CM | POA: Diagnosis not present

## 2015-09-06 DIAGNOSIS — L811 Chloasma: Secondary | ICD-10-CM | POA: Diagnosis not present

## 2015-09-06 DIAGNOSIS — L578 Other skin changes due to chronic exposure to nonionizing radiation: Secondary | ICD-10-CM | POA: Diagnosis not present

## 2015-09-06 DIAGNOSIS — D1801 Hemangioma of skin and subcutaneous tissue: Secondary | ICD-10-CM | POA: Diagnosis not present

## 2015-10-18 ENCOUNTER — Ambulatory Visit (INDEPENDENT_AMBULATORY_CARE_PROVIDER_SITE_OTHER): Payer: BLUE CROSS/BLUE SHIELD | Admitting: Primary Care

## 2015-10-18 ENCOUNTER — Encounter: Payer: Self-pay | Admitting: Primary Care

## 2015-10-18 VITALS — BP 118/68 | HR 77 | Temp 97.4°F | Wt 225.4 lb

## 2015-10-18 DIAGNOSIS — R1013 Epigastric pain: Secondary | ICD-10-CM

## 2015-10-18 NOTE — Patient Instructions (Signed)
Start taking omeprazole 40 mg daily for the next 2 weeks, then 20 mg day thereafter for at least another 2 weeks.  Continue Carafate four times daily as discussed.  Please notify myself or Rene KocherRegina if no improvement in these symptoms in 1 week, or if abdominal pain becomes worse, you develop vomiting, fevers.  It was a pleasure to see you today!  Food Choices for Gastroesophageal Reflux Disease, Adult When you have gastroesophageal reflux disease (GERD), the foods you eat and your eating habits are very important. Choosing the right foods can help ease the discomfort of GERD. WHAT GENERAL GUIDELINES DO I NEED TO FOLLOW?  Choose fruits, vegetables, whole grains, low-fat dairy products, and low-fat meat, fish, and poultry.  Limit fats such as oils, salad dressings, butter, nuts, and avocado.  Keep a food diary to identify foods that cause symptoms.  Avoid foods that cause reflux. These may be different for different people.  Eat frequent small meals instead of three large meals each day.  Eat your meals slowly, in a relaxed setting.  Limit fried foods.  Cook foods using methods other than frying.  Avoid drinking alcohol.  Avoid drinking large amounts of liquids with your meals.  Avoid bending over or lying down until 2-3 hours after eating. WHAT FOODS ARE NOT RECOMMENDED? The following are some foods and drinks that may worsen your symptoms: Vegetables Tomatoes. Tomato juice. Tomato and spaghetti sauce. Chili peppers. Onion and garlic. Horseradish. Fruits Oranges, grapefruit, and lemon (fruit and juice). Meats High-fat meats, fish, and poultry. This includes hot dogs, ribs, ham, sausage, salami, and bacon. Dairy Whole milk and chocolate milk. Sour cream. Cream. Butter. Ice cream. Cream cheese.  Beverages Coffee and tea, with or without caffeine. Carbonated beverages or energy drinks. Condiments Hot sauce. Barbecue sauce.  Sweets/Desserts Chocolate and cocoa. Donuts.  Peppermint and spearmint. Fats and Oils High-fat foods, including [place] fries and potato chips. Other Vinegar. Strong spices, such as black pepper, white pepper, red pepper, cayenne, curry powder, cloves, ginger, and chili powder. The items listed above may not be a complete list of foods and beverages to avoid. Contact your dietitian for more information.   This information is not intended to replace advice given to you by your health care provider. Make sure you discuss any questions you have with your health care provider.   Document Released: 04/06/2005 Document Revised: 04/27/2014 Document Reviewed: 02/08/2013 Elsevier Interactive Patient Education [employer]2016 Elsevier Inc.

## 2015-10-18 NOTE — Progress Notes (Signed)
Subjective:    Patient ID: Diana [name], female    DOB: 10/09/1960, 55 y.o.   MRN: [id number]  HPI  Ms. Cory RoughenKirkman is a 55 year old female with a history of GERD who presents today with a chief complaint of abdominal and back pain. She also reports nausea, and esophageal reflux. She has had prior work up for these complaints in the past with negative abdominal ultrasound, labs (H. Pylori, Amalyae, Lipase, CBC, CMP). She also underwent evaluation per GI and completed endoscopy which was mostly unremarkable except for a small hiatal hernia.   Her symptoms improved after evaluation per GI and then returned Saturday last weekend 1 hour after eating at [employer]. This is exactly how she felt in February 2017 during her work up. She is currently experiencing pain between her scapula, nausea, and generalized abdominal discomfort. She had not been taking her Prilosec recently, but did start again yesterday. She's also restarted her Carafate 3 days ago. Denies vomiting, diarrhea, fevers, bloody stools, chest pain, recent injury to back.  Review of Systems  Constitutional: Negative for fever and fatigue.  Respiratory: Negative for shortness of breath.   Cardiovascular: Negative for chest pain.  Gastrointestinal: Positive for nausea and abdominal pain. Negative for vomiting, diarrhea and constipation.       Past Medical History  Diagnosis Date  . Hypertension   . Anxiety   . Ear infection     recurrent  . Allergy      Social History   Social History  . Marital Status: Married    Spouse Name: N/A  . Number of Children: 3  . Years of Education: N/A   Occupational History  . customer service Food [employer]   Social History Main Topics  . Smoking status: Former Smoker    Types: Cigarettes    Quit date: 04/20/1985  . Smokeless tobacco: Not on file     Comment: remote tobacco use   . Alcohol Use: No  . Drug Use: No  . Sexual Activity: No   Other Topics Concern  . Not on file    Social History Narrative    Past Surgical History  Procedure Laterality Date  . Tubal ligation      bilateral  . Tonsillectomy      Family History  Problem Relation Age of Onset  . Hypertension Mother     alive  . Arthritis Mother   . Lung cancer Father     deceased  . Heart disease Father   . Other      1 brother and 1 sister both alive an healthy  . Hypertension Maternal Grandmother     No Known Allergies  Current Outpatient Prescriptions on File Prior to Visit  Medication Sig Dispense Refill  . cetirizine (ZYRTEC) 10 MG tablet Take 10 mg by mouth daily.    . clonazePAM (KLONOPIN) 0.5 MG tablet Take 0.5 mg by mouth daily as needed for anxiety.     Marland Kitchen. lisinopril-hydrochlorothiazide (PRINZIDE,ZESTORETIC) 10-12.5 MG tablet Take 1 tablet by mouth daily. 30 tablet 5  . omeprazole (PRILOSEC) 20 MG capsule TAKE 1 CAPSULE (20 MG TOTAL) BY MOUTH DAILY. 30 capsule 0  . ondansetron (ZOFRAN-ODT) 4 MG disintegrating tablet DISSOLVE 1 TABLET ON THE TONGUE EVERY 8 HOURS AS NEEDED FOR NAUSEA. 10 tablet 0  . sucralfate (CARAFATE) 1 g tablet TAKE 1 TABLET BY MOUTH 4 TIMES DAILY. TAKE WITH MEALS AND AT BEDTIME. 120 tablet 0   No current facility-administered medications on  file prior to visit.    BP 118/68 mmHg  Pulse 77  Temp(Src) 97.4 F (36.3 C) (Oral)  Wt 225 lb 6.4 oz (102.241 kg)  SpO2 98%  LMP 10/14/2015    Objective:   Physical Exam  Constitutional: She appears well-nourished. She does not appear ill.  Neck: Neck supple.  Cardiovascular: Normal rate and regular rhythm.   Pulmonary/Chest: Effort normal and breath sounds normal.  Abdominal: Soft. Normal appearance and bowel sounds are normal. There is generalized tenderness. There is no tenderness at McBurney's point and negative Murphy's sign.  Skin: Skin is warm and dry.          Assessment & Plan:  Abdominal pain:  Generalized, also with pain to posterior chest between scapula. Full workup for this in the  past per ED, PCP, and GI unremarkable. Suspect GERD/Ulcer as etiology based off HPI.  Exam with generalized tenderness, otherwise unremarkable, does not appear acutely ill, low likelyhood for ACS. Will have her increase omeprazole to 40 mg daily for 2 weeks, then decrease to 20 mg daily thereafter. Continue carafate. Discussed to notify PCP or myself if no improvement. Also strict ED precautions provided including chest pain, diaphoresis, nausea, etc. If symptoms persist then further work up with labs and CT may be necessary.

## 2015-10-18 NOTE — Progress Notes (Signed)
Pre visit review using our clinic review tool, if applicable. No additional management support is needed unless otherwise documented below in the visit note. 

## 2015-12-17 ENCOUNTER — Telehealth: Payer: Self-pay | Admitting: *Deleted

## 2015-12-17 NOTE — Telephone Encounter (Signed)
Ms. Cory RoughenKirkman came in to drop off a physical form. Please fill it out and let her know when it is completed. [phone]. Form placed in prescription tower.

## 2015-12-19 NOTE — Telephone Encounter (Signed)
Patient called about status of physical form she dropped off.  Patient said the form has to be faxed back by today.

## 2015-12-19 NOTE — Telephone Encounter (Signed)
Shawna OrleansMelanie, have you seen this form?

## 2015-12-19 NOTE — Telephone Encounter (Signed)
I have not even seen this form

## 2015-12-19 NOTE — Telephone Encounter (Signed)
Left in the front office for pt to pick up---faxed to number on from per pt request

## 2015-12-20 DIAGNOSIS — Z7689 Persons encountering health services in other specified circumstances: Secondary | ICD-10-CM

## 2015-12-24 ENCOUNTER — Other Ambulatory Visit: Payer: Self-pay | Admitting: Internal Medicine

## 2015-12-24 DIAGNOSIS — I10 Essential (primary) hypertension: Secondary | ICD-10-CM

## 2016-06-03 ENCOUNTER — Ambulatory Visit (INDEPENDENT_AMBULATORY_CARE_PROVIDER_SITE_OTHER): Payer: Self-pay | Admitting: Internal Medicine

## 2016-06-03 ENCOUNTER — Encounter: Payer: Self-pay | Admitting: Internal Medicine

## 2016-06-03 VITALS — BP 120/72 | HR 72 | Temp 97.5°F | Wt 226.0 lb

## 2016-06-03 DIAGNOSIS — R55 Syncope and collapse: Secondary | ICD-10-CM

## 2016-06-03 DIAGNOSIS — I952 Hypotension due to drugs: Secondary | ICD-10-CM

## 2016-06-03 DIAGNOSIS — K580 Irritable bowel syndrome with diarrhea: Secondary | ICD-10-CM

## 2016-06-03 NOTE — Progress Notes (Signed)
Subjective:    Patient ID: Diana [name], female    DOB: Mar 29, 1961, 56 y.o.   MRN: [id number]  HPI  Pt presents to the clinic today with c/o ongoing intermittent abdominal cramping and diarrhea. This is an ongoing issue, for which she has seen GI in the past. She has previously been diagnosed with IBS and thinks she just had a flare of this after eating greasy food. She is not taking her Prilosec or Carafate. Her symptoms have resolved at this time.  She also reports that she had a syncopal episode while in the bathroom last week. She stood up, and a few seconds later, woke up on the floor. She is not sure if she lost consciousness. She did not hit her head or injure herself in any way. She took her BP and it was 95/50. She has not taken her Lisinopril HCT since that time. Her BP today is 120/72. She has not had any further syncopal episodes.  Review of Systems  Past Medical History:  Diagnosis Date  . Allergy   . Anxiety   . Ear infection    recurrent  . Hypertension     Current Outpatient Prescriptions  Medication Sig Dispense Refill  . cetirizine (ZYRTEC) 10 MG tablet Take 10 mg by mouth daily.    . clonazePAM (KLONOPIN) 0.5 MG tablet Take 0.5 mg by mouth daily as needed for anxiety.     Marland Kitchen lisinopril-hydrochlorothiazide (PRINZIDE,ZESTORETIC) 10-12.5 MG tablet Take 1 tablet by mouth daily. MUST SCHEDULE ANNUAL PHYSICAL FOR January 2018 30 tablet 3  . omeprazole (PRILOSEC) 20 MG capsule TAKE 1 CAPSULE (20 MG TOTAL) BY MOUTH DAILY. (Patient not taking: Reported on 06/03/2016) 30 capsule 0  . ondansetron (ZOFRAN-ODT) 4 MG disintegrating tablet DISSOLVE 1 TABLET ON THE TONGUE EVERY 8 HOURS AS NEEDED FOR NAUSEA. (Patient not taking: Reported on 06/03/2016) 10 tablet 0  . sucralfate (CARAFATE) 1 g tablet TAKE 1 TABLET BY MOUTH 4 TIMES DAILY. TAKE WITH MEALS AND AT BEDTIME. (Patient not taking: Reported on 06/03/2016) 120 tablet 0   No current facility-administered medications for this  visit.     No Known Allergies  Family History  Problem Relation Age of Onset  . Hypertension Mother     alive  . Arthritis Mother   . Lung cancer Father     deceased  . Heart disease Father   . Other      1 brother and 1 sister both alive an healthy  . Hypertension Maternal Grandmother     Social History   Social History  . Marital status: Married    Spouse name: N/A  . Number of children: 3  . Years of education: N/A   Occupational History  . customer service [employer]   Social History Main Topics  . Smoking status: Former Smoker    Types: Cigarettes    Quit date: 04/20/1985  . Smokeless tobacco: Never Used     Comment: remote tobacco use   . Alcohol use No  . Drug use: No  . Sexual activity: No   Other Topics Concern  . Not on file   Social History Narrative  . No narrative on file     Constitutional: Denies fever, malaise, fatigue, headache or abrupt weight changes.  Cardiovascular: Denies chest pain, chest tightness, palpitations or swelling in the hands or feet.  Gastrointestinal: Pt reports intermittent abdominal pain and diarrhea. Denies bloating, constipation, or blood in the stool.  Neurological: Denies dizziness, difficulty  with memory, difficulty with speech or problems with balance and coordination.    No other specific complaints in a complete review of systems (except as listed in HPI above).     Objective:   Physical Exam   BP 120/72   Pulse 72   Temp 97.5 F (36.4 C) (Oral)   Wt 226 lb (102.5 kg)   SpO2 98%   BMI 35.40 kg/m  Wt Readings from Last 3 Encounters:  06/03/16 226 lb (102.5 kg)  10/18/15 225 lb 6.4 oz (102.2 kg)  07/11/15 226 lb 8 oz (102.7 kg)    General: Appears her stated age, obese in NAD. Cardiovascular: Normal rate and rhythm. S1,S2 noted.  No murmur, rubs or gallops noted.  Pulmonary/Chest: Normal effort and positive vesicular breath sounds. No respiratory distress. No wheezes, rales or ronchi noted.    Neurological: Alert and oriented.    BMET    Component Value Date/Time   NA 136 05/26/2015 2059   K 3.8 05/26/2015 2059   CL 98 (L) 05/26/2015 2059   CO2 26 05/26/2015 2059   GLUCOSE 142 (H) 05/26/2015 2059   BUN 17 05/26/2015 2059   CREATININE 0.79 05/26/2015 2059   CALCIUM 9.4 05/26/2015 2059   GFRNONAA >60 05/26/2015 2059   GFRAA >60 05/26/2015 2059    Lipid Panel     Component Value Date/Time   CHOL 174 04/16/2015 0923   TRIG 76.0 04/16/2015 0923   HDL 18.20 (L) 04/16/2015 0923   CHOLHDL 10 04/16/2015 0923   VLDL 15.2 04/16/2015 0923   LDLCALC 141 (H) 04/16/2015 0923    CBC    Component Value Date/Time   WBC 8.4 05/26/2015 2059   RBC 5.06 05/26/2015 2059   HGB 16.0 (H) 05/26/2015 2059   HCT 45.0 05/26/2015 2059   PLT 257 05/26/2015 2059   MCV 88.9 05/26/2015 2059   MCH 31.6 05/26/2015 2059   MCHC 35.6 05/26/2015 2059   RDW 13.1 05/26/2015 2059   LYMPHSABS 1.3 07/17/2010 1050   MONOABS 0.4 07/17/2010 1050   EOSABS 0.1 07/17/2010 1050   BASOSABS 0.0 07/17/2010 1050    Hgb A1C Lab Results  Component Value Date   HGBA1C 5.6 04/16/2015           Assessment & Plan:   Syncopal episode secondary to hypotension:  Hold Lisinopril HCT for now  IBS:  Advised her to start her Prilosec again Avoid fried and greasy foods as this seems to be a trigger for her Will monitor  RTC in 2 weeks to recheck BP

## 2016-06-03 NOTE — Patient Instructions (Signed)
Hypotension As your heart beats, it forces blood through your body. This force is called blood pressure. If you have hypotension, you have low blood pressure. When your blood pressure is too low, you may not get enough blood to your brain. You may feel weak, feel light-headed, have a fast heartbeat, or even pass out (faint). Follow these instructions at home: Eating and drinking  Drink enough fluids to keep your pee (urine) clear or pale yellow.  Eat a healthy diet, and follow instructions from your doctor about eating or drinking restrictions. A healthy diet includes: ? Fresh fruits and vegetables. ? Whole grains. ? Low-fat (lean) meats. ? Low-fat dairy products.  Eat extra salt only as told. Do not add extra salt to your diet unless your doctor tells you to.  Eat small meals often.  Avoid standing up quickly after you eat. Medicines  Take over-the-counter and prescription medicines only as told by your doctor. ? Follow instructions from your doctor about changing how much you take (the dosage) of your medicines, if this applies. ? Do not stop or change your medicine on your own. General instructions  Wear compression stockings as told by your doctor.  Get up slowly from lying down or sitting.  Avoid hot showers and a lot of heat as told by your doctor.  Return to your normal activities as told by your doctor. Ask what activities are safe for you.  Do not use any products that contain nicotine or tobacco, such as cigarettes and e-cigarettes. If you need help quitting, ask your doctor.  Keep all follow-up visits as told by your doctor. This is important. Contact a doctor if:  You throw up (vomit).  You have watery poop (diarrhea).  You have a fever for more than 2-3 days.  You feel more thirsty than normal.  You feel weak and tired. Get help right away if:  You have chest pain.  You have a fast or irregular heartbeat.  You lose feeling (get numbness) in any part  of your body.  You cannot move your arms or your legs.  You have trouble talking.  You get sweaty or feel light-headed.  You faint.  You have trouble breathing.  You have trouble staying awake.  You feel confused. This information is not intended to replace advice given to you by your health care provider. Make sure you discuss any questions you have with your health care provider. Document Released: 07/01/2009 Document Revised: 12/24/2015 Document Reviewed: 12/24/2015 Elsevier Interactive Patient Education  2017 Elsevier Inc.   

## 2016-06-09 ENCOUNTER — Encounter: Payer: Self-pay | Admitting: Internal Medicine

## 2016-06-09 ENCOUNTER — Ambulatory Visit (INDEPENDENT_AMBULATORY_CARE_PROVIDER_SITE_OTHER): Payer: BLUE CROSS/BLUE SHIELD | Admitting: Internal Medicine

## 2016-06-09 VITALS — BP 126/72 | HR 74 | Temp 97.9°F | Ht 67.0 in | Wt 235.0 lb

## 2016-06-09 DIAGNOSIS — Z1211 Encounter for screening for malignant neoplasm of colon: Secondary | ICD-10-CM

## 2016-06-09 DIAGNOSIS — J301 Allergic rhinitis due to pollen: Secondary | ICD-10-CM

## 2016-06-09 DIAGNOSIS — I10 Essential (primary) hypertension: Secondary | ICD-10-CM

## 2016-06-09 DIAGNOSIS — F411 Generalized anxiety disorder: Secondary | ICD-10-CM

## 2016-06-09 DIAGNOSIS — K219 Gastro-esophageal reflux disease without esophagitis: Secondary | ICD-10-CM | POA: Insufficient documentation

## 2016-06-09 DIAGNOSIS — Z0001 Encounter for general adult medical examination with abnormal findings: Secondary | ICD-10-CM

## 2016-06-09 LAB — COMPREHENSIVE METABOLIC PANEL
ALBUMIN: 3.9 g/dL (ref 3.5–5.2)
ALT: 12 U/L (ref 0–35)
AST: 12 U/L (ref 0–37)
Alkaline Phosphatase: 54 U/L (ref 39–117)
BILIRUBIN TOTAL: 1.1 mg/dL (ref 0.2–1.2)
BUN: 7 mg/dL (ref 6–23)
CALCIUM: 8.8 mg/dL (ref 8.4–10.5)
CHLORIDE: 105 meq/L (ref 96–112)
CO2: 31 mEq/L (ref 19–32)
Creatinine, Ser: 0.59 mg/dL (ref 0.40–1.20)
GFR: 113.09 mL/min (ref >60.00)
GLUCOSE: 93 mg/dL (ref 70–99)
POTASSIUM: 3.4 meq/L — AB (ref 3.5–5.1)
Sodium: 140 mEq/L (ref 135–145)
TOTAL PROTEIN: 6.3 g/dL (ref 6.0–8.3)

## 2016-06-09 LAB — LIPID PANEL
CHOL/HDL RATIO: 7
Cholesterol: 136 mg/dL (ref 0–200)
HDL: 18.2 mg/dL — AB (ref >39.00)
LDL CALC: 102 mg/dL — AB (ref 0–99)
NonHDL: 117.43
TRIGLYCERIDES: 77 mg/dL (ref 0.0–149.0)
VLDL: 15.4 mg/dL (ref 0.0–40.0)

## 2016-06-09 LAB — CBC
HEMATOCRIT: 33.1 % — AB (ref 36.0–46.0)
Hemoglobin: 10.9 g/dL — ABNORMAL LOW (ref 12.0–15.0)
MCHC: 33.1 g/dL (ref 30.0–36.0)
MCV: 83 fl (ref 78.0–100.0)
PLATELETS: 218 10*3/uL (ref 150.0–400.0)
RBC: 3.99 Mil/uL (ref 3.87–5.11)
RDW: 18 % — ABNORMAL HIGH (ref 11.5–15.5)
WBC: 5.2 10*3/uL (ref 4.0–10.5)

## 2016-06-09 LAB — TSH: TSH: 1.63 u[IU]/mL (ref 0.35–4.50)

## 2016-06-09 LAB — HEMOGLOBIN A1C: Hgb A1c MFr Bld: 5.9 % (ref 4.6–6.5)

## 2016-06-09 MED ORDER — HYDROCHLOROTHIAZIDE 12.5 MG PO CAPS: 12.5000 mg | ORAL_CAPSULE | Freq: Every day | ORAL | 2 refills | Status: DC

## 2016-06-09 NOTE — Progress Notes (Signed)
Subjective:    Patient ID: Diana [name], female    DOB: [birth year], 56 y.o.   MRN: [id number]  HPI  Pt presents to the clinic today for her annual exam. She is also due to follow up chronic conditions:  HTN: Her BP today is 126/72. She was recently taken off her Lisinopril HCT due to hypotension. Since that time, she has noticed swelling in her legs. She has also gained 10 lbs in the last week.    GAD: Triggered by marital issues and everyday life. She is taking Clonazepam about 1-2 times a month.  Seasonal Allergies: Worse in the spring. She is taking Zyrtec daily with good relief.  GERD: She has had issues with this lately. She recently restarted her Prilosec and feels like it is helping. She is not taking the Carafate.  Flu: never Tetanus: 03/2015 Pap Smear: 2016 Mammogram: 2013 Colon Screening: 2002 Vision Screening: 02/2016 Dentist: as needed  Diet: She does eat meat. She does not eat many fruits and veggies. She eats fried foods. She drinks mostly water. Exercise: None   Review of Systems      Past Medical History:  Diagnosis Date  . Allergy   . Anxiety   . Ear infection    recurrent  . Hypertension     Current Outpatient Prescriptions  Medication Sig Dispense Refill  . cetirizine (ZYRTEC) 10 MG tablet Take 10 mg by mouth daily.    . clonazePAM (KLONOPIN) 0.5 MG tablet Take 0.5 mg by mouth daily as needed for anxiety.     Marland Kitchen omeprazole (PRILOSEC) 20 MG capsule TAKE 1 CAPSULE (20 MG TOTAL) BY MOUTH DAILY. (Patient not taking: Reported on 06/03/2016) 30 capsule 0  . ondansetron (ZOFRAN-ODT) 4 MG disintegrating tablet DISSOLVE 1 TABLET ON THE TONGUE EVERY 8 HOURS AS NEEDED FOR NAUSEA. (Patient not taking: Reported on 06/03/2016) 10 tablet 0  . sucralfate (CARAFATE) 1 g tablet TAKE 1 TABLET BY MOUTH 4 TIMES DAILY. TAKE WITH MEALS AND AT BEDTIME. (Patient not taking: Reported on 06/03/2016) 120 tablet 0   No current facility-administered medications for this visit.       No Known Allergies  Family History  Problem Relation Age of Onset  . Hypertension Mother     alive  . Arthritis Mother   . Lung cancer Father     deceased  . Heart disease Father   . Other      1 brother and 1 sister both alive an healthy  . Hypertension Maternal Grandmother     Social History   Social History  . Marital status: Married    Spouse name: N/A  . Number of children: 3  . Years of education: N/A   Occupational History  . customer service [employer]   Social History Main Topics  . Smoking status: Former Smoker    Types: Cigarettes    Quit date: 04/20/1985  . Smokeless tobacco: Never Used     Comment: remote tobacco use   . Alcohol use No  . Drug use: No  . Sexual activity: No   Other Topics Concern  . Not on file   Social History Narrative  . No narrative on file     Constitutional: Pt reports weight gain. Denies fever, malaise, fatigue, headache.  HEENT: Denies eye pain, eye redness, ear pain, ringing in the ears, wax buildup, runny nose, nasal congestion, bloody nose, or sore throat. Respiratory: Pt reports intermittent shortness of breath. Denies difficulty breathing, cough or  sputum production.   Cardiovascular: Pt reports swelling in her legs. Denies chest pain, chest tightness, palpitations or swelling in the hands.  Gastrointestinal: Pt reports intermittent reflux and diarrhea. Denies abdominal pain, bloating, constipation, or blood in the stool.  GU: Denies urgency, frequency, pain with urination, burning sensation, blood in urine, odor or discharge. Musculoskeletal: Denies decrease in range of motion, difficulty with gait, muscle pain or joint pain and swelling.  Skin: Denies redness, rashes, lesions or ulcercations.  Neurological: Denies dizziness, difficulty with memory, difficulty with speech or problems with balance and coordination.  Psych: Pt reports anxiety. Denies depression, SI/HI.  No other specific complaints in a complete  review of systems (except as listed in HPI above).  Objective:   Physical Exam   BP 126/72   Pulse 74   Temp 97.9 F (36.6 C) (Oral)   Ht 5\' 7"  (1.702 m)   Wt 235 lb (106.6 kg)   LMP 01/20/2016   SpO2 99%   BMI 36.81 kg/m  Wt Readings from Last 3 Encounters:  06/09/16 235 lb (106.6 kg)  06/03/16 226 lb (102.5 kg)  10/18/15 225 lb 6.4 oz (102.2 kg)    General: Appears her stated age, obese in NAD. Skin: Warm, dry and intact.  HEENT: Head: normal shape and size; Eyes: sclera white, no icterus, conjunctiva pink, PERRLA and EOMs intact; Ears: Tm's gray and intact, normal light reflex; Throat/Mouth: Teeth present, mucosa pink and moist, no exudate, lesions or ulcerations noted.  Neck:  Neck supple, trachea midline. No masses, lumps or thyromegaly present.  Cardiovascular: Normal rate and rhythm. S1,S2 noted.  No murmur, rubs or gallops noted. 1+ non pitting BLE edema. No carotid bruits noted. Pulmonary/Chest: Normal effort and positive vesicular breath sounds with fine crackles in bilateral bases. No respiratory distress. No wheezes, or ronchi noted.  Abdomen: Soft and nontender. Normal bowel sounds. No distention or masses noted. Liver, spleen and kidneys non palpable. Musculoskeletal: Strength 5/5 BUE/BLE. No difficulty with gait.  Neurological: Alert and oriented. Cranial nerves II-XII grossly intact. Coordination normal.  Psychiatric: Mood and affect normal. Behavior is normal. Judgment and thought content normal.    BMET    Component Value Date/Time   NA 136 05/26/2015 2059   K 3.8 05/26/2015 2059   CL 98 (L) 05/26/2015 2059   CO2 26 05/26/2015 2059   GLUCOSE 142 (H) 05/26/2015 2059   BUN 17 05/26/2015 2059   CREATININE 0.79 05/26/2015 2059   CALCIUM 9.4 05/26/2015 2059   GFRNONAA >60 05/26/2015 2059   GFRAA >60 05/26/2015 2059    Lipid Panel     Component Value Date/Time   CHOL 174 04/16/2015 0923   TRIG 76.0 04/16/2015 0923   HDL 18.20 (L) 04/16/2015 0923    CHOLHDL 10 04/16/2015 0923   VLDL 15.2 04/16/2015 0923   LDLCALC 141 (H) 04/16/2015 0923    CBC    Component Value Date/Time   WBC 8.4 05/26/2015 2059   RBC 5.06 05/26/2015 2059   HGB 16.0 (H) 05/26/2015 2059   HCT 45.0 05/26/2015 2059   PLT 257 05/26/2015 2059   MCV 88.9 05/26/2015 2059   MCH 31.6 05/26/2015 2059   MCHC 35.6 05/26/2015 2059   RDW 13.1 05/26/2015 2059   LYMPHSABS 1.3 07/17/2010 1050   MONOABS 0.4 07/17/2010 1050   EOSABS 0.1 07/17/2010 1050   BASOSABS 0.0 07/17/2010 1050    Hgb A1C Lab Results  Component Value Date   HGBA1C 5.6 04/16/2015  Assessment & Plan:   Preventative Health Maintenance:  She declines flu shot Tetanus UTD Pap smear UTD Mammogram ordered, she will call [facility] to schedule this (number provided) She declines colonoscopy but is agreeable to Cologuard- ordered Encouraged her to consume a balanced diet and exercise regimen Advised her to see an eye doctor and dentist annually  RTC in 1 year, sooner if needed [name], NP

## 2016-06-09 NOTE — Assessment & Plan Note (Signed)
Chronic but stable on rare use of Clonazepam Will monitor for now

## 2016-06-09 NOTE — Assessment & Plan Note (Signed)
Advised her to continue the Prilosec for now Will d/c the Carafate Encouraged weight loss

## 2016-06-09 NOTE — Patient Instructions (Signed)

## 2016-06-09 NOTE — Assessment & Plan Note (Addendum)
BP up, increased swelling and mild SOB Will restart HCTZ daily, eRx sent to pharmacy [facility] today

## 2016-06-09 NOTE — Assessment & Plan Note (Signed)
Continue Zyrtec.

## 2016-06-10 ENCOUNTER — Telehealth: Payer: Self-pay | Admitting: Internal Medicine

## 2016-06-10 ENCOUNTER — Other Ambulatory Visit: Payer: Self-pay | Admitting: Internal Medicine

## 2016-06-10 MED ORDER — POTASSIUM CHLORIDE ER 10 MEQ PO TBCR: 10.0000 meq | EXTENDED_RELEASE_TABLET | Freq: Every day | ORAL | 2 refills | Status: DC

## 2016-06-10 NOTE — Telephone Encounter (Signed)
Please call pt about labs. The drugstore has called pt about rx ready to pick up and the pt would like to know why  cb number is [phone] Thanks

## 2016-06-17 ENCOUNTER — Other Ambulatory Visit: Payer: Self-pay

## 2016-06-17 MED ORDER — CLONAZEPAM 0.5 MG PO TABS: 0.5000 mg | ORAL_TABLET | Freq: Every day | ORAL | 0 refills | Status: DC | PRN

## 2016-06-17 NOTE — Telephone Encounter (Signed)
Pt left v/m requesting refill Klonopin to [facility]. On med list but do not see last refill date. Last annual exam on 06/09/16. Pt request cb when refilled.

## 2016-06-17 NOTE — Telephone Encounter (Signed)
Ok to phone in Clonazepam.  *please change class to "phone in" 

## 2016-06-24 ENCOUNTER — Telehealth: Payer: Self-pay

## 2016-06-24 NOTE — Telephone Encounter (Signed)
Cologuard form has been faxed w/ demographics and insurance card attached 

## 2016-07-28 ENCOUNTER — Ambulatory Visit (INDEPENDENT_AMBULATORY_CARE_PROVIDER_SITE_OTHER): Payer: Self-pay | Admitting: Family Medicine

## 2016-07-28 ENCOUNTER — Other Ambulatory Visit: Payer: Self-pay | Admitting: Family Medicine

## 2016-07-28 ENCOUNTER — Encounter: Payer: Self-pay | Admitting: Family Medicine

## 2016-07-28 VITALS — BP 126/80 | HR 74 | Wt 215.2 lb

## 2016-07-28 DIAGNOSIS — K589 Irritable bowel syndrome without diarrhea: Secondary | ICD-10-CM

## 2016-07-28 DIAGNOSIS — Z1211 Encounter for screening for malignant neoplasm of colon: Secondary | ICD-10-CM

## 2016-07-28 DIAGNOSIS — K219 Gastro-esophageal reflux disease without esophagitis: Secondary | ICD-10-CM

## 2016-07-28 DIAGNOSIS — R630 Anorexia: Secondary | ICD-10-CM

## 2016-07-28 DIAGNOSIS — R198 Other specified symptoms and signs involving the digestive system and abdomen: Secondary | ICD-10-CM

## 2016-07-28 DIAGNOSIS — E876 Hypokalemia: Secondary | ICD-10-CM

## 2016-07-28 LAB — COMPREHENSIVE METABOLIC PANEL
ALT: 11 U/L (ref 0–35)
AST: 14 U/L (ref 0–37)
Albumin: 4.2 g/dL (ref 3.5–5.2)
Alkaline Phosphatase: 57 U/L (ref 39–117)
BUN: 9 mg/dL (ref 6–23)
CALCIUM: 9.1 mg/dL (ref 8.4–10.5)
CO2: 37 meq/L — AB (ref 19–32)
CREATININE: 0.71 mg/dL (ref 0.40–1.20)
Chloride: 94 mEq/L — ABNORMAL LOW (ref 96–112)
GFR: 91.29 mL/min (ref >60.00)
GLUCOSE: 123 mg/dL — AB (ref 70–99)
Potassium: 2.4 mEq/L — CL (ref 3.5–5.1)
SODIUM: 138 meq/L (ref 135–145)
Total Bilirubin: 1.3 mg/dL — ABNORMAL HIGH (ref 0.2–1.2)
Total Protein: 6.9 g/dL (ref 6.0–8.3)

## 2016-07-28 LAB — H. PYLORI ANTIBODY, IGG: H Pylori IgG: NEGATIVE

## 2016-07-28 LAB — CBC WITH DIFFERENTIAL/PLATELET
BASOS ABS: 0 10*3/uL (ref 0.0–0.1)
Basophils Relative: 0.8 % (ref 0.0–3.0)
EOS ABS: 0 10*3/uL (ref 0.0–0.7)
Eosinophils Relative: 1.1 % (ref 0.0–5.0)
HCT: 39.6 % (ref 36.0–46.0)
Hemoglobin: 13 g/dL (ref 12.0–15.0)
LYMPHS ABS: 1.1 10*3/uL (ref 0.7–4.0)
Lymphocytes Relative: 24.2 % (ref 12.0–46.0)
MCHC: 32.8 g/dL (ref 30.0–36.0)
MCV: 82.2 fl (ref 78.0–100.0)
MONO ABS: 0.4 10*3/uL (ref 0.1–1.0)
Monocytes Relative: 9.6 % (ref 3.0–12.0)
NEUTROS ABS: 3 10*3/uL (ref 1.4–7.7)
NEUTROS PCT: 64.3 % (ref 43.0–77.0)
PLATELETS: 253 10*3/uL (ref 150.0–400.0)
RBC: 4.82 Mil/uL (ref 3.87–5.11)
RDW: 15.7 % — ABNORMAL HIGH (ref 11.5–15.5)
WBC: 4.7 10*3/uL (ref 4.0–10.5)

## 2016-07-28 LAB — LIPASE: LIPASE: 25 U/L (ref 11.0–59.0)

## 2016-07-28 MED ORDER — OMEPRAZOLE 20 MG PO CPDR: DELAYED_RELEASE_CAPSULE | ORAL | 6 refills | Status: DC

## 2016-07-28 NOTE — Assessment & Plan Note (Signed)
With intermittent diarrhea and constipation. Overdue for colonoscopy- will refer to GI. Refilled prilosec.  Does seems she has a component of IBS and I question if maybe this is also remnants of a viral syndrome as well. Check labs today. The patient indicates understanding of these issues and agrees with the plan.  Orders Placed This Encounter  Procedures  . CBC with Differential/Platelet  . Lipase  . Comprehensive metabolic panel  . H. pylori antibody, IgG  . Ambulatory referral to Gastroenterology

## 2016-07-28 NOTE — Patient Instructions (Signed)
Great to see you.  I will call you with your lab results.  You can stop by to see Shirlee Limerick on your way out.

## 2016-07-28 NOTE — Progress Notes (Signed)
Subjective:   Patient ID: Diana [name], female    DOB: [birth year], 56 y.o.   MRN: [id number]  Diana [name] is a pleasant 56 y.o. year old female pt of [name], new to me,  who presents to clinic today with Acute Visit (X 2 weeks h/o abdominal problems, unintenional weight loss 13lbs in 13 days)  on 07/28/2016  HPI:  Does have history of GERD- on prilosec, has been on carafate in the past.  Also has history of IBS with constipation.  Two weeks ago, she had one of her "flares" which consists of feeling gasy, bloated and constipation.  Once she has a BM, she typically feels better but has felt bad ever since.  She has had diarrhea the past 3 morning.  No blood or mucous in her stools.  Decreased appetite- has lost 13 pounds in 13 days according to her scales.  Not having any persistent abdominal pain.  No fevers.  No black stools.  Ran out of her prilosec. Current Outpatient Prescriptions on File Prior to Visit  Medication Sig Dispense Refill  . cetirizine (ZYRTEC) 10 MG tablet Take 10 mg by mouth daily.    . clonazePAM (KLONOPIN) 0.5 MG tablet Take 1 tablet (0.5 mg total) by mouth daily as needed for anxiety. 30 tablet 0  . hydrochlorothiazide (MICROZIDE) 12.5 MG capsule Take 1 capsule (12.5 mg total) by mouth daily. 30 capsule 2  . omeprazole (PRILOSEC) 20 MG capsule TAKE 1 CAPSULE (20 MG TOTAL) BY MOUTH DAILY. 30 capsule 0  . ondansetron (ZOFRAN-ODT) 4 MG disintegrating tablet DISSOLVE 1 TABLET ON THE TONGUE EVERY 8 HOURS AS NEEDED FOR NAUSEA. 10 tablet 0  . potassium chloride (K-DUR) 10 MEQ tablet Take 1 tablet (10 mEq total) by mouth daily. 30 tablet 2   No current facility-administered medications on file prior to visit.     No Known Allergies  Past Medical History:  Diagnosis Date  . Allergy   . Anxiety   . Ear infection    recurrent  . Hypertension     Past Surgical History:  Procedure Laterality Date  . TONSILLECTOMY    . TUBAL LIGATION     bilateral    Family History  Problem Relation Age of Onset  . Hypertension Mother     alive  . Arthritis Mother   . Lung cancer Father     deceased  . Heart disease Father   . Other      1 brother and 1 sister both alive an healthy  . Hypertension Maternal Grandmother     Social History   Social History  . Marital status: Married    Spouse name: N/A  . Number of children: 3  . Years of education: N/A   Occupational History  . customer service [employer]   Social History Main Topics  . Smoking status: Former Smoker    Types: Cigarettes    Quit date: 04/20/1985  . Smokeless tobacco: Never Used     Comment: remote tobacco use   . Alcohol use No  . Drug use: No  . Sexual activity: No   Other Topics Concern  . Not on file   Social History Narrative  . No narrative on file   The PMH, PSH, Social History, Family History, Medications, and allergies have been reviewed in [facility], and have been updated if relevant.   Review of Systems  Constitutional: Positive for appetite change and unexpected weight change. Negative for fever.  Gastrointestinal: Positive for abdominal distention, abdominal pain, constipation, diarrhea and nausea. Negative for anal bleeding, blood in stool, rectal pain and vomiting.  Neurological: Negative.   All other systems reviewed and are negative.      Objective:    BP 126/80   Pulse 74   Wt 215 lb 4 oz (97.6 kg)   SpO2 98%   BMI 33.71 kg/m   Wt Readings from Last 3 Encounters:  07/28/16 215 lb 4 oz (97.6 kg)  06/09/16 235 lb (106.6 kg)  06/03/16 226 lb (102.5 kg)     Physical Exam  Constitutional: She is oriented to person, place, and time. She appears well-developed and well-nourished. No distress.  HENT:  Head: Normocephalic and atraumatic.  Eyes: Conjunctivae are normal.  Cardiovascular: Normal rate.   Pulmonary/Chest: Effort normal.  Abdominal: Soft. Normal appearance and bowel sounds are normal. There is tenderness in the  epigastric area. There is no rebound and no guarding.  Neurological: She is alert and oriented to person, place, and time. No cranial nerve deficit.  Skin: Skin is warm and dry. She is not diaphoretic.  Psychiatric: She has a normal mood and affect. Her behavior is normal. Judgment and thought content normal.  Nursing note and vitals reviewed.         Assessment & Plan:   No diagnosis found. No Follow-up on file.

## 2016-07-29 ENCOUNTER — Telehealth: Payer: Self-pay

## 2016-07-29 NOTE — Telephone Encounter (Signed)
PLEASE NOTE: All timestamps contained within this report are represented as [place] Standard Time. CONFIDENTIALTY NOTICE: This fax transmission is intended only for the addressee. It contains information that is legally privileged, confidential or otherwise protected from use or disclosure. If you are not the intended recipient, you are strictly prohibited from reviewing, disclosing, copying using or disseminating any of this information or taking any action in reliance on or regarding this information. If you have received this fax in error, please notify us immediately by telephone so that we can arrange for its return to [place]. Phone: [phone], Toll-Free: [phone], Fax: [phone] Page: 1 of 1 Call Id: [id number] [facility] Night - Client TELEPHONE ADVICE RECORD [facility] Patient Name: JAVANA SCHEY Gender: Female DOB: May 03, 1960 Age: 56 Y 8 M 3 D Return Phone Number: [phone] (Primary) City/State/Zip: [place] Client [facility] Night - Client Client Site [facility] [place] - Night Physician Ruthe Mannan - MD Who Is Calling Lab Lab Name [facility] Lab Phone Number [phone] Lab Tech Name [facility] Reference Number Chief Complaint Lab Result (Critical or Stat) Call Type Lab Send to RN Reason for Call Report lab results Initial Comment Caller states they have critical labs Additional Comment Nurse Assessment Nurse: Evonnie Dawes, RN, Cala Bradford Date/Time (Eastern Time): 07/28/2016 5:41:57 PM Is there an on-call provider listed? ---Yes Please list name of person reporting value (Lab Employee) and a contact number. ---[facility] [phone] Please document the following items: Lab name Lab value (read back to lab to verify) Reference range for lab value Date and time blood was drawn Collect time of birth for bilirubin results ---Potassium 2.4 (3.5-5.1) 07/28/2016@1232  Please collect the  patient contact information from the lab. (name, phone number and address) ---[phone] List any special notes provided by lab. ---not hemolyzed Guidelines Guideline Title Affirmed Question Disp. Time Lamount Cohen Time) Disposition Final User 07/28/2016 5:43:40 PM Clinical Call Yes Evonnie Dawes, RN, [facility] DoctorName Phone DateTime Action Result/Outcome Notes Derryl Harbor - MD [phone] 07/28/2016 5:40:23 PM Doctor Paged Called On Call Provider - Rob Hickman - MD 07/28/2016 5:41:38 PM Message Result Spoke with On Call - General notified MD of Potassium of 2.4, phone number of patient given to MD

## 2016-07-31 ENCOUNTER — Other Ambulatory Visit (INDEPENDENT_AMBULATORY_CARE_PROVIDER_SITE_OTHER): Payer: BLUE CROSS/BLUE SHIELD

## 2016-07-31 DIAGNOSIS — E876 Hypokalemia: Secondary | ICD-10-CM

## 2016-07-31 LAB — BASIC METABOLIC PANEL
BUN: 9 mg/dL (ref 6–23)
CALCIUM: 9.2 mg/dL (ref 8.4–10.5)
CHLORIDE: 98 meq/L (ref 96–112)
CO2: 35 meq/L — AB (ref 19–32)
CREATININE: 0.71 mg/dL (ref 0.40–1.20)
GFR: 91.29 mL/min (ref >60.00)
Glucose, Bld: 129 mg/dL — ABNORMAL HIGH (ref 70–99)
Potassium: 3 mEq/L — ABNORMAL LOW (ref 3.5–5.1)
Sodium: 140 mEq/L (ref 135–145)

## 2016-08-06 ENCOUNTER — Telehealth: Payer: Self-pay | Admitting: Family Medicine

## 2016-08-06 DIAGNOSIS — E876 Hypokalemia: Secondary | ICD-10-CM

## 2016-08-06 NOTE — Telephone Encounter (Signed)
Will defer to PCP as this would be chronic management.  She can likely return to her previous dose as she admittedly had not been taking it as directed.

## 2016-08-06 NOTE — Telephone Encounter (Signed)
Patient said she was notified of her lab results on Monday and patient was asked how much potassium she was taken and then she would receive a call back.  Patient said she hasn't heard from anyone since Monday. Please call patient.

## 2016-08-06 NOTE — Telephone Encounter (Signed)
Dr Dayton Martes, does she need to change or continue 2 tablets twice a day

## 2016-08-06 NOTE — Telephone Encounter (Signed)
Continue 10 meq BID, repeat potassium lab only in 2 weeks

## 2016-08-06 NOTE — Telephone Encounter (Signed)
Per Lab Results: Notes recorded by Eual Fines, CMA on 08/03/2016 at 4:11 PM EDT Spoke to pt. She said she is taking 2 tablets twice a day. ------  Notes recorded by Dianne Dun, MD on 08/01/2016 at 6:53 AM EDT Please let pt know that her potassium is better. How much is she currently taking?

## 2016-08-07 NOTE — Telephone Encounter (Signed)
Pt is aware as instructed and lab only appt scheduled

## 2016-08-07 NOTE — Addendum Note (Signed)
Addended by: Roena Malady on: 08/07/2016 11:05 AM   Modules accepted: Orders

## 2016-08-21 ENCOUNTER — Other Ambulatory Visit (INDEPENDENT_AMBULATORY_CARE_PROVIDER_SITE_OTHER): Payer: BLUE CROSS/BLUE SHIELD

## 2016-08-21 DIAGNOSIS — E876 Hypokalemia: Secondary | ICD-10-CM | POA: Diagnosis not present

## 2016-08-21 LAB — POTASSIUM: Potassium: 3.3 mEq/L — ABNORMAL LOW (ref 3.5–5.1)

## 2016-08-21 MED ORDER — POTASSIUM CHLORIDE CRYS ER 20 MEQ PO TBCR: 20.0000 meq | EXTENDED_RELEASE_TABLET | Freq: Two times a day (BID) | ORAL | 2 refills | Status: DC

## 2016-08-21 NOTE — Addendum Note (Signed)
Addended by: Roena MaladyEVONTENNO, MELANIE Y on: 08/21/2016 04:37 PM   Modules accepted: Orders

## 2016-09-10 ENCOUNTER — Other Ambulatory Visit: Payer: Self-pay | Admitting: Internal Medicine

## 2016-09-16 ENCOUNTER — Encounter: Payer: Self-pay | Admitting: Internal Medicine

## 2016-09-17 ENCOUNTER — Encounter: Payer: Self-pay | Admitting: Internal Medicine

## 2016-09-17 ENCOUNTER — Ambulatory Visit (INDEPENDENT_AMBULATORY_CARE_PROVIDER_SITE_OTHER): Payer: BLUE CROSS/BLUE SHIELD | Admitting: Internal Medicine

## 2016-09-17 VITALS — BP 120/78 | HR 72 | Temp 98.0°F | Wt 203.0 lb

## 2016-09-17 DIAGNOSIS — E876 Hypokalemia: Secondary | ICD-10-CM | POA: Diagnosis not present

## 2016-09-17 DIAGNOSIS — R233 Spontaneous ecchymoses: Secondary | ICD-10-CM

## 2016-09-17 DIAGNOSIS — R252 Cramp and spasm: Secondary | ICD-10-CM

## 2016-09-17 DIAGNOSIS — R238 Other skin changes: Secondary | ICD-10-CM

## 2016-09-17 DIAGNOSIS — F419 Anxiety disorder, unspecified: Secondary | ICD-10-CM

## 2016-09-17 DIAGNOSIS — T502X5A Adverse effect of carbonic-anhydrase inhibitors, benzothiadiazides and other diuretics, initial encounter: Secondary | ICD-10-CM

## 2016-09-17 LAB — COMPREHENSIVE METABOLIC PANEL
ALT: 13 U/L (ref 0–35)
AST: 14 U/L (ref 0–37)
Albumin: 4.4 g/dL (ref 3.5–5.2)
Alkaline Phosphatase: 52 U/L (ref 39–117)
BUN: 9 mg/dL (ref 6–23)
CHLORIDE: 99 meq/L (ref 96–112)
CO2: 32 mEq/L (ref 19–32)
Calcium: 9.4 mg/dL (ref 8.4–10.5)
Creatinine, Ser: 0.69 mg/dL (ref 0.40–1.20)
GFR: 94.3 mL/min (ref >60.00)
GLUCOSE: 111 mg/dL — AB (ref 70–99)
Potassium: 3.3 mEq/L — ABNORMAL LOW (ref 3.5–5.1)
SODIUM: 137 meq/L (ref 135–145)
Total Bilirubin: 1.5 mg/dL — ABNORMAL HIGH (ref 0.2–1.2)
Total Protein: 6.9 g/dL (ref 6.0–8.3)

## 2016-09-17 LAB — CBC
HCT: 38.8 % (ref 36.0–46.0)
HEMOGLOBIN: 12.6 g/dL (ref 12.0–15.0)
MCHC: 32.5 g/dL (ref 30.0–36.0)
MCV: 83.2 fl (ref 78.0–100.0)
Platelets: 239 10*3/uL (ref 150.0–400.0)
RBC: 4.66 Mil/uL (ref 3.87–5.11)
RDW: 16.3 % — AB (ref 11.5–15.5)
WBC: 4.4 10*3/uL (ref 4.0–10.5)

## 2016-09-17 LAB — TSH: TSH: 1.1 u[IU]/mL (ref 0.35–4.50)

## 2016-09-17 MED ORDER — CLONAZEPAM 0.5 MG PO TABS: 0.5000 mg | ORAL_TABLET | Freq: Every day | ORAL | 0 refills | Status: DC | PRN

## 2016-09-17 NOTE — Progress Notes (Signed)
Subjective:    Patient ID: Diana [name], female    DOB: 07/21/1960, 56 y.o.   MRN: [id number]  HPI  Pt presents to the clinic today with c/o bruising. She reports she noticed this 1 month ago. She does not take blood thinners or ASA. She has not noticed any blood in her urine or her stool.  She also reports cramping in her feet. This has [name] going on for months. She reports the cramping radiates into her calves. She has [name] on Potassium for a few months, but potassium continues to be low.  She would also like a refill of her Clonazepam today. She takes this intermittent for anxiety. Her last refill was 06/17/16.  Review of Systems      Past Medical History:  Diagnosis Date  . Allergy   . Anxiety   . Ear infection    recurrent  . Hypertension     Current Outpatient Prescriptions  Medication Sig Dispense Refill  . cetirizine (ZYRTEC) 10 MG tablet Take 10 mg by mouth daily.    . clonazePAM (KLONOPIN) 0.5 MG tablet Take 1 tablet (0.5 mg total) by mouth daily as needed for anxiety. 30 tablet 0  . hydrochlorothiazide (MICROZIDE) 12.5 MG capsule TAKE 1 CAPSULE BY MOUTH DAILY. 30 capsule 2  . omeprazole (PRILOSEC) 20 MG capsule TAKE 1 CAPSULE (20 MG TOTAL) BY MOUTH DAILY. 30 capsule 6  . ondansetron (ZOFRAN-ODT) 4 MG disintegrating tablet DISSOLVE 1 TABLET ON THE TONGUE EVERY 8 HOURS AS NEEDED FOR NAUSEA. 10 tablet 0  . potassium chloride SA (K-DUR,KLOR-CON) 20 MEQ tablet Take 1 tablet (20 mEq total) by mouth 2 (two) times daily. 30 tablet 2   No current facility-administered medications for this visit.     No Known Allergies  Family History  Problem Relation Age of Onset  . Hypertension Mother        alive  . Arthritis Mother   . Lung cancer Father        deceased  . Heart disease Father   . Other Unknown        1 brother and 1 sister both alive an healthy  . Hypertension Maternal Grandmother     Social History   Social History  . Marital status: Married   Spouse name: N/A  . Number of children: 3  . Years of education: N/A   Occupational History  . customer service [employer]   Social History Main Topics  . Smoking status: Former Smoker    Types: Cigarettes    Quit date: 04/20/1985  . Smokeless tobacco: Never Used     Comment: remote tobacco use   . Alcohol use No  . Drug use: No  . Sexual activity: No   Other Topics Concern  . Not on file   Social History Narrative  . No narrative on file     Constitutional: Denies fever, malaise, fatigue, headache or abrupt weight changes.  Musculoskeletal: Pt reports cramping. Denies decrease in range of motion, difficulty with gait, or joint pain and swelling.  Skin: Pt reports easy bruising. Denies redness, rashes, lesions or ulcercations.  Psych: Pt has history of anxiety. Denies depression, SI/HI.  No other specific complaints in a complete review of systems (except as listed in HPI above).  Objective:   Physical Exam   BP 120/78   Pulse 72   Temp 98 F (36.7 C) (Oral)   Wt 203 lb (92.1 kg)   SpO2 98%   BMI  31.79 kg/m  Wt Readings from Last 3 Encounters:  09/17/16 203 lb (92.1 kg)  07/28/16 215 lb 4 oz (97.6 kg)  06/09/16 235 lb (106.6 kg)    General: Appears her stated age, obese in NAD. Skin: No bruises noted today. Musculoskeletal: No pain with palpation of the calf. No difficulty with gait. Psychiatric: Mood and affect normal. Behavior is normal. Judgment and thought content normal.     BMET    Component Value Date/Time   NA 140 07/31/2016 1200   K 3.3 (L) 08/21/2016 1108   CL 98 07/31/2016 1200   CO2 35 (H) 07/31/2016 1200   GLUCOSE 129 (H) 07/31/2016 1200   BUN 9 07/31/2016 1200   CREATININE 0.71 07/31/2016 1200   CALCIUM 9.2 07/31/2016 1200   GFRNONAA >60 05/26/2015 2059   GFRAA >60 05/26/2015 2059    Lipid Panel     Component Value Date/Time   CHOL 136 06/09/2016 1504   TRIG 77.0 06/09/2016 1504   HDL 18.20 (L) 06/09/2016 1504   CHOLHDL 7  06/09/2016 1504   VLDL 15.4 06/09/2016 1504   LDLCALC 102 (H) 06/09/2016 1504    CBC    Component Value Date/Time   WBC 4.7 07/28/2016 1232   RBC 4.82 07/28/2016 1232   HGB 13.0 07/28/2016 1232   HCT 39.6 07/28/2016 1232   PLT 253.0 07/28/2016 1232   MCV 82.2 07/28/2016 1232   MCH 31.6 05/26/2015 2059   MCHC 32.8 07/28/2016 1232   RDW 15.7 (H) 07/28/2016 1232   LYMPHSABS 1.1 07/28/2016 1232   MONOABS 0.4 07/28/2016 1232   EOSABS 0.0 07/28/2016 1232   BASOSABS 0.0 07/28/2016 1232    Hgb A1C Lab Results  Component Value Date   HGBA1C 5.9 06/09/2016           Assessment & Plan:   Easy Bruising:  CBC, CMET and TSH today  Muscle Cramping secondary to Diuretic Induced Hypokalemia:  Repeat CMET today Continue Potassium supplement for now May need to stop HCTZ, Potassium and switch to Aldactone  Anxiety:  Clonazepam refilled today  RTC as needed [name], NP

## 2016-09-17 NOTE — Patient Instructions (Signed)
Contusion A contusion is a deep bruise. Contusions happen when an injury causes bleeding under the skin. Symptoms of bruising include pain, swelling, and discolored skin. The skin may turn blue, purple, or yellow. Follow these instructions at home:  Rest the injured area.  If told, put ice on the injured area. ? Put ice in a plastic bag. ? Place a towel between your skin and the bag. ? Leave the ice on for 20 minutes, 2-3 times per day.  If told, put light pressure (compression) on the injured area using an elastic bandage. Make sure the bandage is not too tight. Remove it and put it back on as told by your doctor.  If possible, raise (elevate) the injured area above the level of your heart while you are sitting or lying down.  Take over-the-counter and prescription medicines only as told by your doctor. Contact a doctor if:  Your symptoms do not get better after several days of treatment.  Your symptoms get worse.  You have trouble moving the injured area. Get help right away if:  You have very bad pain.  You have a loss of feeling (numbness) in a hand or foot.  Your hand or foot turns pale or cold. This information is not intended to replace advice given to you by your health care provider. Make sure you discuss any questions you have with your health care provider. Document Released: 09/23/2007 Document Revised: 09/12/2015 Document Reviewed: 08/22/2014 Elsevier Interactive Patient Education  2018 Elsevier Inc.  

## 2016-09-21 ENCOUNTER — Telehealth: Payer: Self-pay | Admitting: Internal Medicine

## 2016-09-21 NOTE — Telephone Encounter (Signed)
Patient returned Diana [name]'s call. °

## 2016-09-23 ENCOUNTER — Other Ambulatory Visit: Payer: Self-pay | Admitting: Internal Medicine

## 2016-09-23 MED ORDER — SPIRONOLACTONE 25 MG PO TABS: 25.0000 mg | ORAL_TABLET | Freq: Every day | ORAL | 2 refills | Status: DC

## 2016-10-05 ENCOUNTER — Ambulatory Visit: Payer: BLUE CROSS/BLUE SHIELD | Admitting: Internal Medicine

## 2016-10-07 ENCOUNTER — Ambulatory Visit: Payer: BLUE CROSS/BLUE SHIELD | Admitting: Internal Medicine

## 2016-10-07 NOTE — Progress Notes (Deleted)
   Subjective:    Patient ID: Diana [name], female    DOB: 08/22/1960, 56 y.o.   MRN: [id number]  HPI  Pt presents to the clinic today for 2 week follow up for bruising and muscle cramps secondary to diuretic induced hypokalemia. CBC, CMET and TSH were negative. Her HCTZ and Potassium supplement were stopped and she was switched to Aldactone.   Review of Systems      Past Medical History:  Diagnosis Date  . Allergy   . Anxiety   . Ear infection    recurrent  . Hypertension     Current Outpatient Prescriptions  Medication Sig Dispense Refill  . cetirizine (ZYRTEC) 10 MG tablet Take 10 mg by mouth daily.    . clonazePAM (KLONOPIN) 0.5 MG tablet Take 1 tablet (0.5 mg total) by mouth daily as needed for anxiety. 30 tablet 0  . omeprazole (PRILOSEC) 20 MG capsule TAKE 1 CAPSULE (20 MG TOTAL) BY MOUTH DAILY. 30 capsule 6  . ondansetron (ZOFRAN-ODT) 4 MG disintegrating tablet DISSOLVE 1 TABLET ON THE TONGUE EVERY 8 HOURS AS NEEDED FOR NAUSEA. 10 tablet 0  . spironolactone (ALDACTONE) 25 MG tablet Take 1 tablet (25 mg total) by mouth daily. 30 tablet 2   No current facility-administered medications for this visit.     No Known Allergies  Family History  Problem Relation Age of Onset  . Hypertension Mother        alive  . Arthritis Mother   . Lung cancer Father        deceased  . Heart disease Father   . Other Unknown        1 brother and 1 sister both alive an healthy  . Hypertension Maternal Grandmother     Social History   Social History  . Marital status: Married    Spouse name: N/A  . Number of children: 3  . Years of education: N/A   Occupational History  . customer service [employer]   Social History Main Topics  . Smoking status: Former Smoker    Types: Cigarettes    Quit date: 04/20/1985  . Smokeless tobacco: Never Used     Comment: remote tobacco use   . Alcohol use No  . Drug use: No  . Sexual activity: No   Other Topics Concern  . Not on file    Social History Narrative  . No narrative on file     Constitutional: Denies fever, malaise, fatigue, headache or abrupt weight changes.  HEENT: Denies eye pain, eye redness, ear pain, ringing in the ears, wax buildup, runny nose, nasal congestion, bloody nose, or sore throat. Respiratory: Denies difficulty breathing, shortness of breath, cough or sputum production.   Cardiovascular: Denies chest pain, chest tightness, palpitations or swelling in the hands or feet.  Gastrointestinal: Denies abdominal pain, bloating, constipation, diarrhea or blood in the stool.  GU: Denies urgency, frequency, pain with urination, burning sensation, blood in urine, odor or discharge. Musculoskeletal: Denies decrease in range of motion, difficulty with gait, muscle pain or joint pain and swelling.  Skin: Denies redness, rashes, lesions or ulcercations.  Neurological: Denies dizziness, difficulty with memory, difficulty with speech or problems with balance and coordination.  Psych: Denies anxiety, depression, SI/HI.  No other specific complaints in a complete review of systems (except as listed in HPI above).  Objective:   Physical Exam        Assessment & Plan:

## 2016-10-08 ENCOUNTER — Encounter: Payer: Self-pay | Admitting: Internal Medicine

## 2016-10-08 ENCOUNTER — Ambulatory Visit (INDEPENDENT_AMBULATORY_CARE_PROVIDER_SITE_OTHER): Payer: BLUE CROSS/BLUE SHIELD | Admitting: Internal Medicine

## 2016-10-08 VITALS — BP 122/80 | HR 71 | Temp 98.1°F | Wt 202.5 lb

## 2016-10-08 DIAGNOSIS — E876 Hypokalemia: Secondary | ICD-10-CM

## 2016-10-08 DIAGNOSIS — T502X5A Adverse effect of carbonic-anhydrase inhibitors, benzothiadiazides and other diuretics, initial encounter: Secondary | ICD-10-CM

## 2016-10-08 DIAGNOSIS — T148XXA Other injury of unspecified body region, initial encounter: Secondary | ICD-10-CM

## 2016-10-08 MED ORDER — HYDROCHLOROTHIAZIDE 12.5 MG PO CAPS: 12.5000 mg | ORAL_CAPSULE | Freq: Every day | ORAL | 2 refills | Status: DC

## 2016-10-08 MED ORDER — POTASSIUM CHLORIDE ER 10 MEQ PO TBCR: 10.0000 meq | EXTENDED_RELEASE_TABLET | Freq: Two times a day (BID) | ORAL | 2 refills | Status: DC

## 2016-10-08 NOTE — Progress Notes (Signed)
Subjective:    Patient ID: Diana [name], female    DOB: 1961-02-25, 56 y.o.   MRN: [id number]  HPI  Pt presents to the clinic today for 2 week follow up for bruising and diuretic induced hypokalemia. CBC, CMET and TSH were all normal. Her HCTZ and Potassium were stopped and she was started on Aldactone. Since that time, she reports she continues to bruise, mainly on her arms. The bruising is not severe. She reports she gained 6 lbs within 3 days of stopping the HCTZ and Potassium and switching back to the Aldactone. She has stopped the Aldactone and resumed the HCTZ and Potassium.  Review of Systems      Past Medical History:  Diagnosis Date  . Allergy   . Anxiety   . Ear infection    recurrent  . Hypertension     Current Outpatient Prescriptions  Medication Sig Dispense Refill  . cetirizine (ZYRTEC) 10 MG tablet Take 10 mg by mouth daily.    . clonazePAM (KLONOPIN) 0.5 MG tablet Take 1 tablet (0.5 mg total) by mouth daily as needed for anxiety. 30 tablet 0  . omeprazole (PRILOSEC) 20 MG capsule TAKE 1 CAPSULE (20 MG TOTAL) BY MOUTH DAILY. 30 capsule 6  . ondansetron (ZOFRAN-ODT) 4 MG disintegrating tablet DISSOLVE 1 TABLET ON THE TONGUE EVERY 8 HOURS AS NEEDED FOR NAUSEA. 10 tablet 0  . spironolactone (ALDACTONE) 25 MG tablet Take 1 tablet (25 mg total) by mouth daily. 30 tablet 2   No current facility-administered medications for this visit.     No Known Allergies  Family History  Problem Relation Age of Onset  . Hypertension Mother        alive  . Arthritis Mother   . Lung cancer Father        deceased  . Heart disease Father   . Other Unknown        1 brother and 1 sister both alive an healthy  . Hypertension Maternal Grandmother     Social History   Social History  . Marital status: Married    Spouse name: N/A  . Number of children: 3  . Years of education: N/A   Occupational History  . customer service [employer]   Social History Main Topics  .  Smoking status: Former Smoker    Types: Cigarettes    Quit date: 04/20/1985  . Smokeless tobacco: Never Used     Comment: remote tobacco use   . Alcohol use No  . Drug use: No  . Sexual activity: No   Other Topics Concern  . Not on file   Social History Narrative  . No narrative on file     Constitutional: Pt reports abrupt weight gain (fluid). Denies fever, malaise, fatigue, headache.  Respiratory: Denies difficulty breathing, shortness of breath, cough or sputum production.   Cardiovascular: Pt reports swelling in legs. Denies chest pain, chest tightness, palpitations or swelling in the hands.  Skin: Pt reports bruising. Denies redness, rashes, lesions or ulcercations.    No other specific complaints in a complete review of systems (except as listed in HPI above).  Objective:   Physical Exam   BP 122/80   Pulse 71   Temp 98.1 F (36.7 C) (Oral)   Wt 202 lb 8 oz (91.9 kg)   SpO2 97%   BMI 31.72 kg/m  Wt Readings from Last 3 Encounters:  10/08/16 202 lb 8 oz (91.9 kg)  09/17/16 203 lb (92.1 kg)  07/28/16 215 lb 4 oz (97.6 kg)    General: Appears her stated age, obese in NAD. Skin: Warm, dry and intact. Small bruise noted over left lateral wrist. Cardiovascular: Normal rate and rhythm. No JVD or BLE edema.  Pulmonary/Chest: Normal effort and positive vesicular breath sounds. No respiratory distress. No wheezes, rales or ronchi noted.   BMET    Component Value Date/Time   NA 137 09/17/2016 1518   K 3.3 (L) 09/17/2016 1518   CL 99 09/17/2016 1518   CO2 32 09/17/2016 1518   GLUCOSE 111 (H) 09/17/2016 1518   BUN 9 09/17/2016 1518   CREATININE 0.69 09/17/2016 1518   CALCIUM 9.4 09/17/2016 1518   GFRNONAA >60 05/26/2015 2059   GFRAA >60 05/26/2015 2059    Lipid Panel     Component Value Date/Time   CHOL 136 06/09/2016 1504   TRIG 77.0 06/09/2016 1504   HDL 18.20 (L) 06/09/2016 1504   CHOLHDL 7 06/09/2016 1504   VLDL 15.4 06/09/2016 1504   LDLCALC 102 (H)  06/09/2016 1504    CBC    Component Value Date/Time   WBC 4.4 09/17/2016 1518   RBC 4.66 09/17/2016 1518   HGB 12.6 09/17/2016 1518   HCT 38.8 09/17/2016 1518   PLT 239.0 09/17/2016 1518   MCV 83.2 09/17/2016 1518   MCH 31.6 05/26/2015 2059   MCHC 32.5 09/17/2016 1518   RDW 16.3 (H) 09/17/2016 1518   LYMPHSABS 1.1 07/28/2016 1232   MONOABS 0.4 07/28/2016 1232   EOSABS 0.0 07/28/2016 1232   BASOSABS 0.0 07/28/2016 1232    Hgb A1C Lab Results  Component Value Date   HGBA1C 5.9 06/09/2016           Assessment & Plan:   Bruising:  Idiopathic No concern for leukemia etc Advised her to avoid NSAID's Monitor for now  Diuretic Induced Hypokalemia:  Will d/c Aldactone Continue HCTZ and Potassium  RTC in 2 weeks for repeat potassium, lab only Nicki ReaperBAITY, Makenley Shimp, NP

## 2016-10-08 NOTE — Patient Instructions (Signed)

## 2016-10-16 ENCOUNTER — Other Ambulatory Visit: Payer: Self-pay | Admitting: *Deleted

## 2016-10-16 MED ORDER — POTASSIUM CHLORIDE ER 10 MEQ PO TBCR: 10.0000 meq | EXTENDED_RELEASE_TABLET | Freq: Two times a day (BID) | ORAL | 2 refills | Status: DC

## 2016-10-16 NOTE — Telephone Encounter (Signed)
Spoke to pt who is requesting medication refill of potassium. Original Rx

## 2016-12-09 ENCOUNTER — Other Ambulatory Visit: Payer: Self-pay | Admitting: Family Medicine

## 2016-12-09 NOTE — Telephone Encounter (Signed)
Ok to phone in Clonazepam 

## 2016-12-09 NOTE — Telephone Encounter (Signed)
Verbal refill given to Stephanie at the pharmacy 

## 2016-12-09 NOTE — Telephone Encounter (Signed)
Last refill 09/17/16  Last OV 10/08/16 Ok to refill?

## 2016-12-24 ENCOUNTER — Ambulatory Visit (INDEPENDENT_AMBULATORY_CARE_PROVIDER_SITE_OTHER): Payer: BLUE CROSS/BLUE SHIELD | Admitting: Family Medicine

## 2016-12-24 ENCOUNTER — Encounter: Payer: Self-pay | Admitting: Family Medicine

## 2016-12-24 VITALS — BP 130/80 | HR 79 | Temp 98.5°F | Ht 67.0 in | Wt 192.8 lb

## 2016-12-24 DIAGNOSIS — E876 Hypokalemia: Secondary | ICD-10-CM

## 2016-12-24 DIAGNOSIS — J069 Acute upper respiratory infection, unspecified: Secondary | ICD-10-CM | POA: Diagnosis not present

## 2016-12-24 DIAGNOSIS — J301 Allergic rhinitis due to pollen: Secondary | ICD-10-CM | POA: Diagnosis not present

## 2016-12-24 NOTE — Assessment & Plan Note (Signed)
Symptomatic care 

## 2016-12-24 NOTE — Assessment & Plan Note (Signed)
Given allergies possible contributor.. Treat with antihistmine and nasal steroid

## 2016-12-24 NOTE — Assessment & Plan Note (Signed)
Overdue for re-eval.

## 2016-12-24 NOTE — Patient Instructions (Addendum)
Stop at lab on way out.   Rest, fluids, start zyrtec at bedtime, Nasocort 2 sprays per nostril daily.  Call if increasing shortness of breath, new fever or worsening.

## 2016-12-24 NOTE — Progress Notes (Signed)
   Subjective:    Patient ID: Diana [name], female    DOB: 04/15/1961, 56 y.o.   MRN: [id number]  HPI  56 year old female pt of Nicki ReaperRegina Baity with history of HTN and seasonal allergies presents with new onset  sore scratchy throat, nasal congestion and SOBx 4 days.   She reports first ST, sneeze and runny nose.  Some PND, minimal cough, hoarse off and on.  She occ feel SOB, no wheezing No fever No face pain, no ear pain.  She is gradually worsening.  No sick contacts except husband in hospital.  She taking Nasocort daily for allergies and zyrtec off and on.   no antibitoics in last month.    She is also due for potassium recheck off HCTZ, NSAIDS and on spironolactone.  BP Readings from Last 3 Encounters:  12/24/16 130/80  10/08/16 122/80  09/17/16 120/78   Blood pressure 130/80, pulse 79, temperature 98.5 F (36.9 C), temperature source Oral, height 5\' 7"  (1.702 m), weight 192 lb 12 oz (87.4 kg), SpO2 97 %.  Wt Readings from Last 3 Encounters:  12/24/16 192 lb 12 oz (87.4 kg)  10/08/16 202 lb 8 oz (91.9 kg)  09/17/16 203 lb (92.1 kg)   Review of Systems  Constitutional: Positive for fatigue. Negative for fever.  HENT: Negative for ear pain.   Eyes: Negative for pain.  Respiratory: Negative for chest tightness and shortness of breath.   Cardiovascular: Negative for chest pain, palpitations and leg swelling.  Gastrointestinal: Negative for abdominal pain.  Genitourinary: Negative for dysuria.       Objective:   Physical Exam  Constitutional: Vital signs are normal. She appears well-developed and well-nourished. She is cooperative.  Non-toxic appearance. She does not appear ill. No distress.  HENT:  Head: Normocephalic.  Right Ear: Hearing, tympanic membrane, external ear and ear canal normal. Tympanic membrane is not erythematous, not retracted and not bulging.  Left Ear: Hearing, tympanic membrane, external ear and ear canal normal. Tympanic membrane is not  erythematous, not retracted and not bulging.  Nose: Mucosal edema and rhinorrhea present. Right sinus exhibits no maxillary sinus tenderness and no frontal sinus tenderness. Left sinus exhibits no maxillary sinus tenderness and no frontal sinus tenderness.  Mouth/Throat: Uvula is midline, oropharynx is clear and moist and mucous membranes are normal.  Eyes: Pupils are equal, round, and reactive to light. Conjunctivae, EOM and lids are normal. Lids are everted and swept, no foreign bodies found.  Neck: Trachea normal and normal range of motion. Neck supple. Carotid bruit is not present. No thyroid mass and no thyromegaly present.  Cardiovascular: Normal rate, regular rhythm, S1 normal, S2 normal, normal heart sounds, intact distal pulses and normal pulses.  Exam reveals no gallop and no friction rub.   No murmur heard. Pulmonary/Chest: Effort normal and breath sounds normal. No tachypnea. No respiratory distress. She has no decreased breath sounds. She has no wheezes. She has no rhonchi. She has no rales.  Neurological: She is alert.  Skin: Skin is warm, dry and intact. No rash noted.  Psychiatric: Her speech is normal and behavior is normal. Judgment normal. Her mood appears not anxious. Cognition and memory are normal. She does not exhibit a depressed mood.          Assessment & Plan:

## 2016-12-25 ENCOUNTER — Ambulatory Visit: Payer: BLUE CROSS/BLUE SHIELD | Admitting: Internal Medicine

## 2016-12-25 LAB — POTASSIUM: Potassium: 3.7 mEq/L (ref 3.5–5.1)

## 2016-12-28 ENCOUNTER — Other Ambulatory Visit: Payer: Self-pay | Admitting: Internal Medicine

## 2017-01-08 ENCOUNTER — Ambulatory Visit (INDEPENDENT_AMBULATORY_CARE_PROVIDER_SITE_OTHER): Payer: BLUE CROSS/BLUE SHIELD | Admitting: Family Medicine

## 2017-01-08 ENCOUNTER — Telehealth: Payer: Self-pay | Admitting: *Deleted

## 2017-01-08 ENCOUNTER — Encounter: Payer: Self-pay | Admitting: Family Medicine

## 2017-01-08 DIAGNOSIS — H1031 Unspecified acute conjunctivitis, right eye: Secondary | ICD-10-CM | POA: Diagnosis not present

## 2017-01-08 MED ORDER — POLYMYXIN B-TRIMETHOPRIM 10000-0.1 UNIT/ML-% OP SOLN: 1.0000 [drp] | Freq: Four times a day (QID) | OPHTHALMIC | 0 refills | Status: DC

## 2017-01-08 NOTE — Progress Notes (Signed)
I'll defer to PCP about K and cramps.  She is on HCTZ.  She is still cramping.    R eye sx.  Started yesterday.  Noted some irritation and discharge.  No itching.  No trauma.  Was crusted this AM.  No FB sensation.  No FCNAVD.  She works at [employer], with [employer].  No L eye sx.  No vision changes.  No eye pain.  She wears contacts.  She doesn't have contacts.  She has monthly use contacts.  Meds, vitals, and allergies reviewed.   ROS: Per HPI unless specifically indicated in ROS section   nad ncat Tm wnl Nasal and OP exam wnl L conjunctiva normal. Normal extraocular movement bilaterally. Pupils equally round and reactive bilaterally. Limited funduscopic exam unremarkable. Right conjunctiva is injected with a scant amount of discharge. Neck supple, no LA

## 2017-01-08 NOTE — Patient Instructions (Signed)
Presumed pinkeye.  Stop using contacts in the right eye.  Don't use a contact until a few days after finishing the drops and when you don't have symptoms.  Take care.  Glad to see you.  Update [place] as needed.

## 2017-01-08 NOTE — Telephone Encounter (Signed)
Patient was in for an OV with Dr. Para March and asked about lab results from 2 weeks ago on her Potassium.  Patient advised and asked if she should increase her Potassium supplement.  [name] (PCP) consulted and suggested 2 tablets of KDur in the morning and 1 tablet in the PM.

## 2017-01-10 DIAGNOSIS — H1031 Unspecified acute conjunctivitis, right eye: Secondary | ICD-10-CM | POA: Insufficient documentation

## 2017-01-10 NOTE — Assessment & Plan Note (Addendum)
Presumed bacterial. Start Polytrim. Routine cautions given. Discussed with patient. She agrees.  See AVS.   She continues to have muscle cramping noted. I will defer to PCP about this.

## 2017-02-01 ENCOUNTER — Ambulatory Visit (INDEPENDENT_AMBULATORY_CARE_PROVIDER_SITE_OTHER): Payer: BLUE CROSS/BLUE SHIELD | Admitting: Internal Medicine

## 2017-02-01 ENCOUNTER — Encounter: Payer: Self-pay | Admitting: Internal Medicine

## 2017-02-01 VITALS — BP 122/80 | HR 78 | Temp 97.9°F | Wt 188.8 lb

## 2017-02-01 DIAGNOSIS — M545 Low back pain, unspecified: Secondary | ICD-10-CM

## 2017-02-01 MED ORDER — METHOCARBAMOL 500 MG PO TABS: 500.0000 mg | ORAL_TABLET | Freq: Every evening | ORAL | 0 refills | Status: DC | PRN

## 2017-02-01 MED ORDER — PREDNISONE 10 MG PO TABS: ORAL_TABLET | ORAL | 0 refills | Status: DC

## 2017-02-01 NOTE — Patient Instructions (Signed)

## 2017-02-01 NOTE — Progress Notes (Signed)
Subjective:    Patient ID: Diana [name], female    DOB: June 06, 1960, 56 y.o.   MRN: [id number]  HPI  Pt presents to the clinic today with c/o low back pain. This started 2 days ago. She describes the pain as sharp, stabbing and constant. The pain does not radiate. She does report associated muscle stiffness. She denies numbness or tingling in her legs, loss of bowel or bladder. She denies any injury to the area but reports she was cleaning a lot on Saturday. She has had a MRI of her lumbar spine in 2016, which showed:  IMPRESSION: 1. New left-sided disc extrusion at L4-5 likely affecting the left L4 nerve in the lateral recess and neural foramen. Similar appearance of moderate spinal stenosis and right lateral recess stenosis. 2. Interval retraction of L5-S1 disc extrusion on the prior study. Similar appearance of moderate bilateral lateral recess stenosis at the L5-S1 disc space level.  She has tried Ibuprofen with minimal relief.  Review of Systems      Past Medical History:  Diagnosis Date  . Allergy   . Anxiety   . Ear infection    recurrent  . Hypertension     Current Outpatient Prescriptions  Medication Sig Dispense Refill  . cetirizine (ZYRTEC) 10 MG tablet Take 10 mg by mouth daily.    . clonazePAM (KLONOPIN) 0.5 MG tablet TAKE 1 TABLET BY MOUTH DAILY AS NEEDED FOR ANXIETY 30 tablet 0  . hydrochlorothiazide (MICROZIDE) 12.5 MG capsule TAKE 1 CAPSULE BY MOUTH DAILY. 30 capsule 2  . omeprazole (PRILOSEC) 20 MG capsule TAKE 1 CAPSULE (20 MG TOTAL) BY MOUTH DAILY. 30 capsule 6  . potassium chloride (K-DUR) 10 MEQ tablet Take 2 tablets in the morning and 1 tablet in the evening.    . trimethoprim-polymyxin b (POLYTRIM) ophthalmic solution Place 1 drop into the right eye 4 (four) times daily. 10 mL 0   No current facility-administered medications for this visit.     No Known Allergies  Family History  Problem Relation Age of Onset  . Hypertension Mother    alive  . Arthritis Mother   . Lung cancer Father        deceased  . Heart disease Father   . Other Unknown        1 brother and 1 sister both alive an healthy  . Hypertension Maternal Grandmother     Social History   Social History  . Marital status: Married    Spouse name: N/A  . Number of children: 3  . Years of education: N/A   Occupational History  . customer service [employer]   Social History Main Topics  . Smoking status: Former Smoker    Types: Cigarettes    Quit date: 04/20/1985  . Smokeless tobacco: Never Used     Comment: remote tobacco use   . Alcohol use No  . Drug use: No  . Sexual activity: No   Other Topics Concern  . Not on file   Social History Narrative  . No narrative on file     Constitutional: Denies fever, malaise, fatigue, headache or abrupt weight changes.  Gastrointestinal: Denies abdominal pain, bloating, constipation, diarrhea or blood in the stool.  GU: Denies urgency, frequency, pain with urination, burning sensation, blood in urine, odor or discharge. Musculoskeletal: Pt reports back pain. Denies difficulty with gait, or joint swelling.    No other specific complaints in a complete review of systems (except as listed in  HPI above).  Objective:   Physical Exam   Pulse 78   Temp 97.9 F (36.6 C) (Oral)   Wt 188 lb 12 oz (85.6 kg)   SpO2 98%   BMI 29.56 kg/m  Wt Readings from Last 3 Encounters:  02/01/17 188 lb 12 oz (85.6 kg)  01/08/17 189 lb 8 oz (86 kg)  12/24/16 192 lb 12 oz (87.4 kg)    General: Appears her stated age, well developed, well nourished in NAD. Musculoskeletal:  Decreased extension and rotation of the spine. Normal extension. Pain with palpation over the lumbar spine. Gait slow but steady. Neurological: Alert and oriented. Sensation intact to BLE.  BMET    Component Value Date/Time   NA 137 09/17/2016 1518   K 3.7 12/24/2016 1651   CL 99 09/17/2016 1518   CO2 32 09/17/2016 1518   GLUCOSE 111 (H)  09/17/2016 1518   BUN 9 09/17/2016 1518   CREATININE 0.69 09/17/2016 1518   CALCIUM 9.4 09/17/2016 1518   GFRNONAA >60 05/26/2015 2059   GFRAA >60 05/26/2015 2059    Lipid Panel     Component Value Date/Time   CHOL 136 06/09/2016 1504   TRIG 77.0 06/09/2016 1504   HDL 18.20 (L) 06/09/2016 1504   CHOLHDL 7 06/09/2016 1504   VLDL 15.4 06/09/2016 1504   LDLCALC 102 (H) 06/09/2016 1504    CBC    Component Value Date/Time   WBC 4.4 09/17/2016 1518   RBC 4.66 09/17/2016 1518   HGB 12.6 09/17/2016 1518   HCT 38.8 09/17/2016 1518   PLT 239.0 09/17/2016 1518   MCV 83.2 09/17/2016 1518   MCH 31.6 05/26/2015 2059   MCHC 32.5 09/17/2016 1518   RDW 16.3 (H) 09/17/2016 1518   LYMPHSABS 1.1 07/28/2016 1232   MONOABS 0.4 07/28/2016 1232   EOSABS 0.0 07/28/2016 1232   BASOSABS 0.0 07/28/2016 1232    Hgb A1C Lab Results  Component Value Date   HGBA1C 5.9 06/09/2016           Assessment & Plan:   Acute Low Back Pain:  Stretching exercises given Heat may be helpful eRx for Pred Taper x 6 days eRx for Methocarbamol 500 mg QHS prn No NSAIDs, while on Prednisone. Take Tylenol if need for severe pain  RTC as needed if symptoms persist or worsen Careli Luzader, NP

## 2017-02-25 ENCOUNTER — Telehealth: Payer: Self-pay | Admitting: Internal Medicine

## 2017-02-25 MED ORDER — POTASSIUM CHLORIDE ER 10 MEQ PO TBCR: EXTENDED_RELEASE_TABLET | ORAL | 2 refills | Status: DC

## 2017-02-25 NOTE — Telephone Encounter (Signed)
Copied from CRM #[id number]. Topic: Quick Communication - See Telephone Encounter >> Feb 25, 2017  2:53 PM Landry MellowFoltz, Melissa J wrote: CRM for notification. See Telephone encounter for:   02/25/17. Pt needs refill on potassium chloride (K-DUR) 10 MEQ tablet. She states that the last rx did not have correct quanity and directions. She states she is taking 3 per day. [facility]. She needs refill asap as she is almost out of meds.  Cb number for pt is [phone]

## 2017-02-25 NOTE — Telephone Encounter (Signed)
Pt is taking 3 tabs daily 2 tabs in AM and 1 tab in PM per med list sig... Rx sent through e-scribe

## 2017-02-25 NOTE — Telephone Encounter (Signed)
Please   Take  A  Look  At this  Refill  Request  Please     Thank  You

## 2017-04-05 ENCOUNTER — Ambulatory Visit: Payer: Self-pay

## 2017-04-05 ENCOUNTER — Emergency Department (HOSPITAL_COMMUNITY)
Admission: EM | Admit: 2017-04-05 | Discharge: 2017-04-05 | Disposition: A | Discharge status: Home / Self Care | Payer: BLUE CROSS/BLUE SHIELD | Attending: Emergency Medicine | Admitting: Emergency Medicine

## 2017-04-05 ENCOUNTER — Other Ambulatory Visit: Payer: Self-pay

## 2017-04-05 ENCOUNTER — Encounter (HOSPITAL_COMMUNITY): Payer: Self-pay

## 2017-04-05 DIAGNOSIS — Z87891 Personal history of nicotine dependence: Secondary | ICD-10-CM | POA: Insufficient documentation

## 2017-04-05 DIAGNOSIS — I10 Essential (primary) hypertension: Secondary | ICD-10-CM | POA: Diagnosis not present

## 2017-04-05 DIAGNOSIS — M25512 Pain in left shoulder: Secondary | ICD-10-CM | POA: Diagnosis not present

## 2017-04-05 DIAGNOSIS — Z79899 Other long term (current) drug therapy: Secondary | ICD-10-CM | POA: Diagnosis not present

## 2017-04-05 MED ORDER — NAPROXEN 375 MG PO TABS: 375.0000 mg | ORAL_TABLET | Freq: Two times a day (BID) | ORAL | 0 refills | Status: DC

## 2017-04-05 NOTE — Telephone Encounter (Signed)
Pt. called to report sudden onset of left shoulder pain yesterday.  Reported the pain is located on top of the shoulder and radiates down to the upper left arm.  Rates pain at 8/10.  Reported she can't use her arm. Stated Advil didn't help the pain at all.  Denied hx of cardiac issues.  Denied any chest pain or shortness of breath.  Denies increased pain in shoulder with deep breath.  Stated she has no recent hx of shoulder injury.   Unable to reach the Flow Coordinator at [facility].  Advised pt., per protocol, she should go to the ER.  Pt. Stated she has someone to driver her to the ER.  Agreed with plan.  Reason for Disposition . [1] SEVERE pain AND [2] not improved 2 hours after pain medicine  Answer Assessment - Initial Assessment Questions 1. ONSET: "When did the pain start?"     Yesterday  2. LOCATION: "Where is the pain located?"     On top of left shoulder; radiates into the upper arm   3. PAIN: "How bad is the pain?" (Scale 1-10; or mild, moderate, severe)   - MILD (1-3): doesn't interfere with normal activities   - MODERATE (4-7): interferes with normal activities (e.g., work or school) or awakens from sleep   - SEVERE (8-10): excruciating pain, unable to do any normal activities, unable to move arm at all due to pain     8/ 10  4. WORK OR EXERCISE: "Has there been any recent work or exercise that involved this part of the body?"     No known injury 5. CAUSE: "What do you think is causing the shoulder pain?"     Unknown  6. OTHER SYMPTOMS: "Do you have any other symptoms?" (e.g., neck pain, swelling, rash, fever, numbness, weakness)     No neck pain, no swelling; no numbness or weakness; no chest pain  PREGNANCY: "Is there any chance you are pregnant?" "When was your last menstrual period?"    No ; LMP 01/2016  Protocols used: SHOULDER PAIN-A-AH

## 2017-04-05 NOTE — ED Triage Notes (Signed)
Pt states she has been having left shoulder pain that began yesterday. She reports pain when putting pressure on the arm or when she raises her arm. Limited ROM noted. Pt denies any injury.

## 2017-04-05 NOTE — Telephone Encounter (Signed)
She could have been seen here in the office tomorrow instead of going to the ER.

## 2017-04-05 NOTE — ED Provider Notes (Signed)
[facility] EMERGENCY DEPARTMENT Provider Note   CSN: [id number] Arrival date & time: 04/05/17  1515     History   Chief Complaint Chief Complaint  Patient presents with  . Shoulder Pain    HPI Diana [name] is a 56 y.o. female.  Patient is a 56 year old female who presents with left shoulder pain.  She states it started yesterday while she was driving.  It is in the posterior left shoulder area.  It is nonradiating.  There is no numbness or weakness to the arm.  No associated chest pain or shortness of breath.  No nausea.  No diaphoresis.  She states the pain is worse when she moves her shoulder around.  She denies any injuries to the shoulder.  No history of similar symptoms in the past.      Past Medical History:  Diagnosis Date  . Allergy   . Anxiety   . Ear infection    recurrent  . Hypertension     Patient Active Problem List   Diagnosis Date Noted  . Gastrointestinal symptoms 07/28/2016  . IBS (irritable bowel syndrome) 07/28/2016  . GERD (gastroesophageal reflux disease) 06/09/2016  . Essential hypertension 03/07/2015  . Seasonal allergies 03/07/2015  . Generalized anxiety disorder 03/07/2015  . Ruptured lumbar disc 03/07/2015    Past Surgical History:  Procedure Laterality Date  . TONSILLECTOMY    . TUBAL LIGATION     bilateral    OB History    No data available       Home Medications    Prior to Admission medications   Medication Sig Start Date End Date Taking? Authorizing Provider  cetirizine (ZYRTEC) 10 MG tablet Take 10 mg by mouth daily.    [provider]  clonazePAM (KLONOPIN) 0.5 MG tablet TAKE 1 TABLET BY MOUTH DAILY AS NEEDED FOR ANXIETY 12/09/16   [name], NP  hydrochlorothiazide (MICROZIDE) 12.5 MG capsule TAKE 1 CAPSULE BY MOUTH DAILY. 12/29/16   [name], NP  methocarbamol (ROBAXIN) 500 MG tablet Take 1 tablet (500 mg total) by mouth at bedtime as needed for muscle spasms. 02/01/17    [name], NP  naproxen (NAPROSYN) 375 MG tablet Take 1 tablet (375 mg total) by mouth 2 (two) times daily. 04/05/17   Rolan Bucco, MD  omeprazole (PRILOSEC) 20 MG capsule TAKE 1 CAPSULE (20 MG TOTAL) BY MOUTH DAILY. 07/28/16   Dianne Dun, MD  potassium chloride (K-DUR) 10 MEQ tablet Take 2 tablets in the morning and 1 tablet in the evening. 02/25/17   [name], NP  predniSONE (DELTASONE) 10 MG tablet Take 3 tabs on days 1-2, take 2 tabs on days 3-4, take 1 tab on days 5-6 02/01/17   [name], NP    Family History Family History  Problem Relation Age of Onset  . Hypertension Mother        alive  . Arthritis Mother   . Lung cancer Father        deceased  . Heart disease Father   . Other Unknown        1 brother and 1 sister both alive an healthy  . Hypertension Maternal Grandmother     Social History Social History   Tobacco Use  . Smoking status: Former Smoker    Types: Cigarettes    Last attempt to quit: 04/20/1985    Years since quitting: 31.9  . Smokeless tobacco: Never Used  . Tobacco comment: remote  tobacco use   Substance Use Topics  . Alcohol use: No    Alcohol/week: 0.0 oz  . Drug use: No     Allergies   Patient has no known allergies.   Review of Systems Review of Systems  Constitutional: Negative for chills, diaphoresis, fatigue and fever.  HENT: Negative for congestion, rhinorrhea and sneezing.   Eyes: Negative.   Respiratory: Negative for cough, chest tightness and shortness of breath.   Cardiovascular: Negative for chest pain and leg swelling.  Gastrointestinal: Negative for abdominal pain, blood in stool, diarrhea, nausea and vomiting.  Genitourinary: Negative for difficulty urinating, flank pain, frequency and hematuria.  Musculoskeletal: Positive for arthralgias. Negative for back pain.  Skin: Negative for rash.  Neurological: Negative for dizziness, speech difficulty, weakness, numbness and headaches.     Physical  Exam Updated Vital Signs BP (!) 142/84   Pulse 73   Temp 97.9 F (36.6 C) (Oral)   Resp 16   Wt 83.5 kg (184 lb)   SpO2 97%   BMI 28.82 kg/m   Physical Exam  Constitutional: She is oriented to person, place, and time. She appears well-developed and well-nourished.  HENT:  Head: Normocephalic and atraumatic.  Eyes: Pupils are equal, round, and reactive to light.  Neck: Normal range of motion. Neck supple.  Cardiovascular: Normal rate, regular rhythm and normal heart sounds.  Pulmonary/Chest: Effort normal and breath sounds normal. No respiratory distress. She has no wheezes. She has no rales. She exhibits no tenderness.  Abdominal: Soft. Bowel sounds are normal. There is no tenderness. There is no rebound and no guarding.  Musculoskeletal: Normal range of motion. She exhibits no edema.  Patient has pain on range of motion of the left shoulder, primarily on abduction and external rotation of the shoulder.  There is no palpable tenderness.  No swelling or deformity.  She has intact radial pulses.  No numbness or weakness to the arm.  Lymphadenopathy:    She has no cervical adenopathy.  Neurological: She is alert and oriented to person, place, and time.  Skin: Skin is warm and dry. No rash noted.  Psychiatric: She has a normal mood and affect.     ED Treatments / Results  Labs (all labs ordered are listed, but only abnormal results are displayed) Labs Reviewed - No data to display  EKG  EKG Interpretation  Date/Time:  Monday April 05 2017 16:26:28 EST Ventricular Rate:  71 PR Interval:  150 QRS Duration: 72 QT Interval:  380 QTC Calculation: 412 R Axis:   49 Text Interpretation:  Normal sinus rhythm Normal ECG since last tracing no significant change Confirmed by Rolan BuccoBelfi, Mackensey Bolte [phone]([id number]) on 04/05/2017 4:31:29 PM       Radiology No results found.  Procedures Procedures (including critical care time)  Medications Ordered in ED Medications - No data to  display   Initial Impression / Assessment and Plan / ED Course  I have reviewed the triage vital signs and the nursing notes.  Pertinent labs & imaging results that were available during my care of the patient were reviewed by me and considered in my medical decision making (see chart for details).     Patient presents with left shoulder pain.  It is worse with range of motion of the shoulder.  It seems to be musculoskeletal in nature.  There is no neurologic deficits.  No other associated symptoms that would be more concerning for a cardiac etiology.  Her EKG does not show any ischemic changes.  Given the reproducibility of the pain, I feel it is likely musculoskeletal.  She was given a prescription for Naprosyn for pain.  She was encouraged to follow-up with her PCP if her symptoms are not improving.  Final Clinical Impressions(s) / ED Diagnoses   Final diagnoses:  Acute pain of left shoulder    ED Discharge Orders        Ordered    naproxen (NAPROSYN) 375 MG tablet  2 times daily     04/05/17 1630       Rolan BuccoBelfi, Jansel Vonstein, MD 04/05/17 [phone]

## 2017-04-20 HISTORY — PX: ABDOMINAL HYSTERECTOMY: SHX81

## 2017-04-26 ENCOUNTER — Other Ambulatory Visit: Payer: Self-pay | Admitting: Internal Medicine

## 2017-04-26 ENCOUNTER — Other Ambulatory Visit: Payer: Self-pay | Admitting: Family Medicine

## 2017-05-31 DIAGNOSIS — N950 Postmenopausal bleeding: Secondary | ICD-10-CM | POA: Diagnosis not present

## 2017-05-31 DIAGNOSIS — N841 Polyp of cervix uteri: Secondary | ICD-10-CM | POA: Diagnosis not present

## 2017-06-01 ENCOUNTER — Other Ambulatory Visit: Payer: Self-pay | Admitting: Internal Medicine

## 2017-06-01 MED ORDER — CLONAZEPAM 0.5 MG PO TABS: 0.5000 mg | ORAL_TABLET | Freq: Every day | ORAL | 0 refills | Status: DC | PRN

## 2017-06-01 NOTE — Telephone Encounter (Signed)
Refill of Klonopin  LOV 02/01/17    with R.Baity  LRF 12/09/16     #30    0 refills  [facility] - [address] [place] [address] Phone: [phone] Fax: [phone]

## 2017-06-01 NOTE — Telephone Encounter (Signed)
Copied from CRM [id number]. Topic: Quick Communication - Rx Refill/Question >> Jun 01, 2017  2:03 PM Darletta MollLander, Lumin L wrote: Medication: clonazePAM (KLONOPIN) 0.5 MG tablet  Has the patient contacted their pharmacy? Yes.   (Agent: If no, request that the patient contact the pharmacy for the refill.) Preferred Pharmacy (with phone number or street name): [facility] [place] [address] [place] [address] Phone: [phone] Fax: [phone] Agent: Please be advised that RX refills may take up to 3 business days. We ask that you follow-up with your pharmacy.

## 2017-06-01 NOTE — Telephone Encounter (Signed)
Last filled 12/09/16... Please advise

## 2017-06-08 ENCOUNTER — Ambulatory Visit (INDEPENDENT_AMBULATORY_CARE_PROVIDER_SITE_OTHER): Payer: BLUE CROSS/BLUE SHIELD | Admitting: Internal Medicine

## 2017-06-08 ENCOUNTER — Encounter: Payer: Self-pay | Admitting: Internal Medicine

## 2017-06-08 DIAGNOSIS — I10 Essential (primary) hypertension: Secondary | ICD-10-CM

## 2017-06-09 ENCOUNTER — Encounter: Payer: Self-pay | Admitting: Internal Medicine

## 2017-06-09 NOTE — Progress Notes (Signed)
Subjective:    Patient ID: Diana [name], female    DOB: 06/29/1960, 57 y.o.   MRN: [id number]  HPI  Pt presents to the clinic today for a BP check. She reports she went to her GYN 1 week ago. Her BP at their office was 138/92. She is taking HCTZ and Potassium as prescribed. Her BP today is 126/84.  Review of Systems      Past Medical History:  Diagnosis Date  . Allergy   . Anxiety   . Ear infection    recurrent  . Hypertension     Current Outpatient Medications  Medication Sig Dispense Refill  . cetirizine (ZYRTEC) 10 MG tablet Take 10 mg by mouth daily.    . clonazePAM (KLONOPIN) 0.5 MG tablet Take 1 tablet (0.5 mg total) by mouth daily as needed. for anxiety 30 tablet 0  . hydrochlorothiazide (MICROZIDE) 12.5 MG capsule Take 1 capsule (12.5 mg total) by mouth daily. MUST SCHEDULE ANNUAL PHYSICAL 30 capsule 1  . omeprazole (PRILOSEC) 20 MG capsule TAKE 1 CAPSULE BY MOUTH DAILY. 30 capsule 6  . potassium chloride (K-DUR) 10 MEQ tablet Take 2 tablets in the morning and 1 tablet in the evening. 90 tablet 2   No current facility-administered medications for this visit.     No Known Allergies  Family History  Problem Relation Age of Onset  . Hypertension Mother        alive  . Arthritis Mother   . Lung cancer Father        deceased  . Heart disease Father   . Other Unknown        1 brother and 1 sister both alive an healthy  . Hypertension Maternal Grandmother     Social History   Socioeconomic History  . Marital status: Married    Spouse name: Not on file  . Number of children: 3  . Years of education: Not on file  . Highest education level: Not on file  Social Needs  . Financial resource strain: Not on file  . Food insecurity - worry: Not on file  . Food insecurity - inability: Not on file  . Transportation needs - medical: Not on file  . Transportation needs - non-medical: Not on file  Occupational History  . Occupation: Nutritional therapistcustomer service    Employer:  [employer]  Tobacco Use  . Smoking status: Former Smoker    Types: Cigarettes    Last attempt to quit: 04/20/1985    Years since quitting: 32.1  . Smokeless tobacco: Never Used  . Tobacco comment: remote tobacco use   Substance and Sexual Activity  . Alcohol use: No    Alcohol/week: 0.0 oz  . Drug use: No  . Sexual activity: No  Other Topics Concern  . Not on file  Social History Narrative  . Not on file     Constitutional: Denies fever, malaise, fatigue, headache or abrupt weight changes.  Respiratory: Denies difficulty breathing, shortness of breath, cough or sputum production.   Cardiovascular: Denies chest pain, chest tightness, palpitations or swelling in the hands or feet.    No other specific complaints in a complete review of systems (except as listed in HPI above).  Objective:   Physical Exam   BP 126/84   Pulse 69   Temp 97.9 F (36.6 C) (Oral)   Wt 194 lb (88 kg)   SpO2 98%   BMI 30.38 kg/m  Wt Readings from Last 3 Encounters:  06/08/17  194 lb (88 kg)  04/05/17 184 lb (83.5 kg)  02/01/17 188 lb 12 oz (85.6 kg)    General: Appears her stated age, obese in NAD.  Cardiovascular: Normal rate and rhythm. S1,S2 noted.  No murmur, rubs or gallops noted.  Pulmonary/Chest: Normal effort and positive vesicular breath sounds. No respiratory distress. No wheezes, rales or ronchi noted.  Neurological: Alert and oriented.   BMET    Component Value Date/Time   NA 137 09/17/2016 1518   K 3.7 12/24/2016 1651   CL 99 09/17/2016 1518   CO2 32 09/17/2016 1518   GLUCOSE 111 (H) 09/17/2016 1518   BUN 9 09/17/2016 1518   CREATININE 0.69 09/17/2016 1518   CALCIUM 9.4 09/17/2016 1518   GFRNONAA >60 05/26/2015 2059   GFRAA >60 05/26/2015 2059    Lipid Panel     Component Value Date/Time   CHOL 136 06/09/2016 1504   TRIG 77.0 06/09/2016 1504   HDL 18.20 (L) 06/09/2016 1504   CHOLHDL 7 06/09/2016 1504   VLDL 15.4 06/09/2016 1504   LDLCALC 102 (H) 06/09/2016  1504    CBC    Component Value Date/Time   WBC 4.4 09/17/2016 1518   RBC 4.66 09/17/2016 1518   HGB 12.6 09/17/2016 1518   HCT 38.8 09/17/2016 1518   PLT 239.0 09/17/2016 1518   MCV 83.2 09/17/2016 1518   MCH 31.6 05/26/2015 2059   MCHC 32.5 09/17/2016 1518   RDW 16.3 (H) 09/17/2016 1518   LYMPHSABS 1.1 07/28/2016 1232   MONOABS 0.4 07/28/2016 1232   EOSABS 0.0 07/28/2016 1232   BASOSABS 0.0 07/28/2016 1232    Hgb A1C Lab Results  Component Value Date   HGBA1C 5.9 06/09/2016           Assessment & Plan:

## 2017-06-09 NOTE — Patient Instructions (Signed)

## 2017-06-09 NOTE — Assessment & Plan Note (Signed)
Normal today Advised her we could increase her HCTZ or change it to Lisinopril but she declines at this time Continue HCTZ and Potassium Reinforced DASH diet and exercise for weight loss

## 2017-06-30 ENCOUNTER — Encounter: Payer: Self-pay | Admitting: Internal Medicine

## 2017-06-30 ENCOUNTER — Ambulatory Visit (INDEPENDENT_AMBULATORY_CARE_PROVIDER_SITE_OTHER): Payer: BLUE CROSS/BLUE SHIELD | Admitting: Internal Medicine

## 2017-06-30 VITALS — BP 120/78 | HR 75 | Temp 97.9°F | Ht 66.5 in | Wt 193.0 lb

## 2017-06-30 DIAGNOSIS — Z1239 Encounter for other screening for malignant neoplasm of breast: Secondary | ICD-10-CM

## 2017-06-30 DIAGNOSIS — K219 Gastro-esophageal reflux disease without esophagitis: Secondary | ICD-10-CM | POA: Diagnosis not present

## 2017-06-30 DIAGNOSIS — Z0000 Encounter for general adult medical examination without abnormal findings: Secondary | ICD-10-CM | POA: Diagnosis not present

## 2017-06-30 DIAGNOSIS — F411 Generalized anxiety disorder: Secondary | ICD-10-CM | POA: Diagnosis not present

## 2017-06-30 DIAGNOSIS — I10 Essential (primary) hypertension: Secondary | ICD-10-CM

## 2017-06-30 LAB — CBC
HCT: 39.7 % (ref 36.0–46.0)
HEMOGLOBIN: 13.5 g/dL (ref 12.0–15.0)
MCHC: 33.9 g/dL (ref 30.0–36.0)
MCV: 88 fl (ref 78.0–100.0)
PLATELETS: 172 10*3/uL (ref 150.0–400.0)
RBC: 4.52 Mil/uL (ref 3.87–5.11)
RDW: 14.8 % (ref 11.5–15.5)
WBC: 4 10*3/uL (ref 4.0–10.5)

## 2017-06-30 LAB — LIPID PANEL
CHOL/HDL RATIO: 7
Cholesterol: 98 mg/dL (ref 0–200)
HDL: 13.9 mg/dL — AB (ref >39.00)
LDL Cholesterol: 60 mg/dL (ref 0–99)
NONHDL: 84.1
TRIGLYCERIDES: 123 mg/dL (ref 0.0–149.0)
VLDL: 24.6 mg/dL (ref 0.0–40.0)

## 2017-06-30 LAB — COMPREHENSIVE METABOLIC PANEL
ALBUMIN: 3.9 g/dL (ref 3.5–5.2)
ALK PHOS: 121 U/L — AB (ref 39–117)
ALT: 548 U/L — AB (ref 0–35)
AST: 337 U/L — AB (ref 0–37)
BILIRUBIN TOTAL: 0.9 mg/dL (ref 0.2–1.2)
BUN: 12 mg/dL (ref 6–23)
CALCIUM: 9.4 mg/dL (ref 8.4–10.5)
CO2: 35 mEq/L — ABNORMAL HIGH (ref 19–32)
CREATININE: 0.54 mg/dL (ref 0.40–1.20)
Chloride: 97 mEq/L (ref 96–112)
GFR: 124.76 mL/min (ref >60.00)
Glucose, Bld: 112 mg/dL — ABNORMAL HIGH (ref 70–99)
Potassium: 3.5 mEq/L (ref 3.5–5.1)
Sodium: 138 mEq/L (ref 135–145)
TOTAL PROTEIN: 6.7 g/dL (ref 6.0–8.3)

## 2017-06-30 LAB — VITAMIN D 25 HYDROXY (VIT D DEFICIENCY, FRACTURES): VITD: 13.49 ng/mL — ABNORMAL LOW (ref 30.00–100.00)

## 2017-06-30 NOTE — Progress Notes (Signed)
Subjective:    Patient ID: Diana [name], female    DOB: [birth year], 57 y.o.   MRN: [id number]  HPI  Pt presents to the clinic today for her annual exam. She is also due to follow up chronic conditions.  HTN: Her BP today is 120/78. She is taking HCTZ daily as prescribed. ECG From 03/2017 reviewed.  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 She is taking Clonazepam very sparingly. She denies depression, SI/HI>  GERD: She denies breakthrough symptoms on Omeprazole.  Flu: never Tetanus: 03/2015 Pap Smear: 06/07/17 Mammogram: 2013 Colon Screening: > 10 years ago Vision Screening: as needed Dentist: yearly  Diet: She does eat meat. She consumes some fruits and veggies. She tries to avoid fried foods. She drinks mostly coffee and water. Exercise: None  Review of Systems      Past Medical History:  Diagnosis Date  . Allergy   . Anxiety   . Ear infection    recurrent  . Hypertension     Current Outpatient Medications  Medication Sig Dispense Refill  . cetirizine (ZYRTEC) 10 MG tablet Take 10 mg by mouth daily.    . clonazePAM (KLONOPIN) 0.5 MG tablet Take 1 tablet (0.5 mg total) by mouth daily as needed. for anxiety 30 tablet 0  . hydrochlorothiazide (MICROZIDE) 12.5 MG capsule Take 1 capsule (12.5 mg total) by mouth daily. MUST SCHEDULE ANNUAL PHYSICAL 30 capsule 1  . omeprazole (PRILOSEC) 20 MG capsule TAKE 1 CAPSULE BY MOUTH DAILY. 30 capsule 6  . potassium chloride (K-DUR) 10 MEQ tablet Take 2 tablets in the morning and 1 tablet in the evening. 90 tablet 2   No current facility-administered medications for this visit.     No Known Allergies  Family History  Problem Relation Age of Onset  . Hypertension Mother        alive  . Arthritis Mother   . Lung cancer Father        deceased  . Heart disease Father   . Other Unknown        1 brother and 1 sister both alive an healthy  . Hypertension Maternal Grandmother      Social History   Socioeconomic History  . Marital status: Married    Spouse name: Not on file  . Number of children: 3  . Years of education: Not on file  . Highest education level: Not on file  Social Needs  . Financial resource strain: Not on file  . Food insecurity - worry: Not on file  . Food insecurity - inability: Not on file  . Transportation needs - medical: Not on file  . Transportation needs - non-medical: Not on file  Occupational History  . Occupation: Nutritional therapist: [employer]  Tobacco Use  . Smoking status: Former Smoker    Types: Cigarettes    Last attempt to quit: 04/20/1985    Years since quitting: 32.2  . Smokeless tobacco: Never Used  . Tobacco comment: remote tobacco use   Substance and Sexual Activity  . Alcohol use: No    Alcohol/week: 0.0 oz  . Drug use: No  . Sexual activity: No  Other Topics Concern  . Not on file  Social History Narrative  . Not on file     Constitutional: Denies fever, malaise, fatigue, headache or abrupt weight changes.  HEENT: Denies eye pain, eye redness, ear pain, ringing in the ears, wax buildup, runny nose, nasal  congestion, bloody nose, or sore throat. Respiratory: Denies difficulty breathing, shortness of breath, cough or sputum production.   Cardiovascular: Denies chest pain, chest tightness, palpitations or swelling in the hands or feet.  Gastrointestinal: Denies abdominal pain, bloating, constipation, diarrhea or blood in the stool.  GU: Denies urgency, frequency, pain with urination, burning sensation, blood in urine, odor or discharge. Musculoskeletal: Denies decrease in range of motion, difficulty with gait, muscle pain or joint pain and swelling.  Skin: Denies redness, rashes, lesions or ulcercations.  Neurological: Denies dizziness, difficulty with memory, difficulty with speech or problems with balance and coordination.  Psych: Pt has a history of anxiety. Denies depression, SI/HI.  No  other specific complaints in a complete review of systems (except as listed in HPI above).  Objective:   Physical Exam  BP 120/78   Pulse 75   Temp 97.9 F (36.6 C) (Oral)   Ht 5' 6.5" (1.689 m)   Wt 193 lb (87.5 kg)   SpO2 99%   BMI 30.68 kg/m   Wt Readings from Last 3 Encounters:  06/30/17 193 lb (87.5 kg)  06/08/17 194 lb (88 kg)  04/05/17 184 lb (83.5 kg)    General: Appears her stated age, obese in NAD. Skin: Warm, dry and intact. No rashes, lesions or ulcerations noted. HEENT: Head: normal shape and size; Eyes: sclera white, no icterus, conjunctiva pink, PERRLA and EOMs intact; Ears: Tm's gray and intact, normal light reflex; Throat/Mouth: Teeth present, mucosa pink and moist, no exudate, lesions or ulcerations noted.  Neck:  Neck supple, trachea midline. No masses, lumps or thyromegaly present.  Cardiovascular: Normal rate and rhythm. S1,S2 noted.  No murmur, rubs or gallops noted. No JVD or BLE edema. No carotid bruits noted. Pulmonary/Chest: Normal effort and positive vesicular breath sounds. No respiratory distress. No wheezes, rales or ronchi noted.  Abdomen: Soft and nontender. Normal bowel sounds. No distention or masses noted. Liver, spleen and kidneys non palpable. Musculoskeletal: Strength 5/5 BUE/BLE. No difficulty with gait.  Neurological: Alert and oriented. Cranial nerves II-XII grossly intact. Coordination normal.  Psychiatric: Mood and affect normal. Behavior is normal. Judgment and thought content normal.     BMET    Component Value Date/Time   NA 137 09/17/2016 1518   K 3.7 12/24/2016 1651   CL 99 09/17/2016 1518   CO2 32 09/17/2016 1518   GLUCOSE 111 (H) 09/17/2016 1518   BUN 9 09/17/2016 1518   CREATININE 0.69 09/17/2016 1518   CALCIUM 9.4 09/17/2016 1518   GFRNONAA >60 05/26/2015 2059   GFRAA >60 05/26/2015 2059    Lipid Panel     Component Value Date/Time   CHOL 136 06/09/2016 1504   TRIG 77.0 06/09/2016 1504   HDL 18.20 (L) 06/09/2016  1610   CHOLHDL 7 06/09/2016 1504   VLDL 15.4 06/09/2016 1504   LDLCALC 102 (H) 06/09/2016 1504    CBC    Component Value Date/Time   WBC 4.4 09/17/2016 1518   RBC 4.66 09/17/2016 1518   HGB 12.6 09/17/2016 1518   HCT 38.8 09/17/2016 1518   PLT 239.0 09/17/2016 1518   MCV 83.2 09/17/2016 1518   MCH 31.6 05/26/2015 2059   MCHC 32.5 09/17/2016 1518   RDW 16.3 (H) 09/17/2016 1518   LYMPHSABS 1.1 07/28/2016 1232   MONOABS 0.4 07/28/2016 1232   EOSABS 0.0 07/28/2016 1232   BASOSABS 0.0 07/28/2016 1232    Hgb A1C Lab Results  Component Value Date   HGBA1C 5.9 06/09/2016  Assessment & Plan:   Preventative Health Maintenance:  Flu and tetanus UTD Pap smear UTD Mammogram ordered- she will call [facility] to schedule She will contact her insurance about the Cologuard Encouraged her to consume a balanced diet and exercise regimen Advised her to see an eye doctor and dentist annually Will check CBC, CMET, Lipid and Vit D today  RTC in 1 year, sooner if needed Nicki ReaperBAITY, Leshaun Biebel, NP

## 2017-06-30 NOTE — Patient Instructions (Signed)

## 2017-07-01 ENCOUNTER — Telehealth: Payer: Self-pay | Admitting: Internal Medicine

## 2017-07-01 ENCOUNTER — Encounter: Payer: Self-pay | Admitting: Internal Medicine

## 2017-07-01 ENCOUNTER — Other Ambulatory Visit (INDEPENDENT_AMBULATORY_CARE_PROVIDER_SITE_OTHER): Payer: BLUE CROSS/BLUE SHIELD

## 2017-07-01 ENCOUNTER — Other Ambulatory Visit: Payer: Self-pay | Admitting: Internal Medicine

## 2017-07-01 DIAGNOSIS — R748 Abnormal levels of other serum enzymes: Secondary | ICD-10-CM

## 2017-07-01 MED ORDER — VITAMIN D (ERGOCALCIFEROL) 1.25 MG (50000 UNIT) PO CAPS: 50000.0000 [IU] | ORAL_CAPSULE | ORAL | 0 refills | Status: DC

## 2017-07-01 NOTE — Assessment & Plan Note (Signed)
Controlled on Omeprazole Discussed avoiding foods that trigger her reflux Encouraged weight loss and discussed how this could help improve her symptoms

## 2017-07-01 NOTE — Assessment & Plan Note (Signed)
Intermittent Clonazepam use If becomes more frequent, will need to discuss alternative methods Support offered today

## 2017-07-01 NOTE — Telephone Encounter (Signed)
Pt requesting a phone call.

## 2017-07-01 NOTE — Assessment & Plan Note (Signed)
Controlled on HCTZ CBC and CMET today Discussed DASH diet and exercise for weight loss

## 2017-07-02 LAB — COMPREHENSIVE METABOLIC PANEL
ALK PHOS: 124 U/L — AB (ref 39–117)
ALT: 539 U/L — ABNORMAL HIGH (ref 0–35)
AST: 334 U/L — ABNORMAL HIGH (ref 0–37)
Albumin: 3.8 g/dL (ref 3.5–5.2)
BUN: 13 mg/dL (ref 6–23)
CHLORIDE: 96 meq/L (ref 96–112)
CO2: 32 mEq/L (ref 19–32)
Calcium: 9.5 mg/dL (ref 8.4–10.5)
Creatinine, Ser: 0.65 mg/dL (ref 0.40–1.20)
GFR: 100.73 mL/min (ref >60.00)
GLUCOSE: 104 mg/dL — AB (ref 70–99)
POTASSIUM: 3.8 meq/L (ref 3.5–5.1)
Sodium: 139 mEq/L (ref 135–145)
TOTAL PROTEIN: 6.4 g/dL (ref 6.0–8.3)
Total Bilirubin: 1.6 mg/dL — ABNORMAL HIGH (ref 0.2–1.2)

## 2017-07-02 LAB — MONONUCLEOSIS SCREEN: Mono Screen: NEGATIVE

## 2017-07-02 NOTE — Telephone Encounter (Signed)
Copied from CRM [id number]. Topic: Inquiry >> Jul 02, 2017 12:28 PM Landry MellowFoltz, Melissa J wrote: Reason for CRM: pt would like  call back about her recent labs. Please call [phone]

## 2017-07-02 NOTE — Telephone Encounter (Signed)
I will call pt when labs come back... They are not back yet, she was here yesterday afternoon

## 2017-07-05 ENCOUNTER — Other Ambulatory Visit: Payer: Self-pay | Admitting: Internal Medicine

## 2017-07-05 ENCOUNTER — Ambulatory Visit (INDEPENDENT_AMBULATORY_CARE_PROVIDER_SITE_OTHER): Payer: BLUE CROSS/BLUE SHIELD | Admitting: Primary Care

## 2017-07-05 ENCOUNTER — Ambulatory Visit: Payer: BLUE CROSS/BLUE SHIELD | Admitting: Internal Medicine

## 2017-07-05 VITALS — BP 122/80 | HR 71 | Temp 97.9°F | Ht 66.5 in | Wt 194.0 lb

## 2017-07-05 DIAGNOSIS — B169 Acute hepatitis B without delta-agent and without hepatic coma: Secondary | ICD-10-CM

## 2017-07-05 LAB — HEPATITIS PANEL, ACUTE
HEP A IGM: NONREACTIVE
HEP B C IGM: REACTIVE — AB
HEP B S AG: REACTIVE — AB
HEP C AB: NONREACTIVE
SIGNAL TO CUT-OFF: 0.02 (ref <1.00)

## 2017-07-05 LAB — CMV ABS, IGG+IGM (CYTOMEGALOVIRUS)
CMV IgM: 112 AU/mL — ABNORMAL HIGH
Cytomegalovirus Ab-IgG: 4.7 U/mL — ABNORMAL HIGH

## 2017-07-05 NOTE — Patient Instructions (Signed)
Stop by the lab prior to leaving today. I will notify you of your results once received.   Stop by the front desk and speak with either Shirlee LimerickMarion or Zella BallRobin regarding your referral to the Hepatology Clinic.   It was a pleasure meeting you!

## 2017-07-05 NOTE — Telephone Encounter (Signed)
Prilosec was filled 04/27/17 w/ 6 refills but HCTZ has been refilled

## 2017-07-05 NOTE — Assessment & Plan Note (Signed)
Recently diagnosed per PCP. Add HIV panel today. CBC stable from last week. Explained diagnosis to patient, she verbalized understanding. Urgent referral to Hepatology/GI placed.

## 2017-07-05 NOTE — Progress Notes (Signed)
Subjective:    Patient ID: Diana [name], female    DOB: 01/07/1961, 57 y.o.   MRN: [id number]  HPI  Diana [name] is a 57 year old female who presents today with a chief complaint of joint aches.   Her joint aches are located to the entire body which began 5 days ago. Her pain began to the left elbow and right thumb and have progressed to the entire body. She denies fevers, chills, cough, sick contacts, unexplained weight loss, abdominal pain, abdominal bloating. She also reports sore throat that began 2 days ago.   She was called by her PCP last week with reports of elevated liver enzymes: ALT of 548 and AST of 337. She then returned for follow up labs including CMV, acute Hepatitis Panel. She's not aware of her lab results as of yet.  Review of Systems  Constitutional: Negative for chills, fatigue and fever.  Respiratory: Negative for shortness of breath.   Cardiovascular: Negative for chest pain.  Gastrointestinal: Negative for abdominal pain and nausea.  Musculoskeletal: Positive for arthralgias.  Skin: Negative for color change.       Past Medical History:  Diagnosis Date  . Allergy   . Anxiety   . Ear infection    recurrent  . Hypertension      Social History   Socioeconomic History  . Marital status: Married    Spouse name: Not on file  . Number of children: 3  . Years of education: Not on file  . Highest education level: Not on file  Social Needs  . Financial resource strain: Not on file  . Food insecurity - worry: Not on file  . Food insecurity - inability: Not on file  . Transportation needs - medical: Not on file  . Transportation needs - non-medical: Not on file  Occupational History  . Occupation: Nutritional therapist: [employer]  Tobacco Use  . Smoking status: Former Smoker    Types: Cigarettes    Last attempt to quit: 04/20/1985    Years since quitting: 32.2  . Smokeless tobacco: Never Used  . Tobacco comment: remote tobacco use     Substance and Sexual Activity  . Alcohol use: No    Alcohol/week: 0.0 oz  . Drug use: No  . Sexual activity: No  Other Topics Concern  . Not on file  Social History Narrative  . Not on file    Past Surgical History:  Procedure Laterality Date  . TONSILLECTOMY    . TUBAL LIGATION     bilateral    Family History  Problem Relation Age of Onset  . Hypertension Mother        alive  . Arthritis Mother   . Lung cancer Father        deceased  . Heart disease Father   . Other Unknown        1 brother and 1 sister both alive an healthy  . Hypertension Maternal Grandmother     No Known Allergies  Current Outpatient Medications on File Prior to Visit  Medication Sig Dispense Refill  . cetirizine (ZYRTEC) 10 MG tablet Take 10 mg by mouth daily.    . clonazePAM (KLONOPIN) 0.5 MG tablet Take 1 tablet (0.5 mg total) by mouth daily as needed. for anxiety 30 tablet 0  . hydrochlorothiazide (MICROZIDE) 12.5 MG capsule Take 1 capsule (12.5 mg total) by mouth daily. MUST SCHEDULE ANNUAL PHYSICAL 30 capsule 1  . omeprazole (PRILOSEC)  20 MG capsule TAKE 1 CAPSULE BY MOUTH DAILY. 30 capsule 6  . potassium chloride (K-DUR) 10 MEQ tablet Take 2 tablets in the morning and 1 tablet in the evening. 90 tablet 2  . Vitamin D, Ergocalciferol, (DRISDOL) 50000 units CAPS capsule Take 1 capsule (50,000 Units total) by mouth every 7 (seven) days. 12 capsule 0   No current facility-administered medications on file prior to visit.     BP 122/80   Pulse 71   Temp 97.9 F (36.6 C) (Oral)   Ht 5' 6.5" (1.689 m)   Wt 194 lb (88 kg)   SpO2 97%   BMI 30.84 kg/m    Objective:   Physical Exam  Constitutional: She appears well-nourished.  HENT:  Mouth/Throat: Oropharynx is clear and moist.  Neck: Neck supple.  Cardiovascular: Normal rate and regular rhythm.  Pulmonary/Chest: Effort normal and breath sounds normal.  Abdominal: Soft. Bowel sounds are normal. There is no hepatosplenomegaly. There is  no tenderness.  Skin: Skin is warm and dry.  No jaundice          Assessment & Plan:

## 2017-07-05 NOTE — Telephone Encounter (Signed)
Patient called and said she needs refill on both medication and send to her pharmacy:, Medicine: hydrochlorothiazide (MICROZIDE) 12.5 MG capsule and omeprazole (PRILOSEC) 20 MG capsule. [place] [place] - [id number]

## 2017-07-06 ENCOUNTER — Telehealth: Payer: Self-pay | Admitting: Internal Medicine

## 2017-07-06 ENCOUNTER — Other Ambulatory Visit
Admission: RE | Admit: 2017-07-06 | Discharge: 2017-07-06 | Disposition: A | Discharge status: Home / Self Care | Payer: BLUE CROSS/BLUE SHIELD | Source: Ambulatory Visit | Attending: Gastroenterology | Admitting: Gastroenterology

## 2017-07-06 ENCOUNTER — Encounter: Payer: Self-pay | Admitting: Gastroenterology

## 2017-07-06 ENCOUNTER — Telehealth: Payer: Self-pay

## 2017-07-06 ENCOUNTER — Ambulatory Visit (INDEPENDENT_AMBULATORY_CARE_PROVIDER_SITE_OTHER): Payer: BLUE CROSS/BLUE SHIELD | Admitting: Gastroenterology

## 2017-07-06 VITALS — BP 146/81 | HR 74 | Ht 66.0 in | Wt 193.0 lb

## 2017-07-06 DIAGNOSIS — B169 Acute hepatitis B without delta-agent and without hepatic coma: Secondary | ICD-10-CM | POA: Diagnosis not present

## 2017-07-06 DIAGNOSIS — E559 Vitamin D deficiency, unspecified: Secondary | ICD-10-CM

## 2017-07-06 LAB — CBC
HEMATOCRIT: 39.5 % (ref 35.0–47.0)
Hemoglobin: 13.1 g/dL (ref 12.0–16.0)
MCH: 29.3 pg (ref 26.0–34.0)
MCHC: 33.2 g/dL (ref 32.0–36.0)
MCV: 88.2 fL (ref 80.0–100.0)
PLATELETS: 209 10*3/uL (ref 150–440)
RBC: 4.47 MIL/uL (ref 3.80–5.20)
RDW: 15.7 % — AB (ref 11.5–14.5)
WBC: 3.6 10*3/uL (ref 3.6–11.0)

## 2017-07-06 LAB — HEPATIC FUNCTION PANEL
ALBUMIN: 3.7 g/dL (ref 3.5–5.0)
ALK PHOS: 138 U/L — AB (ref 38–126)
ALT: 470 U/L — ABNORMAL HIGH (ref 14–54)
AST: 280 U/L — AB (ref 15–41)
BILIRUBIN TOTAL: 2 mg/dL — AB (ref 0.3–1.2)
Bilirubin, Direct: 0.3 mg/dL (ref 0.1–0.5)
Indirect Bilirubin: 1.7 mg/dL — ABNORMAL HIGH (ref 0.3–0.9)
Total Protein: 7.3 g/dL (ref 6.5–8.1)

## 2017-07-06 LAB — PROTIME-INR
INR: 1.04
PROTHROMBIN TIME: 13.5 s (ref 11.4–15.2)

## 2017-07-06 LAB — HIV ANTIBODY (ROUTINE TESTING W REFLEX): HIV 1&2 Ab, 4th Generation: NONREACTIVE

## 2017-07-06 NOTE — Telephone Encounter (Signed)
Copied from CRM [phone]. Topic: Quick Communication - See Telephone Encounter >> Jul 06, 2017  2:19 PM Arlyss Gandyichardson, Desire Fulp N, NT wrote: CRM for notification. See Telephone encounter for: Pt would like a call to see why vitamin D was sent to the pharmacy. She was never told it was being sent. Please advise.   07/06/17.

## 2017-07-06 NOTE — Telephone Encounter (Signed)
LVM for pt informing her of her ultasound appt.  [facility]  Arrival time at 9:15am.  Nothing to eat or drink after midnight.  Thanks [employer]

## 2017-07-06 NOTE — Progress Notes (Signed)
Diana Repress, MD [address]  [address]  [place], [place] [address]  Main: [phone]  Fax: [phone]    Gastroenterology Consultation  Referring Provider:     Doreene Nest, NP Primary Care Physician:  [name], NP Primary Gastroenterologist:  Dr. Arlyss [name] Reason for Consultation:     Hepatitis B infection        HPI:   Diana [name] is a 57 y.o. female referred by Dr. Sampson [name], Salvadore Oxford, NP  for consultation & management of recent diagnosis of hepatitis B infection. Patient who is otherwise healthy, started experiencing intense itching about 2-3 months ago, lately she has been experiencing severe arthralgias of small and large joints. She went to her primary care doctor who checked LFTs which came back elevated predominantly transaminases, mildly elevated alkaline phosphatase and total bilirubin. This was on 07/01/2017. She had repeat LFTs which are persistently elevated. Her LFTs were last normal in 08/2016. She then underwent acute viral hepatitis panel which revealed hepatitis B surface antigen positive, hepatitis B core IgM positive. Hepatitis A antibody negative, hepatitis C antibody negative, HIV negative. She also had elevated CMV IgM antibodies. Patient reports that she has been in relationship with a 50 year old man for the last 6 months or so and following unprotected sex. She tells me that he has liver cancer and not known to have hepatitis B infection. She said, her partner is getting retested for hepatitis B after she was found to have hepatitis B. Her partner's aunt also has hepatitis B. Patient denies having blood transfusions in the past, IV drug abuse, was not active in [employer].  NSAIDs: she has been taking Advil and Aleve secondary to arthralgias, 4 pills daily  Antiplts/Anticoagulants/Anti thrombotics: none  GI Procedures: none  Past Medical History:  Diagnosis Date  . Allergy   . Anxiety   . Ear infection    recurrent  .  Hypertension     Past Surgical History:  Procedure Laterality Date  . TONSILLECTOMY    . TUBAL LIGATION     bilateral     Current Outpatient Medications:  .  hydrochlorothiazide (MICROZIDE) 12.5 MG capsule, Take 1 capsule (12.5 mg total) by mouth daily., Disp: 30 capsule, Rfl: 10 .  omeprazole (PRILOSEC) 20 MG capsule, TAKE 1 CAPSULE BY MOUTH DAILY., Disp: 30 capsule, Rfl: 6 .  potassium chloride (K-DUR) 10 MEQ tablet, Take 2 tablets in the morning and 1 tablet in the evening., Disp: 90 tablet, Rfl: 2 .  cetirizine (ZYRTEC) 10 MG tablet, Take 10 mg by mouth daily., Disp: , Rfl:  .  clonazePAM (KLONOPIN) 0.5 MG tablet, Take 1 tablet (0.5 mg total) by mouth daily as needed. for anxiety (Patient not taking: Reported on 07/06/2017), Disp: 30 tablet, Rfl: 0 .  Vitamin D, Ergocalciferol, (DRISDOL) 50000 units CAPS capsule, Take 1 capsule (50,000 Units total) by mouth every 7 (seven) days. (Patient not taking: Reported on 07/06/2017), Disp: 12 capsule, Rfl: 0   Family History  Problem Relation Age of Onset  . Hypertension Mother        alive  . Arthritis Mother   . Lung cancer Father        deceased  . Heart disease Father   . Other Unknown        1 brother and 1 sister both alive an healthy  . Hypertension Maternal Grandmother      Social History   Tobacco Use  . Smoking status: Former Smoker  Types: Cigarettes    Last attempt to quit: 04/20/1985    Years since quitting: 32.2  . Smokeless tobacco: Never Used  . Tobacco comment: remote tobacco use   Substance Use Topics  . Alcohol use: No    Alcohol/week: 0.0 oz  . Drug use: No    Allergies as of 07/06/2017  . (No Known Allergies)    Review of Systems:    All systems reviewed and negative except where noted in HPI.   Physical Exam:  BP (!) 146/81   Pulse 74   Ht 5\' 6"  (1.676 m)   Wt 193 lb (87.5 kg)   BMI 31.15 kg/m  No LMP recorded. Patient is not currently having periods (Reason: Perimenopausal).  General:    Alert,  Well-developed, well-nourished, pleasant and cooperative in NAD Head:  Normocephalic and atraumatic. Eyes:  Sclera clear, no icterus.   Conjunctiva pink. Ears:  Normal auditory acuity. Nose:  No deformity, discharge, or lesions. Mouth:  No deformity or lesions,oropharynx pink & moist. Neck:  Supple; no masses or thyromegaly. Lungs:  Respirations even and unlabored.  Clear throughout to auscultation.   No wheezes, crackles, or rhonchi. No acute distress. Heart:  Regular rate and rhythm; no murmurs, clicks, rubs, or gallops. Abdomen:  Normal bowel sounds. Soft, non-tender and non-distended without masses, hepatosplenomegaly or hernias noted.  No guarding or rebound tenderness.   Rectal: Not performed Msk:  Symmetrical without gross deformities. Good, equal movement & strength bilaterally. Pulses:  Normal pulses noted. Extremities:  No clubbing or edema.  No cyanosis. Neurologic:  Alert and oriented x3;  grossly normal neurologically. Skin:  Intact without significant lesions or rashes. No jaundice. Lymph Nodes:  No significant cervical adenopathy. Psych:  Alert and cooperative. Normal mood and affect.  Imaging Studies: Last ultrasound abdomen in 05/2015 which was unremarkable  Assessment and Plan:   Diana [name] is a 57 y.o. female with obesity, hypertension, GERD with recent diagnosis of acute hepatitis B infection, found to have elevated LFTs. Her hepatitis B surface antigen positive, hepatitis B core antibody IgM positive. She is probably in the phase of recovery, seroconversion. She does not have clinical signs or symptoms to suggest acute liver failure or fulminant hepatic failure. She is probably at the tail end of acute hepatitis B infection  - Recheck LFTs, PT INR, CBC - Check hepatitis E antigen, E antibody, surface antibody, core antibody total, hepatitis B DNA viral load, hepatitis B antigen - right upper quadrant ultrasound - discussed with her about the signs and  symptoms of acute liver failure - limit intake of NSAIDs and Tylenol use for arthralgias  Follow up in 4 weeks   Diana Repressohini R Vanga, MD

## 2017-07-07 ENCOUNTER — Telehealth: Payer: Self-pay | Admitting: Gastroenterology

## 2017-07-07 ENCOUNTER — Other Ambulatory Visit
Admission: RE | Admit: 2017-07-07 | Discharge: 2017-07-07 | Disposition: A | Discharge status: Home / Self Care | Payer: BLUE CROSS/BLUE SHIELD | Source: Ambulatory Visit | Attending: Gastroenterology | Admitting: Gastroenterology

## 2017-07-07 DIAGNOSIS — B181 Chronic viral hepatitis B without delta-agent: Secondary | ICD-10-CM

## 2017-07-07 DIAGNOSIS — E559 Vitamin D deficiency, unspecified: Secondary | ICD-10-CM | POA: Diagnosis not present

## 2017-07-07 LAB — HEPATITIS B SURFACE ANTIBODY,QUALITATIVE: Hep B S Ab: NONREACTIVE

## 2017-07-07 LAB — HEPATITIS B DNA, ULTRAQUANTITATIVE, PCR

## 2017-07-07 LAB — FERRITIN: FERRITIN: 29 ng/mL (ref 11–307)

## 2017-07-07 LAB — HEPATITIS B CORE ANTIBODY, TOTAL: HEP B C TOTAL AB: POSITIVE — AB

## 2017-07-07 LAB — HEPATITIS B E ANTIGEN: Hep B E Ag: POSITIVE — AB

## 2017-07-07 LAB — HBV REAL-TIME PCR, QUANT
HBV AS IU/ML: 37300000 IU/mL
LOG10 HBV AS IU/ML: 7.572 {Log_IU}/mL

## 2017-07-07 NOTE — Telephone Encounter (Signed)
Hepatitis B surface antigen positive, surface antibody negative, E antigen positive, E antibody negative, core antibody total positive, Viral load pending. By definition she has E antigen positive chronic hepatitis B infection  Recommend liver biopsy Complete other secondary liver disease workup  Patient expressed understanding of the plan Will initiate treatment after the above results  Arlyss Repressohini R Lockie Bothun, MD [address]  [address]  [place], [place] [address]  Main: [phone]  Fax: [phone] Pager: [phone]

## 2017-07-07 NOTE — Telephone Encounter (Signed)
Pt is aware as instructed 

## 2017-07-08 LAB — AFP TUMOR MARKER: AFP, Serum, Tumor Marker: 8.3 ng/mL (ref 0.0–8.3)

## 2017-07-08 LAB — ALPHA-1-ANTITRYPSIN: A-1 Antitrypsin, Ser: 171 mg/dL (ref 90–200)

## 2017-07-08 LAB — ANTI-SMOOTH MUSCLE ANTIBODY, IGG: F-ACTIN AB IGG: 30 U — AB (ref 0–19)

## 2017-07-08 LAB — CERULOPLASMIN: CERULOPLASMIN: 34 mg/dL (ref 19.0–39.0)

## 2017-07-08 LAB — MITOCHONDRIAL ANTIBODIES

## 2017-07-08 LAB — ANTINUCLEAR ANTIBODIES, IFA: ANA Ab, IFA: NEGATIVE

## 2017-07-08 LAB — HEPATITIS B E ANTIBODY: HEP B E AB: NEGATIVE

## 2017-07-08 LAB — ANTI-MICROSOMAL ANTIBODY LIVER / KIDNEY: LKM1 Ab: 2.5 Units (ref 0.0–20.0)

## 2017-07-08 LAB — VITAMIN D 25 HYDROXY (VIT D DEFICIENCY, FRACTURES): VIT D 25 HYDROXY: 17.4 ng/mL — AB (ref 30.0–100.0)

## 2017-07-13 ENCOUNTER — Telehealth: Payer: Self-pay | Admitting: Gastroenterology

## 2017-07-13 LAB — MISC LABCORP TEST (SEND OUT): Labcorp test code: 823416

## 2017-07-13 NOTE — Telephone Encounter (Signed)
Patient LVM and wants to know if this is ultrasound with biopsy. She also wants her lab results.  Her ultrasound is scheduled for 07/14/17.

## 2017-07-14 ENCOUNTER — Ambulatory Visit
Admission: RE | Admit: 2017-07-14 | Discharge: 2017-07-14 | Disposition: A | Discharge status: Home / Self Care | Payer: BLUE CROSS/BLUE SHIELD | Source: Ambulatory Visit | Attending: Gastroenterology | Admitting: Gastroenterology

## 2017-07-14 DIAGNOSIS — B169 Acute hepatitis B without delta-agent and without hepatic coma: Secondary | ICD-10-CM | POA: Diagnosis not present

## 2017-07-19 ENCOUNTER — Other Ambulatory Visit: Payer: Self-pay

## 2017-07-19 ENCOUNTER — Ambulatory Visit (INDEPENDENT_AMBULATORY_CARE_PROVIDER_SITE_OTHER): Payer: BLUE CROSS/BLUE SHIELD | Admitting: Gastroenterology

## 2017-07-19 VITALS — BP 129/84 | HR 82 | Ht 66.0 in | Wt 188.8 lb

## 2017-07-19 DIAGNOSIS — B181 Chronic viral hepatitis B without delta-agent: Secondary | ICD-10-CM

## 2017-07-19 NOTE — Progress Notes (Signed)
Diana Repressohini R Kayelee Herbig, Diana [name] [address]  [address]  [place], [place] [address]  Main: [phone]  Fax: [phone]    Gastroenterology Consultation  Referring Provider:     Lorre MunroeBaity, Regina W, NP Primary Care Physician:  Lorre MunroeBaity, Regina W, NP Primary Gastroenterologist:  Dr. Arlyss Repressohini R Xaidyn Kepner Reason for Consultation:  Chronic hepatitis B infection        HPI:   Diana [name] is a 57 y.o. female referred by Dr. Sampson SiBaity, Salvadore Oxfordegina W, NP  for consultation & management of recent diagnosis of hepatitis B infection. Patient who is otherwise healthy, started experiencing intense itching about 2-3 months ago, lately she has been experiencing severe arthralgias of small and large joints. She went to her primary care doctor who checked LFTs which came back elevated predominantly transaminases, mildly elevated alkaline phosphatase and total bilirubin. This was on 07/01/2017. She had repeat LFTs which are persistently elevated. Her LFTs were last normal in 08/2016. She then underwent acute viral hepatitis panel which revealed hepatitis B surface antigen positive, hepatitis B core IgM positive. Hepatitis A antibody negative, hepatitis C antibody negative, HIV negative. She also had elevated CMV IgM antibodies. Patient reports that she has been in relationship with a 55 year old man for the last 6 months or so and following unprotected sex. She tells me that he has liver cancer and not known to have hepatitis B infection. She said, her partner is getting retested for hepatitis B after she was found to have hepatitis B. Her partner's aunt also has hepatitis B. Patient denies having blood transfusions in the past, IV drug abuse, was not active in [employer].  Follow up visit 07/19/17: She reports feeling better overall. Arthralgias are improving. She had further tests which revealed positive hepatitis B e antigen, negative e antibody, high viral load. Ultrasound liver negative for cirrhosis or liver lesions. She had  secondary liver disease workup which revealed weakly positive anti-smooth muscle antibodies. She tells me that her female partner was recently tested again for hepatitis B and came back negative and he is getting immunized against hepatitis B. She is perplexed how she contracted hepatitis B and unable to figure out how she got it.   NSAIDs: she has been taking Advil and Aleve secondary to arthralgias, 4 pills daily  Antiplts/Anticoagulants/Anti thrombotics: none  GI Procedures: none  Past Medical History:  Diagnosis Date  . Allergy   . Anxiety   . Ear infection    recurrent  . Hypertension     Past Surgical History:  Procedure Laterality Date  . TONSILLECTOMY    . TUBAL LIGATION     bilateral     Current Outpatient Medications:  .  cetirizine (ZYRTEC) 10 MG tablet, Take 10 mg by mouth daily., Disp: , Rfl:  .  hydrochlorothiazide (MICROZIDE) 12.5 MG capsule, Take 1 capsule (12.5 mg total) by mouth daily., Disp: 30 capsule, Rfl: 10 .  omeprazole (PRILOSEC) 20 MG capsule, TAKE 1 CAPSULE BY MOUTH DAILY., Disp: 30 capsule, Rfl: 6 .  potassium chloride (K-DUR) 10 MEQ tablet, Take 2 tablets in the morning and 1 tablet in the evening., Disp: 90 tablet, Rfl: 2 .  clonazePAM (KLONOPIN) 0.5 MG tablet, Take 1 tablet (0.5 mg total) by mouth daily as needed. for anxiety (Patient not taking: Reported on 07/06/2017), Disp: 30 tablet, Rfl: 0 .  Vitamin D, Ergocalciferol, (DRISDOL) 50000 units CAPS capsule, Take 1 capsule (50,000 Units total) by mouth every 7 (seven) days. (Patient not taking: Reported on 07/06/2017), Disp:  12 capsule, Rfl: 0   Family History  Problem Relation Age of Onset  . Hypertension Mother        alive  . Arthritis Mother   . Lung cancer Father        deceased  . Heart disease Father   . Other Unknown        1 brother and 1 sister both alive an healthy  . Hypertension Maternal Grandmother      Social History   Tobacco Use  . Smoking status: Former Smoker    Types:  Cigarettes    Last attempt to quit: 04/20/1985    Years since quitting: 32.2  . Smokeless tobacco: Never Used  . Tobacco comment: remote tobacco use   Substance Use Topics  . Alcohol use: No    Alcohol/week: 0.0 oz  . Drug use: No    Allergies as of 07/19/2017  . (No Known Allergies)    Review of Systems:    All systems reviewed and negative except where noted in HPI.   Physical Exam:  BP 129/84   Pulse 82   Ht 5\' 6"  (1.676 m)   Wt 188 lb 12.8 oz (85.6 kg)   BMI 30.47 kg/m  No LMP recorded. (Menstrual status: Perimenopausal).  General:   Alert,  Well-developed, well-nourished, pleasant and cooperative in NAD Head:  Normocephalic and atraumatic. Eyes:  Sclera clear, no icterus.   Conjunctiva pink. Ears:  Normal auditory acuity. Nose:  No deformity, discharge, or lesions. Mouth:  No deformity or lesions,oropharynx pink & moist. Neck:  Supple; no masses or thyromegaly. Lungs:  Respirations even and unlabored.  Clear throughout to auscultation.   No wheezes, crackles, or rhonchi. No acute distress. Heart:  Regular rate and rhythm; no murmurs, clicks, rubs, or gallops. Abdomen:  Normal bowel sounds. Soft, non-tender and non-distended without masses, hepatosplenomegaly or hernias noted.  No guarding or rebound tenderness.   Rectal: Not performed Msk:  Symmetrical without gross deformities. Good, equal movement & strength bilaterally. Pulses:  Normal pulses noted. Extremities:  No clubbing or edema.  No cyanosis. Neurologic:  Alert and oriented x3;  grossly normal neurologically. Skin:  Intact without significant lesions or rashes. No jaundice. Lymph Nodes:  No significant cervical adenopathy. Psych:  Alert and cooperative. Normal mood and affect.  Imaging Studies: Last ultrasound abdomen in 05/2015 which was unremarkable  Ultrasound abdomen 06/2017 unremarkable with no evidence of cirrhosis and no portal hypertension  Assessment and Plan:   Diana [name] is a 57 y.o.  female with hypertension, GERD with recent diagnosis of acute hepatitis B infection, found to have elevated LFTs. Her hepatitis B surface antigen positive, hepatitis B core antibody IgM positive. She has hepatitis E antigen positive, E antibody negative, high viral load. This means she has chronic hepatitis B infection. She does not have clinical signs or symptoms to suggest chronic liver disease. There is no evidence of portal hypertension. Secondary liver disease workup revealed weakly positive anti-smooth muscle antibodies. She does not have hep C  - pending ultrasound-guided liver biopsy, discussed with her about the implications of liver biopsy results to determine the treatment - If there is no evidence of alternating etiology such as autoimmune hepatitis on liver biopsy, we will treat her chronic hepatitis B infection with entecavir or tenofovir - limit intake of NSAIDs and Tylenol use for arthralgias  Follow up after the liver biopsy results   Diana Repress, Diana [name]

## 2017-07-26 ENCOUNTER — Other Ambulatory Visit: Payer: Self-pay | Admitting: Radiology

## 2017-07-27 ENCOUNTER — Ambulatory Visit
Admission: RE | Admit: 2017-07-27 | Discharge: 2017-07-27 | Disposition: A | Discharge status: Home / Self Care | Payer: BLUE CROSS/BLUE SHIELD | Source: Ambulatory Visit | Attending: Gastroenterology | Admitting: Gastroenterology

## 2017-07-27 ENCOUNTER — Other Ambulatory Visit: Payer: Self-pay

## 2017-07-27 ENCOUNTER — Telehealth: Payer: Self-pay

## 2017-07-27 ENCOUNTER — Encounter: Payer: Self-pay | Admitting: Emergency Medicine

## 2017-07-27 DIAGNOSIS — B181 Chronic viral hepatitis B without delta-agent: Secondary | ICD-10-CM | POA: Insufficient documentation

## 2017-07-27 DIAGNOSIS — I10 Essential (primary) hypertension: Secondary | ICD-10-CM | POA: Insufficient documentation

## 2017-07-27 DIAGNOSIS — Z87891 Personal history of nicotine dependence: Secondary | ICD-10-CM | POA: Diagnosis not present

## 2017-07-27 DIAGNOSIS — R945 Abnormal results of liver function studies: Secondary | ICD-10-CM

## 2017-07-27 DIAGNOSIS — G8918 Other acute postprocedural pain: Secondary | ICD-10-CM | POA: Insufficient documentation

## 2017-07-27 DIAGNOSIS — Z79899 Other long term (current) drug therapy: Secondary | ICD-10-CM | POA: Diagnosis not present

## 2017-07-27 DIAGNOSIS — B1910 Unspecified viral hepatitis B without hepatic coma: Secondary | ICD-10-CM | POA: Diagnosis not present

## 2017-07-27 DIAGNOSIS — R1011 Right upper quadrant pain: Secondary | ICD-10-CM | POA: Diagnosis not present

## 2017-07-27 LAB — URINALYSIS, COMPLETE (UACMP) WITH MICROSCOPIC
Bacteria, UA: NONE SEEN
Bilirubin Urine: NEGATIVE
GLUCOSE, UA: NEGATIVE mg/dL
Hgb urine dipstick: NEGATIVE
KETONES UR: NEGATIVE mg/dL
Leukocytes, UA: NEGATIVE
Nitrite: NEGATIVE
PH: 5 (ref 5.0–8.0)
PROTEIN: NEGATIVE mg/dL
Specific Gravity, Urine: 1.021 (ref 1.005–1.030)

## 2017-07-27 LAB — COMPREHENSIVE METABOLIC PANEL
ALK PHOS: 157 U/L — AB (ref 38–126)
ALT: 296 U/L — AB (ref 14–54)
AST: 238 U/L — AB (ref 15–41)
Albumin: 3.7 g/dL (ref 3.5–5.0)
Anion gap: 6 (ref 5–15)
BILIRUBIN TOTAL: 1.5 mg/dL — AB (ref 0.3–1.2)
BUN: 14 mg/dL (ref 6–20)
CALCIUM: 8.7 mg/dL — AB (ref 8.9–10.3)
CO2: 29 mmol/L (ref 22–32)
CREATININE: 0.57 mg/dL (ref 0.44–1.00)
Chloride: 103 mmol/L (ref 101–111)
Glucose, Bld: 120 mg/dL — ABNORMAL HIGH (ref 65–99)
Potassium: 3.5 mmol/L (ref 3.5–5.1)
Sodium: 138 mmol/L (ref 135–145)
Total Protein: 7 g/dL (ref 6.5–8.1)

## 2017-07-27 LAB — CBC
HEMATOCRIT: 40 % (ref 35.0–47.0)
Hemoglobin: 13.7 g/dL (ref 12.0–16.0)
MCH: 29.7 pg (ref 26.0–34.0)
MCHC: 34.2 g/dL (ref 32.0–36.0)
MCV: 86.9 fL (ref 80.0–100.0)
Platelets: 191 10*3/uL (ref 150–440)
RBC: 4.6 MIL/uL (ref 3.80–5.20)
RDW: 14.8 % — AB (ref 11.5–14.5)
WBC: 3.5 10*3/uL — ABNORMAL LOW (ref 3.6–11.0)

## 2017-07-27 LAB — PROTIME-INR
INR: 1
Prothrombin Time: 13.1 seconds (ref 11.4–15.2)

## 2017-07-27 LAB — LIPASE, BLOOD: LIPASE: 38 U/L (ref 11–51)

## 2017-07-27 MED ORDER — MIDAZOLAM HCL 5 MG/5ML IJ SOLN
INTRAMUSCULAR | Status: AC
  Filled 2017-07-27: qty 5

## 2017-07-27 MED ORDER — FENTANYL CITRATE (PF) 100 MCG/2ML IJ SOLN
INTRAMUSCULAR | Status: AC
  Filled 2017-07-27: qty 2

## 2017-07-27 MED ORDER — SODIUM CHLORIDE 0.9 % IV SOLN
INTRAVENOUS | Status: DC
  Administered 2017-07-27: 11:00:00 via INTRAVENOUS

## 2017-07-27 MED ORDER — FENTANYL CITRATE (PF) 100 MCG/2ML IJ SOLN
INTRAMUSCULAR | Status: AC | PRN
  Administered 2017-07-27 (×2): 50 ug via INTRAVENOUS

## 2017-07-27 MED ORDER — MIDAZOLAM HCL 5 MG/5ML IJ SOLN
INTRAMUSCULAR | Status: AC | PRN
  Administered 2017-07-27 (×2): 1 mg via INTRAVENOUS

## 2017-07-27 NOTE — Progress Notes (Signed)
PA-C Toniann FailWendy in to speak with pt. Re: procedure. Lab tech. In now also to draw labs for pt. Pt."nervous"- PA answered all questions for pt.

## 2017-07-27 NOTE — OR Nursing (Signed)
Up to bathroom to void after OKed by Dr Grace IsaacWatts.

## 2017-07-27 NOTE — ED Notes (Addendum)
No imaging or ultrasound to be done as protoccols at this time per Dr. Gerald LeitzSiadeckie.

## 2017-07-27 NOTE — Sedation Documentation (Signed)
Total sedation: Versed 2 mg IV, Fentanyl 100 mcg IV. Pt. Tolerated procedure well.  

## 2017-07-27 NOTE — H&P (Signed)
Chief Complaint: Elevated liver function test  Referring Physician(s): Toney ReilVanga,Rohini Reddy  Supervising Physician: Simonne ComeWatts, John  Patient Status: [facility] - Out-pt  History of Present Illness: Diana [name] is a 57 y.o. female with a recent diagnosis of hepatitis B infection.   She started experiencing intense itching about 2-3 months ago, lately she has been experiencing severe arthralgias of small and large joints.   She went to her primary care doctor who checked LFTs which came back elevated predominantly transaminases, mildly elevated alkaline phosphatase and total bilirubin.   Acute viral hepatitis panel which revealed hepatitis B surface antigen positive, hepatitis B core IgM positive. Hepatitis A antibody negative, hepatitis C antibody negative, HIV negative.   She is here today for a random liver biopsy.  She is NPO. No blood thinners.   Past Medical History:  Diagnosis Date  . Allergy   . Anxiety   . Ear infection    recurrent  . Hypertension     Past Surgical History:  Procedure Laterality Date  . TONSILLECTOMY    . TUBAL LIGATION     bilateral    Allergies: Patient has no known allergies.  Medications: Prior to Admission medications   Medication Sig Start Date End Date Taking? Authorizing Provider  cetirizine (ZYRTEC) 10 MG tablet Take 10 mg by mouth daily.    [provider]  clonazePAM (KLONOPIN) 0.5 MG tablet Take 1 tablet (0.5 mg total) by mouth daily as needed. for anxiety Patient not taking: Reported on 07/06/2017 06/01/17   Lorre MunroeBaity, Regina W, NP  hydrochlorothiazide (MICROZIDE) 12.5 MG capsule Take 1 capsule (12.5 mg total) by mouth daily. 07/05/17   Lorre MunroeBaity, Regina W, NP  omeprazole (PRILOSEC) 20 MG capsule TAKE 1 CAPSULE BY MOUTH DAILY. 04/27/17   Lorre MunroeBaity, Regina W, NP  potassium chloride (K-DUR) 10 MEQ tablet Take 2 tablets in the morning and 1 tablet in the evening. 02/25/17   Lorre MunroeBaity, Regina W, NP  Vitamin D, Ergocalciferol, (DRISDOL) 50000  units CAPS capsule Take 1 capsule (50,000 Units total) by mouth every 7 (seven) days. Patient not taking: Reported on 07/06/2017 07/01/17   Lorre MunroeBaity, Regina W, NP     Family History  Problem Relation Age of Onset  . Hypertension Mother        alive  . Arthritis Mother   . Lung cancer Father        deceased  . Heart disease Father   . Other Unknown        1 brother and 1 sister both alive an healthy  . Hypertension Maternal Grandmother     Social History   Socioeconomic History  . Marital status: Married    Spouse name: Not on file  . Number of children: 3  . Years of education: Not on file  . Highest education level: Not on file  Occupational History  . Occupation: Nutritional therapistcustomer service    Employer: [employer]  Social Needs  . Financial resource strain: Not on file  . Food insecurity:    Worry: Not on file    Inability: Not on file  . Transportation needs:    Medical: Not on file    Non-medical: Not on file  Tobacco Use  . Smoking status: Former Smoker    Types: Cigarettes    Last attempt to quit: 04/20/1985    Years since quitting: 32.2  . Smokeless tobacco: Never Used  . Tobacco comment: remote tobacco use   Substance and Sexual Activity  . Alcohol use:  No    Alcohol/week: 0.0 oz  . Drug use: No  . Sexual activity: Never  Lifestyle  . Physical activity:    Days per week: Not on file    Minutes per session: Not on file  . Stress: Not on file  Relationships  . Social connections:    Talks on phone: Not on file    Gets together: Not on file    Attends religious service: Not on file    Active member of club or organization: Not on file    Attends meetings of clubs or organizations: Not on file    Relationship status: Not on file  Other Topics Concern  . Not on file  Social History Narrative  . Not on file   Review of Systems: A 12 point ROS discussed and pertinent positives are indicated in the HPI above.  All other systems are negative. Review of  Systems  Vital Signs: BP 138/85   Pulse 76   Temp 98.1 F (36.7 C) (Oral)   Resp 20   Ht 5\' 6"  (1.676 m)   Wt 188 lb (85.3 kg)   SpO2 99%   BMI 30.34 kg/m   Physical Exam  Constitutional: She is oriented to person, place, and time. She appears well-developed.  HENT:  Head: Normocephalic and atraumatic.  Eyes: EOM are normal.  Neck: Normal range of motion.  Cardiovascular: Normal rate, regular rhythm and normal heart sounds.  Pulmonary/Chest: Effort normal and breath sounds normal. No respiratory distress.  Abdominal: Soft. She exhibits no distension. There is no tenderness.  Musculoskeletal: Normal range of motion.  Neurological: She is alert and oriented to person, place, and time.  Skin: Skin is warm and dry.  Psychiatric: She has a normal mood and affect. Her behavior is normal. Judgment and thought content normal.  Vitals reviewed.   Imaging: US Abdomen Limited Ruq  Result Date: 07/14/2017 CLINICAL DATA:  Acute hepatitis B EXAM: ULTRASOUND ABDOMEN LIMITED RIGHT UPPER QUADRANT COMPARISON:  05/26/2015 FINDINGS: Gallbladder: No gallstones or wall thickening visualized. No sonographic Murphy sign noted by sonographer. Common bile duct: Diameter: 3 mm common normal Liver: No focal lesion identified. Within normal limits in parenchymal echogenicity. Portal vein is patent on color Doppler imaging with normal direction of blood flow towards the liver. IMPRESSION: Normal right upper quadrant ultrasound. Electronically Signed   By: Paulina Fusi M.D.   On: 07/14/2017 14:17    Labs:  CBC: Recent Labs    09/17/16 1518 06/30/17 1453 07/06/17 1203 07/27/17 1023  WBC 4.4 4.0 3.6 3.5*  HGB 12.6 13.5 13.1 13.7  HCT 38.8 39.7 39.5 40.0  PLT 239.0 172.0 209 191    COAGS: Recent Labs    07/06/17 1203  INR 1.04    BMP: Recent Labs    07/31/16 1200  09/17/16 1518 12/24/16 1651 06/30/17 1453 07/01/17 1527  NA 140  --  137  --  138 139  K 3.0*   < > 3.3* 3.7 3.5 3.8  CL  98  --  99  --  97 96  CO2 35*  --  32  --  35* 32  GLUCOSE 129*  --  111*  --  112* 104*  BUN 9  --  9  --  12 13  CALCIUM 9.2  --  9.4  --  9.4 9.5  CREATININE 0.71  --  0.69  --  0.54 0.65   < > = values in this interval not displayed.  LIVER FUNCTION TESTS: Recent Labs    09/17/16 1518 06/30/17 1453 07/01/17 1527 07/06/17 1203  BILITOT 1.5* 0.9 1.6* 2.0*  AST 14 337* 334* 280*  ALT 13 548* 539* 470*  ALKPHOS 52 121* 124* 138*  PROT 6.9 6.7 6.4 7.3  ALBUMIN 4.4 3.9 3.8 3.7    TUMOR MARKERS: No results for input(s): AFPTM, CEA, CA199, CHROMGRNA in the last 8760 hours.  Assessment and Plan:  Hepatitis B  Elevated LFTs.  Will proceed with random liver biopsy today by Dr. Grace Isaac.  Risks and benefits discussed with the patient including, but not limited to bleeding, infection, damage to adjacent structures or low yield requiring additional tests.  All of the patient's questions were answered, patient is agreeable to proceed. Consent signed and in chart.  Thank you for this interesting consult.  I greatly enjoyed meeting Diana [name] and look forward to participating in their care.  A copy of this report was sent to the requesting provider on this date.  Electronically Signed: Gwynneth Macleod, PA-C 07/27/2017, 10:35 AM   I spent a total of  30 Minutes  in face to face in clinical consultation, greater than 50% of which was counseling/coordinating care for liver biopsy.

## 2017-07-27 NOTE — Telephone Encounter (Signed)
Patient contacted office stated she is feeling some discomfort after having her paracentesis.  This discomfort she is experiencing she rates on the painscale 1-10 a 4.  Informed her that she may take a tylenol for pain relief and to go to ER if her pain becomes severe.  Thanks [employer]

## 2017-07-27 NOTE — Procedures (Signed)
Pre Procedure Dx: Elevated LFTs of uncertain etiology Post Procedural Dx: Same  Technically successful US guided biopsy of right lobe of the liver.  EBL: None No immediate complications.   Jay Marcello Tuzzolino, MD Pager #: [phone]    

## 2017-07-27 NOTE — Discharge Instructions (Signed)
Liver Biopsy, Care After  Refer to this sheet in the next few weeks. These instructions provide you with information on caring for yourself after your procedure. Your health care provider may also give you more specific instructions. Your treatment has been planned according to current medical practices, but problems sometimes occur. Call your health care provider if you have any problems or questions after your procedure.  What can I expect after the procedure?  After your procedure, it is typical to have the following:  · A small amount of discomfort in the area where the biopsy was done and in the right shoulder or shoulder blade.  · A small amount of bruising around the area where the biopsy was done and on the skin over the liver.  · Sleepiness and fatigue for the rest of the day.    Follow these instructions at home:  · Rest at home for 1-2 days or as directed by your health care provider.  · Have a friend or family member stay with you for at least 24 hours.  · Because of the medicines used during the procedure, you should not do the following things in the first 24 hours:  ? Drive.  ? Use machinery.  ? Be responsible for the care of other people.  ? Sign legal documents.  ? Take a bath or shower.  · There are many different ways to close and cover an incision, including stitches, skin glue, and adhesive strips. Follow your health care provider's instructions on:  ? Incision care.  ? Bandage (dressing) changes and removal.  ? Incision closure removal.  · Do not drink alcohol in the first week.  · Do not lift more than 5 pounds or play contact sports for 2 weeks after this test.  · Take medicines only as directed by your health care provider. Do not take medicine containing aspirin or non-steroidal anti-inflammatory medicines such as ibuprofen for 1 week after this test.  · It is your responsibility to get your test results.  Contact a health care provider if:  · You have increased bleeding from an incision  that results in more than a small spot of blood.  · You have redness, swelling, or increasing pain in any incisions.  · You notice a discharge or a bad smell coming from any of your incisions.  · You have a fever or chills.  Get help right away if:  · You develop swelling, bloating, or pain in your abdomen.  · You become dizzy or faint.  · You develop a rash.  · You are nauseous or vomit.  · You have difficulty breathing, feel short of breath, or feel faint.  · You develop chest pain.  · You have problems with your speech or vision.  · You have trouble balancing or moving your arms or legs.  This information is not intended to replace advice given to you by your health care provider. Make sure you discuss any questions you have with your health care provider.  Document Released: 10/24/2004 Document Revised: 09/12/2015 Document Reviewed: 06/02/2013  Elsevier Interactive Patient Education © 2018 [employer]

## 2017-07-27 NOTE — ED Triage Notes (Signed)
Pt was seen here today for liver biopsy for hep b and is here for persistent RUQ pain.  Pt sent here for ultrasound to rule out bleeding.

## 2017-07-28 ENCOUNTER — Telehealth: Payer: Self-pay | Admitting: Gastroenterology

## 2017-07-28 ENCOUNTER — Emergency Department: Payer: BLUE CROSS/BLUE SHIELD

## 2017-07-28 ENCOUNTER — Emergency Department
Admission: EM | Admit: 2017-07-28 | Discharge: 2017-07-28 | Disposition: A | Discharge status: Home / Self Care | Payer: BLUE CROSS/BLUE SHIELD | Attending: Emergency Medicine | Admitting: Emergency Medicine

## 2017-07-28 DIAGNOSIS — R1011 Right upper quadrant pain: Secondary | ICD-10-CM | POA: Diagnosis not present

## 2017-07-28 DIAGNOSIS — G8918 Other acute postprocedural pain: Secondary | ICD-10-CM

## 2017-07-28 MED ORDER — OXYCODONE HCL 5 MG PO TABS
10.0000 mg | ORAL_TABLET | Freq: Once | ORAL | Status: AC
  Administered 2017-07-28: 10 mg via ORAL
  Filled 2017-07-28: qty 2

## 2017-07-28 MED ORDER — OXYCODONE-ACETAMINOPHEN 5-325 MG PO TABS: 1.0000 | ORAL_TABLET | ORAL | 0 refills | Status: DC | PRN

## 2017-07-28 NOTE — ED Notes (Signed)
Pt to u/s via w/c accomp by u/s tech 

## 2017-07-28 NOTE — ED Provider Notes (Signed)
[facility] Emergency Department Provider Note   First MD Initiated Contact with Patient 07/28/17 0215     (approximate)  I have reviewed the triage vital signs and the nursing notes.   HISTORY  Chief Complaint Abdominal Pain    HPI Diana [name] is a 57 y.o. female with below list of chronic medical conditions presents to the emergency department with 10 out of 10 right upper quadrant pain status post liver biopsy that was performed yesterday.  Patient states that she was not prescribed anything for pain following the biopsy.  Patient states that pain is worse with any deep inspiration movement or palpation of the area.  Patient denies any fever no chest discomfort.  Patient denies any vomiting.  Past Medical History:  Diagnosis Date  . Allergy   . Anxiety   . Ear infection    recurrent  . Hypertension     Patient Active Problem List   Diagnosis Date Noted  . Acute hepatitis B 07/05/2017  . GERD (gastroesophageal reflux disease) 06/09/2016  . Essential hypertension 03/07/2015  . Generalized anxiety disorder 03/07/2015    Past Surgical History:  Procedure Laterality Date  . TONSILLECTOMY    . TUBAL LIGATION     bilateral    Prior to Admission medications   Medication Sig Start Date End Date Taking? Authorizing Provider  cetirizine (ZYRTEC) 10 MG tablet Take 10 mg by mouth daily.    [provider]  clonazePAM (KLONOPIN) 0.5 MG tablet Take 1 tablet (0.5 mg total) by mouth daily as needed. for anxiety Patient not taking: Reported on 07/06/2017 06/01/17   Lorre MunroeBaity, Regina W, NP  hydrochlorothiazide (MICROZIDE) 12.5 MG capsule Take 1 capsule (12.5 mg total) by mouth daily. 07/05/17   Lorre MunroeBaity, Regina W, NP  omeprazole (PRILOSEC) 20 MG capsule TAKE 1 CAPSULE BY MOUTH DAILY. 04/27/17   Lorre MunroeBaity, Regina W, NP  potassium chloride (K-DUR) 10 MEQ tablet Take 2 tablets in the morning and 1 tablet in the evening. 02/25/17   Lorre MunroeBaity, Regina W, NP  Vitamin D,  Ergocalciferol, (DRISDOL) 50000 units CAPS capsule Take 1 capsule (50,000 Units total) by mouth every 7 (seven) days. Patient not taking: Reported on 07/06/2017 07/01/17   Lorre MunroeBaity, Regina W, NP    Allergies No known drug allergies  Family History  Problem Relation Age of Onset  . Hypertension Mother        alive  . Arthritis Mother   . Lung cancer Father        deceased  . Heart disease Father   . Other Unknown        1 brother and 1 sister both alive an healthy  . Hypertension Maternal Grandmother     Social History Social History   Tobacco Use  . Smoking status: Former Smoker    Types: Cigarettes    Last attempt to quit: 04/20/1985    Years since quitting: 32.2  . Smokeless tobacco: Never Used  . Tobacco comment: remote tobacco use   Substance Use Topics  . Alcohol use: No    Alcohol/week: 0.0 oz  . Drug use: No    Review of Systems Constitutional: No fever/chills Eyes: No visual changes. ENT: No sore throat. Cardiovascular: Denies chest pain. Respiratory: Denies shortness of breath. Gastrointestinal: Positive for abdominal pain.  No nausea, no vomiting.  No diarrhea.  No constipation. Genitourinary: Negative for dysuria. Musculoskeletal: Negative for neck pain.  Negative for back pain. Integumentary: Negative for rash. Neurological: Negative for headaches,  focal weakness or numbness.   ____________________________________________   PHYSICAL EXAM:  VITAL SIGNS: ED Triage Vitals [07/27/17 2147]  Enc Vitals Group     BP 124/70     Pulse Rate 85     Resp 20     Temp 98.2 F (36.8 C)     Temp Source Oral     SpO2 100 %     Weight 86.2 kg (190 lb)     Height 1.676 m (5\' 6" )     Head Circumference      Peak Flow      Pain Score 7     Pain Loc      Pain Edu?      Excl. in GC?     Constitutional: Alert and oriented. Well appearing and in no acute distress. Eyes: Conjunctivae are normal.  Head: Atraumatic. Mouth/Throat: Mucous membranes are moist.   Oropharynx non-erythematous. Neck: No stridor.   Cardiovascular: Normal rate, regular rhythm. Good peripheral circulation. Grossly normal heart sounds. Respiratory: Normal respiratory effort.  No retractions. Lungs CTAB. Gastrointestinal: Right upper quadrant tenderness no distention.  Biopsy site clean dry intact with no evidence of bleeding or infection Musculoskeletal: No lower extremity tenderness nor edema. No gross deformities of extremities. Neurologic:  Normal speech and language. No gross focal neurologic deficits are appreciated.  Skin:  Skin is warm, dry and intact. No rash noted. Psychiatric: Mood and affect are normal. Speech and behavior are normal.  ____________________________________________   LABS (all labs ordered are listed, but only abnormal results are displayed)  Labs Reviewed  COMPREHENSIVE METABOLIC PANEL - Abnormal; Notable for the following components:      Result Value   Glucose, Bld 120 (*)    Calcium 8.7 (*)    AST 238 (*)    ALT 296 (*)    Alkaline Phosphatase 157 (*)    Total Bilirubin 1.5 (*)    All other components within normal limits  URINALYSIS, COMPLETE (UACMP) WITH MICROSCOPIC - Abnormal; Notable for the following components:   Color, Urine YELLOW (*)    APPearance CLEAR (*)    Squamous Epithelial / LPF 0-5 (*)    All other components within normal limits  LIPASE, BLOOD     RADIOLOGY I, Diana [name], personally viewed and evaluated these images (plain radiographs) as part of my medical decision making, as well as reviewing the written report by the radiologist.  ED MD interpretation: Few scattered areas consistent with possible air otherwise unremarkable right upper quadrant ultrasound per radiologist.  Official radiology report(s): US Biopsy (liver)  Result Date: 07/27/2017 INDICATION: History of chronic hepatitis B, now with elevated LFTs. Please perform ultrasound-guided random liver biopsy for tissue diagnostic purposes. EXAM:  ULTRASOUND GUIDED LIVER BIOPSY COMPARISON:  None. MEDICATIONS: None ANESTHESIA/SEDATION: Fentanyl 100 mcg IV; Versed 2 mg IV Total Moderate Sedation time: 15 minutes; The patient was continuously monitored during the procedure by the interventional radiology nurse under my direct supervision. COMPLICATIONS: None immediate. PROCEDURE: Informed written consent was obtained from the patient after a discussion of the risks, benefits and alternatives to treatment. The patient understands and consents the procedure. A timeout was performed prior to the initiation of the procedure. Ultrasound scanning was performed of the right upper abdominal quadrant and the procedure was planned. The right upper abdomen was prepped and draped in the usual sterile fashion. The overlying soft tissues were anesthetized with 1% lidocaine with epinephrine. A 17 gauge, 6.8 cm co-axial needle was advanced into a peripheral aspect  of the right lobe of the liver and 3 core biopsies were obtained with an 18 gauge core device under direct ultrasound guidance. The co-axial needle track was embolized with the administration of a Gel-Foam slurry. Superficial hemostasis was obtained with manual compression. Post procedural scanning was negative for definitive area of hemorrhage. A dressing was placed. The patient tolerated the procedure well without immediate post procedural complication. IMPRESSION: Technically successful ultrasound guided liver biopsy. Electronically Signed   By: Simonne Come M.D.   On: 07/27/2017 13:53   US Abdomen Limited Ruq  Result Date: 07/28/2017 CLINICAL DATA:  Initial evaluation for acute right upper quadrant pain, status post recent liver biopsy. EXAM: ULTRASOUND ABDOMEN LIMITED RIGHT UPPER QUADRANT COMPARISON:  None. FINDINGS: Gallbladder: No gallstones or wall thickening visualized. No sonographic Murphy sign noted by sonographer. Common bile duct: Diameter: 3 mm Liver: No focal lesion identified. Few scattered foci of  reverberation artifact overlie the liver edge, which may reflect trace air related to recent biopsy. Within normal limits in parenchymal echogenicity. Portal vein is patent on color Doppler imaging with normal direction of blood flow towards the liver. IMPRESSION: 1. Few scattered foci of reverberation artifact arising from the hepatic margin, suspected to reflect trace air related to recent liver biopsy. 2. Otherwise unremarkable right upper quadrant ultrasound. Electronically Signed   By: Rise Mu M.D.   On: 07/28/2017 02:55     Procedures   ____________________________________________   INITIAL IMPRESSION / ASSESSMENT AND PLAN / ED COURSE  As part of my medical decision making, I reviewed the following data within the electronic MEDICAL RECORD NUMBER  57 year old female presenting with above-stated history and physical exam secondary to right upper quadrant pain status post liver biopsy yesterday.  Concern for possible bleeding/fluid collection and as such right upper quadrant ultrasound was performed which was unremarkable with the exception of a few scattered foci's of air consistent with recent biopsy.  Patient was given Percocet in the emergency department will be prescribed same for home. ____________________________________________  FINAL CLINICAL IMPRESSION(S) / ED DIAGNOSES  Final diagnoses:  RUQ pain  Pain, postoperative, acute     MEDICATIONS GIVEN DURING THIS VISIT:  Medications  oxyCODONE (Oxy IR/ROXICODONE) immediate release tablet 10 mg (10 mg Oral Given 07/28/17 0225)     ED Discharge Orders    None       Note:  This document was prepared using Dragon voice recognition software and may include unintentional dictation errors.    Darci Current, MD 07/28/17 [phone]

## 2017-07-28 NOTE — Telephone Encounter (Signed)
Patient called and wanted to let you know that she had to go to the ER last night due to pain after her biopsy.  She is feeling better today.

## 2017-07-29 ENCOUNTER — Other Ambulatory Visit: Payer: Self-pay | Admitting: Internal Medicine

## 2017-07-29 LAB — SURGICAL PATHOLOGY

## 2017-07-29 MED ORDER — CLONAZEPAM 0.5 MG PO TABS: 0.5000 mg | ORAL_TABLET | Freq: Every day | ORAL | 0 refills | Status: DC | PRN

## 2017-07-29 NOTE — Telephone Encounter (Signed)
Diana [name] with [facility] called; pt is going out of town for 2 weeks to take care of her mother in [place] and pt would appreciate R Baity NP sending the refill on clonazepam to [facility] and then when pt returns will come in and discuss with Pamala Hurry Baity NP about medication. Diana [name] scheduled pt appt on 08/16/17. Thank you.

## 2017-07-29 NOTE — Telephone Encounter (Signed)
Pt requesting refill clonazepam to [facility]; Last refilled # 30 on 06/01/17 last seen annual 06/30/17.

## 2017-07-29 NOTE — Telephone Encounter (Signed)
Call pt:  We need to discuss Clonazepam use. I would rather her be on a SSRI or SNRI daily and try to reduce frequency of Clonazepam use. Is she okay with this? Ok to offer appt if she would like to discuss.

## 2017-07-29 NOTE — Telephone Encounter (Signed)
Patient has scheduled an appointment for 08/16/17 with Nicki Reaperegina Baity to discuss medication.

## 2017-07-29 NOTE — Telephone Encounter (Signed)
Copied from CRM [phone]. Topic: Quick Communication - Rx Refill/Question >> Jul 29, 2017  1:07 PM [name], Diana Chiquito L wrote: Medication: clonazePAM (KLONOPIN) 0.5 MG tablet    pt going to [place] today at 2 Has the patient contacted their pharmacy? Yes.  Pt states that pharmacy faxed over a request 2 days ago [place] at 2 (Agent: If no, request that the patient contact the pharmacy for the refill.) Preferred Pharmacy (with phone number or street name):   [facility] - [place] - [address] [phone] (Phone) [phone] (Fax)     Agent: Please be advised that RX refills may take up to 3 business days. We ask that you follow-up with your pharmacy.

## 2017-07-29 NOTE — Telephone Encounter (Signed)
clonazepam refill Last OV: 06/30/17 Last Refill:06/01/17 #30 tabs Pharmacy:[facility] [address] PCP: Nicki Reaperegina Baity NP

## 2017-07-29 NOTE — Telephone Encounter (Signed)
I called pt and she is aware clonazepam has been sent to [facility]. Pt voiced understanding.

## 2017-07-29 NOTE — Telephone Encounter (Signed)
Last filled 06/01/17... Please advise 

## 2017-08-04 ENCOUNTER — Telehealth: Payer: Self-pay | Admitting: Gastroenterology

## 2017-08-04 DIAGNOSIS — B181 Chronic viral hepatitis B without delta-agent: Secondary | ICD-10-CM

## 2017-08-04 MED ORDER — ENTECAVIR 0.5 MG PO TABS: 0.5000 mg | ORAL_TABLET | Freq: Every day | ORAL | 2 refills | Status: DC

## 2017-08-04 NOTE — Telephone Encounter (Signed)
Patient calling wanting liver biopsy results.

## 2017-08-04 NOTE — Telephone Encounter (Signed)
Discussed with patient about the liver biopsy results. It confirms inflammation secondary to hepatitis B infection. There is also evidence of mild interphase hepatitis and infiltration with plasma cells.  I recommend to start patient on entecavir 0.5 mg daily or tenofovir alafenamide (25 mg daily) rather than tenofovir disoproxil fumarate (300 mg daily) based on her insurance coverage  I will see her during next follow-up visit in 08/2017  Labs to be monitored while she is on therapy  LFTs, HBV DNA every 3 months until viral load undetected then every 6 months Hepatitis B e antigen, hepatitis B e antibody every 6 months HBsAg annually  Patient expressed understanding of the plan  Diana Repressohini R Harvey Lingo, MD [address]  [address]  [place], [place] [address]  Main: [phone]  Fax: [phone] Pager: [phone]

## 2017-08-05 NOTE — Telephone Encounter (Signed)
Returned patients call back regarding a question she had, however I had to leave message on voice mail for her to call back.  Thanks [employer]le

## 2017-08-05 NOTE — Telephone Encounter (Signed)
Patient would like a call back. She has a question to ask.

## 2017-08-11 ENCOUNTER — Telehealth: Payer: Self-pay | Admitting: Gastroenterology

## 2017-08-11 ENCOUNTER — Ambulatory Visit: Payer: BLUE CROSS/BLUE SHIELD | Admitting: Gastroenterology

## 2017-08-11 NOTE — Telephone Encounter (Signed)
She received medication yesterday, will start from today.  Follow up with me next week  Arlyss Repressohini R Kenslie Abbruzzese, MD [address]  [address]  [place], [place] [address]  Main: [phone]  Fax: [phone] Pager: [phone]

## 2017-08-16 ENCOUNTER — Ambulatory Visit (INDEPENDENT_AMBULATORY_CARE_PROVIDER_SITE_OTHER): Payer: BLUE CROSS/BLUE SHIELD | Admitting: Internal Medicine

## 2017-08-16 ENCOUNTER — Encounter: Payer: Self-pay | Admitting: Internal Medicine

## 2017-08-16 DIAGNOSIS — F411 Generalized anxiety disorder: Secondary | ICD-10-CM | POA: Diagnosis not present

## 2017-08-16 NOTE — Patient Instructions (Signed)

## 2017-08-16 NOTE — Assessment & Plan Note (Signed)
Intermittent daily She really dose not want to start daily therapy Will continue Clonazepam prn now, monitor use

## 2017-08-16 NOTE — Progress Notes (Signed)
Subjective:    Patient ID: Diana [name], female    DOB: 1960-10-31, 57 y.o.   MRN: [id number]  HPI  Pt presents to the clinic today to discuss medication. She has a history of anxiety. This is triggered by marital problems. She reports she is currently separated from her husband, but still lives with him to help take care of him due to chronic illness. She has taken Paxil and Lexapro in the past. She takes Clonazepam prn for anxiety. I asked her to come in today to discuss alternative treatment with a SSRI/SNRI. She denies depression, SI/HI.  Review of Systems      Past Medical History:  Diagnosis Date  . Allergy   . Anxiety   . Ear infection    recurrent  . Hypertension     Current Outpatient Medications  Medication Sig Dispense Refill  . cetirizine (ZYRTEC) 10 MG tablet Take 10 mg by mouth daily.    . clonazePAM (KLONOPIN) 0.5 MG tablet Take 1 tablet (0.5 mg total) by mouth daily as needed. for anxiety 30 tablet 0  . entecavir (BARACLUDE) 0.5 MG tablet Take 1 tablet (0.5 mg total) by mouth daily. 90 tablet 2  . hydrochlorothiazide (MICROZIDE) 12.5 MG capsule Take 1 capsule (12.5 mg total) by mouth daily. 30 capsule 10  . omeprazole (PRILOSEC) 20 MG capsule TAKE 1 CAPSULE BY MOUTH DAILY. 30 capsule 6  . potassium chloride (K-DUR) 10 MEQ tablet Take 2 tablets in the morning and 1 tablet in the evening. 90 tablet 2  . Vitamin D, Ergocalciferol, (DRISDOL) 50000 units CAPS capsule Take 1 capsule (50,000 Units total) by mouth every 7 (seven) days. 12 capsule 0   No current facility-administered medications for this visit.     No Known Allergies  Family History  Problem Relation Age of Onset  . Hypertension Mother        alive  . Arthritis Mother   . Lung cancer Father        deceased  . Heart disease Father   . Other Unknown        1 brother and 1 sister both alive an healthy  . Hypertension Maternal Grandmother     Social History   Socioeconomic History  . Marital  status: Married    Spouse name: Not on file  . Number of children: 3  . Years of education: Not on file  . Highest education level: Not on file  Occupational History  . Occupation: Nutritional therapist: [employer]  Social Needs  . Financial resource strain: Not on file  . Food insecurity:    Worry: Not on file    Inability: Not on file  . Transportation needs:    Medical: Not on file    Non-medical: Not on file  Tobacco Use  . Smoking status: Former Smoker    Types: Cigarettes    Last attempt to quit: 04/20/1985    Years since quitting: 32.3  . Smokeless tobacco: Never Used  . Tobacco comment: remote tobacco use   Substance and Sexual Activity  . Alcohol use: No    Alcohol/week: 0.0 oz  . Drug use: No  . Sexual activity: Never  Lifestyle  . Physical activity:    Days per week: Not on file    Minutes per session: Not on file  . Stress: Not on file  Relationships  . Social connections:    Talks on phone: Not on file  Gets together: Not on file    Attends religious service: Not on file    Active member of club or organization: Not on file    Attends meetings of clubs or organizations: Not on file    Relationship status: Not on file  . Intimate partner violence:    Fear of current or ex partner: Not on file    Emotionally abused: Not on file    Physically abused: Not on file    Forced sexual activity: Not on file  Other Topics Concern  . Not on file  Social History Narrative  . Not on file     Constitutional: Denies fever, malaise, fatigue, headache or abrupt weight changes.  Neurological: Denies dizziness, difficulty with memory, difficulty with speech or problems with balance and coordination.  Psych: Pt reports anxiety. Denies depression, SI/HI.  No other specific complaints in a complete review of systems (except as listed in HPI above).  Objective:   Physical Exam   Pulse 76   Temp 98.1 F (36.7 C) (Oral)   Wt 188 lb (85.3 kg)   SpO2 97%    BMI 30.34 kg/m  Wt Readings from Last 3 Encounters:  08/16/17 188 lb (85.3 kg)  07/27/17 190 lb (86.2 kg)  07/27/17 188 lb (85.3 kg)    General: Appears her stated age, well developed, well nourished in NAD. Cardiovascular: Normal rate and rhythm.  Pulmonary/Chest: Normal effort and positive vesicular breath sounds. No respiratory distress. No wheezes, rales or ronchi noted.  Neurological: Alert and oriented.  Psychiatric: Mood and affect normal. Behavior is normal. Judgment and thought content normal.     BMET    Component Value Date/Time   NA 138 07/27/2017 2153   K 3.5 07/27/2017 2153   CL 103 07/27/2017 2153   CO2 29 07/27/2017 2153   GLUCOSE 120 (H) 07/27/2017 2153   BUN 14 07/27/2017 2153   CREATININE 0.57 07/27/2017 2153   CALCIUM 8.7 (L) 07/27/2017 2153   GFRNONAA >60 07/27/2017 2153   GFRAA >60 07/27/2017 2153    Lipid Panel     Component Value Date/Time   CHOL 98 06/30/2017 1453   TRIG 123.0 06/30/2017 1453   HDL 13.90 (L) 06/30/2017 1453   CHOLHDL 7 06/30/2017 1453   VLDL 24.6 06/30/2017 1453   LDLCALC 60 06/30/2017 1453    CBC    Component Value Date/Time   WBC 3.5 (L) 07/27/2017 1023   RBC 4.60 07/27/2017 1023   HGB 13.7 07/27/2017 1023   HCT 40.0 07/27/2017 1023   PLT 191 07/27/2017 1023   MCV 86.9 07/27/2017 1023   MCH 29.7 07/27/2017 1023   MCHC 34.2 07/27/2017 1023   RDW 14.8 (H) 07/27/2017 1023   LYMPHSABS 1.1 07/28/2016 1232   MONOABS 0.4 07/28/2016 1232   EOSABS 0.0 07/28/2016 1232   BASOSABS 0.0 07/28/2016 1232    Hgb A1C Lab Results  Component Value Date   HGBA1C 5.9 06/09/2016           Assessment & Plan:

## 2017-08-18 ENCOUNTER — Ambulatory Visit (INDEPENDENT_AMBULATORY_CARE_PROVIDER_SITE_OTHER): Payer: BLUE CROSS/BLUE SHIELD | Admitting: Gastroenterology

## 2017-08-18 ENCOUNTER — Encounter: Payer: Self-pay | Admitting: Gastroenterology

## 2017-08-18 VITALS — BP 138/76 | Wt 193.2 lb

## 2017-08-18 DIAGNOSIS — B181 Chronic viral hepatitis B without delta-agent: Secondary | ICD-10-CM

## 2017-08-18 DIAGNOSIS — Z1211 Encounter for screening for malignant neoplasm of colon: Secondary | ICD-10-CM | POA: Diagnosis not present

## 2017-08-18 NOTE — Progress Notes (Signed)
[name], MD [address]  [address]  [place], [place] [address]  Main: [phone]  Fax: [phone]    Gastroenterology Consultation  Referring Provider:     [name], NP Primary Care Physician:  [name], NP Primary Gastroenterologist:  Dr. [name] Reason for Consultation:  Chronic hepatitis B infection        HPI:   Diana [name] is a 57 y.o. female referred by Dr. Sampson Si, Salvadore Oxford, NP  for consultation & management of recent diagnosis of hepatitis B infection. Patient who is otherwise healthy, started experiencing intense itching about 2-3 months ago, lately she has been experiencing severe arthralgias of small and large joints. She went to her primary care doctor who checked LFTs which came back elevated predominantly transaminases, mildly elevated alkaline phosphatase and total bilirubin. This was on 07/01/2017. She had repeat LFTs which are persistently elevated. Her LFTs were last normal in 08/2016. She then underwent acute viral hepatitis panel which revealed hepatitis B surface antigen positive, hepatitis B core IgM positive. Hepatitis A antibody negative, hepatitis C antibody negative, HIV negative. She also had elevated CMV IgM antibodies. Patient reports that she has been in relationship with a 79 year old man for the last 6 months or so and following unprotected sex. She tells me that he has liver cancer and not known to have hepatitis B infection. She said, her partner is getting retested for hepatitis B after she was found to have hepatitis B. Her partner's aunt also has hepatitis B. Patient denies having blood transfusions in the past, IV drug abuse, was not active in [employer].  Follow up visit 07/19/17: She reports feeling better overall. Arthralgias are improving. She had further tests which revealed positive hepatitis B e antigen, negative e antibody, high viral load. Ultrasound liver negative for cirrhosis or liver lesions. She had  secondary liver disease workup which revealed weakly positive anti-smooth muscle antibodies. She tells me that her female partner was recently tested again for hepatitis B and came back negative and he is getting immunized against hepatitis B. She is perplexed how she contracted hepatitis B and unable to figure out how she got it.   Follow-up visit 08/18/2017 She continues to have large joint arthralgias especially shoulders and elbows. She started taking entecavir 0.5 mg daily. And, tolerating it well. Patient underwent liver biopsy which revealed features consistent with chronic hepatitis B infection. There was also evidence of mild interface hepatitis which goes along with her positive autoimmune serologies.   NSAIDs: she has been taking Advil and Aleve secondary to arthralgias, 4 pills daily  Antiplts/Anticoagulants/Anti thrombotics: none  GI Procedures: none She denies family history of colon cancer  Past Medical History:  Diagnosis Date  . Allergy   . Anxiety   . Ear infection    recurrent  . Hypertension     Past Surgical History:  Procedure Laterality Date  . TONSILLECTOMY    . TUBAL LIGATION     bilateral     Current Outpatient Medications:  .  cetirizine (ZYRTEC) 10 MG tablet, Take 10 mg by mouth daily., Disp: , Rfl:  .  clonazePAM (KLONOPIN) 0.5 MG tablet, Take 1 tablet (0.5 mg total) by mouth daily as needed. for anxiety, Disp: 30 tablet, Rfl: 0 .  entecavir (BARACLUDE) 0.5 MG tablet, Take 1 tablet (0.5 mg total) by mouth daily., Disp: 90 tablet, Rfl: 2 .  hydrochlorothiazide (MICROZIDE) 12.5 MG capsule, Take 1 capsule (12.5 mg total) by mouth  daily., Disp: 30 capsule, Rfl: 10 .  omeprazole (PRILOSEC) 20 MG capsule, TAKE 1 CAPSULE BY MOUTH DAILY., Disp: 30 capsule, Rfl: 6 .  potassium chloride (K-DUR) 10 MEQ tablet, Take 2 tablets in the morning and 1 tablet in the evening., Disp: 90 tablet, Rfl: 2 .  Vitamin D, Ergocalciferol, (DRISDOL) 50000 units CAPS capsule, Take 1  capsule (50,000 Units total) by mouth every 7 (seven) days., Disp: 12 capsule, Rfl: 0   Family History  Problem Relation Age of Onset  . Hypertension Mother        alive  . Arthritis Mother   . Lung cancer Father        deceased  . Heart disease Father   . Other Unknown        1 brother and 1 sister both alive an healthy  . Hypertension Maternal Grandmother      Social History   Tobacco Use  . Smoking status: Former Smoker    Types: Cigarettes    Last attempt to quit: 04/20/1985    Years since quitting: 32.3  . Smokeless tobacco: Never Used  . Tobacco comment: remote tobacco use   Substance Use Topics  . Alcohol use: No    Alcohol/week: 0.0 oz  . Drug use: No    Allergies as of 08/18/2017  . (No Known Allergies)    Review of Systems:    All systems reviewed and negative except where noted in HPI.   Physical Exam:  BP 138/76   Wt 193 lb 3.2 oz (87.6 kg)   BMI 31.18 kg/m  No LMP recorded. (Menstrual status: Perimenopausal).  General:   Alert,  Well-developed, well-nourished, pleasant and cooperative in NAD Head:  Normocephalic and atraumatic. Eyes:  Sclera clear, no icterus.   Conjunctiva pink. Ears:  Normal auditory acuity. Nose:  No deformity, discharge, or lesions. Mouth:  No deformity or lesions,oropharynx pink & moist. Neck:  Supple; no masses or thyromegaly. Lungs:  Respirations even and unlabored.  Clear throughout to auscultation.   No wheezes, crackles, or rhonchi. No acute distress. Heart:  Regular rate and rhythm; no murmurs, clicks, rubs, or gallops. Abdomen:  Normal bowel sounds. Soft, non-tender and non-distended without masses, hepatosplenomegaly or hernias noted.  No guarding or rebound tenderness.   Rectal: Not performed Msk:  Symmetrical without gross deformities. Good, equal movement & strength bilaterally. Pulses:  Normal pulses noted. Extremities:  No clubbing or edema.  No cyanosis. Neurologic:  Alert and oriented x3;  grossly normal  neurologically. Skin:  Intact without significant lesions or rashes. No jaundice. Lymph Nodes:  No significant cervical adenopathy. Psych:  Alert and cooperative. Normal mood and affect.  Imaging Studies: Last ultrasound abdomen in 05/2015 which was unremarkable  Ultrasound abdomen 06/2017 unremarkable with no evidence of cirrhosis and no portal hypertension  Assessment and Plan:   Diana [name] is a 57 y.o. female with hypertension, GERD with recent diagnosis of acute hepatitis B infection, found to have elevated LFTs. Her hepatitis B surface antigen positive, hepatitis B core antibody IgM positive. She has hepatitis E antigen positive, E antibody negative, high viral load. This means she has chronic hepatitis B infection. She does not have clinical signs or symptoms to suggest chronic liver disease. There is no evidence of portal hypertension. Secondary liver disease workup revealed weakly positive anti-smooth muscle antibodies. She does not have hep C. Status post liver biopsy.  - continue entecavir 0.5 mg daily, reiterated with her that this would be her long-term medication  Labs to be monitored while she is on therapy for chronic hepatitis B infection  - LFTs, HBV DNA every 3 months until viral load undetected then every 6 months - Hepatitis B e antigen, hepatitis B e antibody every 6 months - HBsAg annually  - to avoid alcohol use, limit intake of NSAIDs and Tylenol use for arthralgias  Colon cancer screening: overdue Average risk Recommend colonoscopy and patient agreeable  Follow up in 3 months   [name], MD

## 2017-09-08 DIAGNOSIS — N950 Postmenopausal bleeding: Secondary | ICD-10-CM | POA: Diagnosis not present

## 2017-09-08 DIAGNOSIS — N814 Uterovaginal prolapse, unspecified: Secondary | ICD-10-CM | POA: Diagnosis not present

## 2017-09-20 DIAGNOSIS — N950 Postmenopausal bleeding: Secondary | ICD-10-CM | POA: Diagnosis not present

## 2017-09-20 DIAGNOSIS — N814 Uterovaginal prolapse, unspecified: Secondary | ICD-10-CM | POA: Diagnosis not present

## 2017-09-24 DIAGNOSIS — N814 Uterovaginal prolapse, unspecified: Secondary | ICD-10-CM | POA: Diagnosis not present

## 2017-09-24 DIAGNOSIS — N950 Postmenopausal bleeding: Secondary | ICD-10-CM | POA: Diagnosis not present

## 2017-09-30 DIAGNOSIS — N841 Polyp of cervix uteri: Secondary | ICD-10-CM | POA: Diagnosis not present

## 2017-09-30 DIAGNOSIS — Z87891 Personal history of nicotine dependence: Secondary | ICD-10-CM | POA: Diagnosis not present

## 2017-09-30 DIAGNOSIS — Z8619 Personal history of other infectious and parasitic diseases: Secondary | ICD-10-CM | POA: Diagnosis not present

## 2017-09-30 DIAGNOSIS — N814 Uterovaginal prolapse, unspecified: Secondary | ICD-10-CM | POA: Diagnosis not present

## 2017-09-30 DIAGNOSIS — Z79899 Other long term (current) drug therapy: Secondary | ICD-10-CM | POA: Diagnosis not present

## 2017-09-30 DIAGNOSIS — N72 Inflammatory disease of cervix uteri: Secondary | ICD-10-CM | POA: Diagnosis not present

## 2017-09-30 DIAGNOSIS — D259 Leiomyoma of uterus, unspecified: Secondary | ICD-10-CM | POA: Diagnosis not present

## 2017-09-30 DIAGNOSIS — N950 Postmenopausal bleeding: Secondary | ICD-10-CM | POA: Diagnosis not present

## 2017-10-01 DIAGNOSIS — Z79899 Other long term (current) drug therapy: Secondary | ICD-10-CM | POA: Diagnosis not present

## 2017-10-01 DIAGNOSIS — D259 Leiomyoma of uterus, unspecified: Secondary | ICD-10-CM | POA: Diagnosis not present

## 2017-10-01 DIAGNOSIS — Z87891 Personal history of nicotine dependence: Secondary | ICD-10-CM | POA: Diagnosis not present

## 2017-10-01 DIAGNOSIS — Z8619 Personal history of other infectious and parasitic diseases: Secondary | ICD-10-CM | POA: Diagnosis not present

## 2017-10-01 DIAGNOSIS — N950 Postmenopausal bleeding: Secondary | ICD-10-CM | POA: Diagnosis not present

## 2017-10-01 DIAGNOSIS — N814 Uterovaginal prolapse, unspecified: Secondary | ICD-10-CM | POA: Diagnosis not present

## 2017-10-05 ENCOUNTER — Other Ambulatory Visit: Payer: Self-pay | Admitting: Internal Medicine

## 2017-10-05 NOTE — Telephone Encounter (Signed)
Last filled 07/29/17 #30... Please advise

## 2017-10-06 ENCOUNTER — Encounter: Payer: Self-pay | Admitting: Family Medicine

## 2017-10-06 ENCOUNTER — Ambulatory Visit: Payer: Self-pay

## 2017-10-06 ENCOUNTER — Ambulatory Visit (INDEPENDENT_AMBULATORY_CARE_PROVIDER_SITE_OTHER): Payer: BLUE CROSS/BLUE SHIELD | Admitting: Family Medicine

## 2017-10-06 ENCOUNTER — Other Ambulatory Visit: Payer: Self-pay

## 2017-10-06 ENCOUNTER — Telehealth: Payer: Self-pay | Admitting: Internal Medicine

## 2017-10-06 VITALS — BP 118/80 | HR 85 | Temp 98.4°F | Ht 66.0 in | Wt 187.0 lb

## 2017-10-06 DIAGNOSIS — H8110 Benign paroxysmal vertigo, unspecified ear: Secondary | ICD-10-CM | POA: Diagnosis not present

## 2017-10-06 NOTE — Telephone Encounter (Signed)
Copied from CRM #[id number]. Topic: Quick [id number] - Rx Refill/Question >> Oct 06, 2017  3:27 PM Alexander BergeronBarksdale, Harvey B wrote: Medication: Meclizine 25 mg, 1 tablet by mouth as needed every 8 hours  Pt called b/c the pharmacy still has not received the medication above from Dr. Patsy Lageropland, call pt or pharmacy if needed  Pharmacy: [facility]

## 2017-10-06 NOTE — Progress Notes (Signed)
Dr. Karleen Hampshire T. Loistine Eberlin, MD, CAQ [facility] [address] [place], [address] Phone: [phone] Fax: [phone]  10/06/2017  Patient: Diana [name], MRN: [id number], DOB: [birth year], 57 y.o.  Primary Physician:  [name], NP   Chief Complaint  Patient presents with  . Dizziness    Room spinning when she woke up this morning-Hysterectomy last Thursday  . Tinnitus   Subjective:   Diana [name] is a 57 y.o. very pleasant female patient who presents with the following:  Woke up this morning and turned over and the room was spinning. Tried to focus on something that she was looking at.  This has improved throughout the day, and she has no neurological symptoms.   Past Medical History, Surgical History, Social History, Family History, Problem List, Medications, and Allergies have been reviewed and updated if relevant.  Patient Active Problem List   Diagnosis Date Noted  . GERD (gastroesophageal reflux disease) 06/09/2016  . Essential hypertension 03/07/2015  . Generalized anxiety disorder 03/07/2015    Past Medical History:  Diagnosis Date  . Allergy   . Anxiety   . Ear infection    recurrent  . Hypertension     Past Surgical History:  Procedure Laterality Date  . TONSILLECTOMY    . TUBAL LIGATION     bilateral    Social History   Socioeconomic History  . Marital status: Married    Spouse name: Not on file  . Number of children: 3  . Years of education: Not on file  . Highest education level: Not on file  Occupational History  . Occupation: Nutritional therapist: [employer]  Social Needs  . Financial resource strain: Not on file  . Food insecurity:    Worry: Not on file    Inability: Not on file  . Transportation needs:    Medical: Not on file    Non-medical: Not on file  Tobacco Use  . Smoking status: Former Smoker    Types: Cigarettes    Last attempt to quit: 04/20/1985    Years since  quitting: 32.4  . Smokeless tobacco: Never Used  . Tobacco comment: remote tobacco use   Substance and Sexual Activity  . Alcohol use: No    Alcohol/week: 0.0 oz  . Drug use: No  . Sexual activity: Never  Lifestyle  . Physical activity:    Days per week: Not on file    Minutes per session: Not on file  . Stress: Not on file  Relationships  . Social connections:    Talks on phone: Not on file    Gets together: Not on file    Attends religious service: Not on file    Active member of club or organization: Not on file    Attends meetings of clubs or organizations: Not on file    Relationship status: Not on file  . Intimate partner violence:    Fear of current or ex partner: Not on file    Emotionally abused: Not on file    Physically abused: Not on file    Forced sexual activity: Not on file  Other Topics Concern  . Not on file  Social History Narrative  . Not on file    Family History  Problem Relation Age of Onset  . Hypertension Mother        alive  . Arthritis Mother   . Lung cancer Father  deceased  . Heart disease Father   . Other Unknown        1 brother and 1 sister both alive an healthy  . Hypertension Maternal Grandmother     No Known Allergies  Medication list reviewed and updated in full in [facility].   GEN: No acute illnesses, no fevers, chills. GI: No n/v/d, eating normally Pulm: No SOB Interactive and getting along well at home.  Otherwise, ROS is as per the HPI.  Objective:   BP 118/80   Pulse 85   Temp 98.4 F (36.9 C) (Oral)   Ht 5\' 6"  (1.676 m)   Wt 187 lb (84.8 kg)   BMI 30.18 kg/m   GEN: WDWN, NAD, Non-toxic, A & O x 3 HEENT: Atraumatic, Normocephalic. Neck supple. No masses, No LAD. Ears and Nose: No external deformity. TM clear. Inducible head motion causes dizziness CV: RRR, No M/G/R. No JVD. No thrill. No extra heart sounds. PULM: CTA B, no wheezes, crackles, rhonchi. No retractions. No resp. distress. No  accessory muscle use. EXTR: No c/c/e NEURO Normal gait.  PSYCH: Normally interactive. Conversant. Not depressed or anxious appearing.  Calm demeanor.   Neuro: CN 2-12 grossly intact. PERRLA. EOMI. Sensation intact throughout. Str 5/5 all extremities. DTR 2+. No clonus. A and o x 4. Romberg neg. Finger nose neg. Heel -shin neg.   Laboratory and Imaging Data:  Assessment and Plan:   BPV (benign positional vertigo), unspecified laterality  The patient is normal neurologically, she has inducible vertigo.  Reassurance given.  She is to call if this if her symptoms do not resolve in 3 or 4 weeks.  Initially instructed her to buy over-the-counter meclizine or Dramamine, but she later called and requested that I sent in some prescription meclizine.  Follow-up: No follow-ups on file.  Meds ordered this encounter  Medications  . meclizine (ANTIVERT) 25 MG tablet    Sig: Take 1 tablet (25 mg total) by mouth 3 (three) times daily as needed for dizziness.    Dispense:  30 tablet    Refill:  0   Patient Instructions  Generic Meclizine 25 mg, 1 tablet by mouth as needed every 8 hours.   This is the generic non-drowsy dramamine    Signed,  Mishel Sans T. Alem Fahl, MD   Allergies as of 10/06/2017   No Known Allergies     Medication List        Accurate as of 10/06/17 11:59 PM. Always use your most recent med list.          cetirizine 10 MG tablet Commonly known as:  ZYRTEC Take 10 mg by mouth daily.   clonazePAM 0.5 MG tablet Commonly known as:  KLONOPIN TAKE 1 TABLET BY MOUTH DAILY AS NEEDED FOR ANXIETY   entecavir 0.5 MG tablet Commonly known as:  BARACLUDE Take 1 tablet (0.5 mg total) by mouth daily.   hydrochlorothiazide 12.5 MG capsule Commonly known as:  MICROZIDE Take 1 capsule (12.5 mg total) by mouth daily.   IBU 800 MG tablet Generic drug:  ibuprofen   meclizine 25 MG tablet Commonly known as:  ANTIVERT Take 1 tablet (25 mg total) by mouth 3 (three) times daily  as needed for dizziness.   omeprazole 20 MG capsule Commonly known as:  PRILOSEC TAKE 1 CAPSULE BY MOUTH DAILY.   potassium chloride 10 MEQ tablet Commonly known as:  K-DUR Take 2 tablets in the morning and 1 tablet in the evening.   Vitamin D (Ergocalciferol)  50000 units Caps capsule Commonly known as:  DRISDOL Take 1 capsule (50,000 Units total) by mouth every 7 (seven) days.

## 2017-10-06 NOTE — Patient Instructions (Signed)
Generic Meclizine 25 mg, 1 tablet by mouth as needed every 8 hours.   This is the generic non-drowsy dramamine

## 2017-10-06 NOTE — Telephone Encounter (Signed)
Pt. States she noticed some ringing in her ears 2 weeks ago. Also had a hysterectomy this past Thursday. Woke up at 0630 this morning and felt like the room was spinning. Changed her position in the bed and it stopped. Is up moving around now at home. Appointment made for today. No availability with Nicki Reaperegina Baity.  Reason for Disposition . [1] MODERATE dizziness (e.g., vertigo; feels very unsteady, interferes with normal activities) AND [2] has NOT been evaluated by physician for this  Answer Assessment - Initial Assessment Questions 1. DESCRIPTION: "Describe your dizziness."     Room spinning 2. VERTIGO: "Do you feel like either you or the room is spinning or tilting?"      Yes 3. LIGHTHEADED: "Do you feel lightheaded?" (e.g., somewhat faint, woozy, weak upon standing)     No 4. SEVERITY: "How bad is it?"  "Can you walk?"   - MILD - Feels unsteady but walking normally.   - MODERATE - Feels very unsteady when walking, but not falling; interferes with normal activities (e.g., school, work) .   - SEVERE - Unable to walk without falling (requires assistance).     Mild 5. ONSET:  "When did the dizziness begin?"     0630  6. AGGRAVATING FACTORS: "Does anything make it worse?" (e.g., standing, change in head position)     nO 7. CAUSE: "What do you think is causing the dizziness?"     uNSURE 8. RECURRENT SYMPTOM: "Have you had dizziness before?" If so, ask: "When was the last time?" "What happened that time?"     nO 9. OTHER SYMPTOMS: "Do you have any other symptoms?" (e.g., headache, weakness, numbness, vomiting, earache)     rINGING IN HER EARS 10. PREGNANCY: "Is there any chance you are pregnant?" "When was your last menstrual period?"       No  Protocols used: DIZZINESS - VERTIGO-A-AH

## 2017-10-06 NOTE — Telephone Encounter (Addendum)
Unable to reach pt by phone to verify no h/a, CP or SOB or no other symptoms; pt had hysterectomy on 09/30/17. Pt has appt with Dr Patsy Lageropland 10/06/17 at 11:40. Pt called back; pt said no h/a,CP or SOB. Pt is feeling OK now; pt is being careful so if dizziness returns suddenly she will not fall. Family member is driving pt to her appt this morning. Pt voiced understanding if she develops severe dizziness, CP, SOB or H/A will go to ED prior to appt.

## 2017-10-07 MED ORDER — MECLIZINE HCL 25 MG PO TABS: 25.0000 mg | ORAL_TABLET | Freq: Three times a day (TID) | ORAL | 0 refills | Status: DC | PRN

## 2017-10-07 NOTE — Telephone Encounter (Signed)
LOV 10/06/17.  Pt states Dr. Patsy Lageropland was to call in Meclizine 25 mg, 1 tablet by mouth as needed every 8 hours  Pharmacy still has not received the RX from Dr. Patsy Lageropland.  [facility]

## 2017-10-07 NOTE — Telephone Encounter (Signed)
We had discussed her buying it OTC, since her insurance appears to not pay for it. I am sending it in now.

## 2017-11-10 ENCOUNTER — Other Ambulatory Visit: Payer: Self-pay

## 2017-11-10 ENCOUNTER — Ambulatory Visit (INDEPENDENT_AMBULATORY_CARE_PROVIDER_SITE_OTHER): Payer: BLUE CROSS/BLUE SHIELD | Admitting: Gastroenterology

## 2017-11-10 ENCOUNTER — Encounter: Payer: Self-pay | Admitting: Gastroenterology

## 2017-11-10 ENCOUNTER — Other Ambulatory Visit
Admission: RE | Admit: 2017-11-10 | Discharge: 2017-11-10 | Disposition: A | Discharge status: Home / Self Care | Payer: BLUE CROSS/BLUE SHIELD | Source: Ambulatory Visit | Attending: Gastroenterology | Admitting: Gastroenterology

## 2017-11-10 VITALS — BP 121/80 | HR 71 | Resp 17 | Ht 66.0 in | Wt 190.2 lb

## 2017-11-10 DIAGNOSIS — Z1211 Encounter for screening for malignant neoplasm of colon: Secondary | ICD-10-CM | POA: Diagnosis not present

## 2017-11-10 DIAGNOSIS — B169 Acute hepatitis B without delta-agent and without hepatic coma: Secondary | ICD-10-CM | POA: Diagnosis not present

## 2017-11-10 DIAGNOSIS — B181 Chronic viral hepatitis B without delta-agent: Secondary | ICD-10-CM | POA: Diagnosis not present

## 2017-11-10 LAB — HEPATIC FUNCTION PANEL
ALBUMIN: 4.2 g/dL (ref 3.5–5.0)
ALK PHOS: 74 U/L (ref 38–126)
ALT: 13 U/L (ref 0–44)
AST: 21 U/L (ref 15–41)
Bilirubin, Direct: 0.1 mg/dL (ref 0.0–0.2)
Indirect Bilirubin: 2 mg/dL — ABNORMAL HIGH (ref 0.3–0.9)
TOTAL PROTEIN: 8 g/dL (ref 6.5–8.1)
Total Bilirubin: 2.1 mg/dL — ABNORMAL HIGH (ref 0.3–1.2)

## 2017-11-10 MED ORDER — ENTECAVIR 0.5 MG PO TABS: 0.5000 mg | ORAL_TABLET | Freq: Every day | ORAL | 2 refills | Status: DC

## 2017-11-10 MED ORDER — TENOFOVIR DISOPROXIL FUMARATE 300 MG PO TABS: 300.0000 mg | ORAL_TABLET | Freq: Every day | ORAL | 0 refills | Status: DC

## 2017-11-10 NOTE — Progress Notes (Signed)
[name], MD [address]  [address]  [place], [place] [address]  Main: [phone]  Fax: [phone]    Gastroenterology Consultation  Referring Provider:     [name], NP Primary Care Physician:  [name], NP Primary Gastroenterologist:  Dr. [name] Reason for Consultation:  Chronic hepatitis B infection        HPI:   Diana [name] is a 57 y.o. female referred by Dr. Sampson Si, Salvadore Oxford, NP  for consultation & management of recent diagnosis of hepatitis B infection. Patient who is otherwise healthy, started experiencing intense itching about 2-3 months ago, lately she has been experiencing severe arthralgias of small and large joints. She went to her primary care doctor who checked LFTs which came back elevated predominantly transaminases, mildly elevated alkaline phosphatase and total bilirubin. This was on 07/01/2017. She had repeat LFTs which are persistently elevated. Her LFTs were last normal in 08/2016. She then underwent acute viral hepatitis panel which revealed hepatitis B surface antigen positive, hepatitis B core IgM positive. Hepatitis A antibody negative, hepatitis C antibody negative, HIV negative. She also had elevated CMV IgM antibodies. Patient reports that she has been in relationship with a 31 year old man for the last 6 months or so and following unprotected sex. She tells me that he has liver cancer and not known to have hepatitis B infection. She said, her partner is getting retested for hepatitis B after she was found to have hepatitis B. Her partner's aunt also has hepatitis B. Patient denies having blood transfusions in the past, IV drug abuse, was not active in [employer].  Follow up visit 07/19/17: She reports feeling better overall. Arthralgias are improving. She had further tests which revealed positive hepatitis B e antigen, negative e antibody, high viral load. Ultrasound liver negative for cirrhosis or liver lesions. She had  secondary liver disease workup which revealed weakly positive anti-smooth muscle antibodies. She tells me that her female partner was recently tested again for hepatitis B and came back negative and he is getting immunized against hepatitis B. She is perplexed how she contracted hepatitis B and unable to figure out how she got it.   Follow-up visit 08/18/2017 She continues to have large joint arthralgias especially shoulders and elbows. She started taking entecavir 0.5 mg daily. And, tolerating it well. Patient underwent liver biopsy which revealed features consistent with chronic hepatitis B infection. There was also evidence of mild interface hepatitis which goes along with her positive autoimmune serologies.   Follow-up visit 11/10/2017 She no longer reports arthralgias in her large joints. She is doing overall well. She reports adherence to antiviral therapy. And, tolerating it well. She is not scheduled for screening colonoscopy yet.  NSAIDs: she recent longer taking NSAIDs  Antiplts/Anticoagulants/Anti thrombotics: none  GI Procedures: none She denies family history of colon cancer  Past Medical History:  Diagnosis Date  . Allergy   . Anxiety   . Ear infection    recurrent  . Hypertension     Past Surgical History:  Procedure Laterality Date  . TONSILLECTOMY    . TUBAL LIGATION     bilateral     Current Outpatient Medications:  .  cetirizine (ZYRTEC) 10 MG tablet, Take 10 mg by mouth daily., Disp: , Rfl:  .  clonazePAM (KLONOPIN) 0.5 MG tablet, TAKE 1 TABLET BY MOUTH DAILY AS NEEDED FOR ANXIETY, Disp: 30 tablet, Rfl: 0 .  hydrochlorothiazide (MICROZIDE) 12.5 MG capsule, Take 1 capsule (12.5  mg total) by mouth daily., Disp: 30 capsule, Rfl: 10 .  omeprazole (PRILOSEC) 20 MG capsule, TAKE 1 CAPSULE BY MOUTH DAILY., Disp: 30 capsule, Rfl: 6 .  potassium chloride (K-DUR) 10 MEQ tablet, Take 2 tablets in the morning and 1 tablet in the evening., Disp: 90 tablet, Rfl: 2 .  Vitamin  D, Ergocalciferol, (DRISDOL) 50000 units CAPS capsule, Take 1 capsule (50,000 Units total) by mouth every 7 (seven) days., Disp: 12 capsule, Rfl: 0 .  meclizine (ANTIVERT) 25 MG tablet, Take 1 tablet (25 mg total) by mouth 3 (three) times daily as needed for dizziness. (Patient not taking: Reported on 11/10/2017), Disp: 30 tablet, Rfl: 0 .  oxyCODONE (OXY IR/ROXICODONE) 5 MG immediate release tablet, , Disp: , Rfl: 0 .  tenofovir (VIREAD) 300 MG tablet, Take 1 tablet (300 mg total) by mouth daily., Disp: 90 tablet, Rfl: 0   Family History  Problem Relation Age of Onset  . Hypertension Mother        alive  . Arthritis Mother   . Lung cancer Father        deceased  . Heart disease Father   . Other Unknown        1 brother and 1 sister both alive an healthy  . Hypertension Maternal Grandmother      Social History   Tobacco Use  . Smoking status: Former Smoker    Types: Cigarettes    Last attempt to quit: 04/20/1985    Years since quitting: 32.5  . Smokeless tobacco: Never Used  . Tobacco comment: remote tobacco use   Substance Use Topics  . Alcohol use: No    Alcohol/week: 0.0 oz  . Drug use: No    Allergies as of 11/10/2017  . (No Known Allergies)    Review of Systems:    All systems reviewed and negative except where noted in HPI.   Physical Exam:  BP 121/80 (BP Location: Left Arm, Patient Position: Sitting, Cuff Size: Large)   Pulse 71   Resp 17   Ht 5\' 6"  (1.676 m)   Wt 190 lb 3.2 oz (86.3 kg)   BMI 30.70 kg/m  No LMP recorded. (Menstrual status: Perimenopausal).  General:   Alert,  Well-developed, well-nourished, pleasant and cooperative in NAD Head:  Normocephalic and atraumatic. Eyes:  Sclera clear, no icterus.   Conjunctiva pink. Ears:  Normal auditory acuity. Nose:  No deformity, discharge, or lesions. Mouth:  No deformity or lesions,oropharynx pink & moist. Neck:  Supple; no masses or thyromegaly. Lungs:  Respirations even and unlabored.  Clear throughout  to auscultation.   No wheezes, crackles, or rhonchi. No acute distress. Heart:  Regular rate and rhythm; no murmurs, clicks, rubs, or gallops. Abdomen:  Normal bowel sounds. Soft, non-tender and non-distended without masses, hepatosplenomegaly or hernias noted.  No guarding or rebound tenderness.   Rectal: Not performed Msk:  Symmetrical without gross deformities. Good, equal movement & strength bilaterally. Pulses:  Normal pulses noted. Extremities:  No clubbing or edema.  No cyanosis. Neurologic:  Alert and oriented x3;  grossly normal neurologically. Skin:  Intact without significant lesions or rashes. No jaundice. Lymph Nodes:  No significant cervical adenopathy. Psych:  Alert and cooperative. Normal mood and affect.  Imaging Studies: Last ultrasound abdomen in 05/2015 which was unremarkable  Ultrasound abdomen 06/2017 unremarkable with no evidence of cirrhosis and no portal hypertension  Assessment and Plan:   Diana [name] is a 57 y.o. female with hypertension, GERD Here for follow-up  of chronic hepatitis B infection. Her hepatitis D antigen is negative. She does not have clinical signs or symptoms to suggest chronic liver disease. There is no evidence of portal hypertension. Secondary liver disease workup revealed weakly positive anti-smooth muscle antibodies. She does not have hep C. Status post liver biopsy.  - I will switch from entecavir to viread 300mg  daily as this is better medication and decreases the risk for development of drug resistance, reiterated with her that this would be her long-term medication  Labs to be monitored while she is on therapy for chronic hepatitis B infection  - LFTs, HBV DNA every 3 months until viral load undetected then every 6 months - Hepatitis B e antigen, hepatitis B e antibody every 6 months - HBsAg annually - to avoid alcohol use, limit intake of NSAIDs and Tylenol use for arthralgias  Colon cancer screening: overdue Average  risk Recommend colonoscopy and patient agreeable Labs ordered  Follow up in 3 months   Arlyss Repressohini R Vanga, MD

## 2017-11-11 LAB — HEPATITIS B DNA, ULTRAQUANTITATIVE, PCR
HBV DNA SERPL PCR-ACNC: NOT DETECTED IU/mL
HBV DNA SERPL PCR-LOG IU: UNDETERMINED log10 IU/mL

## 2017-11-16 ENCOUNTER — Telehealth: Payer: Self-pay | Admitting: Gastroenterology

## 2017-11-16 NOTE — Telephone Encounter (Signed)
Patient called and stated she has questions regarding her test results. She stated Temeka didn't know the answer so she has been waiting for you to call her. Please call.

## 2017-11-22 ENCOUNTER — Ambulatory Visit: Payer: BLUE CROSS/BLUE SHIELD | Admitting: Gastroenterology

## 2017-11-23 ENCOUNTER — Telehealth: Payer: Self-pay | Admitting: Gastroenterology

## 2017-11-23 NOTE — Telephone Encounter (Signed)
Office notes have been faxed.

## 2017-11-23 NOTE — Telephone Encounter (Signed)
Diana [name] from [facility] calling to requests chart notes be faxed to [phone] in order for medication to be approved.

## 2017-11-30 DIAGNOSIS — H16043 Marginal corneal ulcer, bilateral: Secondary | ICD-10-CM | POA: Diagnosis not present

## 2017-12-07 ENCOUNTER — Encounter: Payer: Self-pay | Admitting: Internal Medicine

## 2017-12-07 ENCOUNTER — Ambulatory Visit (INDEPENDENT_AMBULATORY_CARE_PROVIDER_SITE_OTHER)
Admission: RE | Admit: 2017-12-07 | Discharge: 2017-12-07 | Disposition: A | Discharge status: Home / Self Care | Payer: BLUE CROSS/BLUE SHIELD | Source: Ambulatory Visit | Attending: Internal Medicine | Admitting: Internal Medicine

## 2017-12-07 ENCOUNTER — Ambulatory Visit (INDEPENDENT_AMBULATORY_CARE_PROVIDER_SITE_OTHER): Payer: BLUE CROSS/BLUE SHIELD | Admitting: Internal Medicine

## 2017-12-07 VITALS — BP 118/80 | HR 63 | Temp 97.9°F | Wt 192.0 lb

## 2017-12-07 DIAGNOSIS — R32 Unspecified urinary incontinence: Secondary | ICD-10-CM | POA: Diagnosis not present

## 2017-12-07 DIAGNOSIS — M25522 Pain in left elbow: Secondary | ICD-10-CM | POA: Diagnosis not present

## 2017-12-07 DIAGNOSIS — E559 Vitamin D deficiency, unspecified: Secondary | ICD-10-CM | POA: Diagnosis not present

## 2017-12-07 DIAGNOSIS — M79642 Pain in left hand: Secondary | ICD-10-CM | POA: Diagnosis not present

## 2017-12-07 DIAGNOSIS — S6992XA Unspecified injury of left wrist, hand and finger(s), initial encounter: Secondary | ICD-10-CM | POA: Diagnosis not present

## 2017-12-07 DIAGNOSIS — S59902A Unspecified injury of left elbow, initial encounter: Secondary | ICD-10-CM | POA: Diagnosis not present

## 2017-12-07 LAB — POC URINALSYSI DIPSTICK (AUTOMATED)
Bilirubin, UA: NEGATIVE
Blood, UA: NEGATIVE
Glucose, UA: NEGATIVE
Ketones, UA: NEGATIVE
LEUKOCYTES UA: NEGATIVE
NITRITE UA: NEGATIVE
PROTEIN UA: NEGATIVE
SPEC GRAV UA: 1.02 (ref 1.010–1.025)
Urobilinogen, UA: 0.2 E.U./dL
pH, UA: 6 (ref 5.0–8.0)

## 2017-12-07 LAB — VITAMIN D 25 HYDROXY (VIT D DEFICIENCY, FRACTURES): VITD: 28.08 ng/mL — ABNORMAL LOW (ref 30.00–100.00)

## 2017-12-07 NOTE — Patient Instructions (Signed)
RICE for Routine Care of Injuries Many injuries can be cared for using rest, ice, compression, and elevation (RICE therapy). Using RICE therapy can help to lessen pain and swelling. It can help your body to heal. Rest Reduce your normal activities and avoid using the injured part of your body. You can go back to your normal activities when you feel okay and your doctor says it is okay. Ice Do not put ice on your bare skin.  Put ice in a plastic bag.  Place a towel between your skin and the bag.  Leave the ice on for 20 minutes, 2-3 times a day.  Do this for as long as told by your doctor. Compression Compression means putting pressure on the injured area. This can be done with an elastic bandage. If an elastic bandage has been applied:  Remove and reapply the bandage every 3-4 hours or as told by your doctor.  Make sure the bandage is not wrapped too tight. Wrap the bandage more loosely if part of your body beyond the bandage is blue, swollen, cold, painful, or loses feeling (numb).  See your doctor if the bandage seems to make your problems worse.  Elevation Elevation means keeping the injured area raised. Raise the injured area above your heart or the center of your chest if you can. When should I get help? You should get help if:  You keep having pain and swelling.  Your symptoms get worse.  Get help right away if: You should get help right away if:  You have sudden bad pain at or below the area of your injury.  You have redness or more swelling around your injury.  You have tingling or numbness at or below the injury that does not go away when you take off the bandage.  This information is not intended to replace advice given to you by your health care provider. Make sure you discuss any questions you have with your health care provider. Document Released: 09/23/2007 Document Revised: 03/03/2016 Document Reviewed: 03/14/2014 Elsevier Interactive Patient Education  2017  Elsevier Inc[employer]  

## 2017-12-07 NOTE — Progress Notes (Addendum)
Subjective:    Patient ID: Diana [name], female    DOB: 05/10/1960, 57 y.o.   MRN: [id number]  HPI   Pt presents to the clinic today with multiple concerns:  1- She c/o left elbow pain. This started 1 week ago. She describes the pain as sore and achy. It isn't worse with movement. She denies redness, swelling, numbness, tingling or weakness. She denies any injury to the area. She has not tried anything OTC for her symptoms.  2- She also c/o left hand pain. She reports about 2-3 weeks ago, she hit her hand on the car door. She has had bruising, swelling and pain since that time. She denies weakness. She did put some ice on it but has not tried anything OTC for her symptoms.   3- She is due to have her Vit D repeated today. She finished Ergocalciferol 06/2017 but never came back to have the level rechecked.  4- She also reports dark urine. She denies urgency, frequency or dysuria. She denies vaginal complaints. She has not tried anything OTC for her symptoms.  Review of Systems  Past Medical History:  Diagnosis Date  . Allergy   . Anxiety   . Ear infection    recurrent  . Hypertension     Current Outpatient Medications  Medication Sig Dispense Refill  . cetirizine (ZYRTEC) 10 MG tablet Take 10 mg by mouth daily.    . clonazePAM (KLONOPIN) 0.5 MG tablet TAKE 1 TABLET BY MOUTH DAILY AS NEEDED FOR ANXIETY 30 tablet 0  . hydrochlorothiazide (MICROZIDE) 12.5 MG capsule Take 1 capsule (12.5 mg total) by mouth daily. 30 capsule 10  . omeprazole (PRILOSEC) 20 MG capsule TAKE 1 CAPSULE BY MOUTH DAILY. 30 capsule 6  . potassium chloride (K-DUR) 10 MEQ tablet Take 2 tablets in the morning and 1 tablet in the evening. 90 tablet 2  . tenofovir (VIREAD) 300 MG tablet Take 1 tablet (300 mg total) by mouth daily. 90 tablet 0   No current facility-administered medications for this visit.     No Known Allergies  Family History  Problem Relation Age of Onset  . Hypertension Mother    alive  . Arthritis Mother   . Lung cancer Father        deceased  . Heart disease Father   . Other Unknown        1 brother and 1 sister both alive an healthy  . Hypertension Maternal Grandmother     Social History   Socioeconomic History  . Marital status: Married    Spouse name: Not on file  . Number of children: 3  . Years of education: Not on file  . Highest education level: Not on file  Occupational History  . Occupation: Nutritional therapistcustomer service    Employer: [employer]  Social Needs  . Financial resource strain: Not on file  . Food insecurity:    Worry: Not on file    Inability: Not on file  . Transportation needs:    Medical: Not on file    Non-medical: Not on file  Tobacco Use  . Smoking status: Former Smoker    Types: Cigarettes    Last attempt to quit: 04/20/1985    Years since quitting: 32.6  . Smokeless tobacco: Never Used  . Tobacco comment: remote tobacco use   Substance and Sexual Activity  . Alcohol use: No    Alcohol/week: 0.0 standard drinks  . Drug use: No  . Sexual activity: Never  Lifestyle  . Physical activity:    Days per week: Not on file    Minutes per session: Not on file  . Stress: Not on file  Relationships  . Social connections:    Talks on phone: Not on file    Gets together: Not on file    Attends religious service: Not on file    Active member of club or organization: Not on file    Attends meetings of clubs or organizations: Not on file    Relationship status: Not on file  . Intimate partner violence:    Fear of current or ex partner: Not on file    Emotionally abused: Not on file    Physically abused: Not on file    Forced sexual activity: Not on file  Other Topics Concern  . Not on file  Social History Narrative  . Not on file     Constitutional: Denies fever, malaise, fatigue, headache or abrupt weight changes.  GU: Pt reports dark urine. Denies urgency, frequency, dysuria or blood in her urine.  Musculoskeletal: Pt  reports left elbow, left hand pain. Denies decrease in range of motion, difficulty with gait, muscle pain.  Skin: Pt reports bruising of left hand. Denies redness, rashes, lesions or ulcercations.    No other specific complaints in a complete review of systems (except as listed in HPI above).     Objective:   Physical Exam BP 118/80   Pulse 63   Temp 97.9 F (36.6 C) (Oral)   Wt 192 lb (87.1 kg)   SpO2 98%   BMI 30.99 kg/m  Wt Readings from Last 3 Encounters:  12/07/17 192 lb (87.1 kg)  11/10/17 190 lb 3.2 oz (86.3 kg)  10/06/17 187 lb (84.8 kg)    General: Appears her stated age, well developed, well nourished in NAD. Skin: Warm, dry and intact. Bruising noted over 1st left metacarpal. Abdomen: Soft, non tender. No CVA tenderness noted. Musculoskeletal: Normal flexion, extension and rotation of the left elbow. No joint swelling noted. No pain with palpation. Normal flexion, extension and rotation of the left wrist. Swelling and pain with palpation over the 1st left metacarpal. Hand grips equal Neurological: Alert and oriented. Sensation intact to BUE.  BMET    Component Value Date/Time   NA 138 07/27/2017 2153   K 3.5 07/27/2017 2153   CL 103 07/27/2017 2153   CO2 29 07/27/2017 2153   GLUCOSE 120 (H) 07/27/2017 2153   BUN 14 07/27/2017 2153   CREATININE 0.57 07/27/2017 2153   CALCIUM 8.7 (L) 07/27/2017 2153   GFRNONAA >60 07/27/2017 2153   GFRAA >60 07/27/2017 2153    Lipid Panel     Component Value Date/Time   CHOL 98 06/30/2017 1453   TRIG 123.0 06/30/2017 1453   HDL 13.90 (L) 06/30/2017 1453   CHOLHDL 7 06/30/2017 1453   VLDL 24.6 06/30/2017 1453   LDLCALC 60 06/30/2017 1453    CBC    Component Value Date/Time   WBC 3.5 (L) 07/27/2017 1023   RBC 4.60 07/27/2017 1023   HGB 13.7 07/27/2017 1023   HCT 40.0 07/27/2017 1023   PLT 191 07/27/2017 1023   MCV 86.9 07/27/2017 1023   MCH 29.7 07/27/2017 1023   MCHC 34.2 07/27/2017 1023   RDW 14.8 (H)  07/27/2017 1023   LYMPHSABS 1.1 07/28/2016 1232   MONOABS 0.4 07/28/2016 1232   EOSABS 0.0 07/28/2016 1232   BASOSABS 0.0 07/28/2016 1232    Hgb A1C Lab Results  Component Value Date   HGBA1C 5.9 06/09/2016           Assessment & Plan:   Vit D Deficiency:  Repeat Vit D today  Left Elbow Pain:  ? Arthritis Will check xray left elbow today Ibuprofen will be helpful  Left Hand Pain s/p Trauma:  Xray left hand today Continue ice as needed Ibuprofen may be helpful  Dark Urine:  Urinalysis: normal Increase water intake  Return precautions discussed, will follow up after labs and xray Diana Reaperegina Latasia Silberstein, NP

## 2017-12-07 NOTE — Addendum Note (Signed)
Addended by: Roena MaladyEVONTENNO, Teddi Badalamenti Y on: 12/07/2017 04:53 PM   Modules accepted: Orders

## 2017-12-17 ENCOUNTER — Telehealth: Payer: Self-pay | Admitting: Gastroenterology

## 2017-12-17 NOTE — Telephone Encounter (Signed)
Returned pt call attempted to leave VM but her box was full

## 2017-12-17 NOTE — Telephone Encounter (Signed)
Pt left vm she has a few questions regarding rx

## 2017-12-21 ENCOUNTER — Other Ambulatory Visit: Payer: Self-pay

## 2017-12-21 NOTE — Telephone Encounter (Signed)
Ok spoke with pt and she wants a call from you because now she wants to continue taking this medication because she states she is not definitely sure as to if this particular medication is causing the abdominal cramping so she wants to keep taking it and wait before switching back but wants to talk to you as to why \\the  medication was switched, I did explain to the patient per your office note as to why it was changed but she wants to talk with you, please return pt call   Thanks TG

## 2017-12-21 NOTE — Telephone Encounter (Signed)
Patient left vm returning call to discuss medication.

## 2017-12-21 NOTE — Telephone Encounter (Signed)
Spoke with patient and she is requesting a call back from you concerning new medication she has been prescribed Tenofovir (Viread) 300 mg is causing her to have abdominal cramping, so pt is asking if she can go back to previous medication entecavir 0.5 mg because she feels it works better for her, please return pt call or advise   Thanks TG

## 2017-12-21 NOTE — Telephone Encounter (Signed)
Ok great, I will send prescription today.  Thanks TG

## 2017-12-29 ENCOUNTER — Other Ambulatory Visit: Payer: Self-pay | Admitting: Internal Medicine

## 2017-12-30 DIAGNOSIS — N3941 Urge incontinence: Secondary | ICD-10-CM | POA: Diagnosis not present

## 2017-12-30 DIAGNOSIS — N311 Reflex neuropathic bladder, not elsewhere classified: Secondary | ICD-10-CM | POA: Diagnosis not present

## 2018-01-06 ENCOUNTER — Other Ambulatory Visit: Payer: Self-pay | Admitting: Internal Medicine

## 2018-01-06 NOTE — Telephone Encounter (Signed)
Last filled 10/05/2017... Please advise

## 2018-01-13 ENCOUNTER — Telehealth: Payer: Self-pay | Admitting: Gastroenterology

## 2018-01-13 NOTE — Telephone Encounter (Signed)
Pt would like a call regarding her rx she is on

## 2018-01-14 ENCOUNTER — Ambulatory Visit (INDEPENDENT_AMBULATORY_CARE_PROVIDER_SITE_OTHER): Payer: BLUE CROSS/BLUE SHIELD | Admitting: Internal Medicine

## 2018-01-14 ENCOUNTER — Other Ambulatory Visit: Payer: Self-pay

## 2018-01-14 VITALS — BP 120/80 | HR 68 | Temp 98.0°F | Wt 192.0 lb

## 2018-01-14 DIAGNOSIS — M7989 Other specified soft tissue disorders: Secondary | ICD-10-CM

## 2018-01-14 DIAGNOSIS — M62830 Muscle spasm of back: Secondary | ICD-10-CM

## 2018-01-14 DIAGNOSIS — H9312 Tinnitus, left ear: Secondary | ICD-10-CM

## 2018-01-14 MED ORDER — ENTECAVIR 0.5 MG PO TABS: 0.5000 mg | ORAL_TABLET | Freq: Every day | ORAL | 0 refills | Status: DC

## 2018-01-14 MED ORDER — CYCLOBENZAPRINE HCL 5 MG PO TABS: 5.0000 mg | ORAL_TABLET | Freq: Every evening | ORAL | 0 refills | Status: DC | PRN

## 2018-01-14 NOTE — Telephone Encounter (Signed)
Medication has been sent to [facility], pt has been notified and verbalized understanding

## 2018-01-14 NOTE — Progress Notes (Signed)
Subjective:    Patient ID: Diana [name], female    DOB: 1960/05/09, 57 y.o.   MRN: [id number]  HPI  Pt presents to the clinic today with multiple complaints.  1- She c/o eft upper back pain. She noticed this 3 days ago. She reports it feels like spasms. The pain does not radiate. It is not worse with movement. She denies any numbness, tingling or weakness of her arms. She denies any injury to the area. She has not tried anything OTC for this.  2: She also reports swelling of her 2nd/3rd fingers on her left hand. She reports her fingers have been swollen ever since she hit her hand about 5-6 weeks ago. She was seen 8/20 for the same, xray of the hand was negative. She denies abnormal cold sensation, numbness, tingling or weakness of those fingers. She has not tried anything OTC for her swelling. She does take HCTZ daily.  3- She also reports ringing in her left ear. She reports this started 1 week ago. It is constant. She denies ear pain, decreased hearing or drainage. She denies runny nose, nasal congestion, sore throat or cough. She has tried Claritin with minimal relief.  Review of Systems  Past Medical History:  Diagnosis Date  . Allergy   . Anxiety   . Ear infection    recurrent  . Hypertension     Current Outpatient Medications  Medication Sig Dispense Refill  . cetirizine (ZYRTEC) 10 MG tablet Take 10 mg by mouth daily.    . clonazePAM (KLONOPIN) 0.5 MG tablet TAKE 1 TABLET BY MOUTH DAILY AS NEEDED FOR ANXIETY 30 tablet 0  . entecavir (BARACLUDE) 0.5 MG tablet Take 1 tablet (0.5 mg total) by mouth daily. 90 tablet 0  . hydrochlorothiazide (MICROZIDE) 12.5 MG capsule Take 1 capsule (12.5 mg total) by mouth daily. 30 capsule 10  . omeprazole (PRILOSEC) 20 MG capsule TAKE 1 CAPSULE BY MOUTH EVERY DAY 30 capsule 0  . potassium chloride (K-DUR) 10 MEQ tablet Take 2 tablets in the morning and 1 tablet in the evening. 90 tablet 2  . tenofovir (VIREAD) 300 MG tablet Take 1 tablet  (300 mg total) by mouth daily. 90 tablet 0   No current facility-administered medications for this visit.     No Known Allergies  Family History  Problem Relation Age of Onset  . Hypertension Mother        alive  . Arthritis Mother   . Lung cancer Father        deceased  . Heart disease Father   . Other Unknown        1 brother and 1 sister both alive an healthy  . Hypertension Maternal Grandmother     Social History   Socioeconomic History  . Marital status: Married    Spouse name: Not on file  . Number of children: 3  . Years of education: Not on file  . Highest education level: Not on file  Occupational History  . Occupation: Nutritional therapist: [employer]  Social Needs  . Financial resource strain: Not on file  . Food insecurity:    Worry: Not on file    Inability: Not on file  . Transportation needs:    Medical: Not on file    Non-medical: Not on file  Tobacco Use  . Smoking status: Former Smoker    Types: Cigarettes    Last attempt to quit: 04/20/1985    Years since  quitting: 32.7  . Smokeless tobacco: Never Used  . Tobacco comment: remote tobacco use   Substance and Sexual Activity  . Alcohol use: No    Alcohol/week: 0.0 standard drinks  . Drug use: No  . Sexual activity: Never  Lifestyle  . Physical activity:    Days per week: Not on file    Minutes per session: Not on file  . Stress: Not on file  Relationships  . Social connections:    Talks on phone: Not on file    Gets together: Not on file    Attends religious service: Not on file    Active member of club or organization: Not on file    Attends meetings of clubs or organizations: Not on file    Relationship status: Not on file  . Intimate partner violence:    Fear of current or ex partner: Not on file    Emotionally abused: Not on file    Physically abused: Not on file    Forced sexual activity: Not on file  Other Topics Concern  . Not on file  Social History Narrative  .  Not on file     Constitutional: Denies fever, malaise, fatigue, headache or abrupt weight changes.  HEENT: Pt reports ringing in left ear. Denies eye pain, eye redness, ear pain, wax buildup, runny nose, nasal congestion, bloody nose, or sore throat. Respiratory: Denies difficulty breathing, shortness of breath, cough or sputum production.   Cardiovascular: Denies chest pain, chest tightness, palpitations or swelling in the hands or feet.  Musculoskeletal: Pt reports back pain, selling of fingers. Denies decrease in range of motion, difficulty with gait, or joint pain.  Neurological: Denies dizziness, difficulty with memory, difficulty with speech or problems with balance and coordination.    No other specific complaints in a complete review of systems (except as listed in HPI above).     Objective:   Physical Exam   BP 120/80   Pulse 68   Temp 98 F (36.7 C) (Oral)   Wt 192 lb (87.1 kg)   SpO2 98%   BMI 30.99 kg/m  Wt Readings from Last 3 Encounters:  01/14/18 192 lb (87.1 kg)  12/07/17 192 lb (87.1 kg)  11/10/17 190 lb 3.2 oz (86.3 kg)    General: Appears her stated age, obese, in NAD. Skin: Warm, dry and intact. No redness, warmth of fingers on left hand. HEENT: Head: normal shape and size; Eyes: sclera white, no icterus, conjunctiva pink, PERRLA and EOMs intact; Ears: Tm's gray and intact, normal light reflex;  Cardiovascular: Cap refill< 3 secs. Radial pulse 2+ bilaterally Pulmonary/Chest: Normal effort and positive vesicular breath sounds. No respiratory distress. No wheezes, rales or ronchi noted.  Musculoskeletal: No bony tenderness noted over the spine. No pain with palpation of the para spinal muscles but she points to an area, just left of the left scapula where she feels like the muscle spasms are. Normal flexion and extension of the fingers. Swelling noted from 2nd and 3rd DIP's to the MCP's. Hand grips equal. Neurological: Alert and oriented. Sensation intact to  BUE.   BMET    Component Value Date/Time   NA 138 07/27/2017 2153   K 3.5 07/27/2017 2153   CL 103 07/27/2017 2153   CO2 29 07/27/2017 2153   GLUCOSE 120 (H) 07/27/2017 2153   BUN 14 07/27/2017 2153   CREATININE 0.57 07/27/2017 2153   CALCIUM 8.7 (L) 07/27/2017 2153   GFRNONAA >60 07/27/2017 2153   GFRAA >  60 07/27/2017 2153    Lipid Panel     Component Value Date/Time   CHOL 98 06/30/2017 1453   TRIG 123.0 06/30/2017 1453   HDL 13.90 (L) 06/30/2017 1453   CHOLHDL 7 06/30/2017 1453   VLDL 24.6 06/30/2017 1453   LDLCALC 60 06/30/2017 1453    CBC    Component Value Date/Time   WBC 3.5 (L) 07/27/2017 1023   RBC 4.60 07/27/2017 1023   HGB 13.7 07/27/2017 1023   HCT 40.0 07/27/2017 1023   PLT 191 07/27/2017 1023   MCV 86.9 07/27/2017 1023   MCH 29.7 07/27/2017 1023   MCHC 34.2 07/27/2017 1023   RDW 14.8 (H) 07/27/2017 1023   LYMPHSABS 1.1 07/28/2016 1232   MONOABS 0.4 07/28/2016 1232   EOSABS 0.0 07/28/2016 1232   BASOSABS 0.0 07/28/2016 1232    Hgb A1C Lab Results  Component Value Date   HGBA1C 5.9 06/09/2016           Assessment & Plan:   Tinnitus, Left Ear:  Try Flonase BID x 3 days then daily thereafter Can refer to ENT if no improvement  Finger Swelling:  Reviewed xray, negative for fracture Increase HCTZ to 25 mg daily x 3 days Encouraged elevation of the fingers above the level of the heart Let me know if not improving  Muscle Spasms of Back:  eRx for Flexeril 5 mg QHS prn-sedation caution given Encouraged stretching and heat  Return precautions discussed [name], NP

## 2018-01-15 ENCOUNTER — Encounter: Payer: Self-pay | Admitting: Internal Medicine

## 2018-01-15 NOTE — Patient Instructions (Signed)

## 2018-01-31 ENCOUNTER — Ambulatory Visit (INDEPENDENT_AMBULATORY_CARE_PROVIDER_SITE_OTHER): Payer: BLUE CROSS/BLUE SHIELD | Admitting: Family Medicine

## 2018-01-31 ENCOUNTER — Ambulatory Visit: Payer: Self-pay | Admitting: *Deleted

## 2018-01-31 ENCOUNTER — Encounter: Payer: Self-pay | Admitting: Family Medicine

## 2018-01-31 VITALS — BP 126/76 | HR 67 | Temp 97.7°F | Ht 66.0 in | Wt 194.0 lb

## 2018-01-31 DIAGNOSIS — B9789 Other viral agents as the cause of diseases classified elsewhere: Secondary | ICD-10-CM

## 2018-01-31 DIAGNOSIS — J069 Acute upper respiratory infection, unspecified: Secondary | ICD-10-CM

## 2018-01-31 NOTE — Patient Instructions (Signed)
For nasal congestion you can use Afrin nasal spray for 3 days max, Sudafed, saline nasal spray (generic is fine for all). For cough you can try Delsym or Robitussin DM.  Drink enough fluids to make your urine light yellow. For fever/chill/muscle aches you can take over the counter acetaminophen or ibuprofen.  Please come back in if you are not better in 5-7 days or if you develop wheezing, shortness of breath or persistent vomiting.

## 2018-01-31 NOTE — Progress Notes (Signed)
Subjective:    Patient ID: Diana [name], female    DOB: 1960/08/23, 57 y.o.   MRN: [id number]  HPI This is a 57 yo female who presents today with cough x several days, non productive, throat is scratchy. Ribs feel sore but she doesn't think she has coughed enough for them to hurt so much. Sleeping ok.  Hoarse voice. No known sick contacts. Has not taken any medications for symptoms. No SOB, no wheeze. No fever.    Past Medical History:  Diagnosis Date  . Allergy   . Anxiety   . Ear infection    recurrent  . Hypertension    Past Surgical History:  Procedure Laterality Date  . TONSILLECTOMY    . TUBAL LIGATION     bilateral   Family History  Problem Relation Age of Onset  . Hypertension Mother        alive  . Arthritis Mother   . Lung cancer Father        deceased  . Heart disease Father   . Other Unknown        1 brother and 1 sister both alive an healthy  . Hypertension Maternal Grandmother    Social History   Tobacco Use  . Smoking status: Former Smoker    Types: Cigarettes    Last attempt to quit: 04/20/1985    Years since quitting: 32.8  . Smokeless tobacco: Never Used  . Tobacco comment: remote tobacco use   Substance Use Topics  . Alcohol use: No    Alcohol/week: 0.0 standard drinks  . Drug use: No      Review of Systems Per HPI    Objective:   Physical Exam  Constitutional: She appears well-developed and well-nourished. No distress.  HENT:  Head: Normocephalic and atraumatic.  Right Ear: External ear and ear canal normal.  Left Ear: External ear and ear canal normal.  Nose: Mucosal edema present.  Mouth/Throat: Uvula is midline and mucous membranes are normal. Posterior oropharyngeal erythema (mild) present. No oropharyngeal exudate or posterior oropharyngeal edema.  Slightly dull TMs.   Eyes: Conjunctivae are normal.  Neck: Normal range of motion. Neck supple.  Cardiovascular: Normal rate, regular rhythm and normal heart sounds.    Pulmonary/Chest: Effort normal and breath sounds normal.  Skin: Skin is warm and dry. She is not diaphoretic.  Psychiatric: She has a normal mood and affect. Her behavior is normal. Judgment and thought content normal.  Vitals reviewed.     BP 126/76 (BP Location: Right Arm, Patient Position: Sitting, Cuff Size: Large)   Pulse 67   Temp 97.7 F (36.5 C) (Oral)   Ht 5\' 6"  (1.676 m)   Wt 194 lb (88 kg)   SpO2 98%   BMI 31.31 kg/m  Wt Readings from Last 3 Encounters:  01/31/18 194 lb (88 kg)  01/14/18 192 lb (87.1 kg)  12/07/17 192 lb (87.1 kg)       Assessment & Plan:  1. Viral URI with cough - Provided written and verbal information regarding diagnosis and treatment. -  Patient Instructions  For nasal congestion you can use Afrin nasal spray for 3 days max, Sudafed, saline nasal spray (generic is fine for all). For cough you can try Delsym or Robitussin DM.  Drink enough fluids to make your urine light yellow. For fever/chill/muscle aches you can take over the counter acetaminophen or ibuprofen.  Please come back in if you are not better in 5-7 days or if you develop  wheezing, shortness of breath or persistent vomiting.    Olean Ree, FNP-BC  [facility], [facility]  01/31/2018 4:58 PM

## 2018-01-31 NOTE — Telephone Encounter (Signed)
Pt called with complaints of having a sore throat for a few days; then she started coughing and she started loosing her voice on 01/30/18; she is most concerned about chest pain when coughing which started on 01/29/18;  The pt also says she has ringing in her left ear; recommendations made per nurse triage protocol; the pt normally sees [facility] but she has no availability; pt offered and accepted appointment with Olean Ree, [facility] 01/31/18 at 1400; will route to office for notification of this upcoming appointment.   Reason for Disposition . Earache is present  Answer Assessment - Initial Assessment Questions 1. ONSET: "When did the cough begin?"      01/29/18 2. SEVERITY: "How bad is the cough today?"      moderate 3. RESPIRATORY DISTRESS: "Describe your breathing."      no 4. FEVER: "Do you have a fever?" If so, ask: "What is your temperature, how was it measured, and when did it start?"     no 5. HEMOPTYSIS: "Are you coughing up any blood?" If so ask: "How much?" (flecks, streaks, tablespoons, etc.)     no 6. TREATMENT: "What have you done so far to treat the cough?" (e.g., meds, fluids, humidifier)     Drinking water 7. CARDIAC HISTORY: "Do you have any history of heart disease?" (e.g., heart attack, congestive heart failure)    hypertension 8. LUNG HISTORY: "Do you have any history of lung disease?"  (e.g., pulmonary embolus, asthma, emphysema)     Asthma, bronchitis 9. PE RISK FACTORS: "Do you have a history of blood clots?" (or: recent major surgery, recent prolonged travel, bedridden)     no 10. OTHER SYMPTOMS: "Do you have any other symptoms? (e.g., runny nose, wheezing, chest pain)       Chest pain with coughing 11. PREGNANCY: "Is there any chance you are pregnant?" "When was your last menstrual period?"       No hysterectomy 12. TRAVEL: "Have you traveled out of the country in the last month?" (e.g., travel history, exposures)       no  Protocols used:  COUGH - ACUTE NON-PRODUCTIVE-A-AH

## 2018-02-04 ENCOUNTER — Other Ambulatory Visit: Payer: Self-pay | Admitting: Internal Medicine

## 2018-02-04 ENCOUNTER — Telehealth: Payer: Self-pay | Admitting: Gastroenterology

## 2018-02-04 NOTE — Telephone Encounter (Signed)
Pt is calling because her rx requires  pre- authorization through mail order

## 2018-02-07 NOTE — Telephone Encounter (Signed)
Pt is calling regarding her rx please call her she is almost out of rx and the pharmacy is out of net work she states there is a lot of back and forth

## 2018-02-08 ENCOUNTER — Other Ambulatory Visit: Payer: Self-pay

## 2018-02-08 MED ORDER — ENTECAVIR 0.5 MG PO TABS: 0.5000 mg | ORAL_TABLET | Freq: Every day | ORAL | 0 refills | Status: AC

## 2018-02-08 NOTE — Telephone Encounter (Signed)
Medication has been re sent to [facility] for refill, pt has been notified

## 2018-02-11 ENCOUNTER — Ambulatory Visit (INDEPENDENT_AMBULATORY_CARE_PROVIDER_SITE_OTHER): Payer: BLUE CROSS/BLUE SHIELD | Admitting: Gastroenterology

## 2018-02-11 ENCOUNTER — Encounter: Payer: Self-pay | Admitting: Gastroenterology

## 2018-02-11 ENCOUNTER — Other Ambulatory Visit
Admission: RE | Admit: 2018-02-11 | Discharge: 2018-02-11 | Disposition: A | Discharge status: Home / Self Care | Payer: BLUE CROSS/BLUE SHIELD | Source: Ambulatory Visit | Attending: Gastroenterology | Admitting: Gastroenterology

## 2018-02-11 ENCOUNTER — Telehealth: Payer: Self-pay | Admitting: Gastroenterology

## 2018-02-11 VITALS — BP 119/81 | HR 66 | Resp 16 | Ht 66.0 in | Wt 197.8 lb

## 2018-02-11 DIAGNOSIS — B181 Chronic viral hepatitis B without delta-agent: Secondary | ICD-10-CM | POA: Diagnosis not present

## 2018-02-11 LAB — HEPATIC FUNCTION PANEL
ALK PHOS: 69 U/L (ref 38–126)
ALT: 15 U/L (ref 0–44)
AST: 16 U/L (ref 15–41)
Albumin: 4.1 g/dL (ref 3.5–5.0)
BILIRUBIN DIRECT: 0.2 mg/dL (ref 0.0–0.2)
BILIRUBIN INDIRECT: 1.3 mg/dL — AB (ref 0.3–0.9)
BILIRUBIN TOTAL: 1.5 mg/dL — AB (ref 0.3–1.2)
Total Protein: 7.5 g/dL (ref 6.5–8.1)

## 2018-02-11 NOTE — Progress Notes (Signed)
[name], MD [address]  [address]  [place], [place] [address]  Main: [phone]  Fax: [phone]    Gastroenterology Consultation  Referring Provider:     [name], NP Primary Care Physician:  [name], NP Primary Gastroenterologist:  Dr. [name] Reason for Consultation:  Chronic hepatitis B infection        HPI:   Diana [name] is a 57 y.o. female referred by Dr. Sampson Si, Salvadore Oxford, NP  for consultation & management of recent diagnosis of hepatitis B infection. Patient who is otherwise healthy, started experiencing intense itching about 2-3 months ago, lately she has been experiencing severe arthralgias of small and large joints. She went to her primary care doctor who checked LFTs which came back elevated predominantly transaminases, mildly elevated alkaline phosphatase and total bilirubin. This was on 07/01/2017. She had repeat LFTs which are persistently elevated. Her LFTs were last normal in 08/2016. She then underwent acute viral hepatitis panel which revealed hepatitis B surface antigen positive, hepatitis B core IgM positive. Hepatitis A antibody negative, hepatitis C antibody negative, HIV negative. She also had elevated CMV IgM antibodies. Patient reports that she has been in relationship with a 68 year old man for the last 6 months or so and following unprotected sex. She tells me that he has liver cancer and not known to have hepatitis B infection. She said, her partner is getting retested for hepatitis B after she was found to have hepatitis B. Her partner's aunt also has hepatitis B. Patient denies having blood transfusions in the past, IV drug abuse, was not active in [employer].  Follow up visit 07/19/17: She reports feeling better overall. Arthralgias are improving. She had further tests which revealed positive hepatitis B e antigen, negative e antibody, high viral load. Ultrasound liver negative for cirrhosis or liver lesions. She had  secondary liver disease workup which revealed weakly positive anti-smooth muscle antibodies. She tells me that her female partner was recently tested again for hepatitis B and came back negative and he is getting immunized against hepatitis B. She is perplexed how she contracted hepatitis B and unable to figure out how she got it.   Follow-up visit 08/18/2017 She continues to have large joint arthralgias especially shoulders and elbows. She started taking entecavir 0.5 mg daily. And, tolerating it well. Patient underwent liver biopsy which revealed features consistent with chronic hepatitis B infection. There was also evidence of mild interface hepatitis which goes along with her positive autoimmune serologies.   Follow-up visit 11/10/2017 She no longer reports arthralgias in her large joints. She is doing overall well. She reports adherence to antiviral therapy. And, tolerating it well. She is not scheduled for screening colonoscopy yet.  Follow-up visit 02/11/2018 Patient is currently taking Viread 300 mg daily.  Awaiting to receive entecavir.  She was originally on entecavir, I switched her to my read to reduce the risk of resistance.  She initially had stomach upset on my read, currently experiencing myalgia as.  Therefore, decided to switch back to entecavir.  Otherwise, she denies any symptoms.  NSAIDs: she recent longer taking NSAIDs  Antiplts/Anticoagulants/Anti thrombotics: none  GI Procedures: none She denies family history of colon cancer  Past Medical History:  Diagnosis Date  . Allergy   . Anxiety   . Ear infection    recurrent  . Hypertension     Past Surgical History:  Procedure Laterality Date  . TONSILLECTOMY    . TUBAL LIGATION  bilateral     Current Outpatient Medications:  .  cetirizine (ZYRTEC) 10 MG tablet, Take 10 mg by mouth daily., Disp: , Rfl:  .  clonazePAM (KLONOPIN) 0.5 MG tablet, TAKE 1 TABLET BY MOUTH DAILY AS NEEDED FOR ANXIETY, Disp: 30 tablet,  Rfl: 0 .  entecavir (BARACLUDE) 0.5 MG tablet, Take 1 tablet (0.5 mg total) by mouth daily., Disp: 90 tablet, Rfl: 0 .  hydrochlorothiazide (MICROZIDE) 12.5 MG capsule, Take 1 capsule (12.5 mg total) by mouth daily., Disp: 30 capsule, Rfl: 10 .  omeprazole (PRILOSEC) 20 MG capsule, TAKE 1 CAPSULE BY MOUTH EVERY DAY, Disp: 30 capsule, Rfl: 0 .  potassium chloride (K-DUR) 10 MEQ tablet, Take 2 tablets in the morning and 1 tablet in the evening., Disp: 90 tablet, Rfl: 2 .  cyclobenzaprine (FLEXERIL) 5 MG tablet, Take 1 tablet (5 mg total) by mouth at bedtime as needed for muscle spasms. (Patient not taking: Reported on 01/31/2018), Disp: 20 tablet, Rfl: 0   Family History  Problem Relation Age of Onset  . Hypertension Mother        alive  . Arthritis Mother   . Lung cancer Father        deceased  . Heart disease Father   . Other Unknown        1 brother and 1 sister both alive an healthy  . Hypertension Maternal Grandmother      Social History   Tobacco Use  . Smoking status: Former Smoker    Types: Cigarettes    Last attempt to quit: 04/20/1985    Years since quitting: 32.8  . Smokeless tobacco: Never Used  . Tobacco comment: remote tobacco use   Substance Use Topics  . Alcohol use: No    Alcohol/week: 0.0 standard drinks  . Drug use: No    Allergies as of 02/11/2018  . (No Known Allergies)    Review of Systems:    All systems reviewed and negative except where noted in HPI.   Physical Exam:  BP 119/81 (BP Location: Left Arm, Patient Position: Sitting, Cuff Size: Large)   Pulse 66   Resp 16   Ht 5\' 6"  (1.676 m)   Wt 197 lb 12.8 oz (89.7 kg)   BMI 31.93 kg/m  No LMP recorded. (Menstrual status: Perimenopausal).  General:   Alert,  Well-developed, well-nourished, pleasant and cooperative in NAD Head:  Normocephalic and atraumatic. Eyes:  Sclera clear, no icterus.   Conjunctiva pink. Ears:  Normal auditory acuity. Nose:  No deformity, discharge, or lesions. Mouth:   No deformity or lesions,oropharynx pink & moist. Neck:  Supple; no masses or thyromegaly. Lungs:  Respirations even and unlabored.  Clear throughout to auscultation.   No wheezes, crackles, or rhonchi. No acute distress. Heart:  Regular rate and rhythm; no murmurs, clicks, rubs, or gallops. Abdomen:  Normal bowel sounds. Soft, non-tender and non-distended without masses, hepatosplenomegaly or hernias noted.  No guarding or rebound tenderness.   Rectal: Not performed Msk:  Symmetrical without gross deformities. Good, equal movement & strength bilaterally. Pulses:  Normal pulses noted. Extremities:  No clubbing or edema.  No cyanosis. Neurologic:  Alert and oriented x3;  grossly normal neurologically. Skin:  Intact without significant lesions or rashes. No jaundice. Lymph Nodes:  No significant cervical adenopathy. Psych:  Alert and cooperative. Normal mood and affect.  Imaging Studies: Last ultrasound abdomen in 05/2015 which was unremarkable  Ultrasound abdomen 06/2017 unremarkable with no evidence of cirrhosis and no portal hypertension  Assessment  and Plan:   Diana [name] is a 57 y.o. female with hypertension, GERD Here for follow-up of chronic hepatitis B infection. Her hepatitis D antigen is negative. She does not have clinical signs or symptoms to suggest chronic liver disease. There is no evidence of portal hypertension. Secondary liver disease workup revealed weakly positive anti-smooth muscle antibodies. She does not have hep C. Status post liver biopsy.  - I switched from entecavir to viread 300mg  daily to decrease the risk for development of drug resistance, however patient had stomach upset which was transient, continues to have myalgias.  I will switch her back to entecavir Labs to be monitored while she is on therapy for chronic hepatitis B infection  - LFTs, HBV DNA every 3 months until viral load undetected then every 6 months - Hepatitis B e antigen, hepatitis B e antibody  every 6 months - HBsAg annually - to avoid alcohol use, limit intake of NSAIDs and Tylenol use for arthralgias  Colon cancer screening: overdue Average risk Recommend colonoscopy and patient agreeable, she prefers to schedule it in January 2020 Labs ordered  Follow up in 6 months   [name], MD

## 2018-02-11 NOTE — Telephone Encounter (Signed)
Amy with [facility] called & would like clarification on patient's medications. She would like to know she is stopping the Tenofovir & starting the Entecavir. Please call to Amy @ [phone].

## 2018-02-11 NOTE — Telephone Encounter (Signed)
Spoke with [facility] pharmacy and prescription  has been signed verbally

## 2018-02-11 NOTE — Telephone Encounter (Signed)
Pt came back to office after her visit she would like a call she states [facility] is out of Network with her and they will not fill her rx she states she spoke with [facility] which is waiting on a signature

## 2018-02-12 LAB — HEPATITIS B E ANTIGEN: Hep B E Ag: NEGATIVE

## 2018-02-15 LAB — HEPATITIS B E ANTIBODY: Hep B E Ab: POSITIVE — AB

## 2018-02-15 NOTE — Telephone Encounter (Signed)
medication has been clarified, pt is no longer taking Tenofovir,

## 2018-02-17 ENCOUNTER — Telehealth: Payer: Self-pay | Admitting: Gastroenterology

## 2018-02-17 LAB — MISC LABCORP TEST (SEND OUT): LABCORP TEST CODE: 820201

## 2018-02-17 NOTE — Telephone Encounter (Signed)
Pt is calling for Lab results °

## 2018-02-18 NOTE — Telephone Encounter (Signed)
Patient has been notified of lab results and verbalized understanding. 

## 2018-02-21 ENCOUNTER — Ambulatory Visit (INDEPENDENT_AMBULATORY_CARE_PROVIDER_SITE_OTHER)
Admission: RE | Admit: 2018-02-21 | Discharge: 2018-02-21 | Disposition: A | Discharge status: Home / Self Care | Payer: BLUE CROSS/BLUE SHIELD | Source: Ambulatory Visit | Attending: Internal Medicine | Admitting: Internal Medicine

## 2018-02-21 ENCOUNTER — Encounter: Payer: Self-pay | Admitting: Internal Medicine

## 2018-02-21 ENCOUNTER — Ambulatory Visit (INDEPENDENT_AMBULATORY_CARE_PROVIDER_SITE_OTHER): Payer: BLUE CROSS/BLUE SHIELD | Admitting: Internal Medicine

## 2018-02-21 VITALS — BP 122/80 | HR 70 | Temp 98.0°F | Wt 196.0 lb

## 2018-02-21 DIAGNOSIS — R0781 Pleurodynia: Secondary | ICD-10-CM | POA: Diagnosis not present

## 2018-02-21 NOTE — Progress Notes (Signed)
Subjective:    Patient ID: Diana [name], female    DOB: 04/11/1961, 57 y.o.   MRN: [id number]  HPI  Pt presents to the clinic today with c/o bilateral rib pain. She describes the pain as sore and achy, but can be sharp with certain movements. The pain is not worse with taking a deep breath or cough. It seems worse with sitting and laying down. She is having worsening reflux, due to bad changes in her diet. She is taking Omeprazole daily. She denies nausea, vomiting, constipation, diarrhea, abdominal pain or blood in her stool. She denies any injury to the area. She is currently being treated for Hep B with antivirals, which she just changed 3 days ago, but this has not changed her symptoms. She has not taken anything OTC for her symptoms.  Review of Systems      Past Medical History:  Diagnosis Date  . Allergy   . Anxiety   . Ear infection    recurrent  . Hypertension     Current Outpatient Medications  Medication Sig Dispense Refill  . cetirizine (ZYRTEC) 10 MG tablet Take 10 mg by mouth daily.    . clonazePAM (KLONOPIN) 0.5 MG tablet TAKE 1 TABLET BY MOUTH DAILY AS NEEDED FOR ANXIETY 30 tablet 0  . cyclobenzaprine (FLEXERIL) 5 MG tablet Take 1 tablet (5 mg total) by mouth at bedtime as needed for muscle spasms. (Patient not taking: Reported on 01/31/2018) 20 tablet 0  . entecavir (BARACLUDE) 0.5 MG tablet Take 1 tablet (0.5 mg total) by mouth daily. 90 tablet 0  . hydrochlorothiazide (MICROZIDE) 12.5 MG capsule Take 1 capsule (12.5 mg total) by mouth daily. 30 capsule 10  . omeprazole (PRILOSEC) 20 MG capsule TAKE 1 CAPSULE BY MOUTH EVERY DAY 30 capsule 0  . potassium chloride (K-DUR) 10 MEQ tablet Take 2 tablets in the morning and 1 tablet in the evening. 90 tablet 2   No current facility-administered medications for this visit.     No Known Allergies  Family History  Problem Relation Age of Onset  . Hypertension Mother        alive  . Arthritis Mother   . Lung cancer  Father        deceased  . Heart disease Father   . Other Unknown        1 brother and 1 sister both alive an healthy  . Hypertension Maternal Grandmother     Social History   Socioeconomic History  . Marital status: Married    Spouse name: Not on file  . Number of children: 3  . Years of education: Not on file  . Highest education level: Not on file  Occupational History  . Occupation: Nutritional therapist: [employer]  Social Needs  . Financial resource strain: Not on file  . Food insecurity:    Worry: Not on file    Inability: Not on file  . Transportation needs:    Medical: Not on file    Non-medical: Not on file  Tobacco Use  . Smoking status: Former Smoker    Types: Cigarettes    Last attempt to quit: 04/20/1985    Years since quitting: 32.8  . Smokeless tobacco: Never Used  . Tobacco comment: remote tobacco use   Substance and Sexual Activity  . Alcohol use: No    Alcohol/week: 0.0 standard drinks  . Drug use: No  . Sexual activity: Never  Lifestyle  . Physical  activity:    Days per week: Not on file    Minutes per session: Not on file  . Stress: Not on file  Relationships  . Social connections:    Talks on phone: Not on file    Gets together: Not on file    Attends religious service: Not on file    Active member of club or organization: Not on file    Attends meetings of clubs or organizations: Not on file    Relationship status: Not on file  . Intimate partner violence:    Fear of current or ex partner: Not on file    Emotionally abused: Not on file    Physically abused: Not on file    Forced sexual activity: Not on file  Other Topics Concern  . Not on file  Social History Narrative  . Not on file     Constitutional: Denies fever, malaise, fatigue, headache or abrupt weight changes.  HEENT: Denies eye pain, eye redness, ear pain, ringing in the ears, wax buildup, runny nose, nasal congestion, bloody nose, or sore throat. Respiratory:  Pt reports cough. Denies difficulty breathing, shortness of breath, cough or sputum production.   Cardiovascular: Denies chest pain, chest tightness, palpitations or swelling in the hands or feet.  Gastrointestinal: Denies abdominal pain, bloating, constipation, diarrhea or blood in the stool.  GU: Denies urgency, frequency, pain with urination, burning sensation, blood in urine, odor or discharge. Musculoskeletal: Pt reports bilateral rib pain. Denies decrease in range of motion, difficulty with gait, muscle pain or joint swelling.  Skin: Denies redness, rashes, lesions or ulcercations.    No other specific complaints in a complete review of systems (except as listed in HPI above).  Objective:   Physical Exam   BP 122/80   Pulse 70   Temp 98 F (36.7 C) (Oral)   Wt 196 lb (88.9 kg)   SpO2 98%   BMI 31.64 kg/m  Wt Readings from Last 3 Encounters:  02/21/18 196 lb (88.9 kg)  02/11/18 197 lb 12.8 oz (89.7 kg)  01/31/18 194 lb (88 kg)    General: Appears her stated age, obese, in NAD. Skin: Warm, dry and intact. No rashes noted. Cardiovascular: Normal rate and rhythm. S1,S2 noted.  No murmur, rubs or gallops noted.  Pulmonary/Chest: Normal effort and positive vesicular breath sounds. No respiratory distress. No wheezes, rales or ronchi noted.  Abdomen: Soft and mildly tender in the LUQ. Normal bowel sounds. No distention or masses noted.  Musculoskeletal: Normal flexion, extension and rotation of the spine. Bilateral lateral and anterior lower ribs mildly tender with palpation but no noticeable deformity noted.  Neurological: Alert and oriented.    BMET    Component Value Date/Time   NA 138 07/27/2017 2153   K 3.5 07/27/2017 2153   CL 103 07/27/2017 2153   CO2 29 07/27/2017 2153   GLUCOSE 120 (H) 07/27/2017 2153   BUN 14 07/27/2017 2153   CREATININE 0.57 07/27/2017 2153   CALCIUM 8.7 (L) 07/27/2017 2153   GFRNONAA >60 07/27/2017 2153   GFRAA >60 07/27/2017 2153     Lipid Panel     Component Value Date/Time   CHOL 98 06/30/2017 1453   TRIG 123.0 06/30/2017 1453   HDL 13.90 (L) 06/30/2017 1453   CHOLHDL 7 06/30/2017 1453   VLDL 24.6 06/30/2017 1453   LDLCALC 60 06/30/2017 1453    CBC    Component Value Date/Time   WBC 3.5 (L) 07/27/2017 1023   RBC 4.60  07/27/2017 1023   HGB 13.7 07/27/2017 1023   HCT 40.0 07/27/2017 1023   PLT 191 07/27/2017 1023   MCV 86.9 07/27/2017 1023   MCH 29.7 07/27/2017 1023   MCHC 34.2 07/27/2017 1023   RDW 14.8 (H) 07/27/2017 1023   LYMPHSABS 1.1 07/28/2016 1232   MONOABS 0.4 07/28/2016 1232   EOSABS 0.0 07/28/2016 1232   BASOSABS 0.0 07/28/2016 1232    Hgb A1C Lab Results  Component Value Date   HGBA1C 5.9 06/09/2016           Assessment & Plan:   Bilateral Rib Pain:  Likely muscular vs costochondritis Chest xray today for further evaluation HFP reviewed- liver enzymes improving Thought it could be a side effect from her medications, but not an apparent adverse reaction on UTD Advised her to try heat, stretching and Ibuprofen 600 mg BID prn to reduce inflammation  Will follow up after xray, return precautions discussed [name], NP

## 2018-02-21 NOTE — Patient Instructions (Signed)
Chest Wall Pain °Chest wall pain is pain in or around the bones and muscles of your chest. Sometimes, an injury causes this pain. Sometimes, the cause may not be known. This pain may take several weeks or longer to get better. °Follow these instructions at home: °Pay attention to any changes in your symptoms. Take these actions to help with your pain: °· Rest as told by your doctor. °· Avoid activities that cause pain. Try not to use your chest, belly (abdominal), or side muscles to lift heavy things. °· If directed, apply ice to the painful area: °? Put ice in a plastic bag. °? Place a towel between your skin and the bag. °? Leave the ice on for 20 minutes, 2-3 times per day. °· Take over-the-counter and prescription medicines only as told by your doctor. °· Do not use tobacco products, including cigarettes, chewing tobacco, and e-cigarettes. If you need help quitting, ask your doctor. °· Keep all follow-up visits as told by your doctor. This is important. ° °Contact a doctor if: °· You have a fever. °· Your chest pain gets worse. °· You have new symptoms. °Get help right away if: °· You feel sick to your stomach (nauseous) or you throw up (vomit). °· You feel sweaty or light-headed. °· You have a cough with phlegm (sputum) or you cough up blood. °· You are short of breath. °This information is not intended to replace advice given to you by your health care provider. Make sure you discuss any questions you have with your health care provider. °Document Released: 09/23/2007 Document Revised: 09/12/2015 Document Reviewed: 07/02/2014 °Elsevier Interactive Patient Education © 2018 Elsevier Inc. ° °

## 2018-02-23 DIAGNOSIS — N930 Postcoital and contact bleeding: Secondary | ICD-10-CM | POA: Diagnosis not present

## 2018-02-23 DIAGNOSIS — S3141XA Laceration without foreign body of vagina and vulva, initial encounter: Secondary | ICD-10-CM | POA: Diagnosis not present

## 2018-02-24 ENCOUNTER — Telehealth: Payer: Self-pay | Admitting: Gastroenterology

## 2018-02-24 NOTE — Telephone Encounter (Signed)
Pt is calling she states she spoke with Dr. [place] Callas nurse Monday and she couldn't answer a question regarding her test results and was going to ask Dr. Allegra Lai and get back with her she is calling regarding this

## 2018-02-24 NOTE — Telephone Encounter (Signed)
Dr. Allegra Lai please call pt concerning lab results pt wants to know why antigen is negative and hep b antibody is positive and wants to know the difference, please advise

## 2018-03-08 ENCOUNTER — Ambulatory Visit (INDEPENDENT_AMBULATORY_CARE_PROVIDER_SITE_OTHER)
Admission: RE | Admit: 2018-03-08 | Discharge: 2018-03-08 | Disposition: A | Discharge status: Home / Self Care | Payer: BLUE CROSS/BLUE SHIELD | Source: Ambulatory Visit | Attending: Internal Medicine | Admitting: Internal Medicine

## 2018-03-08 ENCOUNTER — Ambulatory Visit (INDEPENDENT_AMBULATORY_CARE_PROVIDER_SITE_OTHER): Payer: BLUE CROSS/BLUE SHIELD | Admitting: Internal Medicine

## 2018-03-08 ENCOUNTER — Encounter: Payer: Self-pay | Admitting: Internal Medicine

## 2018-03-08 VITALS — BP 120/78 | HR 66 | Temp 98.1°F | Wt 197.0 lb

## 2018-03-08 DIAGNOSIS — R2232 Localized swelling, mass and lump, left upper limb: Secondary | ICD-10-CM

## 2018-03-08 DIAGNOSIS — M79642 Pain in left hand: Secondary | ICD-10-CM

## 2018-03-08 DIAGNOSIS — Z113 Encounter for screening for infections with a predominantly sexual mode of transmission: Secondary | ICD-10-CM | POA: Diagnosis not present

## 2018-03-08 MED ORDER — PREDNISONE 10 MG PO TABS: ORAL_TABLET | ORAL | 0 refills | Status: DC
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
## 2018-03-08 NOTE — Patient Instructions (Signed)
Hand Pain  Many things can cause hand pain. Some common causes are:  ? An injury.  ? Repeating the same movement with your hand over and over (overuse).  ? Osteoporosis.  ? Arthritis.  ? Lumps in the tendons or joints of the hand and wrist (ganglion cysts).  ? Infection.  Follow these instructions at home:  Pay attention to any changes in your symptoms. Take these actions to help with your discomfort:  ? If directed, put ice on the affected area:  ? Put ice in a plastic bag.  ? Place a towel between your skin and the bag.  ? Leave the ice on for 15?20 minutes, 3?4 times a day for 2 days.  ? Take over-the-counter and prescription medicines only as told by your health care provider.  ? Minimize stress on your hands and wrists as much as possible.  ? Take breaks from repetitive activity often.  ? Do stretches as told by your health care provider.  ? Do not do activities that make your pain worse.  Contact a health care provider if:  ? Your pain does not get better after a few days of self-care.  ? Your pain gets worse.  ? Your pain affects your ability to do your daily activities.  Get help right away if:  ? Your hand becomes warm, red, or swollen.  ? Your hand is numb or tingling.  ? Your hand is extremely swollen or deformed.  ? Your hand or fingers turn white or blue.  ? You cannot move your hand, wrist, or fingers.  This information is not intended to replace advice given to you by your health care provider. Make sure you discuss any questions you have with your health care provider.  Document Released: 05/03/2015 Document Revised: 09/12/2015 Document Reviewed: 05/02/2014  Elsevier Interactive Patient Education ? 2018 Elsevier Inc.

## 2018-03-09 ENCOUNTER — Other Ambulatory Visit: Payer: Self-pay | Admitting: Internal Medicine

## 2018-03-09 LAB — RPR: RPR Ser Ql: NONREACTIVE

## 2018-03-09 LAB — HIV ANTIBODY (ROUTINE TESTING W REFLEX): HIV: NONREACTIVE

## 2018-03-10 ENCOUNTER — Other Ambulatory Visit: Payer: Self-pay | Admitting: Internal Medicine

## 2018-03-10 DIAGNOSIS — M79642 Pain in left hand: Secondary | ICD-10-CM

## 2018-03-22 ENCOUNTER — Encounter: Payer: Self-pay | Admitting: Internal Medicine

## 2018-03-22 ENCOUNTER — Ambulatory Visit (INDEPENDENT_AMBULATORY_CARE_PROVIDER_SITE_OTHER)
Admission: RE | Admit: 2018-03-22 | Discharge: 2018-03-22 | Disposition: A | Discharge status: Home / Self Care | Payer: BLUE CROSS/BLUE SHIELD | Source: Ambulatory Visit | Attending: Internal Medicine | Admitting: Internal Medicine

## 2018-03-22 ENCOUNTER — Telehealth: Payer: Self-pay | Admitting: Internal Medicine

## 2018-03-22 ENCOUNTER — Ambulatory Visit (INDEPENDENT_AMBULATORY_CARE_PROVIDER_SITE_OTHER): Payer: BLUE CROSS/BLUE SHIELD | Admitting: Internal Medicine

## 2018-03-22 VITALS — BP 120/78 | HR 70 | Temp 97.9°F | Wt 198.0 lb

## 2018-03-22 DIAGNOSIS — M545 Low back pain, unspecified: Secondary | ICD-10-CM

## 2018-03-22 DIAGNOSIS — S3992XA Unspecified injury of lower back, initial encounter: Secondary | ICD-10-CM | POA: Diagnosis not present

## 2018-03-22 DIAGNOSIS — W19XXXD Unspecified fall, subsequent encounter: Secondary | ICD-10-CM

## 2018-03-22 DIAGNOSIS — M546 Pain in thoracic spine: Secondary | ICD-10-CM

## 2018-03-22 NOTE — Telephone Encounter (Signed)
Fmla paperwork in [facility] in box

## 2018-03-22 NOTE — Telephone Encounter (Signed)
Done, given back to Robin 

## 2018-03-22 NOTE — Patient Instructions (Signed)

## 2018-03-22 NOTE — Progress Notes (Signed)
Subjective:    Patient ID: Diana [name], female    DOB: [birth year], 57 y.o.   MRN: [id number]  HPI  Pt presents to the clinic today with c/o back pain. She reports the pain is located in the middle of her back and radiates to the lumbar area. She describes the pain as burning with muscle tension mainly on the left side. The pain radiates into her legs but she denies loss of bowel or bladder. She denies numbness or tingling in her lower extremities. The pain is worse with position changes or with sitting, standing or laying for too long. She reports she fell 1 month ago at work, was being evaluated at urgent care per her employer but her workman's comp got denied.She reports they did xrays of her knee and shoulder but not her back. She was prescribed Diclofenac, Robaxin, Naproxyn and Baclofen which did not provide any relief. She is taking Ibuprofen and leftover narcotics from her previous hysterectomy which has provided some relief. She is requesting FMLA for short term disability and referral to ortho. MRI lumbar spine from 2016 showed:  IMPRESSION: 1. New left-sided disc extrusion at L4-5 likely affecting the left L4 nerve in the lateral recess and neural foramen. Similar appearance of moderate spinal stenosis and right lateral recess stenosis. 2. Interval retraction of L5-S1 disc extrusion on the prior study. Similar appearance of moderate bilateral lateral recess stenosis at the L5-S1 disc space level.  Review of Systems  Past Medical History:  Diagnosis Date  . Allergy   . Anxiety   . Ear infection    recurrent  . Hypertension     Current Outpatient Medications  Medication Sig Dispense Refill  . cetirizine (ZYRTEC) 10 MG tablet Take 10 mg by mouth daily.    . clonazePAM (KLONOPIN) 0.5 MG tablet TAKE 1 TABLET BY MOUTH DAILY AS NEEDED FOR ANXIETY 30 tablet 0  . entecavir (BARACLUDE) 0.5 MG tablet Take 1 tablet (0.5 mg total) by mouth daily. 90 tablet 0  . hydrochlorothiazide  (MICROZIDE) 12.5 MG capsule Take 1 capsule (12.5 mg total) by mouth daily. 30 capsule 10  . omeprazole (PRILOSEC) 20 MG capsule TAKE 1 CAPSULE BY MOUTH EVERY DAY 30 capsule 3  . potassium chloride (K-DUR) 10 MEQ tablet Take 2 tablets in the morning and 1 tablet in the evening. 90 tablet 2  . predniSONE (DELTASONE) 10 MG tablet Take 6 tabs day 1, 5 tabs day 2, 4 tabs day 3, 3 tabs day 4, 2 tabs day 5, 1 tab day 6 21 tablet 0   No current facility-administered medications for this visit.     No Known Allergies  Family History  Problem Relation Age of Onset  . Hypertension Mother        alive  . Arthritis Mother   . Lung cancer Father        deceased  . Heart disease Father   . Other Unknown        1 brother and 1 sister both alive an healthy  . Hypertension Maternal Grandmother     Social History   Socioeconomic History  . Marital status: Married    Spouse name: Not on file  . Number of children: 3  . Years of education: Not on file  . Highest education level: Not on file  Occupational History  . Occupation: Nutritional therapist: [employer]  Social Needs  . Financial resource strain: Not on file  . Food  insecurity:    Worry: Not on file    Inability: Not on file  . Transportation needs:    Medical: Not on file    Non-medical: Not on file  Tobacco Use  . Smoking status: Former Smoker    Types: Cigarettes    Last attempt to quit: 04/20/1985    Years since quitting: 32.9  . Smokeless tobacco: Never Used  . Tobacco comment: remote tobacco use   Substance and Sexual Activity  . Alcohol use: No    Alcohol/week: 0.0 standard drinks  . Drug use: No  . Sexual activity: Never  Lifestyle  . Physical activity:    Days per week: Not on file    Minutes per session: Not on file  . Stress: Not on file  Relationships  . Social connections:    Talks on phone: Not on file    Gets together: Not on file    Attends religious service: Not on file    Active member of  club or organization: Not on file    Attends meetings of clubs or organizations: Not on file    Relationship status: Not on file  . Intimate partner violence:    Fear of current or ex partner: Not on file    Emotionally abused: Not on file    Physically abused: Not on file    Forced sexual activity: Not on file  Other Topics Concern  . Not on file  Social History Narrative  . Not on file     Constitutional: Denies fever, malaise, fatigue, headache or abrupt weight changes.  Musculoskeletal: Pt reports mid-lower back pain. Denies decrease in range of motion, difficulty with gait, or joint swelling.   Neurological: Denies numbness or tingling in lower extremities or problems with balance and coordination.    No other specific complaints in a complete review of systems (except as listed in HPI above).     Objective:   Physical Exam    BP 120/78   Pulse 70   Temp 97.9 F (36.6 C) (Oral)   Wt 198 lb (89.8 kg)   SpO2 98%   BMI 31.96 kg/m  Wt Readings from Last 3 Encounters:  03/22/18 198 lb (89.8 kg)  03/08/18 197 lb (89.4 kg)  02/21/18 196 lb (88.9 kg)    General: Appears her stated age, obese in NAD. Musculoskeletal: Normal extension and rotation of the spine. Pain with flexion. Bony tenderness noted over thoracic and lumbar spine. Strength 5/5 BLE. No difficulty with gait.  Neurological: Alert and oriented. Negative SLR. Sensation intact to BLE.   BMET    Component Value Date/Time   NA 138 07/27/2017 2153   K 3.5 07/27/2017 2153   CL 103 07/27/2017 2153   CO2 29 07/27/2017 2153   GLUCOSE 120 (H) 07/27/2017 2153   BUN 14 07/27/2017 2153   CREATININE 0.57 07/27/2017 2153   CALCIUM 8.7 (L) 07/27/2017 2153   GFRNONAA >60 07/27/2017 2153   GFRAA >60 07/27/2017 2153    Lipid Panel     Component Value Date/Time   CHOL 98 06/30/2017 1453   TRIG 123.0 06/30/2017 1453   HDL 13.90 (L) 06/30/2017 1453   CHOLHDL 7 06/30/2017 1453   VLDL 24.6 06/30/2017 1453    LDLCALC 60 06/30/2017 1453    CBC    Component Value Date/Time   WBC 3.5 (L) 07/27/2017 1023   RBC 4.60 07/27/2017 1023   HGB 13.7 07/27/2017 1023   HCT 40.0 07/27/2017 1023   PLT  191 07/27/2017 1023   MCV 86.9 07/27/2017 1023   MCH 29.7 07/27/2017 1023   MCHC 34.2 07/27/2017 1023   RDW 14.8 (H) 07/27/2017 1023   LYMPHSABS 1.1 07/28/2016 1232   MONOABS 0.4 07/28/2016 1232   EOSABS 0.0 07/28/2016 1232   BASOSABS 0.0 07/28/2016 1232    Hgb A1C Lab Results  Component Value Date   HGBA1C 5.9 06/09/2016          Assessment & Plan:   Acute Thoracic/Lumbar Back Pain s/p Fall at Work:  Will obtain xrays of thoracic and lumbar region Will request UC notes, imaging that relates to this issue Continue Naproxysn for now Encouraged stretching, heat and massage Consider PT Referral placed to ortho per pt request FMLA to be completed and faxed back (out of work since 10/31, no plans to return until cleared by ortho)  Will follow up after xrays, return precautions discussed [name], NP

## 2018-03-23 NOTE — Telephone Encounter (Signed)
Pt aware  °Copy for pt °Copy for scan °

## 2018-03-23 NOTE — Telephone Encounter (Signed)
Paperwork faxed °

## 2018-03-24 ENCOUNTER — Telehealth: Payer: Self-pay | Admitting: Internal Medicine

## 2018-03-24 DIAGNOSIS — S3141XD Laceration without foreign body of vagina and vulva, subsequent encounter: Secondary | ICD-10-CM | POA: Diagnosis not present

## 2018-03-24 NOTE — Telephone Encounter (Signed)
Paperwork in [facility] in box °For review and signature °

## 2018-03-24 NOTE — Telephone Encounter (Signed)
Paperwork faxed °

## 2018-03-24 NOTE — Telephone Encounter (Signed)
Signed, given back to Robin 

## 2018-03-24 NOTE — Telephone Encounter (Signed)
Pt dropped off additional paperwork from [employer]

## 2018-03-25 DIAGNOSIS — M545 Low back pain: Secondary | ICD-10-CM | POA: Diagnosis not present

## 2018-03-25 DIAGNOSIS — H16143 Punctate keratitis, bilateral: Secondary | ICD-10-CM | POA: Diagnosis not present

## 2018-03-29 NOTE — Telephone Encounter (Signed)
Pt aware  °Copy for pt °Copy for scan °

## 2018-03-31 ENCOUNTER — Ambulatory Visit: Payer: BLUE CROSS/BLUE SHIELD | Admitting: Internal Medicine

## 2018-04-01 ENCOUNTER — Encounter: Payer: Self-pay | Admitting: Internal Medicine

## 2018-04-01 ENCOUNTER — Ambulatory Visit (INDEPENDENT_AMBULATORY_CARE_PROVIDER_SITE_OTHER): Payer: BLUE CROSS/BLUE SHIELD | Admitting: Internal Medicine

## 2018-04-01 VITALS — BP 124/82 | HR 71 | Temp 97.9°F | Wt 194.0 lb

## 2018-04-01 DIAGNOSIS — R0781 Pleurodynia: Secondary | ICD-10-CM | POA: Diagnosis not present

## 2018-04-01 DIAGNOSIS — B181 Chronic viral hepatitis B without delta-agent: Secondary | ICD-10-CM

## 2018-04-01 DIAGNOSIS — R102 Pelvic and perineal pain: Secondary | ICD-10-CM | POA: Diagnosis not present

## 2018-04-01 NOTE — Progress Notes (Signed)
Subjective:    Patient ID: Diana [name], female    DOB: Jul 02, 1960, 57 y.o.   MRN: [id number]  HPI  Pt presents to the clinic today with c/o bilateral rib pain. She reports this started shortly after she was started on antiviral medication. The pain is intermittent. She describes the pain as sore and achy. The pain does not radiate. It is tender to touch, but is not worse with movement or taking a deep breath. She has not noticed any rashes. She denies any injury to the area. She recently had xrays of ribs at [facility], which was normal. She also reports her abdomen is sore. She noticed this a few days ago. She reports some waistbands of her pants are bothering her, almost like they are too tight, but she has not gained weight. She denies cough, nausea, vomiting, diarrhea, constipation or blood in her stool. She has not tried OTC meds. She is taking Methocarbamol and Naproxen with minimal relief. She is currently seeing ortho for back pain, left hand pain.  She would also like STD screening today.  Review of Systems  Past Medical History:  Diagnosis Date  . Allergy   . Anxiety   . Ear infection    recurrent  . Hypertension     Current Outpatient Medications  Medication Sig Dispense Refill  . cetirizine (ZYRTEC) 10 MG tablet Take 10 mg by mouth daily.    . clonazePAM (KLONOPIN) 0.5 MG tablet TAKE 1 TABLET BY MOUTH DAILY AS NEEDED FOR ANXIETY 30 tablet 0  . entecavir (BARACLUDE) 0.5 MG tablet Take 1 tablet (0.5 mg total) by mouth daily. 90 tablet 0  . hydrochlorothiazide (MICROZIDE) 12.5 MG capsule Take 1 capsule (12.5 mg total) by mouth daily. 30 capsule 10  . omeprazole (PRILOSEC) 20 MG capsule TAKE 1 CAPSULE BY MOUTH EVERY DAY 30 capsule 3  . potassium chloride (K-DUR) 10 MEQ tablet Take 2 tablets in the morning and 1 tablet in the evening. 90 tablet 2   No current facility-administered medications for this visit.     No Known Allergies  Family History  Problem Relation Age  of Onset  . Hypertension Mother        alive  . Arthritis Mother   . Lung cancer Father        deceased  . Heart disease Father   . Other Unknown        1 brother and 1 sister both alive an healthy  . Hypertension Maternal Grandmother     Social History   Socioeconomic History  . Marital status: Married    Spouse name: Not on file  . Number of children: 3  . Years of education: Not on file  . Highest education level: Not on file  Occupational History  . Occupation: Nutritional therapist: [employer]  Social Needs  . Financial resource strain: Not on file  . Food insecurity:    Worry: Not on file    Inability: Not on file  . Transportation needs:    Medical: Not on file    Non-medical: Not on file  Tobacco Use  . Smoking status: Former Smoker    Types: Cigarettes    Last attempt to quit: 04/20/1985    Years since quitting: 32.9  . Smokeless tobacco: Never Used  . Tobacco comment: remote tobacco use   Substance and Sexual Activity  . Alcohol use: No    Alcohol/week: 0.0 standard drinks  . Drug  use: No  . Sexual activity: Never  Lifestyle  . Physical activity:    Days per week: Not on file    Minutes per session: Not on file  . Stress: Not on file  Relationships  . Social connections:    Talks on phone: Not on file    Gets together: Not on file    Attends religious service: Not on file    Active member of club or organization: Not on file    Attends meetings of clubs or organizations: Not on file    Relationship status: Not on file  . Intimate partner violence:    Fear of current or ex partner: Not on file    Emotionally abused: Not on file    Physically abused: Not on file    Forced sexual activity: Not on file  Other Topics Concern  . Not on file  Social History Narrative  . Not on file     Constitutional: Denies fever, malaise, fatigue, headache or abrupt weight changes.  Respiratory: Denies difficulty breathing, shortness of breath, cough  or sputum production.   Cardiovascular: Denies chest pain, chest tightness, palpitations or swelling in the hands or feet.  Gastrointestinal: Pt reports abdominal pain. Denies bloating, constipation, diarrhea or blood in the stool.  GU: Denies urgency, frequency, pain with urination, burning sensation, blood in urine, odor or discharge. Musculoskeletal: Pt reports bilateral rib pain. Denies decrease in range of motion, difficulty with gait, or joint swelling.  Skin: Denies redness, rashes, lesions or ulcercations.   No other specific complaints in a complete review of systems (except as listed in HPI above).     Objective:   Physical Exam   BP 124/82   Pulse 71   Temp 97.9 F (36.6 C) (Oral)   Wt 194 lb (88 kg)   SpO2 98%   BMI 31.31 kg/m  Wt Readings from Last 3 Encounters:  04/01/18 194 lb (88 kg)  03/22/18 198 lb (89.8 kg)  03/08/18 197 lb (89.4 kg)    General: Appears her stated age, obese, in NAD. Skin: Warm, dry and intact. No rashes noted. Pulmonary/Chest: Normal effort and positive vesicular breath sounds. No respiratory distress. No wheezes, rales or ronchi noted.  Abdomen: Soft and tender in bilateral lower quadrants. Normal bowel sounds. No distention or masses noted.  Musculoskeletal: Bilateral lower anterior ribs tender with palpation.   BMET    Component Value Date/Time   NA 138 07/27/2017 2153   K 3.5 07/27/2017 2153   CL 103 07/27/2017 2153   CO2 29 07/27/2017 2153   GLUCOSE 120 (H) 07/27/2017 2153   BUN 14 07/27/2017 2153   CREATININE 0.57 07/27/2017 2153   CALCIUM 8.7 (L) 07/27/2017 2153   GFRNONAA >60 07/27/2017 2153   GFRAA >60 07/27/2017 2153    Lipid Panel     Component Value Date/Time   CHOL 98 06/30/2017 1453   TRIG 123.0 06/30/2017 1453   HDL 13.90 (L) 06/30/2017 1453   CHOLHDL 7 06/30/2017 1453   VLDL 24.6 06/30/2017 1453   LDLCALC 60 06/30/2017 1453    CBC    Component Value Date/Time   WBC 3.5 (L) 07/27/2017 1023   RBC 4.60  07/27/2017 1023   HGB 13.7 07/27/2017 1023   HCT 40.0 07/27/2017 1023   PLT 191 07/27/2017 1023   MCV 86.9 07/27/2017 1023   MCH 29.7 07/27/2017 1023   MCHC 34.2 07/27/2017 1023   RDW 14.8 (H) 07/27/2017 1023   LYMPHSABS 1.1 07/28/2016 1232  MONOABS 0.4 07/28/2016 1232   EOSABS 0.0 07/28/2016 1232   BASOSABS 0.0 07/28/2016 1232    Hgb A1C Lab Results  Component Value Date   HGBA1C 5.9 06/09/2016           Assessment & Plan:   Bilateral Rib Pain:  Xray ribs negative per her report Continue Naproxen and Methocarbamol Will obtain ultrasound RUQ looking for hepatomegaly associated with Chronic Hep B  Bilateral Pelvic Pain:  Urine gonorrhea, chlamydia and trich Ultrasound pelvic/transvaginal ordered  Will follow up after labs and ultrasounds, return precautions disused [name], NP

## 2018-04-02 LAB — TRICHOMONAS VAGINALIS, PROBE AMP: TRICHOMONAS VAGINALIS RNA: NOT DETECTED

## 2018-04-02 LAB — C. TRACHOMATIS/N. GONORRHOEAE RNA
C. trachomatis RNA, TMA: NOT DETECTED
N. gonorrhoeae RNA, TMA: NOT DETECTED

## 2018-04-02 NOTE — Patient Instructions (Signed)
Chest Wall Pain °Chest wall pain is pain in or around the bones and muscles of your chest. Sometimes, an injury causes this pain. Sometimes, the cause may not be known. This pain may take several weeks or longer to get better. °Follow these instructions at home: °Pay attention to any changes in your symptoms. Take these actions to help with your pain: °· Rest as told by your doctor. °· Avoid activities that cause pain. Try not to use your chest, belly (abdominal), or side muscles to lift heavy things. °· If directed, apply ice to the painful area: °? Put ice in a plastic bag. °? Place a towel between your skin and the bag. °? Leave the ice on for 20 minutes, 2-3 times per day. °· Take over-the-counter and prescription medicines only as told by your doctor. °· Do not use tobacco products, including cigarettes, chewing tobacco, and e-cigarettes. If you need help quitting, ask your doctor. °· Keep all follow-up visits as told by your doctor. This is important. ° °Contact a doctor if: °· You have a fever. °· Your chest pain gets worse. °· You have new symptoms. °Get help right away if: °· You feel sick to your stomach (nauseous) or you throw up (vomit). °· You feel sweaty or light-headed. °· You have a cough with phlegm (sputum) or you cough up blood. °· You are short of breath. °This information is not intended to replace advice given to you by your health care provider. Make sure you discuss any questions you have with your health care provider. °Document Released: 09/23/2007 Document Revised: 09/12/2015 Document Reviewed: 07/02/2014 °Elsevier Interactive Patient Education © 2018 Elsevier Inc. ° °

## 2018-04-05 ENCOUNTER — Ambulatory Visit
Admission: RE | Admit: 2018-04-05 | Discharge: 2018-04-05 | Disposition: A | Discharge status: Home / Self Care | Payer: BLUE CROSS/BLUE SHIELD | Source: Ambulatory Visit | Attending: Internal Medicine | Admitting: Internal Medicine

## 2018-04-05 DIAGNOSIS — R102 Pelvic and perineal pain: Secondary | ICD-10-CM | POA: Diagnosis not present

## 2018-04-06 ENCOUNTER — Other Ambulatory Visit: Payer: Self-pay

## 2018-04-06 ENCOUNTER — Other Ambulatory Visit: Payer: Self-pay | Admitting: Internal Medicine

## 2018-04-06 ENCOUNTER — Telehealth: Payer: Self-pay | Admitting: Internal Medicine

## 2018-04-06 DIAGNOSIS — R0781 Pleurodynia: Secondary | ICD-10-CM

## 2018-04-06 DIAGNOSIS — B181 Chronic viral hepatitis B without delta-agent: Secondary | ICD-10-CM

## 2018-04-06 NOTE — Telephone Encounter (Signed)
Pt fell on 02/17/18 and was seen at [facility] and was given methocarbamol 500 mg tabs taking 1-2 tabs po q8h prn for lower back pain that radiated down rt leg.pt was given # 30 on 02/28/18 by [facility]. Pt has 6 tabs left. [facility] and request refill methocarbamol to [facility]. Pt said she has to take 2 of the methocarbamol since 04/03/18 to help the pain and averages taking bid.Please advise.

## 2018-04-06 NOTE — Addendum Note (Signed)
Addended by: Lorre MunroeBAITY, REGINA W on: 04/06/2018 12:31 PM   Modules accepted: Orders

## 2018-04-06 NOTE — Telephone Encounter (Signed)
She saw ortho for this. I think they gave her Baclofen instead. She needs to use what was recommend by ortho.

## 2018-04-06 NOTE — Progress Notes (Signed)
RUQ 

## 2018-04-06 NOTE — Telephone Encounter (Signed)
[facility] says because you used Pelvic Pain as the Primary DX code for RUQ Abd US you need to cancel this order and Reorder a New one without Pelvic Pain as DX. Choose [facility]. Please place New order.

## 2018-04-06 NOTE — Telephone Encounter (Signed)
done

## 2018-04-08 ENCOUNTER — Telehealth: Payer: Self-pay | Admitting: Internal Medicine

## 2018-04-08 MED ORDER — METHOCARBAMOL 500 MG PO TABS: 1000.0000 mg | ORAL_TABLET | Freq: Two times a day (BID) | ORAL | 0 refills | Status: DC | PRN

## 2018-04-08 NOTE — Addendum Note (Signed)
Addended by: Lorre MunroeBAITY, Liliana Dang W on: 04/08/2018 11:00 AM   Modules accepted: Orders

## 2018-04-08 NOTE — Telephone Encounter (Signed)
Robaxin refilled

## 2018-04-08 NOTE — Telephone Encounter (Signed)
Spoke to pt who states she did not receive baclofen from ortho, but instead, all medications came from [facility]. Pt states she has been taking baclofen with no relief. pls advise

## 2018-04-08 NOTE — Telephone Encounter (Signed)
[facility] from [facility]. Pt wants to speak about her back issues. Pt wanted to come in Monday but didn't see a 30 minute slot available. Best cb # [phone]

## 2018-04-11 ENCOUNTER — Ambulatory Visit
Admission: RE | Admit: 2018-04-11 | Discharge: 2018-04-11 | Disposition: A | Discharge status: Home / Self Care | Payer: BLUE CROSS/BLUE SHIELD | Source: Ambulatory Visit | Attending: Internal Medicine | Admitting: Internal Medicine

## 2018-04-11 DIAGNOSIS — B181 Chronic viral hepatitis B without delta-agent: Secondary | ICD-10-CM | POA: Diagnosis not present

## 2018-04-12 NOTE — Telephone Encounter (Signed)
Pt dropped off copy of email with needed info for short term disability requesting up to date info so that pt can continue to get paid while waiting on f/u with ortho on 04/25/2018... pt states the company has been unsuccessful in getting up to date info from ortho, requesting PCP provide info

## 2018-04-21 ENCOUNTER — Telehealth: Payer: Self-pay | Admitting: Internal Medicine

## 2018-04-21 NOTE — Telephone Encounter (Signed)
Pt is requesting a call from [facility] about paper work needing to be sent to her leave of absence coordinator.

## 2018-04-22 ENCOUNTER — Telehealth: Payer: Self-pay

## 2018-04-22 NOTE — Telephone Encounter (Signed)
Pt is in [place]; 04/18/18 pt was putting air in tire and had excruciating back pain. Pt went to ED there and was given flexeril 10 mg taking q 8 h, prednisone 20 mg taking 2 tabs daily and Naproxen 550 mg taking bid. Has not helped the pain; was dx sciatica. Pt went to chiropractor today and was advised had germaforma syndrome and would need stronger med than given at ED to get pt home but no Dr there will prescribe because [place]. Back pain is severe, pain level now 20+. Pt said has severe pain sitting,standing or laying. Pt is supposed to come home in car on 04/23/2018. Pt wants to know if R Baity NP will give her stronger med for back pain so she can get home in a car. Pt request cb. [facility] [place], [place]

## 2018-04-22 NOTE — Telephone Encounter (Signed)
No, if she is in that severe pain, she needs to go to the ER in [place]

## 2018-04-25 DIAGNOSIS — M5431 Sciatica, right side: Secondary | ICD-10-CM | POA: Diagnosis not present

## 2018-04-25 NOTE — Telephone Encounter (Signed)
Pt is aware as instructed and expressed understanding 

## 2018-05-02 DIAGNOSIS — M79604 Pain in right leg: Secondary | ICD-10-CM | POA: Diagnosis not present

## 2018-05-02 DIAGNOSIS — M545 Low back pain: Secondary | ICD-10-CM | POA: Diagnosis not present

## 2018-05-02 DIAGNOSIS — M2560 Stiffness of unspecified joint, not elsewhere classified: Secondary | ICD-10-CM | POA: Diagnosis not present

## 2018-05-02 DIAGNOSIS — M6281 Muscle weakness (generalized): Secondary | ICD-10-CM | POA: Diagnosis not present

## 2018-05-05 DIAGNOSIS — M545 Low back pain: Secondary | ICD-10-CM | POA: Diagnosis not present

## 2018-05-05 DIAGNOSIS — M2560 Stiffness of unspecified joint, not elsewhere classified: Secondary | ICD-10-CM | POA: Diagnosis not present

## 2018-05-05 DIAGNOSIS — M79604 Pain in right leg: Secondary | ICD-10-CM | POA: Diagnosis not present

## 2018-05-05 DIAGNOSIS — M6281 Muscle weakness (generalized): Secondary | ICD-10-CM | POA: Diagnosis not present

## 2018-05-10 ENCOUNTER — Other Ambulatory Visit: Payer: Self-pay | Admitting: Gastroenterology

## 2018-05-10 NOTE — Telephone Encounter (Signed)
Pt is calling she needs refill on rx Entecaeir  Send to mail order

## 2018-05-11 DIAGNOSIS — M2560 Stiffness of unspecified joint, not elsewhere classified: Secondary | ICD-10-CM | POA: Diagnosis not present

## 2018-05-11 DIAGNOSIS — M545 Low back pain: Secondary | ICD-10-CM | POA: Diagnosis not present

## 2018-05-11 DIAGNOSIS — M6281 Muscle weakness (generalized): Secondary | ICD-10-CM | POA: Diagnosis not present

## 2018-05-11 DIAGNOSIS — M79604 Pain in right leg: Secondary | ICD-10-CM | POA: Diagnosis not present

## 2018-05-13 ENCOUNTER — Telehealth: Payer: Self-pay | Admitting: Gastroenterology

## 2018-05-13 DIAGNOSIS — M2560 Stiffness of unspecified joint, not elsewhere classified: Secondary | ICD-10-CM | POA: Diagnosis not present

## 2018-05-13 DIAGNOSIS — M545 Low back pain: Secondary | ICD-10-CM | POA: Diagnosis not present

## 2018-05-13 DIAGNOSIS — M6281 Muscle weakness (generalized): Secondary | ICD-10-CM | POA: Diagnosis not present

## 2018-05-13 DIAGNOSIS — M79604 Pain in right leg: Secondary | ICD-10-CM | POA: Diagnosis not present

## 2018-05-13 NOTE — Telephone Encounter (Signed)
Pt is calling  Her rx is costing her $600  She would like a call reagrding this please

## 2018-05-17 DIAGNOSIS — M2560 Stiffness of unspecified joint, not elsewhere classified: Secondary | ICD-10-CM | POA: Diagnosis not present

## 2018-05-17 DIAGNOSIS — M545 Low back pain: Secondary | ICD-10-CM | POA: Diagnosis not present

## 2018-05-17 DIAGNOSIS — M79604 Pain in right leg: Secondary | ICD-10-CM | POA: Diagnosis not present

## 2018-05-17 DIAGNOSIS — M6281 Muscle weakness (generalized): Secondary | ICD-10-CM | POA: Diagnosis not present

## 2018-05-19 DIAGNOSIS — M6281 Muscle weakness (generalized): Secondary | ICD-10-CM | POA: Diagnosis not present

## 2018-05-19 DIAGNOSIS — M79604 Pain in right leg: Secondary | ICD-10-CM | POA: Diagnosis not present

## 2018-05-19 DIAGNOSIS — M545 Low back pain: Secondary | ICD-10-CM | POA: Diagnosis not present

## 2018-05-19 DIAGNOSIS — M2560 Stiffness of unspecified joint, not elsewhere classified: Secondary | ICD-10-CM | POA: Diagnosis not present

## 2018-05-21 DIAGNOSIS — M545 Low back pain: Secondary | ICD-10-CM | POA: Diagnosis not present

## 2018-05-23 NOTE — Telephone Encounter (Signed)
Pt needs to contact insurance plan concerning deductible not being met is why medication co-pay is high, pt has been notified and asked to please call her insurance plan.

## 2018-05-24 ENCOUNTER — Other Ambulatory Visit: Payer: Self-pay | Admitting: Internal Medicine

## 2018-05-24 DIAGNOSIS — M6281 Muscle weakness (generalized): Secondary | ICD-10-CM | POA: Diagnosis not present

## 2018-05-24 DIAGNOSIS — M79604 Pain in right leg: Secondary | ICD-10-CM | POA: Diagnosis not present

## 2018-05-24 DIAGNOSIS — M2560 Stiffness of unspecified joint, not elsewhere classified: Secondary | ICD-10-CM | POA: Diagnosis not present

## 2018-05-24 DIAGNOSIS — M545 Low back pain: Secondary | ICD-10-CM | POA: Diagnosis not present

## 2018-05-24 NOTE — Telephone Encounter (Signed)
Last filled 01/07/2018...Marland Kitchen please advise

## 2018-05-26 ENCOUNTER — Encounter: Payer: Self-pay | Admitting: Family Medicine

## 2018-05-26 ENCOUNTER — Ambulatory Visit (INDEPENDENT_AMBULATORY_CARE_PROVIDER_SITE_OTHER): Payer: BLUE CROSS/BLUE SHIELD | Admitting: Family Medicine

## 2018-05-26 DIAGNOSIS — J069 Acute upper respiratory infection, unspecified: Secondary | ICD-10-CM | POA: Diagnosis not present

## 2018-05-26 MED ORDER — LIDOCAINE VISCOUS HCL 2 % MT SOLN: 5.0000 mL | Freq: Four times a day (QID) | OROMUCOSAL | 0 refills | Status: DC | PRN

## 2018-05-26 NOTE — Progress Notes (Signed)
Sx started about 2-3 days ago.  Hoarse voice.  Sleep disrupted.  ST.  Cough is minimal.  No fevers.  No vomiting.  No diarrhea.  No rash.  No sputum. Some rhinorrhea, yellowish this AM.  No ear pain.    Meds, vitals, and allergies reviewed.   ROS: Per HPI unless specifically indicated in ROS section   GEN: nad, alert and oriented HEENT: mucous membranes moist, tm w/o erythema, nasal exam w/o erythema, clear discharge noted,  OP with cobblestoning NECK: supple w/o LA CV: rrr.   PULM: ctab, no inc wob EXT: no edema SKIN: no acute rash

## 2018-05-26 NOTE — Patient Instructions (Signed)
Likely a benign viral infection. Should resolve.  Rest your voice.  Rest and fluids.  Gargle with salt water.  If needed, use lidocaine for throat pain.  Take care.  Glad to see you.  Update [place] as needed.

## 2018-05-29 NOTE — Assessment & Plan Note (Addendum)
Likely a benign viral infection. Should resolve.  Rest voice.  Rest and fluids.  Gargle with salt water.  If needed, use lidocaine for throat pain.  Update [place] as needed.  She agrees to plan.

## 2018-05-31 DIAGNOSIS — M545 Low back pain: Secondary | ICD-10-CM | POA: Diagnosis not present

## 2018-05-31 DIAGNOSIS — M5126 Other intervertebral disc displacement, lumbar region: Secondary | ICD-10-CM | POA: Diagnosis not present

## 2018-06-01 DIAGNOSIS — M2560 Stiffness of unspecified joint, not elsewhere classified: Secondary | ICD-10-CM | POA: Diagnosis not present

## 2018-06-01 DIAGNOSIS — M79604 Pain in right leg: Secondary | ICD-10-CM | POA: Diagnosis not present

## 2018-06-01 DIAGNOSIS — M545 Low back pain: Secondary | ICD-10-CM | POA: Diagnosis not present

## 2018-06-01 DIAGNOSIS — M6281 Muscle weakness (generalized): Secondary | ICD-10-CM | POA: Diagnosis not present

## 2018-06-14 DIAGNOSIS — M545 Low back pain: Secondary | ICD-10-CM | POA: Diagnosis not present

## 2018-06-14 DIAGNOSIS — M2560 Stiffness of unspecified joint, not elsewhere classified: Secondary | ICD-10-CM | POA: Diagnosis not present

## 2018-06-14 DIAGNOSIS — M6281 Muscle weakness (generalized): Secondary | ICD-10-CM | POA: Diagnosis not present

## 2018-06-14 DIAGNOSIS — M79604 Pain in right leg: Secondary | ICD-10-CM | POA: Diagnosis not present

## 2018-06-21 DIAGNOSIS — M545 Low back pain: Secondary | ICD-10-CM | POA: Diagnosis not present

## 2018-06-21 DIAGNOSIS — M79604 Pain in right leg: Secondary | ICD-10-CM | POA: Diagnosis not present

## 2018-06-21 DIAGNOSIS — M6281 Muscle weakness (generalized): Secondary | ICD-10-CM | POA: Diagnosis not present

## 2018-06-21 DIAGNOSIS — M2560 Stiffness of unspecified joint, not elsewhere classified: Secondary | ICD-10-CM | POA: Diagnosis not present

## 2018-06-23 ENCOUNTER — Encounter: Payer: Self-pay | Admitting: Internal Medicine

## 2018-06-23 ENCOUNTER — Ambulatory Visit (INDEPENDENT_AMBULATORY_CARE_PROVIDER_SITE_OTHER): Payer: BLUE CROSS/BLUE SHIELD | Admitting: Internal Medicine

## 2018-06-23 VITALS — BP 124/82 | HR 73 | Temp 97.9°F | Wt 200.0 lb

## 2018-06-23 DIAGNOSIS — M25521 Pain in right elbow: Secondary | ICD-10-CM | POA: Diagnosis not present

## 2018-06-23 DIAGNOSIS — L2084 Intrinsic (allergic) eczema: Secondary | ICD-10-CM | POA: Diagnosis not present

## 2018-06-23 DIAGNOSIS — M25522 Pain in left elbow: Secondary | ICD-10-CM

## 2018-06-24 DIAGNOSIS — M545 Low back pain: Secondary | ICD-10-CM | POA: Diagnosis not present

## 2018-06-24 DIAGNOSIS — M79604 Pain in right leg: Secondary | ICD-10-CM | POA: Diagnosis not present

## 2018-06-24 DIAGNOSIS — M2560 Stiffness of unspecified joint, not elsewhere classified: Secondary | ICD-10-CM | POA: Diagnosis not present

## 2018-06-24 DIAGNOSIS — M6281 Muscle weakness (generalized): Secondary | ICD-10-CM | POA: Diagnosis not present

## 2018-06-28 ENCOUNTER — Encounter: Payer: Self-pay | Admitting: Internal Medicine

## 2018-06-28 DIAGNOSIS — M6281 Muscle weakness (generalized): Secondary | ICD-10-CM | POA: Diagnosis not present

## 2018-06-28 DIAGNOSIS — M545 Low back pain: Secondary | ICD-10-CM | POA: Diagnosis not present

## 2018-06-28 DIAGNOSIS — M2560 Stiffness of unspecified joint, not elsewhere classified: Secondary | ICD-10-CM | POA: Diagnosis not present

## 2018-06-28 DIAGNOSIS — M79604 Pain in right leg: Secondary | ICD-10-CM | POA: Diagnosis not present

## 2018-06-28 NOTE — Patient Instructions (Signed)

## 2018-06-28 NOTE — Progress Notes (Signed)
Subjective:    Patient ID: Diana [name], female    DOB: April 24, 1960, 58 y.o.   MRN: [id number]  HPI  Pt presents to the clinic today with c/o bilateral elbow pain. She reports this started 2 weeks ago. She describes the pain as achy. The pain does not radiate. The pain seems a little worse at night. She has not noticed any joint swelling. She denies any injury to the area. She has not taken anything OTC for her symptoms.  She also reports itching of her abdomen. This has been an ongoing issue. She has not noticed any rash. She does wear a girdle and is not sure if this is causing her to itch. She does not itch anywhere else. She denies changes in soaps, lotions or detergents. She denies changes in diet or medications. She has not come in contact with anything that she is allergic to. She has not tried anything OTC for this.  Review of Systems  Past Medical History:  Diagnosis Date  . Allergy   . Anxiety   . Ear infection    recurrent  . Hypertension     Current Outpatient Medications  Medication Sig Dispense Refill  . cetirizine (ZYRTEC) 10 MG tablet Take 10 mg by mouth daily.    . clonazePAM (KLONOPIN) 0.5 MG tablet TAKE 1 TABLET BY MOUTH DAILY AS NEEDED FOR ANXIETY 30 tablet 0  . diclofenac (VOLTAREN) 75 MG EC tablet   0  . entecavir (BARACLUDE) 0.5 MG tablet TAKE 1 TABLET BY MOUTH  EVERY DAY 30 tablet 1  . hydrochlorothiazide (MICROZIDE) 12.5 MG capsule Take 1 capsule (12.5 mg total) by mouth daily. 30 capsule 10  . lidocaine (XYLOCAINE) 2 % solution Use as directed 5 mLs in the mouth or throat every 6 (six) hours as needed for mouth pain (swish and spit). 100 mL 0  . methocarbamol (ROBAXIN) 500 MG tablet Take 2 tablets (1,000 mg total) by mouth 2 (two) times daily as needed for muscle spasms. 30 tablet 0  . omeprazole (PRILOSEC) 20 MG capsule TAKE 1 CAPSULE BY MOUTH EVERY DAY 30 capsule 3  . potassium chloride (K-DUR) 10 MEQ tablet Take 2 tablets in the morning and 1 tablet in  the evening. 90 tablet 2   No current facility-administered medications for this visit.     No Known Allergies  Family History  Problem Relation Age of Onset  . Hypertension Mother        alive  . Arthritis Mother   . Lung cancer Father        deceased  . Heart disease Father   . Other Unknown        1 brother and 1 sister both alive an healthy  . Hypertension Maternal Grandmother     Social History   Socioeconomic History  . Marital status: Married    Spouse name: Not on file  . Number of children: 3  . Years of education: Not on file  . Highest education level: Not on file  Occupational History  . Occupation: Nutritional therapist: [employer]  Social Needs  . Financial resource strain: Not on file  . Food insecurity:    Worry: Not on file    Inability: Not on file  . Transportation needs:    Medical: Not on file    Non-medical: Not on file  Tobacco Use  . Smoking status: Former Smoker    Types: Cigarettes    Last attempt  to quit: 04/20/1985    Years since quitting: 33.2  . Smokeless tobacco: Never Used  . Tobacco comment: remote tobacco use   Substance and Sexual Activity  . Alcohol use: No    Alcohol/week: 0.0 standard drinks  . Drug use: No  . Sexual activity: Never  Lifestyle  . Physical activity:    Days per week: Not on file    Minutes per session: Not on file  . Stress: Not on file  Relationships  . Social connections:    Talks on phone: Not on file    Gets together: Not on file    Attends religious service: Not on file    Active member of club or organization: Not on file    Attends meetings of clubs or organizations: Not on file    Relationship status: Not on file  . Intimate partner violence:    Fear of current or ex partner: Not on file    Emotionally abused: Not on file    Physically abused: Not on file    Forced sexual activity: Not on file  Other Topics Concern  . Not on file  Social History Narrative  . Not on file      Constitutional: Denies fever, malaise, fatigue, headache or abrupt weight changes.  Respiratory: Denies difficulty breathing, shortness of breath, cough or sputum production.   Cardiovascular: Denies chest pain, chest tightness, palpitations or swelling in the hands or feet.  Musculoskeletal: Pt reports bilateral elbow pain. Denies decrease in range of motion, difficulty with gait, muscle pain or joint swelling.  Skin: Pt reports itching of abdomen. Denies redness, rashes, lesions or ulcercations.   No other specific complaints in a complete review of systems (except as listed in HPI above).     Objective:   Physical Exam  BP 124/82   Pulse 73   Temp 97.9 F (36.6 C) (Oral)   Wt 200 lb (90.7 kg)   SpO2 98%   BMI 31.32 kg/m  Wt Readings from Last 3 Encounters:  06/23/18 200 lb (90.7 kg)  05/26/18 194 lb (88 kg)  04/01/18 194 lb (88 kg)    General: Appears her stated age, well developed, well nourished in NAD. Skin: Warm, dry and intact. Eczema dermatitis noted of upper abdomen. Cardiovascular: Normal rate and rhythm. Pulmonary/Chest: Normal effort and positive vesicular breath sounds. No respiratory distress. No wheezes, rales or ronchi noted.  Musculoskeletal: Normal flexion, extension and rotation of bilateral elbows. No joint swelling noted. Strength 5/5 BUE/ Neurological: Alert and oriented.   BMET    Component Value Date/Time   NA 138 07/27/2017 2153   K 3.5 07/27/2017 2153   CL 103 07/27/2017 2153   CO2 29 07/27/2017 2153   GLUCOSE 120 (H) 07/27/2017 2153   BUN 14 07/27/2017 2153   CREATININE 0.57 07/27/2017 2153   CALCIUM 8.7 (L) 07/27/2017 2153   GFRNONAA >60 07/27/2017 2153   GFRAA >60 07/27/2017 2153    Lipid Panel     Component Value Date/Time   CHOL 98 06/30/2017 1453   TRIG 123.0 06/30/2017 1453   HDL 13.90 (L) 06/30/2017 1453   CHOLHDL 7 06/30/2017 1453   VLDL 24.6 06/30/2017 1453   LDLCALC 60 06/30/2017 1453    CBC    Component Value  Date/Time   WBC 3.5 (L) 07/27/2017 1023   RBC 4.60 07/27/2017 1023   HGB 13.7 07/27/2017 1023   HCT 40.0 07/27/2017 1023   PLT 191 07/27/2017 1023   MCV 86.9 07/27/2017  1023   MCH 29.7 07/27/2017 1023   MCHC 34.2 07/27/2017 1023   RDW 14.8 (H) 07/27/2017 1023   LYMPHSABS 1.1 07/28/2016 1232   MONOABS 0.4 07/28/2016 1232   EOSABS 0.0 07/28/2016 1232   BASOSABS 0.0 07/28/2016 1232    Hgb A1C Lab Results  Component Value Date   HGBA1C 5.9 06/09/2016            Assessment & Plan:   Bilateral Elbow Pain:  Exam benign Offered xray bilateral elbows but she declined Advised her to try Aleve 1-2 tabs PO QHS prn  Eczema:  Discussed avoiding hot showers or baths Apply moisturizer like Aveeno, Eucerin 2 x day Can use Hydrocortisone OTC BID prn for itching  Return precaution discussed [name], NP

## 2018-06-30 DIAGNOSIS — M6281 Muscle weakness (generalized): Secondary | ICD-10-CM | POA: Diagnosis not present

## 2018-06-30 DIAGNOSIS — M2560 Stiffness of unspecified joint, not elsewhere classified: Secondary | ICD-10-CM | POA: Diagnosis not present

## 2018-06-30 DIAGNOSIS — M545 Low back pain: Secondary | ICD-10-CM | POA: Diagnosis not present

## 2018-06-30 DIAGNOSIS — M5126 Other intervertebral disc displacement, lumbar region: Secondary | ICD-10-CM | POA: Diagnosis not present

## 2018-06-30 DIAGNOSIS — M79604 Pain in right leg: Secondary | ICD-10-CM | POA: Diagnosis not present

## 2018-07-01 ENCOUNTER — Encounter: Payer: Self-pay | Admitting: Gastroenterology

## 2018-07-01 ENCOUNTER — Ambulatory Visit (INDEPENDENT_AMBULATORY_CARE_PROVIDER_SITE_OTHER): Payer: BLUE CROSS/BLUE SHIELD | Admitting: Gastroenterology

## 2018-07-01 ENCOUNTER — Other Ambulatory Visit: Payer: Self-pay

## 2018-07-01 VITALS — BP 115/75 | HR 76 | Ht 67.0 in | Wt 200.4 lb

## 2018-07-01 DIAGNOSIS — B181 Chronic viral hepatitis B without delta-agent: Secondary | ICD-10-CM | POA: Diagnosis not present

## 2018-07-01 NOTE — Progress Notes (Signed)
[name], MD [address]  [address]  [place], [place] [address]  Main: [phone]  Fax: [phone]    Gastroenterology Consultation  Referring Provider:     [name], NP Primary Care Physician:  [name], NP Primary Gastroenterologist:  Dr. [name] Reason for Consultation:  Chronic hepatitis B infection        HPI:   Diana [name] is a 58 y.o. female referred by Dr. Sampson Si, Salvadore Oxford, NP  for consultation & management of recent diagnosis of hepatitis B infection. Patient who is otherwise healthy, started experiencing intense itching about 2-3 months ago, lately she has been experiencing severe arthralgias of small and large joints. She went to her primary care doctor who checked LFTs which came back elevated predominantly transaminases, mildly elevated alkaline phosphatase and total bilirubin. This was on 07/01/2017. She had repeat LFTs which are persistently elevated. Her LFTs were last normal in 08/2016. She then underwent acute viral hepatitis panel which revealed hepatitis B surface antigen positive, hepatitis B core IgM positive. Hepatitis A antibody negative, hepatitis C antibody negative, HIV negative. She also had elevated CMV IgM antibodies. Patient reports that she has been in relationship with a 73 year old man for the last 6 months or so and following unprotected sex. She tells me that he has liver cancer and not known to have hepatitis B infection. She said, her partner is getting retested for hepatitis B after she was found to have hepatitis B. Her partner's aunt also has hepatitis B. Patient denies having blood transfusions in the past, IV drug abuse, was not active in [employer].  Follow up visit 07/19/17: She reports feeling better overall. Arthralgias are improving. She had further tests which revealed positive hepatitis B e antigen, negative e antibody, high viral load. Ultrasound liver negative for cirrhosis or liver lesions. She had  secondary liver disease workup which revealed weakly positive anti-smooth muscle antibodies. She tells me that her female partner was recently tested again for hepatitis B and came back negative and he is getting immunized against hepatitis B. She is perplexed how she contracted hepatitis B and unable to figure out how she got it.   Follow-up visit 08/18/2017 She continues to have large joint arthralgias especially shoulders and elbows. She started taking entecavir 0.5 mg daily. And, tolerating it well. Patient underwent liver biopsy which revealed features consistent with chronic hepatitis B infection. There was also evidence of mild interface hepatitis which goes along with her positive autoimmune serologies.   Follow-up visit 11/10/2017 She no longer reports arthralgias in her large joints. She is doing overall well. She reports adherence to antiviral therapy. And, tolerating it well. She is not scheduled for screening colonoscopy yet.  Follow-up visit 02/11/2018 Patient is currently taking Viread 300 mg daily.  Awaiting to receive entecavir.  She was originally on entecavir, I switched her to viread to reduce the risk of resistance.  She initially had stomach upset on viread, currently experiencing myalgias.  Therefore, decided to switch back to entecavir.  Otherwise, she denies any symptoms.  Follow-up visit 07/01/2018 She is here for 53-months follow-up of chronic hepatitis B infection.  Denies any complaints today.  She reports compliance with entecavir 1 pill a day.  NSAIDs: she recent longer taking NSAIDs  Antiplts/Anticoagulants/Anti thrombotics: none  GI Procedures: none She denies family history of colon cancer  Ultrasound-guided liver biopsy 07/27/2017 DIAGNOSIS:  A. LIVER; ULTRASOUND-GUIDED CORE BIOPSY:  - HEPATITIC PROCESS WITH MILD TO MODERATE  PORTAL AND LOBULAR  INFLAMMATION, CONSISTENT WITH VIRAL HEPATITIS B, SEE COMMENT.  - NEGATIVE FOR FIBROSIS.  - NEGATIVE FOR STEATOSIS.    Past Medical History:  Diagnosis Date  . Allergy   . Anxiety   . Ear infection    recurrent  . Hypertension     Past Surgical History:  Procedure Laterality Date  . TONSILLECTOMY    . TUBAL LIGATION     bilateral     Current Outpatient Medications:  .  cetirizine (ZYRTEC) 10 MG tablet, Take 10 mg by mouth daily., Disp: , Rfl:  .  clonazePAM (KLONOPIN) 0.5 MG tablet, TAKE 1 TABLET BY MOUTH DAILY AS NEEDED FOR ANXIETY, Disp: 30 tablet, Rfl: 0 .  diclofenac (VOLTAREN) 75 MG EC tablet, , Disp: , Rfl: 0 .  entecavir (BARACLUDE) 0.5 MG tablet, TAKE 1 TABLET BY MOUTH  EVERY DAY, Disp: 30 tablet, Rfl: 1 .  hydrochlorothiazide (MICROZIDE) 12.5 MG capsule, Take 1 capsule (12.5 mg total) by mouth daily., Disp: 30 capsule, Rfl: 10 .  lidocaine (XYLOCAINE) 2 % solution, Use as directed 5 mLs in the mouth or throat every 6 (six) hours as needed for mouth pain (swish and spit)., Disp: 100 mL, Rfl: 0 .  methocarbamol (ROBAXIN) 500 MG tablet, Take 2 tablets (1,000 mg total) by mouth 2 (two) times daily as needed for muscle spasms., Disp: 30 tablet, Rfl: 0 .  omeprazole (PRILOSEC) 20 MG capsule, TAKE 1 CAPSULE BY MOUTH EVERY DAY, Disp: 30 capsule, Rfl: 3 .  potassium chloride (K-DUR) 10 MEQ tablet, Take 2 tablets in the morning and 1 tablet in the evening., Disp: 90 tablet, Rfl: 2   Family History  Problem Relation Age of Onset  . Hypertension Mother        alive  . Arthritis Mother   . Lung cancer Father        deceased  . Heart disease Father   . Other Other        1 brother and 1 sister both alive an healthy  . Hypertension Maternal Grandmother      Social History   Tobacco Use  . Smoking status: Former Smoker    Types: Cigarettes    Last attempt to quit: 04/20/1985    Years since quitting: 33.2  . Smokeless tobacco: Never Used  . Tobacco comment: remote tobacco use   Substance Use Topics  . Alcohol use: No    Alcohol/week: 0.0 standard drinks  . Drug use: No    Allergies as  of 07/01/2018  . (No Known Allergies)    Review of Systems:    All systems reviewed and negative except where noted in HPI.   Physical Exam:  BP 115/75   Pulse 76   Ht 5\' 7"  (1.702 m)   Wt 200 lb 6.4 oz (90.9 kg)   BMI 31.39 kg/m  No LMP recorded. (Menstrual status: Perimenopausal).  General:   Alert,  Well-developed, well-nourished, pleasant and cooperative in NAD Head:  Normocephalic and atraumatic. Eyes:  Sclera clear, no icterus.   Conjunctiva pink. Ears:  Normal auditory acuity. Nose:  No deformity, discharge, or lesions. Mouth:  No deformity or lesions,oropharynx pink & moist. Neck:  Supple; no masses or thyromegaly. Lungs:  Respirations even and unlabored.  Clear throughout to auscultation.   No wheezes, crackles, or rhonchi. No acute distress. Heart:  Regular rate and rhythm; no murmurs, clicks, rubs, or gallops. Abdomen:  Normal bowel sounds. Soft, non-tender and non-distended without masses, hepatosplenomegaly or hernias  noted.  No guarding or rebound tenderness.   Rectal: Not performed Msk:  Symmetrical without gross deformities. Good, equal movement & strength bilaterally. Pulses:  Normal pulses noted. Extremities:  No clubbing or edema.  No cyanosis. Neurologic:  Alert and oriented x3;  grossly normal neurologically. Skin:  Intact without significant lesions or rashes. No jaundice. Lymph Nodes:  No significant cervical adenopathy. Psych:  Alert and cooperative. Normal mood and affect.  Imaging Studies: Last ultrasound abdomen in 05/2015 which was unremarkable  Ultrasound abdomen 06/2017 unremarkable with no evidence of cirrhosis and no portal hypertension  Assessment and Plan:   Diana [name] is a 58 y.o. female with hypertension, GERD Here for follow-up of chronic hepatitis B infection. Her hepatitis D antigen is negative. She does not have clinical signs or symptoms to suggest chronic liver disease. There is no evidence of portal hypertension. Secondary liver  disease workup revealed weakly positive anti-smooth muscle antibodies. She does not have hep C. Status post liver biopsy.  Patient did not tolerate viread, developed stomach upset and myalgias.  Therefore, currently on entecavir  Chronic hepatitis B infection, currently seroconverted, E antigen negative, E antibody positive and HBV DNA undetected as of 10/2017  - Recheck LFTs, HBV DNA today then every 6 months - Hepatitis B e antigen, hepatitis B e antibody today then every 6 months - HBsAg today then annually - to avoid alcohol use, limit intake of NSAIDs and Tylenol use for arthralgias  Colon cancer screening: overdue Average risk Recommend colonoscopy and patient agreeable, she prefers to schedule it in early 2020  Follow up in 6 months   [name], MD

## 2018-07-04 ENCOUNTER — Other Ambulatory Visit: Payer: Self-pay | Admitting: Internal Medicine

## 2018-07-06 ENCOUNTER — Other Ambulatory Visit: Payer: Self-pay | Admitting: Gastroenterology

## 2018-07-07 DIAGNOSIS — M545 Low back pain: Secondary | ICD-10-CM | POA: Diagnosis not present

## 2018-07-07 DIAGNOSIS — M6281 Muscle weakness (generalized): Secondary | ICD-10-CM | POA: Diagnosis not present

## 2018-07-07 DIAGNOSIS — M79604 Pain in right leg: Secondary | ICD-10-CM | POA: Diagnosis not present

## 2018-07-07 DIAGNOSIS — M2560 Stiffness of unspecified joint, not elsewhere classified: Secondary | ICD-10-CM | POA: Diagnosis not present

## 2018-07-11 DIAGNOSIS — M6281 Muscle weakness (generalized): Secondary | ICD-10-CM | POA: Diagnosis not present

## 2018-07-11 DIAGNOSIS — M2560 Stiffness of unspecified joint, not elsewhere classified: Secondary | ICD-10-CM | POA: Diagnosis not present

## 2018-07-11 DIAGNOSIS — M79604 Pain in right leg: Secondary | ICD-10-CM | POA: Diagnosis not present

## 2018-07-11 DIAGNOSIS — M545 Low back pain: Secondary | ICD-10-CM | POA: Diagnosis not present

## 2018-07-19 ENCOUNTER — Encounter: Payer: BLUE CROSS/BLUE SHIELD | Admitting: Internal Medicine

## 2018-08-08 ENCOUNTER — Other Ambulatory Visit: Payer: Self-pay | Admitting: Internal Medicine

## 2018-08-15 DIAGNOSIS — M545 Low back pain: Secondary | ICD-10-CM | POA: Diagnosis not present

## 2018-08-15 DIAGNOSIS — M5416 Radiculopathy, lumbar region: Secondary | ICD-10-CM | POA: Diagnosis not present

## 2018-08-15 DIAGNOSIS — M5126 Other intervertebral disc displacement, lumbar region: Secondary | ICD-10-CM | POA: Diagnosis not present

## 2018-08-17 ENCOUNTER — Other Ambulatory Visit: Payer: Self-pay | Admitting: Gastroenterology

## 2018-08-18 ENCOUNTER — Telehealth: Payer: Self-pay | Admitting: Gastroenterology

## 2018-08-18 NOTE — Telephone Encounter (Signed)
Patient called & was to get labs drawn a couple of months ago & failed to do so. She would like to have them drawn @ the hospital. Please call her.

## 2018-08-18 NOTE — Telephone Encounter (Signed)
Pt has been notified to go to [facility] for labs

## 2018-08-25 ENCOUNTER — Encounter: Payer: Self-pay | Admitting: Internal Medicine

## 2018-08-25 ENCOUNTER — Other Ambulatory Visit (INDEPENDENT_AMBULATORY_CARE_PROVIDER_SITE_OTHER): Payer: BLUE CROSS/BLUE SHIELD

## 2018-08-25 ENCOUNTER — Other Ambulatory Visit: Payer: Self-pay

## 2018-08-25 ENCOUNTER — Ambulatory Visit (INDEPENDENT_AMBULATORY_CARE_PROVIDER_SITE_OTHER): Payer: BLUE CROSS/BLUE SHIELD | Admitting: Internal Medicine

## 2018-08-25 DIAGNOSIS — M542 Cervicalgia: Secondary | ICD-10-CM

## 2018-08-25 DIAGNOSIS — R232 Flushing: Secondary | ICD-10-CM

## 2018-08-25 DIAGNOSIS — R5383 Other fatigue: Secondary | ICD-10-CM

## 2018-08-25 DIAGNOSIS — M2550 Pain in unspecified joint: Secondary | ICD-10-CM

## 2018-08-25 DIAGNOSIS — M791 Myalgia, unspecified site: Secondary | ICD-10-CM

## 2018-08-25 DIAGNOSIS — R6889 Other general symptoms and signs: Secondary | ICD-10-CM

## 2018-08-25 LAB — SEDIMENTATION RATE: Sed Rate: 23 mm/hr (ref 0–30)

## 2018-08-25 LAB — TSH: TSH: 1.45 u[IU]/mL (ref 0.35–4.50)

## 2018-08-25 LAB — HIGH SENSITIVITY CRP: CRP, High Sensitivity: 1.68 mg/L (ref 0.000–5.000)

## 2018-08-25 LAB — LUTEINIZING HORMONE: LH: 42.72 m[IU]/mL

## 2018-08-25 LAB — FOLLICLE STIMULATING HORMONE: FSH: 71.8 m[IU]/mL

## 2018-08-25 NOTE — Patient Instructions (Signed)

## 2018-08-25 NOTE — Progress Notes (Signed)
Virtual Visit via Video Note  I connected with Diana [name] on 08/25/18 at 10:00 AM EDT by a video enabled telemedicine application and verified that I am speaking with the correct person using two identifiers.  Location: Patient: Home Provider: Office   I discussed the limitations of evaluation and management by telemedicine and the availability of in person appointments. The patient expressed understanding and agreed to proceed.  History of Present Illness:  Pt reports joint pain, muscle pain, cold intolerance, and hot flashes. This has been going on for months, but just seems to be getting worse. The main joints involved are her neck, elbows, lumbar spine, and hips. She denies joint swelling, rash or redness. She reports she feels weak in her lower extremities but she thinks this is coming from her back. She fell 04/2018. She is seeing [facility]. They have done a MRI, results under media tab. She has had multiple injections which have not helped. She did PT, but did not progress so they stopped it. She has no family history of autoimmune disease.  She also reports feeling cold all the time. She is wearing long sleeves and a jacket even when it's hot outside. She intermittently has hot flashes but attributes this to menopause. She has not taken anything OTC for this.   Past Medical History:  Diagnosis Date  . Allergy   . Anxiety   . Ear infection    recurrent  . Hypertension     Current Outpatient Medications  Medication Sig Dispense Refill  . cetirizine (ZYRTEC) 10 MG tablet Take 10 mg by mouth daily.    . clonazePAM (KLONOPIN) 0.5 MG tablet TAKE 1 TABLET BY MOUTH DAILY AS NEEDED FOR ANXIETY 30 tablet 0  . diclofenac (VOLTAREN) 75 MG EC tablet   0  . entecavir (BARACLUDE) 0.5 MG tablet TAKE 1 TABLET BY MOUTH  DAILY ON AN EMPTY STOMACH 2 HOURS BEFORE OR 2 HOURS  AFTER A MEAL 30 tablet 1  . hydrochlorothiazide (MICROZIDE) 12.5 MG capsule TAKE 1 CAPSULE BY MOUTH EVERY DAY 30  capsule 1  . lidocaine (XYLOCAINE) 2 % solution Use as directed 5 mLs in the mouth or throat every 6 (six) hours as needed for mouth pain (swish and spit). 100 mL 0  . methocarbamol (ROBAXIN) 500 MG tablet Take 2 tablets (1,000 mg total) by mouth 2 (two) times daily as needed for muscle spasms. 30 tablet 0  . omeprazole (PRILOSEC) 20 MG capsule TAKE 1 CAPSULE BY MOUTH EVERY DAY 30 capsule 3  . potassium chloride (K-DUR) 10 MEQ tablet Take 2 tablets in the morning and 1 tablet in the evening. 90 tablet 2   No current facility-administered medications for this visit.     No Known Allergies  Family History  Problem Relation Age of Onset  . Hypertension Mother        alive  . Arthritis Mother   . Lung cancer Father        deceased  . Heart disease Father   . Other Other        1 brother and 1 sister both alive an healthy  . Hypertension Maternal Grandmother     Social History   Socioeconomic History  . Marital status: Married    Spouse name: Not on file  . Number of children: 3  . Years of education: Not on file  . Highest education level: Not on file  Occupational History  . Occupation: Research scientist (physical sciences): [employer]  Social Needs  . Financial resource strain: Not on file  . Food insecurity:    Worry: Not on file    Inability: Not on file  . Transportation needs:    Medical: Not on file    Non-medical: Not on file  Tobacco Use  . Smoking status: Former Smoker    Types: Cigarettes    Last attempt to quit: 04/20/1985    Years since quitting: 33.3  . Smokeless tobacco: Never Used  . Tobacco comment: remote tobacco use   Substance and Sexual Activity  . Alcohol use: No    Alcohol/week: 0.0 standard drinks  . Drug use: No  . Sexual activity: Never  Lifestyle  . Physical activity:    Days per week: Not on file    Minutes per session: Not on file  . Stress: Not on file  Relationships  . Social connections:    Talks on phone: Not on file    Gets  together: Not on file    Attends religious service: Not on file    Active member of club or organization: Not on file    Attends meetings of clubs or organizations: Not on file    Relationship status: Not on file  . Intimate partner violence:    Fear of current or ex partner: Not on file    Emotionally abused: Not on file    Physically abused: Not on file    Forced sexual activity: Not on file  Other Topics Concern  . Not on file  Social History Narrative  . Not on file     Constitutional: Pt reports fatigue. Denies fever, malaise, headache or abrupt weight changes.  Respiratory: Denies difficulty breathing, shortness of breath, cough or sputum production.   Cardiovascular: Denies chest pain, chest tightness, palpitations or swelling in the hands or feet.  Musculoskeletal: Pt reports joint pain, muscle pains, lower extremity weakness. Denies decrease in range of motion, difficulty with gait, or joint  swelling.  Skin: Denies redness, rashes, lesions or ulcercations.  Neurological: Denies numbness, tingling, difficulty with balance and coordination.  No other specific complaints in a complete review of systems (except as listed in HPI above).   Wt Readings from Last 3 Encounters:  07/01/18 200 lb 6.4 oz (90.9 kg)  06/23/18 200 lb (90.7 kg)  05/26/18 194 lb (88 kg)    General: Appears her stated age, obese, in NAD. Skin: Warm, dry and intact. No rashes noted. Pulmonary/Chest: Normal effort. No respiratory distress. Musculoskeletal: Normal flexion, extension of the cervical spine. Pain with rotation. normal flexion, extension and rotation of the elbows. No joint swelling noted. No difficulty with gait.  Neurological: Alert and oriented. Coordination normal.  Psychiatric: Mood and affect normal. Behavior is normal. Judgment and thought content normal.     BMET    Component Value Date/Time   NA 138 07/27/2017 2153   K 3.5 07/27/2017 2153   CL 103 07/27/2017 2153   CO2 29  07/27/2017 2153   GLUCOSE 120 (H) 07/27/2017 2153   BUN 14 07/27/2017 2153   CREATININE 0.57 07/27/2017 2153   CALCIUM 8.7 (L) 07/27/2017 2153   GFRNONAA >60 07/27/2017 2153   GFRAA >60 07/27/2017 2153    Lipid Panel     Component Value Date/Time   CHOL 98 06/30/2017 1453   TRIG 123.0 06/30/2017 1453   HDL 13.90 (L) 06/30/2017 1453   CHOLHDL 7 06/30/2017 1453   VLDL 24.6 06/30/2017 1453   LDLCALC 60 06/30/2017  1453    CBC    Component Value Date/Time   WBC 3.5 (L) 07/27/2017 1023   RBC 4.60 07/27/2017 1023   HGB 13.7 07/27/2017 1023   HCT 40.0 07/27/2017 1023   PLT 191 07/27/2017 1023   MCV 86.9 07/27/2017 1023   MCH 29.7 07/27/2017 1023   MCHC 34.2 07/27/2017 1023   RDW 14.8 (H) 07/27/2017 1023   LYMPHSABS 1.1 07/28/2016 1232   MONOABS 0.4 07/28/2016 1232   EOSABS 0.0 07/28/2016 1232   BASOSABS 0.0 07/28/2016 1232    Hgb A1C Lab Results  Component Value Date   HGBA1C 5.9 06/09/2016         Assessment and Plan:  Fatigue, Multiple Joint Pain, Myalgias:  Will have her schedule lab only appt for ESR, CRP, RF, ANA Encouraged regular physical activity, exercise for weight loss Consider daily NSAID vs Cymbalta  Acute Neck Pain:  Will have her schedule lab only appt for xray of cervical spine Encouraged massage, heat, ROM exercises  Cold Intolerance, Hot Flashes:  ? Related to menopause vs thyroid Will check TSH, FSH/LH  Will follow up after labs are back  Follow Up Instructions:    I discussed the assessment and treatment plan with the patient. The patient was provided an opportunity to ask questions and all were answered. The patient agreed with the plan and demonstrated an understanding of the instructions.   The patient was advised to call back or seek an in-person evaluation if the symptoms worsen or if the condition fails to improve as anticipated.     Webb Silversmith, NP

## 2018-08-26 LAB — ANA: Anti Nuclear Antibody (ANA): NEGATIVE

## 2018-08-26 LAB — RHEUMATOID FACTOR: Rheumatoid fact SerPl-aCnc: <14 IU/mL (ref <14)

## 2018-09-06 DIAGNOSIS — D225 Melanocytic nevi of trunk: Secondary | ICD-10-CM | POA: Diagnosis not present

## 2018-09-06 DIAGNOSIS — D485 Neoplasm of uncertain behavior of skin: Secondary | ICD-10-CM | POA: Diagnosis not present

## 2018-09-06 DIAGNOSIS — D1801 Hemangioma of skin and subcutaneous tissue: Secondary | ICD-10-CM | POA: Diagnosis not present

## 2018-09-08 ENCOUNTER — Other Ambulatory Visit: Payer: Self-pay | Admitting: Internal Medicine

## 2018-09-19 ENCOUNTER — Other Ambulatory Visit: Payer: Self-pay | Admitting: Internal Medicine

## 2018-09-20 NOTE — Telephone Encounter (Signed)
Last filled 05/25/2018... please advise

## 2018-09-29 DIAGNOSIS — B181 Chronic viral hepatitis B without delta-agent: Secondary | ICD-10-CM | POA: Diagnosis not present

## 2018-10-03 LAB — HEPATITIS B E ANTIGEN: HEP B E AG: NEGATIVE

## 2018-10-03 LAB — HEPATITIS B E ANTIBODY: Hep B E Ab: POSITIVE — AB

## 2018-10-03 LAB — HEPATITIS B SURFACE ANTIGEN: Hepatitis B Surface Ag: NEGATIVE

## 2018-10-11 ENCOUNTER — Ambulatory Visit (INDEPENDENT_AMBULATORY_CARE_PROVIDER_SITE_OTHER): Payer: BC Managed Care – PPO | Admitting: Internal Medicine

## 2018-10-11 ENCOUNTER — Encounter: Payer: Self-pay | Admitting: Internal Medicine

## 2018-10-11 ENCOUNTER — Other Ambulatory Visit: Payer: Self-pay

## 2018-10-11 VITALS — BP 118/82 | HR 70 | Temp 98.0°F | Ht 66.75 in | Wt 203.0 lb

## 2018-10-11 DIAGNOSIS — Z1239 Encounter for other screening for malignant neoplasm of breast: Secondary | ICD-10-CM

## 2018-10-11 DIAGNOSIS — F411 Generalized anxiety disorder: Secondary | ICD-10-CM

## 2018-10-11 DIAGNOSIS — K219 Gastro-esophageal reflux disease without esophagitis: Secondary | ICD-10-CM | POA: Diagnosis not present

## 2018-10-11 DIAGNOSIS — Z0000 Encounter for general adult medical examination without abnormal findings: Secondary | ICD-10-CM | POA: Diagnosis not present

## 2018-10-11 DIAGNOSIS — I10 Essential (primary) hypertension: Secondary | ICD-10-CM | POA: Diagnosis not present

## 2018-10-11 DIAGNOSIS — G8929 Other chronic pain: Secondary | ICD-10-CM | POA: Insufficient documentation

## 2018-10-11 DIAGNOSIS — M545 Low back pain, unspecified: Secondary | ICD-10-CM | POA: Insufficient documentation

## 2018-10-11 DIAGNOSIS — B181 Chronic viral hepatitis B without delta-agent: Secondary | ICD-10-CM

## 2018-10-11 DIAGNOSIS — R7309 Other abnormal glucose: Secondary | ICD-10-CM | POA: Diagnosis not present

## 2018-10-11 DIAGNOSIS — E782 Mixed hyperlipidemia: Secondary | ICD-10-CM

## 2018-10-11 LAB — COMPREHENSIVE METABOLIC PANEL
ALT: 11 U/L (ref 0–35)
AST: 13 U/L (ref 0–37)
Albumin: 4.4 g/dL (ref 3.5–5.2)
Alkaline Phosphatase: 72 U/L (ref 39–117)
BUN: 13 mg/dL (ref 6–23)
CO2: 34 mEq/L — ABNORMAL HIGH (ref 19–32)
Calcium: 9.7 mg/dL (ref 8.4–10.5)
Chloride: 97 mEq/L (ref 96–112)
Creatinine, Ser: 0.6 mg/dL (ref 0.40–1.20)
GFR: 103.46 mL/min (ref >60.00)
Glucose, Bld: 119 mg/dL — ABNORMAL HIGH (ref 70–99)
Potassium: 3.6 mEq/L (ref 3.5–5.1)
Sodium: 139 mEq/L (ref 135–145)
Total Bilirubin: 1.7 mg/dL — ABNORMAL HIGH (ref 0.2–1.2)
Total Protein: 7.2 g/dL (ref 6.0–8.3)

## 2018-10-11 LAB — LIPID PANEL
Cholesterol: 214 mg/dL — ABNORMAL HIGH (ref 0–200)
HDL: 23.6 mg/dL — ABNORMAL LOW (ref >39.00)
LDL Cholesterol: 175 mg/dL — ABNORMAL HIGH (ref 0–99)
NonHDL: 190.47
Total CHOL/HDL Ratio: 9
Triglycerides: 78 mg/dL (ref 0.0–149.0)
VLDL: 15.6 mg/dL (ref 0.0–40.0)

## 2018-10-11 LAB — HEMOGLOBIN A1C: Hgb A1c MFr Bld: 6.2 % (ref 4.6–6.5)

## 2018-10-11 LAB — VITAMIN D 25 HYDROXY (VIT D DEFICIENCY, FRACTURES): VITD: 29.41 ng/mL — ABNORMAL LOW (ref 30.00–100.00)

## 2018-10-11 LAB — CBC
HCT: 42 % (ref 36.0–46.0)
Hemoglobin: 14.1 g/dL (ref 12.0–15.0)
MCHC: 33.6 g/dL (ref 30.0–36.0)
MCV: 86.7 fl (ref 78.0–100.0)
Platelets: 229 10*3/uL (ref 150.0–400.0)
RBC: 4.85 Mil/uL (ref 3.87–5.11)
RDW: 14.9 % (ref 11.5–15.5)
WBC: 5.6 10*3/uL (ref 4.0–10.5)

## 2018-10-11 NOTE — Assessment & Plan Note (Signed)
Support offered today. Takes Clonazepam occasionally.

## 2018-10-11 NOTE — Progress Notes (Signed)
Subjective:    Patient ID: Diana [name], female    DOB: 09/15/1960, 58 y.o.   MRN: [id number]  HPI  Pt presents to the clinic today for her annual exam. She is also due to follow up HTN.  HTN: Her BP today is 118/82. She is taking HCTZ daily as prescribed. She is not taking a Potassium supplement as prescribed. ECG from 03/2017 reviewed.  Anxiety: Triggered by family stress. She takes Clonazepam on a very rare basis. She denies depression, SI/HI.  GERD: She denies breakthrough on Omeprazole. There is no upper GI of file.  Hepatitis B: She is taking Entecavir as prescribed. She follows with GI.  Intermittent Low Back Pain: She take Rabaxin as needed with good relief of symptoms.  Flu: never Tetanus: 03/2015 Pap Smear: 05/2017 Mammogram: > 5 years ago Colon Screening: > 10 years ago Vision Screening: as needed Dentist: annually  Diet: She does eat meat. She does not eat any fruits and veggies. She does eat fried foods. She drinks mostly water. Exercise: None  Review of Systems      Past Medical History:  Diagnosis Date  . Allergy   . Anxiety   . Ear infection    recurrent  . Hypertension     Current Outpatient Medications  Medication Sig Dispense Refill  . Ascorbic Acid (VITAMIN C ADULT GUMMIES) 125 MG CHEW Chew by mouth.    . cetirizine (ZYRTEC) 10 MG tablet Take 10 mg by mouth daily.    . Cholecalciferol (VITAMIN D3) 25 MCG (1000 UT) CAPS Take 2,000 Units by mouth.    . clonazePAM (KLONOPIN) 0.5 MG tablet TAKE 1 TABLET BY MOUTH DAILY AS NEEDED FOR ANXIETY 30 tablet 0  . entecavir (BARACLUDE) 0.5 MG tablet TAKE 1 TABLET BY MOUTH  DAILY ON AN EMPTY STOMACH 2 HOURS BEFORE OR 2 HOURS  AFTER A MEAL 30 tablet 1  . hydrochlorothiazide (MICROZIDE) 12.5 MG capsule TAKE 1 CAPSULE BY MOUTH EVERY DAY 30 capsule 1  . lidocaine (XYLOCAINE) 2 % solution Use as directed 5 mLs in the mouth or throat every 6 (six) hours as needed for mouth pain (swish and spit). 100 mL 0  .  methocarbamol (ROBAXIN) 500 MG tablet Take 2 tablets (1,000 mg total) by mouth 2 (two) times daily as needed for muscle spasms. (Patient not taking: Reported on 08/25/2018) 30 tablet 0  . omeprazole (PRILOSEC) 20 MG capsule TAKE 1 CAPSULE BY MOUTH EVERY DAY 30 capsule 3  . potassium chloride (K-DUR) 10 MEQ tablet Take 2 tablets in the morning and 1 tablet in the evening. 90 tablet 2  . Zinc 50 MG CAPS Take by mouth.     No current facility-administered medications for this visit.     No Known Allergies  Family History  Problem Relation Age of Onset  . Hypertension Mother        alive  . Arthritis Mother   . Lung cancer Father        deceased  . Heart disease Father   . Other Other        1 brother and 1 sister both alive an healthy  . Hypertension Maternal Grandmother     Social History   Socioeconomic History  . Marital status: Married    Spouse name: Not on file  . Number of children: 3  . Years of education: Not on file  . Highest education level: Not on file  Occupational History  . Occupation: Clinical biochemistcustomer service  Employer: [employer]  Social Needs  . Financial resource strain: Not on file  . Food insecurity    Worry: Not on file    Inability: Not on file  . Transportation needs    Medical: Not on file    Non-medical: Not on file  Tobacco Use  . Smoking status: Former Smoker    Types: Cigarettes    Quit date: 04/20/1985    Years since quitting: 33.4  . Smokeless tobacco: Never Used  . Tobacco comment: remote tobacco use   Substance and Sexual Activity  . Alcohol use: No    Alcohol/week: 0.0 standard drinks  . Drug use: No  . Sexual activity: Never  Lifestyle  . Physical activity    Days per week: Not on file    Minutes per session: Not on file  . Stress: Not on file  Relationships  . Social Herbalist on phone: Not on file    Gets together: Not on file    Attends religious service: Not on file    Active member of club or organization: Not  on file    Attends meetings of clubs or organizations: Not on file    Relationship status: Not on file  . Intimate partner violence    Fear of current or ex partner: Not on file    Emotionally abused: Not on file    Physically abused: Not on file    Forced sexual activity: Not on file  Other Topics Concern  . Not on file  Social History Narrative  . Not on file     Constitutional: Denies fever, malaise, fatigue, headache or abrupt weight changes.  HEENT: Denies eye pain, eye redness, ear pain, ringing in the ears, wax buildup, runny nose, nasal congestion, bloody nose, or sore throat. Respiratory: Denies difficulty breathing, shortness of breath, cough or sputum production.   Cardiovascular: Denies chest pain, chest tightness, palpitations or swelling in the hands or feet.  Gastrointestinal: Complains of constipation associated with IBS.  Denies abdominal pain, bloating, diarrhea or blood in the stool.  GU: Denies urgency, frequency, pain with urination, burning sensation, blood in urine, odor or discharge. Musculoskeletal: Complains of lower back pain. Denies decrease in range of motion, difficulty with gait or joint pain and swelling.  Skin: Denies redness, rashes, lesions or ulcercations.  Neurological: Denies dizziness, difficulty with memory, difficulty with speech or problems with balance and coordination.  Psych: Pt has history of anxiety. Denies depression, SI/HI.  No other specific complaints in a complete review of systems (except as listed in HPI above).  Objective:   Physical Exam  BP 118/82   Pulse 70   Temp 98 F (36.7 C) (Oral)   Ht 5' 6.75" (1.695 m)   Wt 203 lb (92.1 kg)   LMP 01/20/2016   SpO2 98%   BMI 32.03 kg/m  Wt Readings from Last 3 Encounters:  10/11/18 203 lb (92.1 kg)  07/01/18 200 lb 6.4 oz (90.9 kg)  06/23/18 200 lb (90.7 kg)    General: Appears her stated age, obese,  in NAD. Skin: Warm, dry and intact. No rashes noted. HEENT: Head:  normal shape and size; Eyes: sclera white, no icterus, conjunctiva pink, PERRLA and EOMs intact; Ears: Tm's gray and intact, normal light reflex; Nose: mucosa pink and moist, septum midline;  Neck:  Neck supple, trachea midline. No masses, lumps or thyromegaly present.  Cardiovascular: Normal rate and rhythm. S1,S2 noted.  No murmur, rubs or gallops  noted. No JVD or BLE edema. No carotid bruits noted. Pulmonary/Chest: Normal effort and positive vesicular breath sounds. No respiratory distress. No wheezes, rales or ronchi noted.  Abdomen: Soft and nontender. Normal bowel sounds. No distention or masses noted. Liver, spleen and kidneys non palpable. Musculoskeletal: Strength 5/5 BUE/BLE. No difficulty with gait.  Neurological: Alert and oriented. Cranial nerves II-XII grossly intact. Coordination normal.  Psychiatric: Mood and affect normal. Behavior is normal. Judgment and thought content normal.     BMET    Component Value Date/Time   NA 138 07/27/2017 2153   K 3.5 07/27/2017 2153   CL 103 07/27/2017 2153   CO2 29 07/27/2017 2153   GLUCOSE 120 (H) 07/27/2017 2153   BUN 14 07/27/2017 2153   CREATININE 0.57 07/27/2017 2153   CALCIUM 8.7 (L) 07/27/2017 2153   GFRNONAA >60 07/27/2017 2153   GFRAA >60 07/27/2017 2153    Lipid Panel     Component Value Date/Time   CHOL 98 06/30/2017 1453   TRIG 123.0 06/30/2017 1453   HDL 13.90 (L) 06/30/2017 1453   CHOLHDL 7 06/30/2017 1453   VLDL 24.6 06/30/2017 1453   LDLCALC 60 06/30/2017 1453    CBC    Component Value Date/Time   WBC 3.5 (L) 07/27/2017 1023   RBC 4.60 07/27/2017 1023   HGB 13.7 07/27/2017 1023   HCT 40.0 07/27/2017 1023   PLT 191 07/27/2017 1023   MCV 86.9 07/27/2017 1023   MCH 29.7 07/27/2017 1023   MCHC 34.2 07/27/2017 1023   RDW 14.8 (H) 07/27/2017 1023   LYMPHSABS 1.1 07/28/2016 1232   MONOABS 0.4 07/28/2016 1232   EOSABS 0.0 07/28/2016 1232   BASOSABS 0.0 07/28/2016 1232    Hgb A1C Lab Results  Component  Value Date   HGBA1C 5.9 06/09/2016            Assessment & Plan:    Preventative Health Maintenance:  Flu shot- encouraged to obtain in the fall. Tetanus UTD Pap Smear- UTD Mammogram ordered- [facility] Screening- Declines colonoscopy but is agreeable to do Cologuard. Continue to see eye doctor annually and dentist as needed. Continue to try and consume a low fat, low carb diet and increase exercise. Will check CBC, Cmet, Lipids, Vitamin D, A1C today.  Will follow up after labs return.  , Follow up in 1 year for annual exam.  Nicki Reaperegina Kimari Lienhard, NP

## 2018-10-11 NOTE — Assessment & Plan Note (Signed)
Continue to use Robaxin for symptoms if needed. Sees orthopedic but wants to consider getting a second opinion. Increase exercise and perform stretches daily to decrease pain.

## 2018-10-11 NOTE — Assessment & Plan Note (Signed)
Continue to take Omeprazole as prescribed. Avoid foods that trigger reflux. CBC, Cmet drawn today.

## 2018-10-11 NOTE — Assessment & Plan Note (Signed)
CBC, CMet drawn today Continue on HCTZ as prescribed. Will see if she needs to restart potassium supplement based on labs. Reinforced DASH diet and importance of regular exercise.

## 2018-10-11 NOTE — Patient Instructions (Signed)
Health Maintenance, Female Adopting a healthy lifestyle and getting preventive care can go a long way to promote health and wellness. Talk with your health care provider about what schedule of regular examinations is right for you. This is a good chance for you to check in with your provider about disease prevention and staying healthy. In between checkups, there are plenty of things you can do on your own. Experts have done a lot of research about which lifestyle changes and preventive measures are most likely to keep you healthy. Ask your health care provider for more information. Weight and diet Eat a healthy diet  Be sure to include plenty of vegetables, fruits, low-fat dairy products, and lean protein.  Do not eat a lot of foods high in solid fats, added sugars, or salt.  Get regular exercise. This is one of the most important things you can do for your health. ? Most adults should exercise for at least 150 minutes each week. The exercise should increase your heart rate and make you sweat (moderate-intensity exercise). ? Most adults should also do strengthening exercises at least twice a week. This is in addition to the moderate-intensity exercise. Maintain a healthy weight  Body mass index (BMI) is a measurement that can be used to identify possible weight problems. It estimates body fat based on height and weight. Your health care provider can help determine your BMI and help you achieve or maintain a healthy weight.  For females 56 years of age and older: ? A BMI below 18.5 is considered underweight. ? A BMI of 18.5 to 24.9 is normal. ? A BMI of 25 to 29.9 is considered overweight. ? A BMI of 30 and above is considered obese. Watch levels of cholesterol and blood lipids  You should start having your blood tested for lipids and cholesterol at 56 years of age, then have this test every 5 years.  You may need to have your cholesterol levels checked more often if: ? Your lipid or  cholesterol levels are high. ? You are older than 56 years of age. ? You are at high risk for heart disease. Cancer screening Lung Cancer  Lung cancer screening is recommended for adults 56-63 years old who are at high risk for lung cancer because of a history of smoking.  A yearly low-dose CT scan of the lungs is recommended for people who: ? Currently smoke. ? Have quit within the past 15 years. ? Have at least a 30-pack-year history of smoking. A pack year is smoking an average of one pack of cigarettes a day for 1 year.  Yearly screening should continue until it has been 15 years since you quit.  Yearly screening should stop if you develop a health problem that would prevent you from having lung cancer treatment. Breast Cancer  Practice breast self-awareness. This means understanding how your breasts normally appear and feel.  It also means doing regular breast self-exams. Let your health care provider know about any changes, no matter how small.  If you are in your 56s or 30s, you should have a clinical breast exam (CBE) by a health care provider every 1-3 years as part of a regular health exam.  If you are 72 or older, have a CBE every year. Also consider having a breast X-ray (mammogram) every year.  If you have a family history of breast cancer, talk to your health care provider about genetic screening.  If you are at high risk for breast cancer, talk  to your health care provider about having an MRI and a mammogram every year.  Breast cancer gene (BRCA) assessment is recommended for women who have family members with BRCA-related cancers. BRCA-related cancers include: ? Breast. ? Ovarian. ? Tubal. ? Peritoneal cancers.  Results of the assessment will determine the need for genetic counseling and BRCA1 and BRCA2 testing. Cervical Cancer Your health care provider may recommend that you be screened regularly for cancer of the pelvic organs (ovaries, uterus, and vagina).  This screening involves a pelvic examination, including checking for microscopic changes to the surface of your cervix (Pap test). You may be encouraged to have this screening done every 3 years, beginning at age 21.  For women ages 30-65, health care providers may recommend pelvic exams and Pap testing every 3 years, or they may recommend the Pap and pelvic exam, combined with testing for human papilloma virus (HPV), every 5 years. Some types of HPV increase your risk of cervical cancer. Testing for HPV may also be done on women of any age with unclear Pap test results.  Other health care providers may not recommend any screening for nonpregnant women who are considered low risk for pelvic cancer and who do not have symptoms. Ask your health care provider if a screening pelvic exam is right for you.  If you have had past treatment for cervical cancer or a condition that could lead to cancer, you need Pap tests and screening for cancer for at least 20 years after your treatment. If Pap tests have been discontinued, your risk factors (such as having a new sexual partner) need to be reassessed to determine if screening should resume. Some women have medical problems that increase the chance of getting cervical cancer. In these cases, your health care provider may recommend more frequent screening and Pap tests. Colorectal Cancer  This type of cancer can be detected and often prevented.  Routine colorectal cancer screening usually begins at 56 years of age and continues through 56 years of age.  Your health care provider may recommend screening at an earlier age if you have risk factors for colon cancer.  Your health care provider may also recommend using home test kits to check for hidden blood in the stool.  A small camera at the end of a tube can be used to examine your colon directly (sigmoidoscopy or colonoscopy). This is done to check for the earliest forms of colorectal cancer.  Routine  screening usually begins at age 50.  Direct examination of the colon should be repeated every 5-10 years through 56 years of age. However, you may need to be screened more often if early forms of precancerous polyps or small growths are found. Skin Cancer  Check your skin from head to toe regularly.  Tell your health care provider about any new moles or changes in moles, especially if there is a change in a mole's shape or color.  Also tell your health care provider if you have a mole that is larger than the size of a pencil eraser.  Always use sunscreen. Apply sunscreen liberally and repeatedly throughout the day.  Protect yourself by wearing long sleeves, pants, a wide-brimmed hat, and sunglasses whenever you are outside. Heart disease, diabetes, and high blood pressure  High blood pressure causes heart disease and increases the risk of stroke. High blood pressure is more likely to develop in: ? People who have blood pressure in the high end of the normal range (130-139/85-89 mm Hg). ? People   who are overweight or obese. ? People who are African American.  If you are 62-58 years of age, have your blood pressure checked every 3-5 years. If you are 19 years of age or older, have your blood pressure checked every year. You should have your blood pressure measured twice-once when you are at a hospital or clinic, and once when you are not at a hospital or clinic. Record the average of the two measurements. To check your blood pressure when you are not at a hospital or clinic, you can use: ? An automated blood pressure machine at a pharmacy. ? A home blood pressure monitor.  If you are between 34 years and 22 years old, ask your health care provider if you should take aspirin to prevent strokes.  Have regular diabetes screenings. This involves taking a blood sample to check your fasting blood sugar level. ? If you are at a normal weight and have a low risk for diabetes, have this test once  every three years after 56 years of age. ? If you are overweight and have a high risk for diabetes, consider being tested at a younger age or more often. Preventing infection Hepatitis B  If you have a higher risk for hepatitis B, you should be screened for this virus. You are considered at high risk for hepatitis B if: ? You were born in a country where hepatitis B is common. Ask your health care provider which countries are considered high risk. ? Your parents were born in a high-risk country, and you have not been immunized against hepatitis B (hepatitis B vaccine). ? You have HIV or AIDS. ? You use needles to inject street drugs. ? You live with someone who has hepatitis B. ? You have had sex with someone who has hepatitis B. ? You get hemodialysis treatment. ? You take certain medicines for conditions, including cancer, organ transplantation, and autoimmune conditions. Hepatitis C  Blood testing is recommended for: ? Everyone born from [birth year] through 1963. ? Anyone with known risk factors for hepatitis C. Sexually transmitted infections (STIs)  You should be screened for sexually transmitted infections (STIs) including gonorrhea and chlamydia if: ? You are sexually active and are younger than 56 years of age. ? You are older than 56 years of age and your health care provider tells you that you are at risk for this type of infection. ? Your sexual activity has changed since you were last screened and you are at an increased risk for chlamydia or gonorrhea. Ask your health care provider if you are at risk.  If you do not have HIV, but are at risk, it may be recommended that you take a prescription medicine daily to prevent HIV infection. This is called pre-exposure prophylaxis (PrEP). You are considered at risk if: ? You are sexually active and do not regularly use condoms or know the HIV status of your partner(s). ? You take drugs by injection. ? You are sexually active with a partner  who has HIV. Talk with your health care provider about whether you are at high risk of being infected with HIV. If you choose to begin PrEP, you should first be tested for HIV. You should then be tested every 3 months for as long as you are taking PrEP. Pregnancy  If you are premenopausal and you may become pregnant, ask your health care provider about preconception counseling.  If you may become pregnant, take 400 to 800 micrograms (mcg) of folic acid every  day.  If you want to prevent pregnancy, talk to your health care provider about birth control (contraception). Osteoporosis and menopause  Osteoporosis is a disease in which the bones lose minerals and strength with aging. This can result in serious bone fractures. Your risk for osteoporosis can be identified using a bone density scan.  If you are 36 years of age or older, or if you are at risk for osteoporosis and fractures, ask your health care provider if you should be screened.  Ask your health care provider whether you should take a calcium or vitamin D supplement to lower your risk for osteoporosis.  Menopause may have certain physical symptoms and risks.  Hormone replacement therapy may reduce some of these symptoms and risks. Talk to your health care provider about whether hormone replacement therapy is right for you. Follow these instructions at home:  Schedule regular health, dental, and eye exams.  Stay current with your immunizations.  Do not use any tobacco products including cigarettes, chewing tobacco, or electronic cigarettes.  If you are pregnant, do not drink alcohol.  If you are breastfeeding, limit how much and how often you drink alcohol.  Limit alcohol intake to no more than 1 drink per day for nonpregnant women. One drink equals 12 ounces of beer, 5 ounces of wine, or 1 ounces of hard liquor.  Do not use street drugs.  Do not share needles.  Ask your health care provider for help if you need support  or information about quitting drugs.  Tell your health care provider if you often feel depressed.  Tell your health care provider if you have ever been abused or do not feel safe at home. This information is not intended to replace advice given to you by your health care provider. Make sure you discuss any questions you have with your health care provider. Document Released: 10/20/2010 Document Revised: 09/12/2015 Document Reviewed: 01/08/2015 Elsevier Interactive Patient Education  2019 [employer].

## 2018-10-11 NOTE — Assessment & Plan Note (Signed)
Continue taking Entecavir as prescribed. Continue to follow up with GI.

## 2018-10-14 ENCOUNTER — Telehealth: Payer: Self-pay | Admitting: Internal Medicine

## 2018-10-14 DIAGNOSIS — M545 Low back pain, unspecified: Secondary | ICD-10-CM

## 2018-10-14 DIAGNOSIS — G8929 Other chronic pain: Secondary | ICD-10-CM

## 2018-10-14 NOTE — Telephone Encounter (Signed)
Patient called today to see if a referral could be sent to a back specialist . She stated that she thought the name that was mentioned at her last visit was Dr Arnoldo Morale   Patient's C/B # [phone]

## 2018-10-17 NOTE — Addendum Note (Signed)
Addended by: Jearld Fenton on: 10/17/2018 08:10 AM   Modules accepted: Orders

## 2018-10-17 NOTE — Telephone Encounter (Signed)
done

## 2018-10-18 NOTE — Addendum Note (Signed)
Addended by: Lurlean Nanny on: 10/18/2018 11:20 AM   Modules accepted: Orders

## 2018-10-25 NOTE — Addendum Note (Signed)
Addended by: Lurlean Nanny on: 10/25/2018 08:48 AM   Modules accepted: Orders

## 2018-10-27 ENCOUNTER — Telehealth: Payer: Self-pay

## 2018-10-27 ENCOUNTER — Ambulatory Visit (INDEPENDENT_AMBULATORY_CARE_PROVIDER_SITE_OTHER): Payer: BC Managed Care – PPO | Admitting: Family Medicine

## 2018-10-27 ENCOUNTER — Encounter: Payer: Self-pay | Admitting: Family Medicine

## 2018-10-27 ENCOUNTER — Other Ambulatory Visit: Payer: Self-pay

## 2018-10-27 VITALS — BP 124/86 | HR 78 | Temp 98.3°F | Ht 66.75 in | Wt 204.2 lb

## 2018-10-27 DIAGNOSIS — S3992XA Unspecified injury of lower back, initial encounter: Secondary | ICD-10-CM | POA: Diagnosis not present

## 2018-10-27 NOTE — Telephone Encounter (Signed)
Patient is checking on refill for her Potassium supplement. Patient thought this would be sent in and that [facility] wanted her to take it. I do not see a note saying this in her lab results only that the decision would be made once her lab results came in. Sending for review to PCP.

## 2018-10-27 NOTE — Progress Notes (Signed)
   Subjective:     Diana [name] is a 58 y.o. female presenting for Fall (last night-10/26/2018. She was pulling herself up by using a chair and ended up falling backwards because chair came off the floor towards her. She hit her tail bone and the back of her head)     HPI   #Tailbone pain - no loc when it occurred - treatment: took 2 advil last night and this morning, sitting on ice pack - pain primarily in the buttock - no radiating pain - had severe chronic back with disc pain which has not specifically flared that up - has not looked for a bruise in that area - worse with sitting and bending over - some mild improvement from yesterday - no loss of control of bowel or bladder - no leg weakness    Review of Systems  Eyes: Negative for visual disturbance.  Gastrointestinal: Negative for nausea and vomiting.  Neurological: Positive for light-headedness. Negative for headaches.     Social History   Tobacco Use  Smoking Status Former Smoker  . Types: Cigarettes  . Quit date: 04/20/1985  . Years since quitting: 33.5  Smokeless Tobacco Never Used  Tobacco Comment   remote tobacco use         Objective:    BP Readings from Last 3 Encounters:  10/27/18 124/86  10/11/18 118/82  07/01/18 115/75   Wt Readings from Last 3 Encounters:  10/27/18 204 lb 4 oz (92.6 kg)  10/11/18 203 lb (92.1 kg)  07/01/18 200 lb 6.4 oz (90.9 kg)    BP 124/86   Pulse 78   Temp 98.3 F (36.8 C)   Ht 5' 6.75" (1.695 m)   Wt 204 lb 4 oz (92.6 kg)   LMP 01/20/2016   BMI 32.23 kg/m    Physical Exam Constitutional:      General: She is not in acute distress.    Appearance: She is well-developed. She is not diaphoretic.  HENT:     Right Ear: External ear normal.     Left Ear: External ear normal.     Nose: Nose normal.  Eyes:     Conjunctiva/sclera: Conjunctivae normal.  Neck:     Musculoskeletal: Neck supple.  Cardiovascular:     Rate and Rhythm: Normal rate.  Pulmonary:     Effort: Pulmonary effort is normal.  Musculoskeletal:     Comments: Back: Inspection: no bruising or swelling Palpation: TTP along the upper buttock, no spinous TTP  Able to sit and stand w/o difficulty  Skin:    General: Skin is warm and dry.     Capillary Refill: Capillary refill takes less than 2 seconds.  Neurological:     Mental Status: She is alert. Mental status is at baseline.  Psychiatric:        Mood and Affect: Mood normal.        Behavior: Behavior normal.           Assessment & Plan:   Problem List Items Addressed This Visit    None    Visit Diagnoses    Injury of coccyx, initial encounter    -  Primary     Ice, nsaids, avoiding prolonged sitting    Return if symptoms worsen or fail to improve.  Lesleigh Noe, MD

## 2018-10-27 NOTE — Patient Instructions (Signed)
Can take Diclofenac as prescribed OR can take 800 mg of Ibuprofen every 8 hours as needed   Tailbone Injury The tailbone is the small bone at the lower end of the backbone (spine). The tailbone can become injured from:  A fall.  Sitting to row or bike for a long time.  Having a baby. This type of injury can be painful. Most tailbone injuries get better on their own in 4-6 weeks. Follow these instructions at home: Activity  Avoid sitting in one place for a long time.  Wear proper pads and gear when riding a bike or rowing.  Increase your activity as the pain allows.  Do exercises as told by your doctor or physical therapist. Managing pain, stiffness, and swelling  To lessen pain: ? Sit on a large, rubber or inflated ring or cushion. ? Lean forward when you sit.  If told, apply ice to the injured area. ? Put ice in a plastic bag. ? Place a towel between your skin and the bag. ? Leave the ice on for 20 minutes, 2-3 times per day. Do this for the first 1-2 days.  If told, put heat on the injured area. Do this as often as told by your doctor. Use the heat source that your doctor recommends, such as a moist heat pack or a heating pad. ? Place a towel between your skin and the heat source. ? Leave the heat on for 20-30 minutes. ? Remove the heat if your skin turns bright red. This is very important if you are unable to feel pain, heat, or cold. You may have a greater risk of getting burned. General instructions  Take over-the-counter and prescription medicines only as told by your doctor.  To prevent or treat trouble pooping (constipation) or pain when pooping, your doctor may suggest that you: ? Drink enough fluid to keep your pee (urine) pale yellow. ? Eat foods that are high in fiber. These include fresh fruits and vegetables, whole grains, and beans. ? Limit foods that are high in fat and sugar. These include fried and sweet foods. ? Take an over-the-counter or prescription  medicine to treat trouble pooping.  Keep all follow-up visits as told by your doctor. This is important. Contact a doctor if:  Your pain gets worse.  Pooping causes you pain.  You cannot poop after 4 days.  You have pain during sex. Summary  A tailbone injury can happen from a fall, from sitting for a long time to row or bike, or after having a baby.  These injuries can be painful. Most tailbone injuries get better on their own in 4-6 weeks.  Sit on a large, rubber or inflated ring or cushion to lessen pain.  Avoid sitting in one place for a long time.  Follow your doctor's suggestions to prevent or treat trouble pooping. This information is not intended to replace advice given to you by your health care provider. Make sure you discuss any questions you have with your health care provider. Document Released: 05/09/2010 Document Revised: 05/04/2017 Document Reviewed: 05/04/2017 Elsevier Patient Education  2020 [employer].

## 2018-10-28 NOTE — Telephone Encounter (Signed)
Results reviewed with patient, expressed understanding. Nothing further needed.   

## 2018-10-28 NOTE — Telephone Encounter (Signed)
Potassium level is normal at this point without her taking, so I think we can hold off potassium for now.

## 2018-10-31 ENCOUNTER — Ambulatory Visit: Payer: BC Managed Care – PPO | Admitting: Family Medicine

## 2018-11-08 ENCOUNTER — Other Ambulatory Visit: Payer: Self-pay | Admitting: Gastroenterology

## 2018-11-09 ENCOUNTER — Other Ambulatory Visit: Payer: Self-pay | Admitting: Internal Medicine

## 2018-11-15 DIAGNOSIS — M545 Low back pain: Secondary | ICD-10-CM | POA: Diagnosis not present

## 2018-11-15 DIAGNOSIS — M5416 Radiculopathy, lumbar region: Secondary | ICD-10-CM | POA: Diagnosis not present

## 2018-11-15 DIAGNOSIS — M7072 Other bursitis of hip, left hip: Secondary | ICD-10-CM | POA: Diagnosis not present

## 2018-12-13 ENCOUNTER — Other Ambulatory Visit: Payer: Self-pay | Admitting: Internal Medicine

## 2018-12-21 ENCOUNTER — Other Ambulatory Visit: Payer: Self-pay | Admitting: Internal Medicine

## 2018-12-21 NOTE — Telephone Encounter (Signed)
Last filled 09/20/2018... please advise 

## 2018-12-22 ENCOUNTER — Encounter: Payer: Self-pay | Admitting: Internal Medicine

## 2018-12-22 ENCOUNTER — Ambulatory Visit (INDEPENDENT_AMBULATORY_CARE_PROVIDER_SITE_OTHER): Payer: BC Managed Care – PPO | Admitting: Internal Medicine

## 2018-12-22 ENCOUNTER — Other Ambulatory Visit: Payer: Self-pay

## 2018-12-22 VITALS — BP 116/74 | HR 73 | Temp 98.7°F | Wt 205.0 lb

## 2018-12-22 DIAGNOSIS — Z79899 Other long term (current) drug therapy: Secondary | ICD-10-CM

## 2018-12-22 DIAGNOSIS — F411 Generalized anxiety disorder: Secondary | ICD-10-CM | POA: Diagnosis not present

## 2018-12-22 DIAGNOSIS — K644 Residual hemorrhoidal skin tags: Secondary | ICD-10-CM | POA: Diagnosis not present

## 2018-12-22 DIAGNOSIS — Z1159 Encounter for screening for other viral diseases: Secondary | ICD-10-CM | POA: Diagnosis not present

## 2018-12-22 DIAGNOSIS — R208 Other disturbances of skin sensation: Secondary | ICD-10-CM | POA: Diagnosis not present

## 2018-12-22 DIAGNOSIS — R251 Tremor, unspecified: Secondary | ICD-10-CM

## 2018-12-22 DIAGNOSIS — R6889 Other general symptoms and signs: Secondary | ICD-10-CM

## 2018-12-22 DIAGNOSIS — Z113 Encounter for screening for infections with a predominantly sexual mode of transmission: Secondary | ICD-10-CM

## 2018-12-22 LAB — T4, FREE: Free T4: 1.02 ng/dL (ref 0.60–1.60)

## 2018-12-22 LAB — TSH: TSH: 1.52 u[IU]/mL (ref 0.35–4.50)

## 2018-12-22 MED ORDER — SERTRALINE HCL 50 MG PO TABS: 25.0000 mg | ORAL_TABLET | Freq: Every day | ORAL | 2 refills | Status: DC

## 2018-12-22 MED ORDER — HYDROCORTISONE (PERIANAL) 2.5 % EX CREA: 1.0000 "application " | TOPICAL_CREAM | Freq: Two times a day (BID) | CUTANEOUS | 0 refills | Status: DC

## 2018-12-22 NOTE — Patient Instructions (Signed)

## 2018-12-22 NOTE — Progress Notes (Signed)
Subjective:    Patient ID: Diana [name], female    DOB: 1960/08/31, 58 y.o.   MRN: [id number]  HPI  Pt presents to the clinic today with c/o anxiety. This is a chronic issue for her, but she reports it is worse in the last 2 weeks. She can not identify that would trigger this. She feels shaky and jittery. She takes Clonazepam as needed but has been taking them daily. She has been on Paxil in the past, but reports it did not work. She would like a referral for a therapist. There is no CSA and UDS on file.  She also c/o external hemorrhoids. She has had hemorrhoids for years. She has noticed some blood in her stool. She denies itching but has had some pain. She has been taking Prep H, but she reports now that is not provided any relief.  She would also like STD screening. She has not had any exposure but is sexually active.  She also reports cold sensation of hands and feet, and would like a complete thyroid panel today.  Review of Systems      Past Medical History:  Diagnosis Date  . Allergy   . Anxiety   . Ear infection    recurrent  . Hypertension     Current Outpatient Medications  Medication Sig Dispense Refill  . Ascorbic Acid (VITAMIN C ADULT GUMMIES) 125 MG CHEW Chew by mouth.    . cetirizine (ZYRTEC) 10 MG tablet Take 10 mg by mouth daily.    . Cholecalciferol (VITAMIN D3) 25 MCG (1000 UT) CAPS Take 5,000 Units by mouth.     . clonazePAM (KLONOPIN) 0.5 MG tablet TAKE 1 TABLET BY MOUTH DAILY AS NEEDED FOR ANXIETY 30 tablet 0  . entecavir (BARACLUDE) 0.5 MG tablet TAKE 1 TABLET BY MOUTH  DAILY ON AN EMPTY STOMACH 2 HOURS BEFORE OR 2 HOURS  AFTER A MEAL 30 tablet 1  . hydrochlorothiazide (MICROZIDE) 12.5 MG capsule TAKE 1 CAPSULE BY MOUTH EVERY DAY 30 capsule 1  . omeprazole (PRILOSEC) 20 MG capsule TAKE 1 CAPSULE BY MOUTH EVERY DAY 30 capsule 5  . potassium chloride (K-DUR) 10 MEQ tablet Take 2 tablets in the morning and 1 tablet in the evening. 90 tablet 2  . Zinc 50 MG  CAPS Take by mouth.     No current facility-administered medications for this visit.     No Known Allergies  Family History  Problem Relation Age of Onset  . Hypertension Mother        alive  . Arthritis Mother   . Lung cancer Father        deceased  . Heart disease Father   . Other Other        1 brother and 1 sister both alive an healthy  . Hypertension Maternal Grandmother     Social History   Socioeconomic History  . Marital status: Married    Spouse name: Not on file  . Number of children: 3  . Years of education: Not on file  . Highest education level: Not on file  Occupational History  . Occupation: Research scientist (physical sciences): [employer]  . Financial resource strain: Not on file  . Food insecurity    Worry: Not on file    Inability: Not on file  . Transportation needs    Medical: Not on file    Non-medical: Not on file  Tobacco Use  . Smoking status:  Former Smoker    Types: Cigarettes    Quit date: 04/20/1985    Years since quitting: 33.6  . Smokeless tobacco: Never Used  . Tobacco comment: remote tobacco use   Substance and Sexual Activity  . Alcohol use: No    Alcohol/week: 0.0 standard drinks  . Drug use: No  . Sexual activity: Never  Lifestyle  . Physical activity    Days per week: Not on file    Minutes per session: Not on file  . Stress: Not on file  Relationships  . Social Musician on phone: Not on file    Gets together: Not on file    Attends religious service: Not on file    Active member of club or organization: Not on file    Attends meetings of clubs or organizations: Not on file    Relationship status: Not on file  . Intimate partner violence    Fear of current or ex partner: Not on file    Emotionally abused: Not on file    Physically abused: Not on file    Forced sexual activity: Not on file  Other Topics Concern  . Not on file  Social History Narrative  . Not on file     Constitutional:  Denies fever, malaise, fatigue, headache or abrupt weight changes.  HEENT: Denies eye pain, eye redness, ear pain, ringing in the ears, wax buildup, runny nose, nasal congestion, bloody nose, or sore throat. Respiratory: Denies difficulty breathing, shortness of breath, cough or sputum production.   Cardiovascular: Denies chest pain, chest tightness, palpitations or swelling in the hands or feet.  Gastrointestinal: Pt reports external hemorrhoid, blood in stool. Denies abdominal pain, bloating, constipation, diarrhea.  GU: Denies urgency, frequency, pain with urination, burning sensation, blood in urine, odor or discharge. Musculoskeletal: Denies decrease in range of motion, difficulty with gait, muscle pain or joint pain and swelling.  Skin: Pt reports cold sensation. Denies redness, rashes, lesions or ulcercations.  Neurological: Pt reports jitteriness. Denies dizziness, difficulty with memory, difficulty with speech or problems with balance and coordination.  Psych: Pt reports anxiety. Denies depression, SI/HI.  No other specific complaints in a complete review of systems (except as listed in HPI above).  Objective:   Physical Exam  BP 116/74   Pulse 73   Temp 98.7 F (37.1 C) (Temporal)   Wt 205 lb (93 kg)   LMP 01/20/2016   SpO2 98%   BMI 32.35 kg/m  Wt Readings from Last 3 Encounters:  12/22/18 205 lb (93 kg)  10/27/18 204 lb 4 oz (92.6 kg)  10/11/18 203 lb (92.1 kg)    General: Appears her stated age, obese, in NAD. Skin: Warm, dry and intact.  Cardiovascular: Normal rate and rhythm. S1,S2 noted.  No murmur, rubs or gallops noted. Cap refill < 3 secs. Pulmonary/Chest: Normal effort and positive vesicular breath sounds. No respiratory distress. No wheezes, rales or ronchi noted.  Abdomen: Soft and nontender. Normal bowel sounds. No distention or masses noted.  Neurological: Alert and oriented. Coordination normal.  Psychiatric: Mildly anxious. Behavior is normal. Judgment  and thought content normal.     BMET    Component Value Date/Time   NA 139 10/11/2018 1117   K 3.6 10/11/2018 1117   CL 97 10/11/2018 1117   CO2 34 (H) 10/11/2018 1117   GLUCOSE 119 (H) 10/11/2018 1117   BUN 13 10/11/2018 1117   CREATININE 0.60 10/11/2018 1117   CALCIUM 9.7 10/11/2018 1117  GFRNONAA >60 07/27/2017 2153   GFRAA >60 07/27/2017 2153    Lipid Panel     Component Value Date/Time   CHOL 214 (H) 10/11/2018 1117   TRIG 78.0 10/11/2018 1117   HDL 23.60 (L) 10/11/2018 1117   CHOLHDL 9 10/11/2018 1117   VLDL 15.6 10/11/2018 1117   LDLCALC 175 (H) 10/11/2018 1117    CBC    Component Value Date/Time   WBC 5.6 10/11/2018 1117   RBC 4.85 10/11/2018 1117   HGB 14.1 10/11/2018 1117   HCT 42.0 10/11/2018 1117   PLT 229.0 10/11/2018 1117   MCV 86.7 10/11/2018 1117   MCH 29.7 07/27/2017 1023   MCHC 33.6 10/11/2018 1117   RDW 14.9 10/11/2018 1117   LYMPHSABS 1.1 07/28/2016 1232   MONOABS 0.4 07/28/2016 1232   EOSABS 0.0 07/28/2016 1232   BASOSABS 0.0 07/28/2016 1232    Hgb A1C Lab Results  Component Value Date   HGBA1C 6.2 10/11/2018            Assessment & Plan:   Screen for STD:  Will check urine gonorrhea, chlamydia and trich Will check HIV, RPR and Hep C Discussed safe sexual intercourse  Shaking:  Reassurance provided that this is benign essential tremor No family history of Parkinson's Will monitor for now  Cold Sensation of Skin:  TSH, Free T4 and T3 per her request  External Hemorrhoid:  RX for Anusol 2.5% cream Drink plenty of water Consume a high fiber diet Can take stool softeners if needed like Colace OTC She wants to consider banding by GI, will follow up with them about this  Return precautions discussed Diana Reaperegina Kandis Henry, NP

## 2018-12-22 NOTE — Assessment & Plan Note (Addendum)
GAD 7 score of 4 CSA and UDS today RX for Sertraline 25 mg PO QHS Continue Clonazepam as needed, not daily Referral to psychiatry placed for CBT CSA and UDS today

## 2018-12-23 LAB — HIV ANTIBODY (ROUTINE TESTING W REFLEX): HIV 1&2 Ab, 4th Generation: NONREACTIVE

## 2018-12-23 LAB — RPR: RPR Ser Ql: NONREACTIVE

## 2018-12-23 LAB — HEPATITIS C ANTIBODY
Hepatitis C Ab: NONREACTIVE
SIGNAL TO CUT-OFF: 0 (ref <1.00)

## 2018-12-23 LAB — T3: T3, Total: 139 ng/dL (ref 76–181)

## 2018-12-28 LAB — PAIN MGMT, PROFILE 8 W/CONF, U
6 Acetylmorphine: NEGATIVE ng/mL
Alcohol Metabolites: NEGATIVE ng/mL (ref <500)
Alphahydroxyalprazolam: NEGATIVE ng/mL
Alphahydroxymidazolam: NEGATIVE ng/mL
Alphahydroxytriazolam: NEGATIVE ng/mL
Aminoclonazepam: 195 ng/mL
Amphetamines: NEGATIVE ng/mL
Benzodiazepines: POSITIVE ng/mL
Buprenorphine, Urine: NEGATIVE ng/mL
Cocaine Metabolite: NEGATIVE ng/mL
Creatinine: 242.1 mg/dL
Hydroxyethylflurazepam: NEGATIVE ng/mL
Lorazepam: NEGATIVE ng/mL
MDMA: NEGATIVE ng/mL
Marijuana Metabolite: NEGATIVE ng/mL
Nordiazepam: NEGATIVE ng/mL
Opiates: NEGATIVE ng/mL
Oxazepam: NEGATIVE ng/mL
Oxidant: NEGATIVE ug/mL
Oxycodone: NEGATIVE ng/mL
Temazepam: NEGATIVE ng/mL
pH: 5.4 (ref 4.5–9.0)

## 2018-12-28 LAB — C. TRACHOMATIS/N. GONORRHOEAE RNA
C. trachomatis RNA, TMA: NOT DETECTED
N. gonorrhoeae RNA, TMA: NOT DETECTED

## 2018-12-28 LAB — TRICHOMONAS VAGINALIS, PROBE AMP: Trichomonas vaginalis RNA: NOT DETECTED

## 2019-01-11 ENCOUNTER — Other Ambulatory Visit: Payer: Self-pay | Admitting: Gastroenterology

## 2019-01-11 DIAGNOSIS — L821 Other seborrheic keratosis: Secondary | ICD-10-CM | POA: Diagnosis not present

## 2019-01-11 DIAGNOSIS — D485 Neoplasm of uncertain behavior of skin: Secondary | ICD-10-CM | POA: Diagnosis not present

## 2019-01-11 DIAGNOSIS — D1801 Hemangioma of skin and subcutaneous tissue: Secondary | ICD-10-CM | POA: Diagnosis not present

## 2019-01-11 DIAGNOSIS — D225 Melanocytic nevi of trunk: Secondary | ICD-10-CM | POA: Diagnosis not present

## 2019-01-11 DIAGNOSIS — L814 Other melanin hyperpigmentation: Secondary | ICD-10-CM | POA: Diagnosis not present

## 2019-01-13 ENCOUNTER — Other Ambulatory Visit: Payer: Self-pay | Admitting: Internal Medicine

## 2019-01-18 DIAGNOSIS — K625 Hemorrhage of anus and rectum: Secondary | ICD-10-CM | POA: Diagnosis not present

## 2019-01-18 DIAGNOSIS — K5904 Chronic idiopathic constipation: Secondary | ICD-10-CM | POA: Diagnosis not present

## 2019-01-18 DIAGNOSIS — K644 Residual hemorrhoidal skin tags: Secondary | ICD-10-CM | POA: Diagnosis not present

## 2019-01-19 ENCOUNTER — Telehealth: Payer: Self-pay

## 2019-01-19 NOTE — Telephone Encounter (Signed)
Left detailed VM w COVID screen and back door lab info   

## 2019-01-23 ENCOUNTER — Other Ambulatory Visit: Payer: BC Managed Care – PPO

## 2019-01-30 ENCOUNTER — Other Ambulatory Visit: Payer: BC Managed Care – PPO

## 2019-02-08 ENCOUNTER — Other Ambulatory Visit: Payer: Self-pay

## 2019-02-08 ENCOUNTER — Encounter: Payer: Self-pay | Admitting: *Deleted

## 2019-02-08 ENCOUNTER — Ambulatory Visit: Payer: BC Managed Care – PPO | Admitting: Gastroenterology

## 2019-02-09 ENCOUNTER — Other Ambulatory Visit (INDEPENDENT_AMBULATORY_CARE_PROVIDER_SITE_OTHER): Payer: BC Managed Care – PPO

## 2019-02-09 ENCOUNTER — Other Ambulatory Visit: Payer: BC Managed Care – PPO

## 2019-02-09 DIAGNOSIS — R7309 Other abnormal glucose: Secondary | ICD-10-CM

## 2019-02-09 DIAGNOSIS — E782 Mixed hyperlipidemia: Secondary | ICD-10-CM | POA: Diagnosis not present

## 2019-02-09 LAB — LIPID PANEL
Cholesterol: 193 mg/dL (ref 0–200)
HDL: 18 mg/dL — ABNORMAL LOW (ref >39.00)
LDL Cholesterol: 154 mg/dL — ABNORMAL HIGH (ref 0–99)
NonHDL: 175.22
Total CHOL/HDL Ratio: 11
Triglycerides: 105 mg/dL (ref 0.0–149.0)
VLDL: 21 mg/dL (ref 0.0–40.0)

## 2019-02-09 LAB — HEMOGLOBIN A1C: Hgb A1c MFr Bld: 5.9 % (ref 4.6–6.5)

## 2019-02-10 ENCOUNTER — Encounter: Payer: Self-pay | Admitting: Internal Medicine

## 2019-02-10 ENCOUNTER — Other Ambulatory Visit: Payer: Self-pay

## 2019-02-10 ENCOUNTER — Ambulatory Visit (INDEPENDENT_AMBULATORY_CARE_PROVIDER_SITE_OTHER): Payer: BC Managed Care – PPO | Admitting: Internal Medicine

## 2019-02-10 VITALS — BP 124/72 | HR 74 | Temp 97.9°F

## 2019-02-10 DIAGNOSIS — M545 Low back pain, unspecified: Secondary | ICD-10-CM

## 2019-02-10 DIAGNOSIS — G250 Essential tremor: Secondary | ICD-10-CM

## 2019-02-10 MED ORDER — CYCLOBENZAPRINE HCL 5 MG PO TABS: 5.0000 mg | ORAL_TABLET | Freq: Every evening | ORAL | 0 refills | Status: DC | PRN

## 2019-02-10 NOTE — Progress Notes (Signed)
Subjective:    Patient ID: Diana [name], female    DOB: 03/20/1961, 58 y.o.   MRN: [id number]  HPI  Pt presents to the clinic today with c/o "tremors in hands". This started about 3-4 weeks ago. She reports it occurs during the day and at night. She notices it more when she is trying to hold her phone up and is scrolling. She denies any numbness, tingling or weakness of her upper extremities. She denies any neck pain at this time. She has not taken anything OTC for this.  She also reports a flare up of low back pain. She reports this started 3 days ago. She describes the pain as burning. The pain radiates into the back of her thighs. The pain is worse with sitting, better with laying down or standing up. She denies numbness, tingling, weakness or loss of bowel/bladder control. She denies any recent injury to the area but reports this flares up from time to time. She has tried heat, Ibuprofen with minimal relief.   Review of Systems      Past Medical History:  Diagnosis Date  . Allergy   . Anxiety   . Ear infection    recurrent  . Hypertension     Current Outpatient Medications  Medication Sig Dispense Refill  . Ascorbic Acid (VITAMIN C ADULT GUMMIES) 125 MG CHEW Chew by mouth.    . cetirizine (ZYRTEC) 10 MG tablet Take 10 mg by mouth daily.    . Cholecalciferol (VITAMIN D3) 25 MCG (1000 UT) CAPS Take 5,000 Units by mouth.     . clonazePAM (KLONOPIN) 0.5 MG tablet TAKE 1 TABLET BY MOUTH DAILY AS NEEDED FOR ANXIETY 30 tablet 0  . entecavir (BARACLUDE) 0.5 MG tablet TAKE 1 TABLET BY MOUTH  DAILY ON AN EMPTY STOMACH 2 HOURS BEFORE OR 2 HOURS  AFTER A MEAL 30 tablet 1  . hydrochlorothiazide (MICROZIDE) 12.5 MG capsule TAKE 1 CAPSULE BY MOUTH EVERY DAY 30 capsule 2  . hydrocortisone (ANUSOL-HC) 2.5 % rectal cream Place 1 application rectally 2 (two) times daily. 30 g 0  . hydrocortisone 2.5 % cream     . omeprazole (PRILOSEC) 20 MG capsule TAKE 1 CAPSULE BY MOUTH EVERY DAY 30 capsule  5  . potassium chloride (K-DUR) 10 MEQ tablet Take 2 tablets in the morning and 1 tablet in the evening. 90 tablet 2  . sertraline (ZOLOFT) 50 MG tablet Take 0.5 tablets (25 mg total) by mouth daily. 30 tablet 2  . TRILYTE 420 g solution     . Zinc 50 MG CAPS Take by mouth.     No current facility-administered medications for this visit.     No Known Allergies  Family History  Problem Relation Age of Onset  . Hypertension Mother        alive  . Arthritis Mother   . Lung cancer Father        deceased  . Heart disease Father   . Other Other        1 brother and 1 sister both alive an healthy  . Hypertension Maternal Grandmother     Social History   Socioeconomic History  . Marital status: Married    Spouse name: Not on file  . Number of children: 3  . Years of education: Not on file  . Highest education level: Not on file  Occupational History  . Occupation: Nutritional therapistcustomer service    Employer: [employer]  Social Needs  . Financial  resource strain: Not on file  . Food insecurity    Worry: Not on file    Inability: Not on file  . Transportation needs    Medical: Not on file    Non-medical: Not on file  Tobacco Use  . Smoking status: Former Smoker    Types: Cigarettes    Quit date: 04/20/1985    Years since quitting: 33.8  . Smokeless tobacco: Never Used  . Tobacco comment: remote tobacco use   Substance and Sexual Activity  . Alcohol use: No    Alcohol/week: 0.0 standard drinks  . Drug use: No  . Sexual activity: Never  Lifestyle  . Physical activity    Days per week: Not on file    Minutes per session: Not on file  . Stress: Not on file  Relationships  . Social Musician on phone: Not on file    Gets together: Not on file    Attends religious service: Not on file    Active member of club or organization: Not on file    Attends meetings of clubs or organizations: Not on file    Relationship status: Not on file  . Intimate partner violence    Fear  of current or ex partner: Not on file    Emotionally abused: Not on file    Physically abused: Not on file    Forced sexual activity: Not on file  Other Topics Concern  . Not on file  Social History Narrative  . Not on file     Constitutional: Denies fever, malaise, fatigue, headache or abrupt weight changes.  Respiratory: Denies difficulty breathing, shortness of breath, cough or sputum production.   Cardiovascular: Denies chest pain, chest tightness, palpitations or swelling in the hands or feet.  Gastrointestinal: Denies abdominal pain, bloating, constipation, diarrhea or blood in the stool.  GU: Denies urgency, frequency, pain with urination, burning sensation, blood in urine, odor or discharge. Musculoskeletal: Pt reports low back pain. Denies difficulty with gait, muscle pain or joint  swelling.  Skin: Denies redness, rashes, lesions or ulcercations.  Neurological: Pt reports tremors. Denies dizziness, difficulty with memory, difficulty with speech or problems with balance and coordination.    No other specific complaints in a complete review of systems (except as listed in HPI above).  Objective:   Physical Exam  BP 124/72   Pulse 74   Temp 97.9 F (36.6 C)   LMP 01/20/2016   SpO2 97%   Wt Readings from Last 3 Encounters:  12/22/18 205 lb (93 kg)  10/27/18 204 lb 4 oz (92.6 kg)  10/11/18 203 lb (92.1 kg)    General: Appears her stated age, obese, in NAD. Skin: Warm, dry and intact. No rashes noted. Cardiovascular: Normal rate and rhythm.  Pulmonary/Chest: Normal effort and positive vesicular breath sounds. No respiratory distress. No wheezes, rales or ronchi noted.  Musculoskeletal: Decreased flexion of the lumbar spine. Normal extension and rotation. Bony tenderness noted over the lumbar spine. Strength 5/5 BLE. No difficulty with gait. Neurological: Alert and oriented. No asterixis or noticeable resting tremor. Coordination normal.   BMET    Component Value  Date/Time   NA 139 10/11/2018 1117   K 3.6 10/11/2018 1117   CL 97 10/11/2018 1117   CO2 34 (H) 10/11/2018 1117   GLUCOSE 119 (H) 10/11/2018 1117   BUN 13 10/11/2018 1117   CREATININE 0.60 10/11/2018 1117   CALCIUM 9.7 10/11/2018 1117   GFRNONAA >60 07/27/2017 2153  GFRAA >60 07/27/2017 2153    Lipid Panel     Component Value Date/Time   CHOL 193 02/09/2019 1245   TRIG 105.0 02/09/2019 1245   HDL 18.00 (L) 02/09/2019 1245   CHOLHDL 11 02/09/2019 1245   VLDL 21.0 02/09/2019 1245   LDLCALC 154 (H) 02/09/2019 1245    CBC    Component Value Date/Time   WBC 5.6 10/11/2018 1117   RBC 4.85 10/11/2018 1117   HGB 14.1 10/11/2018 1117   HCT 42.0 10/11/2018 1117   PLT 229.0 10/11/2018 1117   MCV 86.7 10/11/2018 1117   MCH 29.7 07/27/2017 1023   MCHC 33.6 10/11/2018 1117   RDW 14.9 10/11/2018 1117   LYMPHSABS 1.1 07/28/2016 1232   MONOABS 0.4 07/28/2016 1232   EOSABS 0.0 07/28/2016 1232   BASOSABS 0.0 07/28/2016 1232    Hgb A1C Lab Results  Component Value Date   HGBA1C 5.9 02/09/2019           Assessment & Plan:   Essential Tremor:  Advised her this was a benign condition.  Advised her we could try Propanolol for symptomatic treatment but she declines at this time She declines referral to neurology for further evaluation at this time  Acute Back Pain:  Encouraged stretching Heat and massage may be helpful Ibuprofen 600 mg TID prn- take with food and avoid additional NSAID's OTC RX for Flexeril 5 mg TID prn- sedation caution given Back exercises given  Return precautions discussed Webb Silversmith, NP

## 2019-02-10 NOTE — Patient Instructions (Signed)

## 2019-03-07 ENCOUNTER — Other Ambulatory Visit: Payer: Self-pay

## 2019-03-07 ENCOUNTER — Encounter: Payer: Self-pay | Admitting: Family Medicine

## 2019-03-07 ENCOUNTER — Ambulatory Visit (INDEPENDENT_AMBULATORY_CARE_PROVIDER_SITE_OTHER): Payer: BC Managed Care – PPO | Admitting: Family Medicine

## 2019-03-07 DIAGNOSIS — L0202 Furuncle of face: Secondary | ICD-10-CM

## 2019-03-07 MED ORDER — MUPIROCIN 2 % EX OINT: 1.0000 "application " | TOPICAL_OINTMENT | Freq: Two times a day (BID) | CUTANEOUS | 0 refills | Status: DC

## 2019-03-07 MED ORDER — CEPHALEXIN 500 MG PO CAPS: 500.0000 mg | ORAL_CAPSULE | Freq: Three times a day (TID) | ORAL | 0 refills | Status: DC

## 2019-03-07 NOTE — Patient Instructions (Signed)
Keep the wound/boil area clean with soap and water  Use the bactroban ointment after cleaning  Warm compress is helpful also Take the oral keflex as directed   Update if not starting to improve in a week or if worsening  If increase in size or redness or pain let us know

## 2019-03-07 NOTE — Assessment & Plan Note (Signed)
Likely started as comedone tx with keflex  bactroban ointment- several times daily after soap/water cleanse Warm compress prn  Update if not starting to improve in a week or if worsening  Esp if inc in redness/swelling or pain  Did warn pt that it may drain more

## 2019-03-07 NOTE — Progress Notes (Signed)
Subjective:    Patient ID: Diana [name], female    DOB: 1961/02/04, 58 y.o.   MRN: [id number]  HPI 58 yo pt of NP Baity here with a bump on her face  R side of face   Started on Wednesday noticed a bump just under the skin  Got bigger as the days went on  Thought it was going to come to a head  Then had a little drainage of white material and blood Is sore to the touch  She used warm compresses   No fever Feels fine No h/o MRSA  Patient Active Problem List   Diagnosis Date Noted  . Boil, face 03/07/2019  . Intermittent low back pain 10/11/2018  . Chronic hepatitis B virus infection ([place]) 11/10/2017  . GERD (gastroesophageal reflux disease) 06/09/2016  . Essential hypertension 03/07/2015  . Generalized anxiety disorder 03/07/2015   Past Medical History:  Diagnosis Date  . Allergy   . Anxiety   . Ear infection    recurrent  . Hypertension    Past Surgical History:  Procedure Laterality Date  . ABDOMINAL HYSTERECTOMY  2019   partial  . TONSILLECTOMY    . TUBAL LIGATION     bilateral   Social History   Tobacco Use  . Smoking status: Former Smoker    Types: Cigarettes    Quit date: 04/20/1985    Years since quitting: 33.9  . Smokeless tobacco: Never Used  . Tobacco comment: remote tobacco use   Substance Use Topics  . Alcohol use: No    Alcohol/week: 0.0 standard drinks  . Drug use: No   Family History  Problem Relation Age of Onset  . Hypertension Mother        alive  . Arthritis Mother   . Lung cancer Father        deceased  . Heart disease Father   . Other Other        1 brother and 1 sister both alive an healthy  . Hypertension Maternal Grandmother    No Known Allergies Current Outpatient Medications on File Prior to Visit  Medication Sig Dispense Refill  . Ascorbic Acid (VITAMIN C ADULT GUMMIES) 125 MG CHEW Chew by mouth.    . cetirizine (ZYRTEC) 10 MG tablet Take 10 mg by mouth daily.    . Cholecalciferol (VITAMIN D3) 25 MCG (1000 UT)  CAPS Take 5,000 Units by mouth.     . clonazePAM (KLONOPIN) 0.5 MG tablet TAKE 1 TABLET BY MOUTH DAILY AS NEEDED FOR ANXIETY 30 tablet 0  . entecavir (BARACLUDE) 0.5 MG tablet TAKE 1 TABLET BY MOUTH  DAILY ON AN EMPTY STOMACH 2 HOURS BEFORE OR 2 HOURS  AFTER A MEAL 30 tablet 1  . hydrochlorothiazide (MICROZIDE) 12.5 MG capsule TAKE 1 CAPSULE BY MOUTH EVERY DAY 30 capsule 2  . hydrocortisone (ANUSOL-HC) 2.5 % rectal cream Place 1 application rectally 2 (two) times daily. 30 g 0  . hydrocortisone 2.5 % cream     . omeprazole (PRILOSEC) 20 MG capsule TAKE 1 CAPSULE BY MOUTH EVERY DAY 30 capsule 5  . potassium chloride (K-DUR) 10 MEQ tablet Take 2 tablets in the morning and 1 tablet in the evening. 90 tablet 2  . sertraline (ZOLOFT) 50 MG tablet Take 0.5 tablets (25 mg total) by mouth daily. 30 tablet 2  . Zinc 50 MG CAPS Take by mouth.     No current facility-administered medications on file prior to visit.  Review of Systems  Constitutional: Negative for activity change, appetite change, fatigue, fever and unexpected weight change.  HENT: Negative for congestion, ear pain, rhinorrhea, sinus pressure and sore throat.   Eyes: Negative for pain, redness and visual disturbance.  Respiratory: Negative for cough, shortness of breath and wheezing.   Cardiovascular: Negative for chest pain and palpitations.  Gastrointestinal: Negative for abdominal pain, blood in stool, constipation and diarrhea.  Endocrine: Negative for polydipsia and polyuria.  Genitourinary: Negative for dysuria, frequency and urgency.  Musculoskeletal: Negative for arthralgias, back pain and myalgias.  Skin: Negative for pallor and rash.       Skin pimple/infected on face  Allergic/Immunologic: Negative for environmental allergies.  Neurological: Negative for dizziness, syncope and headaches.  Hematological: Negative for adenopathy. Does not bruise/bleed easily.  Psychiatric/Behavioral: Negative for decreased concentration  and dysphoric mood. The patient is not nervous/anxious.        Objective:   Physical Exam Constitutional:      General: She is not in acute distress.    Appearance: Normal appearance. She is obese. She is not ill-appearing.  HENT:     Head: Normocephalic and atraumatic.     Mouth/Throat:     Mouth: Mucous membranes are moist.  Eyes:     General: No scleral icterus.       Right eye: No discharge.        Left eye: No discharge.     Extraocular Movements: Extraocular movements intact.     Conjunctiva/sclera: Conjunctivae normal.     Pupils: Pupils are equal, round, and reactive to light.  Neck:     Musculoskeletal: Normal range of motion.  Cardiovascular:     Rate and Rhythm: Normal rate and regular rhythm.  Pulmonary:     Effort: Pulmonary effort is normal. No respiratory distress.  Lymphadenopathy:     Cervical: No cervical adenopathy.  Skin:    General: Skin is warm and dry.     Findings: No rash.     Comments: 1 cm infected comedone/boil with white head on R face just adjacent to nose  Erythematous and mildly tender Able to debride scab and express small amt of pus  No streaking or generalized swelling outside the lesion  Neurological:     Mental Status: She is alert.     Cranial Nerves: No cranial nerve deficit.  Psychiatric:        Mood and Affect: Mood normal.           Assessment & Plan:   Problem List Items Addressed This Visit      Musculoskeletal and Integument   Boil, face    Likely started as comedone tx with keflex  bactroban ointment- several times daily after soap/water cleanse Warm compress prn  Update if not starting to improve in a week or if worsening  Esp if inc in redness/swelling or pain  Did warn pt that it may drain more      Relevant Medications   cephALEXin (KEFLEX) 500 MG capsule   mupirocin ointment (BACTROBAN) 2 %

## 2019-03-28 ENCOUNTER — Encounter: Payer: Self-pay | Admitting: Internal Medicine

## 2019-03-28 ENCOUNTER — Ambulatory Visit (INDEPENDENT_AMBULATORY_CARE_PROVIDER_SITE_OTHER): Payer: BC Managed Care – PPO | Admitting: Internal Medicine

## 2019-03-28 ENCOUNTER — Other Ambulatory Visit: Payer: Self-pay

## 2019-03-28 VITALS — BP 124/76 | HR 77 | Wt 204.0 lb

## 2019-03-28 DIAGNOSIS — L987 Excessive and redundant skin and subcutaneous tissue: Secondary | ICD-10-CM

## 2019-03-28 DIAGNOSIS — L0202 Furuncle of face: Secondary | ICD-10-CM

## 2019-03-28 DIAGNOSIS — M7711 Lateral epicondylitis, right elbow: Secondary | ICD-10-CM

## 2019-03-28 MED ORDER — CEPHALEXIN 500 MG PO CAPS: 500.0000 mg | ORAL_CAPSULE | Freq: Three times a day (TID) | ORAL | 0 refills | Status: DC

## 2019-03-28 MED ORDER — CLONAZEPAM 0.5 MG PO TABS: 0.5000 mg | ORAL_TABLET | Freq: Every day | ORAL | 0 refills | Status: DC | PRN

## 2019-03-28 MED ORDER — HYDROCHLOROTHIAZIDE 12.5 MG PO CAPS: ORAL_CAPSULE | ORAL | 2 refills | Status: DC

## 2019-03-28 NOTE — Patient Instructions (Signed)

## 2019-03-28 NOTE — Progress Notes (Signed)
Subjective:    Patient ID: Diana [name], female    DOB: [birth year], 58 y.o.   MRN: [id number]  HPI  Pt presents to the clinic today with c/o a skin lesion of her face. She was seen 03/07/19 for the same, diagnosed with a boil. She was treated with Keflex 500 mg TID x 7 days. She has noticed some improvement but is concerned about the persistent bump and hyperpigmentation. She has not noticed any drainage from the area. She denies fever, chills. She has been putting RX antibiotic ointment on the area without any relief.  She would like a refill of HCTZ and Clonazepam today.  She also reports right upper arm pain. This started a few weeks ago. She describes the pain as sore and achy. She denies numbness, tingling or weakness. The pain is worse with movement or lifting. She denies any injury to the area. She has not taken anything OTC for this.  She would like to know if there is a cream to help tighten the skin under her neck. It is loose and hangs and she does not like the way it looks.   Review of Systems      Past Medical History:  Diagnosis Date  . Allergy   . Anxiety   . Ear infection    recurrent  . Hypertension     Current Outpatient Medications  Medication Sig Dispense Refill  . Ascorbic Acid (VITAMIN C ADULT GUMMIES) 125 MG CHEW Chew by mouth.    . cephALEXin (KEFLEX) 500 MG capsule Take 1 capsule (500 mg total) by mouth 3 (three) times daily. 21 capsule 0  . cetirizine (ZYRTEC) 10 MG tablet Take 10 mg by mouth daily.    . Cholecalciferol (VITAMIN D3) 25 MCG (1000 UT) CAPS Take 5,000 Units by mouth.     . clonazePAM (KLONOPIN) 0.5 MG tablet TAKE 1 TABLET BY MOUTH DAILY AS NEEDED FOR ANXIETY 30 tablet 0  . entecavir (BARACLUDE) 0.5 MG tablet TAKE 1 TABLET BY MOUTH  DAILY ON AN EMPTY STOMACH 2 HOURS BEFORE OR 2 HOURS  AFTER A MEAL 30 tablet 1  . hydrochlorothiazide (MICROZIDE) 12.5 MG capsule TAKE 1 CAPSULE BY MOUTH EVERY DAY 30 capsule 2  . hydrocortisone (ANUSOL-HC) 2.5  % rectal cream Place 1 application rectally 2 (two) times daily. 30 g 0  . hydrocortisone 2.5 % cream     . mupirocin ointment (BACTROBAN) 2 % Apply 1 application topically 2 (two) times daily. To affected skin area 15 g 0  . omeprazole (PRILOSEC) 20 MG capsule TAKE 1 CAPSULE BY MOUTH EVERY DAY 30 capsule 5  . potassium chloride (K-DUR) 10 MEQ tablet Take 2 tablets in the morning and 1 tablet in the evening. 90 tablet 2  . sertraline (ZOLOFT) 50 MG tablet Take 0.5 tablets (25 mg total) by mouth daily. 30 tablet 2  . Zinc 50 MG CAPS Take by mouth.     No current facility-administered medications for this visit.     No Known Allergies  Family History  Problem Relation Age of Onset  . Hypertension Mother        alive  . Arthritis Mother   . Lung cancer Father        deceased  . Heart disease Father   . Other Other        1 brother and 1 sister both alive an healthy  . Hypertension Maternal Grandmother     Social History   Socioeconomic History  .  Marital status: Married    Spouse name: Not on file  . Number of children: 3  . Years of education: Not on file  . Highest education level: Not on file  Occupational History  . Occupation: Nutritional therapist: [employer]  Social Needs  . Financial resource strain: Not on file  . Food insecurity    Worry: Not on file    Inability: Not on file  . Transportation needs    Medical: Not on file    Non-medical: Not on file  Tobacco Use  . Smoking status: Former Smoker    Types: Cigarettes    Quit date: 04/20/1985    Years since quitting: 33.9  . Smokeless tobacco: Never Used  . Tobacco comment: remote tobacco use   Substance and Sexual Activity  . Alcohol use: No    Alcohol/week: 0.0 standard drinks  . Drug use: No  . Sexual activity: Never  Lifestyle  . Physical activity    Days per week: Not on file    Minutes per session: Not on file  . Stress: Not on file  Relationships  . Social Musician on  phone: Not on file    Gets together: Not on file    Attends religious service: Not on file    Active member of club or organization: Not on file    Attends meetings of clubs or organizations: Not on file    Relationship status: Not on file  . Intimate partner violence    Fear of current or ex partner: Not on file    Emotionally abused: Not on file    Physically abused: Not on file    Forced sexual activity: Not on file  Other Topics Concern  . Not on file  Social History Narrative  . Not on file     Constitutional: Denies fever, malaise, fatigue, headache or abrupt weight changes.  Respiratory: Denies difficulty breathing, shortness of breath, cough or sputum production.   Cardiovascular: Denies chest pain, chest tightness, palpitations or swelling in the hands or feet.  Musculoskeletal: Pt reports pain in right forearm. Denies decrease in range of motion, difficulty with gait, muscle pain or joint swelling.  Skin: Pt reports skin lesion of face, excessive skin under chin. Denies redness, rashes, or ulcercations.    No other specific complaints in a complete review of systems (except as listed in HPI above).  Objective:   Physical Exam  BP 124/76   Pulse 77   Wt 204 lb (92.5 kg)   LMP 01/20/2016   BMI 32.19 kg/m   Wt Readings from Last 3 Encounters:  03/07/19 204 lb (92.5 kg)  12/22/18 205 lb (93 kg)  10/27/18 204 lb 4 oz (92.6 kg)    General: Appears her stated age, obese, in NAD. Skin: 1 cm hyperpigmented raised lesion of right upper lip. Excessive skin noted of the neck. Cardiovascular: Normal rate and rhythm.  Pulmonary/Chest: Normal effort and positive vesicular breath sounds. No respiratory distress. No wheezes, rales or ronchi noted.  MSK: Normal flexion, extension and rotation of the right elbow. Pain with palpation over the lateral epicondyle. Strength 5/5 BUE/BLE. Neurological: Alert and oriented.  Psychiatric: Mood and affect normal. Behavior is normal.  Judgment and thought content normal.     BMET    Component Value Date/Time   NA 139 10/11/2018 1117   K 3.6 10/11/2018 1117   CL 97 10/11/2018 1117   CO2 34 (H) 10/11/2018  1117   GLUCOSE 119 (H) 10/11/2018 1117   BUN 13 10/11/2018 1117   CREATININE 0.60 10/11/2018 1117   CALCIUM 9.7 10/11/2018 1117   GFRNONAA >60 07/27/2017 2153   GFRAA >60 07/27/2017 2153    Lipid Panel     Component Value Date/Time   CHOL 193 02/09/2019 1245   TRIG 105.0 02/09/2019 1245   HDL 18.00 (L) 02/09/2019 1245   CHOLHDL 11 02/09/2019 1245   VLDL 21.0 02/09/2019 1245   LDLCALC 154 (H) 02/09/2019 1245    CBC    Component Value Date/Time   WBC 5.6 10/11/2018 1117   RBC 4.85 10/11/2018 1117   HGB 14.1 10/11/2018 1117   HCT 42.0 10/11/2018 1117   PLT 229.0 10/11/2018 1117   MCV 86.7 10/11/2018 1117   MCH 29.7 07/27/2017 1023   MCHC 33.6 10/11/2018 1117   RDW 14.9 10/11/2018 1117   LYMPHSABS 1.1 07/28/2016 1232   MONOABS 0.4 07/28/2016 1232   EOSABS 0.0 07/28/2016 1232   BASOSABS 0.0 07/28/2016 1232    Hgb A1C Lab Results  Component Value Date   HGBA1C 5.9 02/09/2019            Assessment & Plan:   Lateral Epicondylitis:  Avoid overuse, encouraged rest Ibuprofen 400 mg BID x 1 week Apply ice as needed for comfort  Boil of Face:  Not fully resolved Extend abx for 5 more days RX for Keflex 500 mg TID x 5 days Warm compresses prn  Excessive Skin of Neck:  Advised her to try Collagen supplement OTC Otherwise she would need referral for cosmetic surgery  Refilled Clonazepam and HCTZ  Nicki Reaperegina Deanza Upperman, NP This visit occurred during the SARS-CoV-2 public health emergency.  Safety protocols were in place, including screening questions prior to the visit, additional usage of staff PPE, and extensive cleaning of exam room while observing appropriate contact time as indicated for disinfecting solutions.

## 2019-04-04 ENCOUNTER — Telehealth: Payer: Self-pay | Admitting: Gastroenterology

## 2019-04-04 NOTE — Telephone Encounter (Signed)
Spoke with pt concerning patient assistance for medication from [place], pt stated she will no longer have insurance as of January 2021, I contacted specialty pharmacy and pt has been flagged and will be contacted to initiate patient assistance program telephone number to pt assistance has been provided to pt. Patient verbalized understanding.

## 2019-04-04 NOTE — Telephone Encounter (Signed)
Pt left vm stating she had put in a request to speak to Dr. Verlin Grills nurse or Dr. Marius Ditch and has not heard anything please call pt

## 2019-04-06 ENCOUNTER — Encounter: Payer: Self-pay | Admitting: Gastroenterology

## 2019-04-06 ENCOUNTER — Other Ambulatory Visit: Payer: Self-pay

## 2019-04-06 ENCOUNTER — Ambulatory Visit (INDEPENDENT_AMBULATORY_CARE_PROVIDER_SITE_OTHER): Payer: BC Managed Care – PPO | Admitting: Gastroenterology

## 2019-04-06 ENCOUNTER — Ambulatory Visit (INDEPENDENT_AMBULATORY_CARE_PROVIDER_SITE_OTHER): Payer: BC Managed Care – PPO | Admitting: Psychology

## 2019-04-06 VITALS — BP 139/85 | HR 70 | Temp 98.2°F | Wt 208.2 lb

## 2019-04-06 DIAGNOSIS — B181 Chronic viral hepatitis B without delta-agent: Secondary | ICD-10-CM

## 2019-04-06 DIAGNOSIS — Z8619 Personal history of other infectious and parasitic diseases: Secondary | ICD-10-CM

## 2019-04-06 DIAGNOSIS — F4323 Adjustment disorder with mixed anxiety and depressed mood: Secondary | ICD-10-CM | POA: Diagnosis not present

## 2019-04-06 DIAGNOSIS — K644 Residual hemorrhoidal skin tags: Secondary | ICD-10-CM | POA: Diagnosis not present

## 2019-04-06 NOTE — Progress Notes (Signed)
Melodie Bouillon, MD [address]  [address]  [place], [place] [address]  Main: [phone]  Fax: [phone]   Primary Care Physician: [name], NP   Chief Complaint  Patient presents with  . Hemorrhoids    Only has problems if she has a flair up     HPI: Diana [name] is a 58 y.o. female, patient of Dr. Verdis Prime, follows her for hepatitis B treatment, with appointment made with me today due to her complaints of hemorrhoids.  However, patient states that she already had this evaluated by [facility] 2 months ago as an appointment with [place] was not available right away.  She states they advised her that she would need surgical resection of the hemorrhoid and she is not interested in the mass, and does not want any further evaluation of the hemorrhoid by [place] today.  She reports feeling external hemorrhoid that is painless, not having any bleeding from it at this time.  No weight loss.  Compliant with her hepatitis B chronic therapy  Current Outpatient Medications  Medication Sig Dispense Refill  . Ascorbic Acid (VITAMIN C ADULT GUMMIES) 125 MG CHEW Chew by mouth.    . cephALEXin (KEFLEX) 500 MG capsule Take 1 capsule (500 mg total) by mouth 3 (three) times daily. 15 capsule 0  . cetirizine (ZYRTEC) 10 MG tablet Take 10 mg by mouth daily.    . Cholecalciferol (VITAMIN D3) 25 MCG (1000 UT) CAPS Take 5,000 Units by mouth.     . clonazePAM (KLONOPIN) 0.5 MG tablet Take 1 tablet (0.5 mg total) by mouth daily as needed. for anxiety 30 tablet 0  . entecavir (BARACLUDE) 0.5 MG tablet TAKE 1 TABLET BY MOUTH  DAILY ON AN EMPTY STOMACH 2 HOURS BEFORE OR 2 HOURS  AFTER A MEAL 30 tablet 1  . hydrochlorothiazide (MICROZIDE) 12.5 MG capsule TAKE 1 CAPSULE BY MOUTH EVERY DAY 30 capsule 2  . hydrocortisone (ANUSOL-HC) 2.5 % rectal cream Place 1 application rectally 2 (two) times daily. 30 g 0  . mupirocin ointment (BACTROBAN) 2 % Apply 1 application topically 2 (two) times daily. To  affected skin area 15 g 0  . omeprazole (PRILOSEC) 20 MG capsule TAKE 1 CAPSULE BY MOUTH EVERY DAY 30 capsule 5  . Zinc 50 MG CAPS Take by mouth.     No current facility-administered medications for this visit.    Allergies as of 04/06/2019  . (No Known Allergies)    ROS:  General: Negative for anorexia, weight loss, fever, chills, fatigue, weakness. ENT: Negative for hoarseness, difficulty swallowing , nasal congestion. CV: Negative for chest pain, angina, palpitations, dyspnea on exertion, peripheral edema.  Respiratory: Negative for dyspnea at rest, dyspnea on exertion, cough, sputum, wheezing.  GI: See history of present illness. GU:  Negative for dysuria, hematuria, urinary incontinence, urinary frequency, nocturnal urination.  Endo: Negative for unusual weight change.    Physical Examination:   BP 139/85 (BP Location: Left Arm, Patient Position: Sitting, Cuff Size: Normal)   Pulse 70   Temp 98.2 F (36.8 C) (Oral)   Wt 208 lb 4 oz (94.5 kg)   LMP 01/20/2016   BMI 32.86 kg/m   General: Well-nourished, well-developed in no acute distress.  Eyes: No icterus. Conjunctivae pink. Mouth: Oropharyngeal mucosa moist and pink , no lesions erythema or exudate. Neck: Supple, Trachea midline Abdomen: Bowel sounds are normal, nontender, nondistended, no hepatosplenomegaly or masses, no abdominal bruits or hernia , no rebound or guarding.  Extremities: No lower extremity edema. No clubbing or deformities. Neuro: Alert and oriented x 3.  Grossly intact. Skin: Warm and dry, no jaundice.   Psych: Alert and cooperative, normal mood and affect.   Labs: CMP     Component Value Date/Time   NA 139 10/11/2018 1117   K 3.6 10/11/2018 1117   CL 97 10/11/2018 1117   CO2 34 (H) 10/11/2018 1117   GLUCOSE 119 (H) 10/11/2018 1117   BUN 13 10/11/2018 1117   CREATININE 0.60 10/11/2018 1117   CALCIUM 9.7 10/11/2018 1117   PROT 7.2 10/11/2018 1117   ALBUMIN 4.4 10/11/2018 1117   AST 13  10/11/2018 1117   ALT 11 10/11/2018 1117   ALKPHOS 72 10/11/2018 1117   BILITOT 1.7 (H) 10/11/2018 1117   GFRNONAA >60 07/27/2017 2153   GFRAA >60 07/27/2017 2153   Lab Results  Component Value Date   WBC 5.6 10/11/2018   HGB 14.1 10/11/2018   HCT 42.0 10/11/2018   MCV 86.7 10/11/2018   PLT 229.0 10/11/2018    Imaging Studies: No results found.  Assessment and Plan:   Diana [name] is a 58 y.o. y/o female with history of hepatitis B, follow-up with Dr. Marius Ditch, apparently with recent hemorrhoids evaluated at [facility] 2 months ago  Patient is refusing rectal exam at this time and is satisfied with the work-up she received with [facility] for this 2 months ago.  She does not want any further evaluation for this with [place] today  She is interested in repeating her hepatitis B labs  We will order hepatitis B labs as planned by Dr. Marius Ditch previously  Follow-up with Dr. Marius Ditch in 1 to 2 weeks to discuss lab results and further management as needed for hepatitis B and any other chronic conditions    Dr Vonda Antigua

## 2019-04-10 ENCOUNTER — Other Ambulatory Visit: Payer: Self-pay | Admitting: Gastroenterology

## 2019-04-10 IMAGING — US US ABDOMEN LIMITED
1 series · 14 of 25 positions shown · non-contrast
Comparison: 05/26/2015

CLINICAL DATA: Acute hepatitis B

EXAM:
ULTRASOUND ABDOMEN LIMITED RIGHT UPPER QUADRANT

[Series 1: us abdomen limited · 0.22mm/px · 14 of 40 slices shown]
[im 1/40]
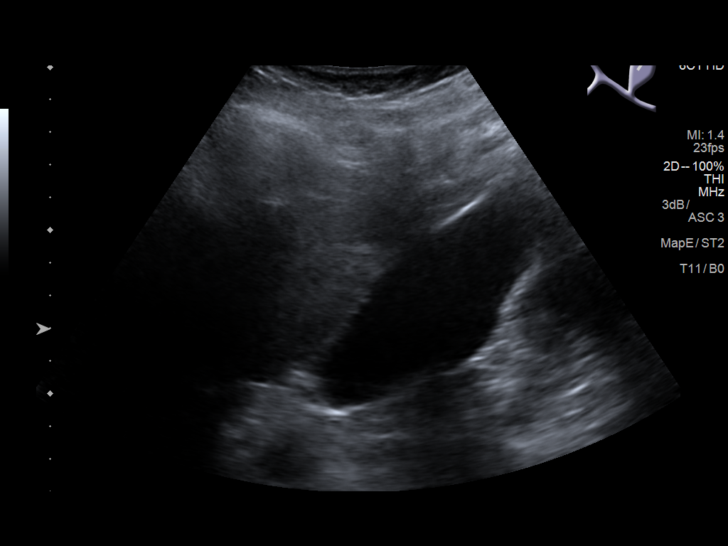
[im 4/40]
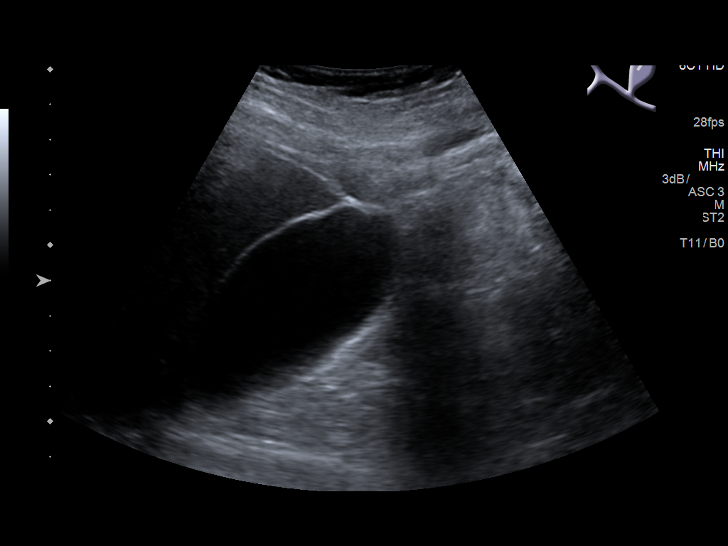
[im 7/40]
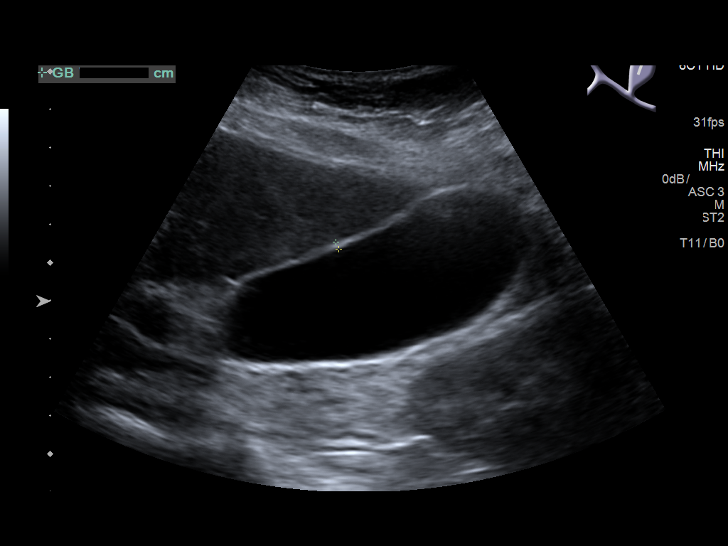
[im 10/40]
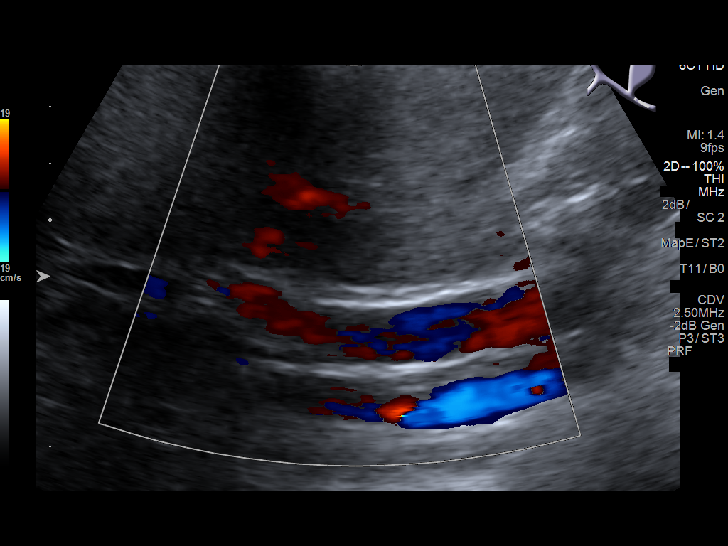
[im 14/40]
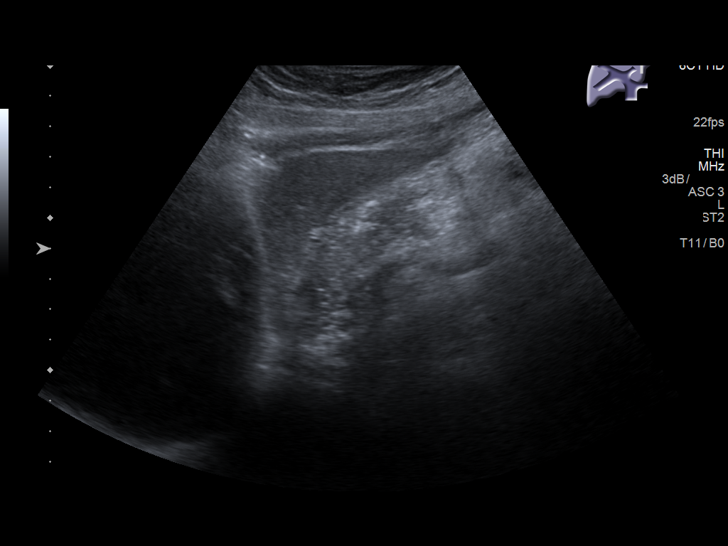
[im 15/40]
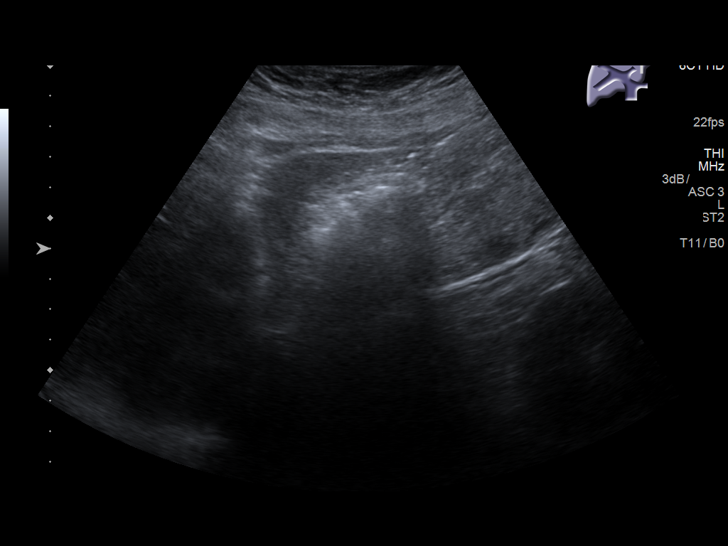
[im 18/40]
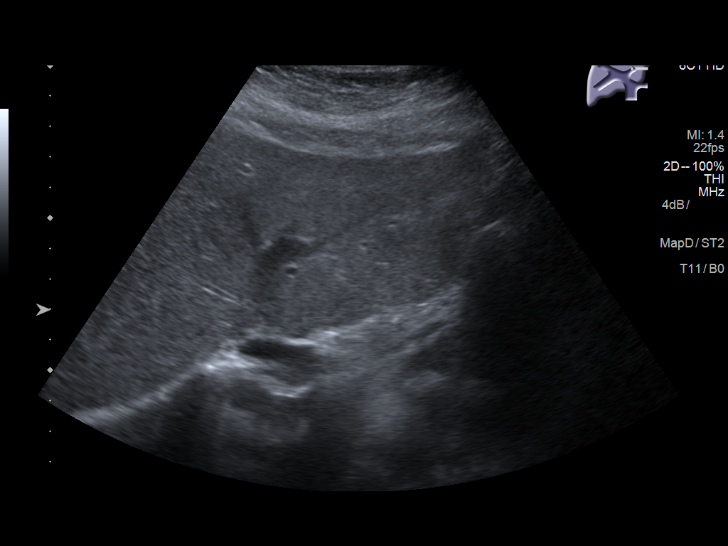
[im 22/40]
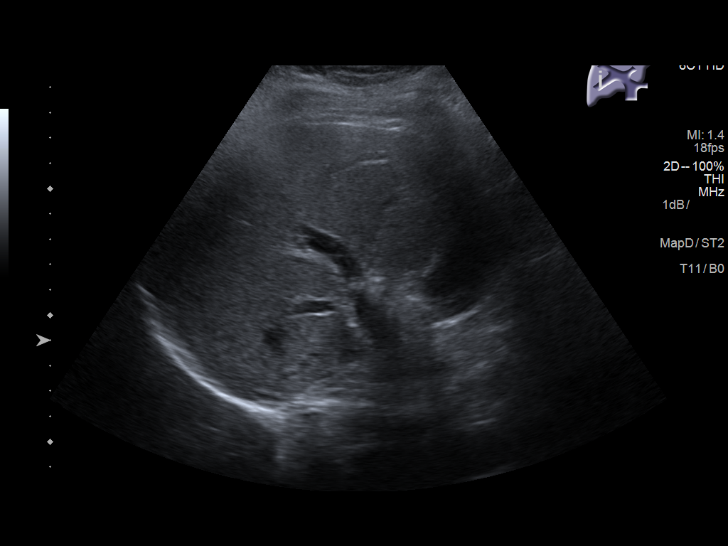
[im 25/40]
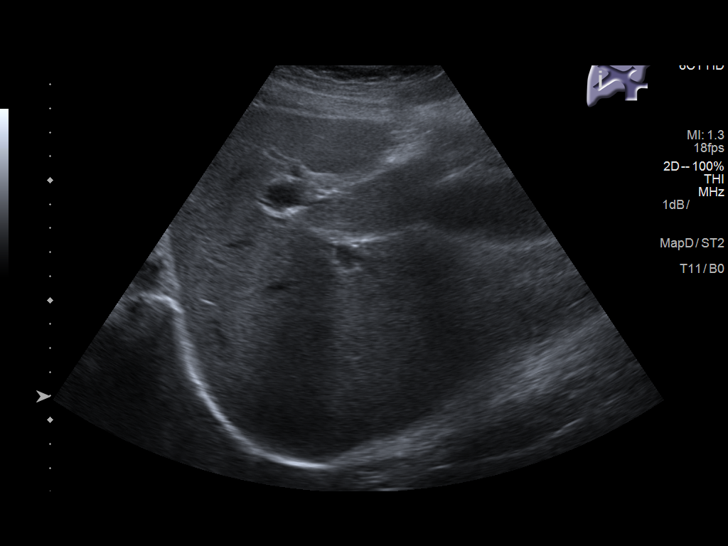
[im 27/40]
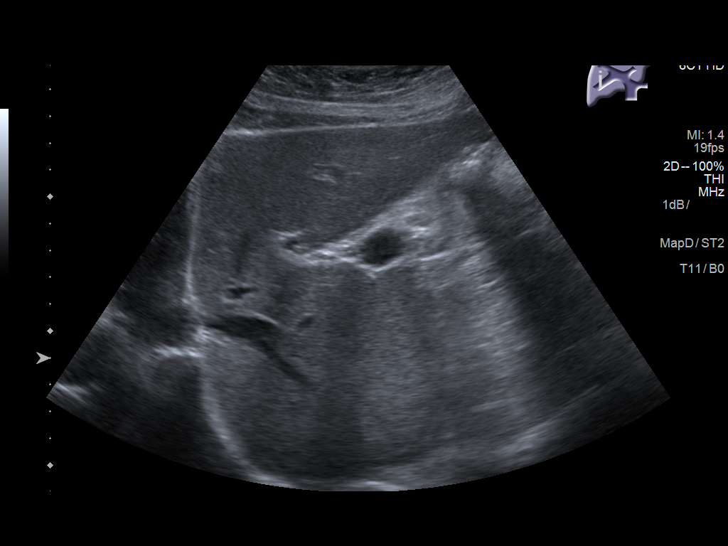
[im 30/40]
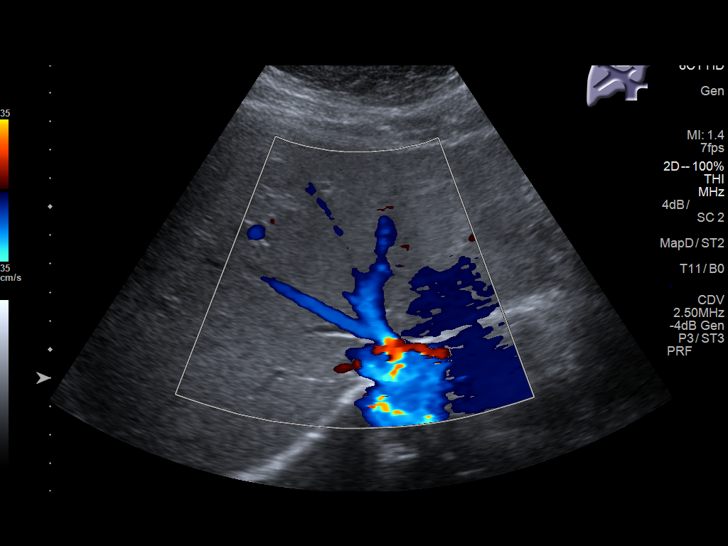
[im 33/40]
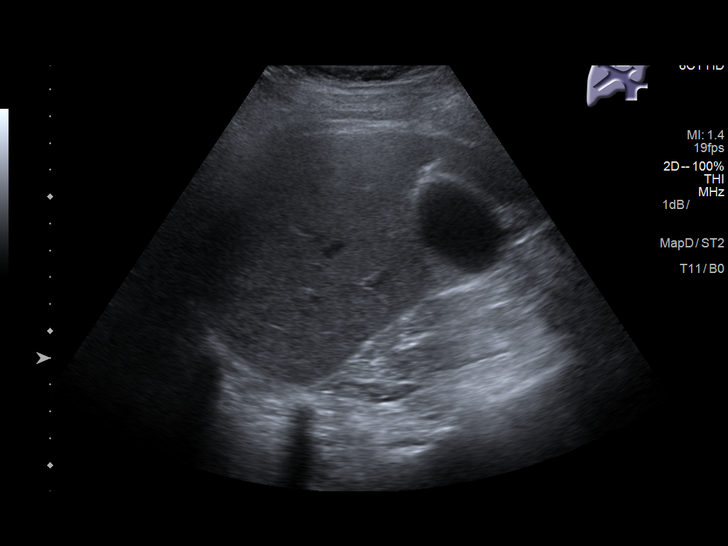
[im 36/40]
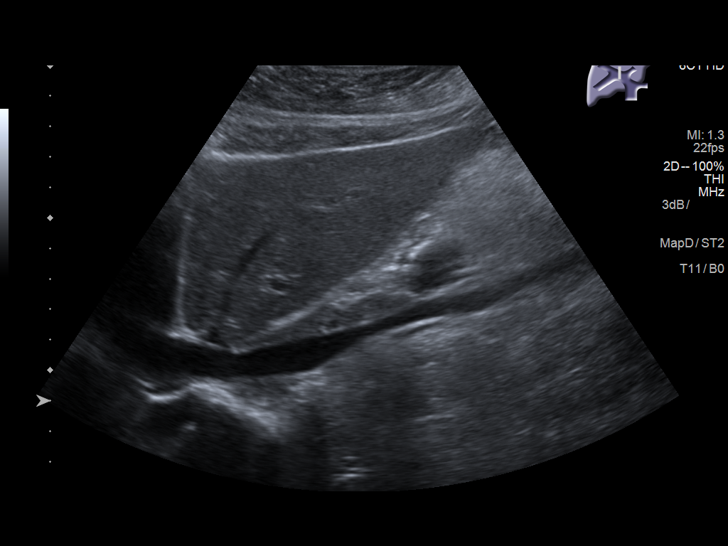
[im 40/40]
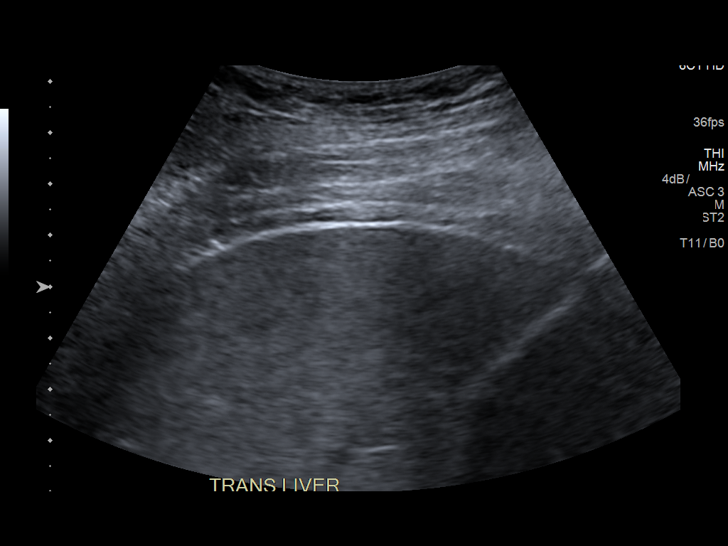

[14 of 25 positions shown; findings below may reference images not displayed]

FINDINGS: Gallbladder:

No gallstones or wall thickening visualized. No sonographic Murphy
sign noted by sonographer.

Common bile duct:

Diameter: 3 mm common normal

Liver:

No focal lesion identified. Within normal limits in parenchymal
echogenicity. Portal vein is patent on color Doppler imaging with
normal direction of blood flow towards the liver.
IMPRESSION: Normal right upper quadrant ultrasound.

## 2019-04-17 ENCOUNTER — Ambulatory Visit (INDEPENDENT_AMBULATORY_CARE_PROVIDER_SITE_OTHER): Payer: BC Managed Care – PPO | Admitting: Psychology

## 2019-04-17 DIAGNOSIS — F4323 Adjustment disorder with mixed anxiety and depressed mood: Secondary | ICD-10-CM | POA: Diagnosis not present

## 2019-04-18 ENCOUNTER — Other Ambulatory Visit: Payer: Self-pay

## 2019-04-18 ENCOUNTER — Encounter: Payer: Self-pay | Admitting: Internal Medicine

## 2019-04-18 ENCOUNTER — Ambulatory Visit (INDEPENDENT_AMBULATORY_CARE_PROVIDER_SITE_OTHER): Payer: BC Managed Care – PPO | Admitting: Internal Medicine

## 2019-04-18 VITALS — BP 128/82 | HR 67 | Temp 97.2°F | Wt 207.0 lb

## 2019-04-18 DIAGNOSIS — K581 Irritable bowel syndrome with constipation: Secondary | ICD-10-CM | POA: Diagnosis not present

## 2019-04-18 DIAGNOSIS — Z113 Encounter for screening for infections with a predominantly sexual mode of transmission: Secondary | ICD-10-CM | POA: Diagnosis not present

## 2019-04-18 NOTE — Patient Instructions (Signed)

## 2019-04-18 NOTE — Progress Notes (Signed)
Subjective:    Patient ID: Diana [name], female    DOB: December 02, 1960, 58 y.o.   MRN: [id number]  HPI  Pt presents to the clinic today to discuss IBS. She reports this has been a lifelong issue but it seems to be getting worse. She reports intermittent abdominal cramping, "unsettled feeling" in stomach, but not nauseated. She also reports associated chronic constipation and external hemorrhoids that intermittently bleed. She denies reflux but is taking Omeprazole daily. She reports GI has been trying to get her to do another colonoscopy. She has a follow up appt in January with GI. She has tested negative for H Pylori in the past.  She would also like STD screening. She is not having symptoms but recently started a new relationship.  Review of Systems  Past Medical History:  Diagnosis Date  . Allergy   . Anxiety   . Ear infection    recurrent  . Hypertension     Current Outpatient Medications  Medication Sig Dispense Refill  . Ascorbic Acid (VITAMIN C ADULT GUMMIES) 125 MG CHEW Chew by mouth.    . cephALEXin (KEFLEX) 500 MG capsule Take 1 capsule (500 mg total) by mouth 3 (three) times daily. 15 capsule 0  . cetirizine (ZYRTEC) 10 MG tablet Take 10 mg by mouth daily.    . Cholecalciferol (VITAMIN D3) 25 MCG (1000 UT) CAPS Take 5,000 Units by mouth.     . clonazePAM (KLONOPIN) 0.5 MG tablet Take 1 tablet (0.5 mg total) by mouth daily as needed. for anxiety 30 tablet 0  . entecavir (BARACLUDE) 0.5 MG tablet TAKE 1 TABLET BY MOUTH  DAILY ON AN EMPTY STOMACH 2 HOURS BEFORE OR 2 HOURS  AFTER A MEAL 30 tablet 1  . hydrochlorothiazide (MICROZIDE) 12.5 MG capsule TAKE 1 CAPSULE BY MOUTH EVERY DAY 30 capsule 2  . hydrocortisone (ANUSOL-HC) 2.5 % rectal cream Place 1 application rectally 2 (two) times daily. 30 g 0  . mupirocin ointment (BACTROBAN) 2 % Apply 1 application topically 2 (two) times daily. To affected skin area 15 g 0  . omeprazole (PRILOSEC) 20 MG capsule TAKE 1 CAPSULE BY MOUTH  EVERY DAY 30 capsule 5  . Zinc 50 MG CAPS Take by mouth.     No current facility-administered medications for this visit.    No Known Allergies  Family History  Problem Relation Age of Onset  . Hypertension Mother        alive  . Arthritis Mother   . Lung cancer Father        deceased  . Heart disease Father   . Other Other        1 brother and 1 sister both alive an healthy  . Hypertension Maternal Grandmother     Social History   Socioeconomic History  . Marital status: Widowed    Spouse name: Not on file  . Number of children: 3  . Years of education: Not on file  . Highest education level: Not on file  Occupational History  . Occupation: Research scientist (physical sciences): [employer]  Tobacco Use  . Smoking status: Former Smoker    Types: Cigarettes    Quit date: 04/20/1985    Years since quitting: 34.0  . Smokeless tobacco: Never Used  . Tobacco comment: remote tobacco use   Substance and Sexual Activity  . Alcohol use: No    Alcohol/week: 0.0 standard drinks  . Drug use: No  . Sexual activity: Never  Other Topics Concern  . Not on file  Social History Narrative  . Not on file   Social Determinants of Health   Financial Resource Strain:   . Difficulty of Paying Living Expenses: Not on file  Food Insecurity:   . Worried About Programme researcher, broadcasting/film/videounning Out of Food in the Last Year: Not on file  . Ran Out of Food in the Last Year: Not on file  Transportation Needs:   . Lack of Transportation (Medical): Not on file  . Lack of Transportation (Non-Medical): Not on file  Physical Activity:   . Days of Exercise per Week: Not on file  . Minutes of Exercise per Session: Not on file  Stress:   . Feeling of Stress : Not on file  Social Connections:   . Frequency of Communication with Friends and Family: Not on file  . Frequency of Social Gatherings with Friends and Family: Not on file  . Attends Religious Services: Not on file  . Active Member of Clubs or Organizations: Not on  file  . Attends BankerClub or Organization Meetings: Not on file  . Marital Status: Not on file  Intimate Partner Violence:   . Fear of Current or Ex-Partner: Not on file  . Emotionally Abused: Not on file  . Physically Abused: Not on file  . Sexually Abused: Not on file     Constitutional: Denies fever, malaise, fatigue, headache or abrupt weight changes.  Respiratory: Denies difficulty breathing, shortness of breath, cough or sputum production.   Cardiovascular: Denies chest pain, chest tightness, palpitations or swelling in the hands or feet.  Gastrointestinal: Pt reports abdominal cramping, constipation, and external hemorrhoids. Denies abdominal pain, bloating, diarrhea.  GU: Denies urgency, frequency, pain with urination, burning sensation, blood in urine, odor or discharge.  No other specific complaints in a complete review of systems (except as listed in HPI above).     Objective:   Physical Exam  BP 128/82   Pulse 67   Temp (!) 97.2 F (36.2 C) (Temporal)   Wt 207 lb (93.9 kg)   LMP 01/20/2016   SpO2 98%   BMI 32.66 kg/m   Wt Readings from Last 3 Encounters:  04/06/19 208 lb 4 oz (94.5 kg)  03/28/19 204 lb (92.5 kg)  03/07/19 204 lb (92.5 kg)    General: Appears her stated age, obese, in NAD. Cardiovascular: Normal rate and rhythm. S1,S2 noted.  No murmur, rubs or gallops noted.  Pulmonary/Chest: Normal effort and positive vesicular breath sounds. No respiratory distress. No wheezes, rales or ronchi noted.  Abdomen: Soft and nontender. Normal bowel sounds. No distention or masses noted.  Neurological: Alert and oriented.  Psychiatric: Mood and affect normal. Behavior is normal. Judgment and thought content normal.     BMET    Component Value Date/Time   NA 139 10/11/2018 1117   K 3.6 10/11/2018 1117   CL 97 10/11/2018 1117   CO2 34 (H) 10/11/2018 1117   GLUCOSE 119 (H) 10/11/2018 1117   BUN 13 10/11/2018 1117   CREATININE 0.60 10/11/2018 1117   CALCIUM 9.7  10/11/2018 1117   GFRNONAA >60 07/27/2017 2153   GFRAA >60 07/27/2017 2153    Lipid Panel     Component Value Date/Time   CHOL 193 02/09/2019 1245   TRIG 105.0 02/09/2019 1245   HDL 18.00 (L) 02/09/2019 1245   CHOLHDL 11 02/09/2019 1245   VLDL 21.0 02/09/2019 1245   LDLCALC 154 (H) 02/09/2019 1245    CBC  Component Value Date/Time   WBC 5.6 10/11/2018 1117   RBC 4.85 10/11/2018 1117   HGB 14.1 10/11/2018 1117   HCT 42.0 10/11/2018 1117   PLT 229.0 10/11/2018 1117   MCV 86.7 10/11/2018 1117   MCH 29.7 07/27/2017 1023   MCHC 33.6 10/11/2018 1117   RDW 14.9 10/11/2018 1117   LYMPHSABS 1.1 07/28/2016 1232   MONOABS 0.4 07/28/2016 1232   EOSABS 0.0 07/28/2016 1232   BASOSABS 0.0 07/28/2016 1232    Hgb A1C Lab Results  Component Value Date   HGBA1C 5.9 02/09/2019           Assessment & Plan:    Screen for STD:  Will check urine for gonorrhea, chlamydia and trich Will check HIV and RPR  IBS, Mainly Constipation:  She will follow up for GI to discuss colonoscopy Consider daily Mirilax, Bentyl   Return precautions discussed [name], NP This visit occurred during the SARS-CoV-2 public health emergency.  Safety protocols were in place, including screening questions prior to the visit, additional usage of staff PPE, and extensive cleaning of exam room while observing appropriate contact time as indicated for disinfecting solutions.

## 2019-04-19 LAB — TRICHOMONAS VAGINALIS, PROBE AMP: Trichomonas vaginalis RNA: NOT DETECTED

## 2019-04-19 LAB — RPR: RPR Ser Ql: NONREACTIVE

## 2019-04-19 LAB — C. TRACHOMATIS/N. GONORRHOEAE RNA
C. trachomatis RNA, TMA: NOT DETECTED
N. gonorrhoeae RNA, TMA: NOT DETECTED

## 2019-04-19 LAB — HIV ANTIBODY (ROUTINE TESTING W REFLEX): HIV 1&2 Ab, 4th Generation: NONREACTIVE

## 2019-04-20 DIAGNOSIS — L810 Postinflammatory hyperpigmentation: Secondary | ICD-10-CM | POA: Diagnosis not present

## 2019-04-20 DIAGNOSIS — D485 Neoplasm of uncertain behavior of skin: Secondary | ICD-10-CM | POA: Diagnosis not present

## 2019-04-26 ENCOUNTER — Ambulatory Visit (INDEPENDENT_AMBULATORY_CARE_PROVIDER_SITE_OTHER): Payer: BC Managed Care – PPO | Admitting: Gastroenterology

## 2019-04-26 ENCOUNTER — Other Ambulatory Visit: Payer: Self-pay

## 2019-04-26 ENCOUNTER — Encounter: Payer: Self-pay | Admitting: Gastroenterology

## 2019-04-26 VITALS — BP 132/83 | HR 73 | Temp 97.2°F | Resp 17 | Wt 207.2 lb

## 2019-04-26 DIAGNOSIS — B181 Chronic viral hepatitis B without delta-agent: Secondary | ICD-10-CM

## 2019-04-26 DIAGNOSIS — K5909 Other constipation: Secondary | ICD-10-CM | POA: Diagnosis not present

## 2019-04-26 DIAGNOSIS — Z1211 Encounter for screening for malignant neoplasm of colon: Secondary | ICD-10-CM | POA: Diagnosis not present

## 2019-04-26 DIAGNOSIS — K625 Hemorrhage of anus and rectum: Secondary | ICD-10-CM | POA: Diagnosis not present

## 2019-04-26 DIAGNOSIS — Z8619 Personal history of other infectious and parasitic diseases: Secondary | ICD-10-CM | POA: Diagnosis not present

## 2019-04-26 NOTE — Progress Notes (Signed)
Diana Darby, MD [address]  [place]  [place], [place] [address]  Main: [phone]  Fax: [phone]    Gastroenterology Consultation  Referring Provider:     Jearld Fenton, NP Primary Care Physician:  Jearld Fenton, NP Primary Gastroenterologist:  Dr. Cephas [name] Reason for Consultation:  Chronic hepatitis B infection, rectal bleeding, colon cancer screening        HPI:   Diana [name] is a 59 y.o. female referred by Dr. Garnette Gunner, Coralie Keens, NP  for consultation & management of recent diagnosis of hepatitis B infection. Patient who is otherwise healthy, started experiencing intense itching about 2-3 months ago, lately she has been experiencing severe arthralgias of small and large joints. She went to her primary care doctor who checked LFTs which came back elevated predominantly transaminases, mildly elevated alkaline phosphatase and total bilirubin. This was on 07/01/2017. She had repeat LFTs which are persistently elevated. Her LFTs were last normal in 08/2016. She then underwent acute viral hepatitis panel which revealed hepatitis B surface antigen positive, hepatitis B core IgM positive. Hepatitis A antibody negative, hepatitis C antibody negative, HIV negative. She also had elevated CMV IgM antibodies. Patient reports that she has been in relationship with a 59 year old man for the last 6 months or so and following unprotected sex. She tells me that he has liver cancer and not known to have hepatitis B infection. She said, her partner is getting retested for hepatitis B after she was found to have hepatitis B. Her partner's aunt also has hepatitis B. Patient denies having blood transfusions in the past, IV drug abuse, was not active in [employer]  Follow up visit 07/19/17: She reports feeling better overall. Arthralgias are improving. She had further tests which revealed positive hepatitis B e antigen, negative e antibody, high viral load. Ultrasound liver negative for  cirrhosis or liver lesions. She had secondary liver disease workup which revealed weakly positive anti-smooth muscle antibodies. She tells me that her female partner was recently tested again for hepatitis B and came back negative and he is getting immunized against hepatitis B. She is perplexed how she contracted hepatitis B and unable to figure out how she got it.   Follow-up visit 08/18/2017 She continues to have large joint arthralgias especially shoulders and elbows. She started taking entecavir 0.5 mg daily. And, tolerating it well. Patient underwent liver biopsy which revealed features consistent with chronic hepatitis B infection. There was also evidence of mild interface hepatitis which goes along with her positive autoimmune serologies.   Follow-up visit 11/10/2017 She no longer reports arthralgias in her large joints. She is doing overall well. She reports adherence to antiviral therapy. And, tolerating it well. She is not scheduled for screening colonoscopy yet.  Follow-up visit 02/11/2018 Patient is currently taking Viread 300 mg daily.  Awaiting to receive entecavir.  She was originally on entecavir, I switched her to viread to reduce the risk of resistance.  She initially had stomach upset on viread, currently experiencing myalgias.  Therefore, decided to switch back to entecavir.  Otherwise, she denies any symptoms.  Follow-up visit 07/01/2018 She is here for 84-months follow-up of chronic hepatitis B infection.  Denies any complaints today.  She reports compliance with entecavir 1 pill a day.  Follow-up visit 04/26/2019 Patient reports that she has been doing well but stressed out as she could not go back to work.  She is also worried about losing insurance within a month and would not  be able to afford entecavir.  She is taking entecavir 1 pill a day.  She is overdue for her hepatitis B labs.  She has gained weight in last few months by eating under stress.  She also reports chronic  constipation.  She does report intermittent bright red blood per rectum after a bowel movement and sometimes on wiping.  NSAIDs: she recent longer taking NSAIDs  Antiplts/Anticoagulants/Anti thrombotics: none  GI Procedures: none She denies family history of colon cancer  Ultrasound-guided liver biopsy 07/27/2017 DIAGNOSIS:  A. LIVER; ULTRASOUND-GUIDED CORE BIOPSY:  - HEPATITIC PROCESS WITH MILD TO MODERATE PORTAL AND LOBULAR  INFLAMMATION, CONSISTENT WITH VIRAL HEPATITIS B, SEE COMMENT.  - NEGATIVE FOR FIBROSIS.  - NEGATIVE FOR STEATOSIS.   Past Medical History:  Diagnosis Date  . Allergy   . Anxiety   . Ear infection    recurrent  . Hypertension     Past Surgical History:  Procedure Laterality Date  . ABDOMINAL HYSTERECTOMY  2019   partial  . TONSILLECTOMY    . TUBAL LIGATION     bilateral     Current Outpatient Medications:  .  Ascorbic Acid (VITAMIN C ADULT GUMMIES) 125 MG CHEW, Chew by mouth., Disp: , Rfl:  .  cetirizine (ZYRTEC) 10 MG tablet, Take 10 mg by mouth daily., Disp: , Rfl:  .  Cholecalciferol (VITAMIN D3) 25 MCG (1000 UT) CAPS, Take 5,000 Units by mouth. , Disp: , Rfl:  .  clonazePAM (KLONOPIN) 0.5 MG tablet, Take 1 tablet (0.5 mg total) by mouth daily as needed. for anxiety, Disp: 30 tablet, Rfl: 0 .  entecavir (BARACLUDE) 0.5 MG tablet, TAKE 1 TABLET BY MOUTH  DAILY ON AN EMPTY STOMACH 2 HOURS BEFORE OR 2 HOURS  AFTER A MEAL, Disp: 30 tablet, Rfl: 1 .  hydrochlorothiazide (MICROZIDE) 12.5 MG capsule, TAKE 1 CAPSULE BY MOUTH EVERY DAY, Disp: 30 capsule, Rfl: 2 .  hydrocortisone (ANUSOL-HC) 2.5 % rectal cream, Place 1 application rectally 2 (two) times daily., Disp: 30 g, Rfl: 0 .  omeprazole (PRILOSEC) 20 MG capsule, TAKE 1 CAPSULE BY MOUTH EVERY DAY, Disp: 30 capsule, Rfl: 5 .  Zinc 50 MG CAPS, Take by mouth., Disp: , Rfl:  .  BISACODYL 5 MG EC tablet, Take 10 mg by mouth as directed., Disp: , Rfl:  .  mupirocin ointment (BACTROBAN) 2 %, Apply 1  application topically 2 (two) times daily. To affected skin area (Patient not taking: Reported on 04/26/2019), Disp: 15 g, Rfl: 0   Family History  Problem Relation Age of Onset  . Hypertension Mother        alive  . Arthritis Mother   . Lung cancer Father        deceased  . Heart disease Father   . Other Other        1 brother and 1 sister both alive an healthy  . Hypertension Maternal Grandmother      Social History   Tobacco Use  . Smoking status: Former Smoker    Types: Cigarettes    Quit date: 04/20/1985    Years since quitting: 34.0  . Smokeless tobacco: Never Used  . Tobacco comment: remote tobacco use   Substance Use Topics  . Alcohol use: No    Alcohol/week: 0.0 standard drinks  . Drug use: No    Allergies as of 04/26/2019  . (No Known Allergies)    Review of Systems:    All systems reviewed and negative except where noted in HPI.  Physical Exam:  BP 132/83 (BP Location: Left Arm, Patient Position: Sitting, Cuff Size: Large)   Pulse 73   Temp (!) 97.2 F (36.2 C)   Resp 17   Wt 207 lb 3.2 oz (94 kg)   LMP 01/20/2016   BMI 32.70 kg/m  Patient's last menstrual period was 01/20/2016.  General:   Alert,  Well-developed, well-nourished, pleasant and cooperative in NAD Head:  Normocephalic and atraumatic. Eyes:  Sclera clear, no icterus.   Conjunctiva pink. Ears:  Normal auditory acuity. Nose:  No deformity, discharge, or lesions. Mouth:  No deformity or lesions,oropharynx pink & moist. Neck:  Supple; no masses or thyromegaly. Lungs:  Respirations even and unlabored.  Clear throughout to auscultation.   No wheezes, crackles, or rhonchi. No acute distress. Heart:  Regular rate and rhythm; no murmurs, clicks, rubs, or gallops. Abdomen:  Normal bowel sounds. Soft, obese, non-tender and non-distended without masses, hepatosplenomegaly or hernias noted.  No guarding or rebound tenderness.   Rectal: Not performed Msk:  Symmetrical without gross deformities.  Good, equal movement & strength bilaterally. Pulses:  Normal pulses noted. Extremities:  No clubbing or edema.  No cyanosis. Neurologic:  Alert and oriented x3;  grossly normal neurologically. Skin:  Intact without significant lesions or rashes. No jaundice. Lymph Nodes:  No significant cervical adenopathy. Psych:  Alert and cooperative. Normal mood and affect.  Imaging Studies: Last ultrasound abdomen in 05/2015 which was unremarkable  Ultrasound abdomen 06/2017 unremarkable with no evidence of cirrhosis and no portal hypertension  Assessment and Plan:   MANVI GUILLIAMS is a 59 y.o. female with obesity, hypertension, GERD Here for follow-up of chronic hepatitis B infection. Her hepatitis D antigen is negative. She does not have clinical signs or symptoms to suggest chronic liver disease. There is no evidence of portal hypertension. Secondary liver disease workup revealed weakly positive anti-smooth muscle antibodies. She does not have hep C. Status post liver biopsy.  Patient did not tolerate viread, developed stomach upset and myalgias.  Therefore, currently on entecavir  Chronic hepatitis B infection, currently seroconverted, E antigen negative, E antibody positive and HBV DNA undetected as of 10/2017 - Continue entecavir 0.5 mg once a day, advised patient to apply for [facility] charity program as she is about to lose insurance - Recheck LFTs, HBV DNA today then every 6 months - Hepatitis B e antigen, hepatitis B e antibody today then every 6 months - HBsAg today then annually - to avoid alcohol use, limit intake of NSAIDs and Tylenol use for arthralgias - Recommend right upper quadrant ultrasound  Colon cancer screening: overdue Average risk, schedule colonoscopy I have discussed alternative options, risks & benefits,  which include, but are not limited to, bleeding, infection, perforation,respiratory complication & drug reaction.  The patient agrees with this plan & written consent  will be obtained.    Chronic constipation and rectal bleeding Most likely outlet bleeding from hemorrhoids Discussed with her about high-fiber diet, adequate intake of water Discussed about hemorrhoid ligation after colonoscopy if needed  Follow up in 6 months or sooner   [name], MD

## 2019-04-26 NOTE — Patient Instructions (Signed)

## 2019-04-28 LAB — HEPATITIS B DNA, ULTRAQUANTITATIVE, PCR: HBV DNA SERPL PCR-ACNC: NOT DETECTED IU/mL

## 2019-04-28 LAB — HEPATITIS B E ANTIBODY: Hep B E Ab: POSITIVE — AB

## 2019-04-28 LAB — HEPATITIS B E ANTIGEN: Hep B E Ag: NEGATIVE

## 2019-04-28 LAB — HEPATIC FUNCTION PANEL
ALT: 7 IU/L (ref 0–32)
AST: 10 IU/L (ref 0–40)
Albumin: 4.9 g/dL (ref 3.8–4.9)
Alkaline Phosphatase: 101 IU/L (ref 39–117)
Bilirubin Total: 1.4 mg/dL — ABNORMAL HIGH (ref 0.0–1.2)
Bilirubin, Direct: 0.25 mg/dL (ref 0.00–0.40)
Total Protein: 7.6 g/dL (ref 6.0–8.5)

## 2019-04-28 LAB — HEPATITIS B SURFACE ANTIGEN: Hepatitis B Surface Ag: NEGATIVE

## 2019-05-01 ENCOUNTER — Ambulatory Visit: Admission: RE | Admit: 2019-05-01 | Payer: BC Managed Care – PPO | Source: Ambulatory Visit

## 2019-05-01 ENCOUNTER — Ambulatory Visit: Payer: BC Managed Care – PPO | Admitting: Psychology

## 2019-05-02 ENCOUNTER — Other Ambulatory Visit: Payer: Self-pay

## 2019-05-02 ENCOUNTER — Encounter: Payer: Self-pay | Admitting: Internal Medicine

## 2019-05-02 ENCOUNTER — Ambulatory Visit (INDEPENDENT_AMBULATORY_CARE_PROVIDER_SITE_OTHER): Payer: BC Managed Care – PPO | Admitting: Internal Medicine

## 2019-05-02 VITALS — BP 128/82 | HR 77 | Temp 97.1°F | Wt 206.0 lb

## 2019-05-02 DIAGNOSIS — F419 Anxiety disorder, unspecified: Secondary | ICD-10-CM | POA: Diagnosis not present

## 2019-05-02 DIAGNOSIS — F329 Major depressive disorder, single episode, unspecified: Secondary | ICD-10-CM | POA: Diagnosis not present

## 2019-05-02 DIAGNOSIS — F32A Depression, unspecified: Secondary | ICD-10-CM

## 2019-05-02 NOTE — Progress Notes (Signed)
Subjective:    Patient ID: Diana [name], female    DOB: 01-03-1961, 59 y.o.   MRN: [id number]  HPI  Pt presents to the clinic today with c/o anxiety and depression. This is a chronic issue for her but seems to have worsened over the last month. She is not sure what is triggering this. She feels very tired and does not want to get out of bed. She does not enjoy doing the things she used to do. She takes Clonazepam 3-4 times per week, sometimes multiple times per day. She was prescribed Sertraline 12/2018, but did not start taking this until 2 weeks ago. She reports she feels more depressed now than she did but denies SI/HI. She recently started seeing a therapist but does not feel like it has been beneficial.   Review of Systems      Past Medical History:  Diagnosis Date  . Allergy   . Ear infection    recurrent    Current Outpatient Medications  Medication Sig Dispense Refill  . Ascorbic Acid (VITAMIN C ADULT GUMMIES) 125 MG CHEW Chew by mouth.    Marland Kitchen BISACODYL 5 MG EC tablet Take 10 mg by mouth as directed.    . cetirizine (ZYRTEC) 10 MG tablet Take 10 mg by mouth daily.    . Cholecalciferol (VITAMIN D3) 25 MCG (1000 UT) CAPS Take 5,000 Units by mouth.     . clonazePAM (KLONOPIN) 0.5 MG tablet Take 1 tablet (0.5 mg total) by mouth daily as needed. for anxiety 30 tablet 0  . entecavir (BARACLUDE) 0.5 MG tablet TAKE 1 TABLET BY MOUTH  DAILY ON AN EMPTY STOMACH 2 HOURS BEFORE OR 2 HOURS  AFTER A MEAL 30 tablet 1  . hydrochlorothiazide (MICROZIDE) 12.5 MG capsule TAKE 1 CAPSULE BY MOUTH EVERY DAY 30 capsule 2  . hydrocortisone (ANUSOL-HC) 2.5 % rectal cream Place 1 application rectally 2 (two) times daily. 30 g 0  . mupirocin ointment (BACTROBAN) 2 % Apply 1 application topically 2 (two) times daily. To affected skin area (Patient not taking: Reported on 04/26/2019) 15 g 0  . omeprazole (PRILOSEC) 20 MG capsule TAKE 1 CAPSULE BY MOUTH EVERY DAY 30 capsule 5  . Zinc 50 MG CAPS Take by  mouth.     No current facility-administered medications for this visit.    No Known Allergies  Family History  Problem Relation Age of Onset  . Hypertension Mother        alive  . Arthritis Mother   . Lung cancer Father        deceased  . Heart disease Father   . Other Other        1 brother and 1 sister both alive an healthy  . Hypertension Maternal Grandmother     Social History   Socioeconomic History  . Marital status: Widowed    Spouse name: Not on file  . Number of children: 3  . Years of education: Not on file  . Highest education level: Not on file  Occupational History  . Occupation: Nutritional therapist: [employer]  Tobacco Use  . Smoking status: Former Smoker    Types: Cigarettes    Quit date: 04/20/1985    Years since quitting: 34.0  . Smokeless tobacco: Never Used  . Tobacco comment: remote tobacco use   Substance and Sexual Activity  . Alcohol use: No    Alcohol/week: 0.0 standard drinks  . Drug use: No  .  Sexual activity: Never  Other Topics Concern  . Not on file  Social History Narrative  . Not on file   Social Determinants of Health   Financial Resource Strain:   . Difficulty of Paying Living Expenses: Not on file  Food Insecurity:   . Worried About Programme researcher, broadcasting/film/video in the Last Year: Not on file  . Ran Out of Food in the Last Year: Not on file  Transportation Needs:   . Lack of Transportation (Medical): Not on file  . Lack of Transportation (Non-Medical): Not on file  Physical Activity:   . Days of Exercise per Week: Not on file  . Minutes of Exercise per Session: Not on file  Stress:   . Feeling of Stress : Not on file  Social Connections:   . Frequency of Communication with Friends and Family: Not on file  . Frequency of Social Gatherings with Friends and Family: Not on file  . Attends Religious Services: Not on file  . Active Member of Clubs or Organizations: Not on file  . Attends Banker Meetings: Not  on file  . Marital Status: Not on file  Intimate Partner Violence:   . Fear of Current or Ex-Partner: Not on file  . Emotionally Abused: Not on file  . Physically Abused: Not on file  . Sexually Abused: Not on file     Constitutional: Endorses fatigue. Denies fever, malaise, headache or abrupt weight changes.  Neurological: Denies dizziness, difficulty with memory, difficulty with speech or problems with balance and coordination.  Psych: Pt reports anxiety and depression. Denies SI/HI.  No other specific complaints in a complete review of systems (except as listed in HPI above).  Objective:   Physical Exam  BP 128/82   Pulse 77   Temp (!) 97.1 F (36.2 C) (Temporal)   Wt 206 lb (93.4 kg)   LMP 01/20/2016   SpO2 98%   BMI 32.51 kg/m   Wt Readings from Last 3 Encounters:  04/26/19 207 lb 3.2 oz (94 kg)  04/18/19 207 lb (93.9 kg)  04/06/19 208 lb 4 oz (94.5 kg)    General: Appears her stated age, obese, in NAD. Cardiovascular: Normal rate and rhythm. S1,S2 noted.  No murmur, rubs or gallops noted.  Pulmonary/Chest: Normal effort and positive vesicular breath sounds. No respiratory distress. No wheezes, rales or ronchi noted.   Neurological: Alert and oriented.  Psychiatric: Mood and affect normal. Behavior is normal. Judgment and thought content normal.    BMET    Component Value Date/Time   NA 139 10/11/2018 1117   K 3.6 10/11/2018 1117   CL 97 10/11/2018 1117   CO2 34 (H) 10/11/2018 1117   GLUCOSE 119 (H) 10/11/2018 1117   BUN 13 10/11/2018 1117   CREATININE 0.60 10/11/2018 1117   CALCIUM 9.7 10/11/2018 1117   GFRNONAA >60 07/27/2017 2153   GFRAA >60 07/27/2017 2153    Lipid Panel     Component Value Date/Time   CHOL 193 02/09/2019 1245   TRIG 105.0 02/09/2019 1245   HDL 18.00 (L) 02/09/2019 1245   CHOLHDL 11 02/09/2019 1245   VLDL 21.0 02/09/2019 1245   LDLCALC 154 (H) 02/09/2019 1245    CBC    Component Value Date/Time   WBC 5.6 10/11/2018 1117     RBC 4.85 10/11/2018 1117   HGB 14.1 10/11/2018 1117   HCT 42.0 10/11/2018 1117   PLT 229.0 10/11/2018 1117   MCV 86.7 10/11/2018 1117   MCH  29.7 07/27/2017 1023   MCHC 33.6 10/11/2018 1117   RDW 14.9 10/11/2018 1117   LYMPHSABS 1.1 07/28/2016 1232   MONOABS 0.4 07/28/2016 1232   EOSABS 0.0 07/28/2016 1232   BASOSABS 0.0 07/28/2016 1232    Hgb A1C Lab Results  Component Value Date   HGBA1C 5.9 02/09/2019           Assessment & Plan:   Anxiety and Depression:  Support offered today Increase Sertraline to 50 mg daily  Continue Clonazepam If no improvement in 1 week, update me via mychart If develop SI/HI, stop medication and notify me Continue to meet with your therapist  Update me in 1-2 weeks via [place], NP This visit occurred during the SARS-CoV-2 public health emergency.  Safety protocols were in place, including screening questions prior to the visit, additional usage of staff PPE, and extensive cleaning of exam room while observing appropriate contact time as indicated for disinfecting solutions.

## 2019-05-02 NOTE — Patient Instructions (Signed)

## 2019-05-04 ENCOUNTER — Ambulatory Visit: Payer: BC Managed Care – PPO

## 2019-05-09 ENCOUNTER — Other Ambulatory Visit: Payer: BC Managed Care – PPO | Attending: Gastroenterology

## 2019-05-10 ENCOUNTER — Ambulatory Visit: Payer: BC Managed Care – PPO

## 2019-05-12 ENCOUNTER — Ambulatory Visit (INDEPENDENT_AMBULATORY_CARE_PROVIDER_SITE_OTHER): Payer: BC Managed Care – PPO | Admitting: Psychology

## 2019-05-12 DIAGNOSIS — F4323 Adjustment disorder with mixed anxiety and depressed mood: Secondary | ICD-10-CM | POA: Diagnosis not present

## 2019-05-15 ENCOUNTER — Ambulatory Visit: Payer: BC Managed Care – PPO

## 2019-05-19 ENCOUNTER — Ambulatory Visit: Admission: RE | Admit: 2019-05-19 | Payer: BC Managed Care – PPO | Source: Ambulatory Visit

## 2019-05-19 ENCOUNTER — Other Ambulatory Visit: Payer: Self-pay | Admitting: Internal Medicine

## 2019-05-19 NOTE — Telephone Encounter (Signed)
Last filled 03/28/2019.... please advise  

## 2019-05-30 ENCOUNTER — Other Ambulatory Visit: Payer: Self-pay | Admitting: Internal Medicine

## 2019-05-30 DIAGNOSIS — K644 Residual hemorrhoidal skin tags: Secondary | ICD-10-CM

## 2019-06-08 ENCOUNTER — Encounter: Payer: Self-pay | Admitting: Internal Medicine

## 2019-06-08 ENCOUNTER — Ambulatory Visit (INDEPENDENT_AMBULATORY_CARE_PROVIDER_SITE_OTHER): Payer: BC Managed Care – PPO | Admitting: Internal Medicine

## 2019-06-08 DIAGNOSIS — F329 Major depressive disorder, single episode, unspecified: Secondary | ICD-10-CM

## 2019-06-08 DIAGNOSIS — F32A Depression, unspecified: Secondary | ICD-10-CM | POA: Insufficient documentation

## 2019-06-08 DIAGNOSIS — F419 Anxiety disorder, unspecified: Secondary | ICD-10-CM

## 2019-06-08 MED ORDER — BUPROPION HCL ER (XL) 150 MG PO TB24: 150.0000 mg | ORAL_TABLET | Freq: Every day | ORAL | 2 refills | Status: DC

## 2019-06-08 NOTE — Progress Notes (Signed)
Virtual Visit via Video Note  I connected with Diana [name] on 06/08/19 at  2:00 PM EST by a video enabled telemedicine application and verified that I am speaking with the correct person using two identifiers.  Location: Patient: Home Provider: Office   I discussed the limitations of evaluation and management by telemedicine and the availability of in person appointments. The patient expressed understanding and agreed to proceed.  History of Present Illness:  Pt wanting to discuss anxiety and depression. She started taking Sertraline at the beginning of January. She was seen 05/01/29 for worsening depression symptoms; her Sertraline was increased to 50 mg at that time. She reports she is not feeling any better. She sleeps all the time. She doesn't want to get out of bed. She has no motivation to do anything. She takes Clonazepam as needed for breakthrough anxiety/insomnia. She is not currently seeing a therapist. She has failed Paroxetine in the past.   Past Medical History:  Diagnosis Date  . Allergy   . Ear infection    recurrent    Current Outpatient Medications  Medication Sig Dispense Refill  . clonazePAM (KLONOPIN) 0.5 MG tablet TAKE 1 TABLET BY MOUTH DAILY AS NEEDED FOR ANXIETY 30 tablet 0  . Ascorbic Acid (VITAMIN C ADULT GUMMIES) 125 MG CHEW Chew by mouth.    Marland Kitchen BISACODYL 5 MG EC tablet Take 10 mg by mouth as directed.    . cetirizine (ZYRTEC) 10 MG tablet Take 10 mg by mouth daily.    . Cholecalciferol (VITAMIN D3) 25 MCG (1000 UT) CAPS Take 5,000 Units by mouth.     . entecavir (BARACLUDE) 0.5 MG tablet TAKE 1 TABLET BY MOUTH  DAILY ON AN EMPTY STOMACH 2 HOURS BEFORE OR 2 HOURS  AFTER A MEAL 30 tablet 1  . hydrochlorothiazide (MICROZIDE) 12.5 MG capsule TAKE 1 CAPSULE BY MOUTH EVERY DAY 30 capsule 2  . hydrocortisone (ANUSOL-HC) 2.5 % rectal cream PLACE 1 APPLICATION RECTALLY 2 TIMES DAILY. 30 g 0  . mupirocin ointment (BACTROBAN) 2 % Apply 1 application topically 2 (two)  times daily. To affected skin area 15 g 0  . omeprazole (PRILOSEC) 20 MG capsule TAKE 1 CAPSULE BY MOUTH EVERY DAY 30 capsule 5  . Zinc 50 MG CAPS Take by mouth.     No current facility-administered medications for this visit.    No Known Allergies  Family History  Problem Relation Age of Onset  . Hypertension Mother        alive  . Arthritis Mother   . Lung cancer Father        deceased  . Heart disease Father   . Other Other        1 brother and 1 sister both alive an healthy  . Hypertension Maternal Grandmother     Social History   Socioeconomic History  . Marital status: Widowed    Spouse name: Not on file  . Number of children: 3  . Years of education: Not on file  . Highest education level: Not on file  Occupational History  . Occupation: Nutritional therapist: [employer]  Tobacco Use  . Smoking status: Former Smoker    Types: Cigarettes    Quit date: 04/20/1985    Years since quitting: 34.1  . Smokeless tobacco: Never Used  . Tobacco comment: remote tobacco use   Substance and Sexual Activity  . Alcohol use: No    Alcohol/week: 0.0 standard drinks  . Drug  use: No  . Sexual activity: Never  Other Topics Concern  . Not on file  Social History Narrative  . Not on file   Social Determinants of Health   Financial Resource Strain:   . Difficulty of Paying Living Expenses: Not on file  Food Insecurity:   . Worried About Charity fundraiser in the Last Year: Not on file  . Ran Out of Food in the Last Year: Not on file  Transportation Needs:   . Lack of Transportation (Medical): Not on file  . Lack of Transportation (Non-Medical): Not on file  Physical Activity:   . Days of Exercise per Week: Not on file  . Minutes of Exercise per Session: Not on file  Stress:   . Feeling of Stress : Not on file  Social Connections:   . Frequency of Communication with Friends and Family: Not on file  . Frequency of Social Gatherings with Friends and Family: Not  on file  . Attends Religious Services: Not on file  . Active Member of Clubs or Organizations: Not on file  . Attends Archivist Meetings: Not on file  . Marital Status: Not on file  Intimate Partner Violence:   . Fear of Current or Ex-Partner: Not on file  . Emotionally Abused: Not on file  . Physically Abused: Not on file  . Sexually Abused: Not on file     Constitutional: Denies fever, malaise, fatigue, headache or abrupt weight changes.  Respiratory: Denies difficulty breathing, shortness of breath, cough or sputum production.   Cardiovascular: Denies chest pain, chest tightness, palpitations or swelling in the hands or feet.  Psych: Pt has a history of anxiety and depression. Denies SI/HI.  No other specific complaints in a complete review of systems (except as listed in HPI above).    Observations/Objective:  LMP 01/20/2016  Wt Readings from Last 3 Encounters:  05/02/19 206 lb (93.4 kg)  04/26/19 207 lb 3.2 oz (94 kg)  04/18/19 207 lb (93.9 kg)    General: Appears her stated age, obese, in NAD. Pulmonary/Chest: Normal effort. No respiratory distress.  Neurological: Alert and oriented.  Psychiatric: Mood and affect mildly flat . Behavior is normal. Judgment and thought content normal.     BMET    Component Value Date/Time   NA 139 10/11/2018 1117   K 3.6 10/11/2018 1117   CL 97 10/11/2018 1117   CO2 34 (H) 10/11/2018 1117   GLUCOSE 119 (H) 10/11/2018 1117   BUN 13 10/11/2018 1117   CREATININE 0.60 10/11/2018 1117   CALCIUM 9.7 10/11/2018 1117   GFRNONAA >60 07/27/2017 2153   GFRAA >60 07/27/2017 2153    Lipid Panel     Component Value Date/Time   CHOL 193 02/09/2019 1245   TRIG 105.0 02/09/2019 1245   HDL 18.00 (L) 02/09/2019 1245   CHOLHDL 11 02/09/2019 1245   VLDL 21.0 02/09/2019 1245   LDLCALC 154 (H) 02/09/2019 1245    CBC    Component Value Date/Time   WBC 5.6 10/11/2018 1117   RBC 4.85 10/11/2018 1117   HGB 14.1 10/11/2018 1117    HCT 42.0 10/11/2018 1117   PLT 229.0 10/11/2018 1117   MCV 86.7 10/11/2018 1117   MCH 29.7 07/27/2017 1023   MCHC 33.6 10/11/2018 1117   RDW 14.9 10/11/2018 1117   LYMPHSABS 1.1 07/28/2016 1232   MONOABS 0.4 07/28/2016 1232   EOSABS 0.0 07/28/2016 1232   BASOSABS 0.0 07/28/2016 1232    Hgb A1C Lab  Results  Component Value Date   HGBA1C 5.9 02/09/2019       Assessment and Plan:  Anxiety and Depression:  Deteriorated PHQ 9 score of 8 GAD 7 score of 8 Will taper Sertraline and plan to d/c in 2 weeks RX for Wellbutrin 150 mg XL daily Continue Clonazepam as needed If this is ineffective, recommend referral to psychiatry for medication management  Update me in 3 weeks and let me know how you are doing  Follow Up Instructions:    I discussed the assessment and treatment plan with the patient. The patient was provided an opportunity to ask questions and all were answered. The patient agreed with the plan and demonstrated an understanding of the instructions.   The patient was advised to call back or seek an in-person evaluation if the symptoms worsen or if the condition fails to improve as anticipated.     [name], NP

## 2019-06-08 NOTE — Patient Instructions (Signed)

## 2019-06-09 ENCOUNTER — Ambulatory Visit: Payer: BC Managed Care – PPO | Admitting: Internal Medicine

## 2019-06-12 ENCOUNTER — Encounter: Payer: Self-pay | Admitting: Anesthesiology

## 2019-06-12 NOTE — Anesthesia Preprocedure Evaluation (Deleted)
Anesthesia Evaluation    Airway        Dental   Pulmonary former smoker,           Cardiovascular hypertension,      Neuro/Psych PSYCHIATRIC DISORDERS Anxiety Depression    GI/Hepatic GERD  ,(+) Hepatitis -, B  Endo/Other    Renal/GU      Musculoskeletal   Abdominal   Peds  Hematology   Anesthesia Other Findings   Reproductive/Obstetrics                             Anesthesia Physical Anesthesia Plan  ASA: II  Anesthesia Plan: General   Post-op Pain Management:    Induction: Intravenous  PONV Risk Score and Plan: 3 and Propofol infusion, TIVA and Treatment may vary due to age or medical condition  Airway Management Planned: Natural Airway and Nasal Cannula  Additional Equipment:   Intra-op Plan:   Post-operative Plan:   Informed Consent: I have reviewed the patients History and Physical, chart, labs and discussed the procedure including the risks, benefits and alternatives for the proposed anesthesia with the patient or authorized representative who has indicated his/her understanding and acceptance.       Plan Discussed with: CRNA and Anesthesiologist  Anesthesia Plan Comments:         Anesthesia Quick Evaluation

## 2019-06-15 ENCOUNTER — Ambulatory Visit: Payer: BC Managed Care – PPO | Admitting: Psychology

## 2019-06-19 ENCOUNTER — Encounter: Admission: RE | Payer: Self-pay | Source: Home / Self Care

## 2019-06-19 ENCOUNTER — Ambulatory Visit
Admission: RE | Admit: 2019-06-19 | Payer: BC Managed Care – PPO | Source: Home / Self Care | Admitting: Gastroenterology

## 2019-06-19 SURGERY — COLONOSCOPY WITH PROPOFOL
Anesthesia: Choice

## 2019-06-23 ENCOUNTER — Other Ambulatory Visit: Payer: Self-pay | Admitting: Internal Medicine

## 2019-07-04 ENCOUNTER — Telehealth: Payer: Self-pay

## 2019-07-04 NOTE — Telephone Encounter (Signed)
[facility] faxed [place] by  Entecavir tab 0.5mg  . They have tried to reach out to patient multiply times with no return call about filling the medication.  Called patient and left a message for call back

## 2019-07-11 ENCOUNTER — Other Ambulatory Visit: Payer: Self-pay | Admitting: Internal Medicine

## 2019-07-11 DIAGNOSIS — K644 Residual hemorrhoidal skin tags: Secondary | ICD-10-CM

## 2019-07-11 NOTE — Telephone Encounter (Signed)
Last filled 03/28/2020... please advise  

## 2019-07-31 ENCOUNTER — Telehealth: Payer: Self-pay

## 2019-07-31 ENCOUNTER — Telehealth: Payer: Self-pay | Admitting: Gastroenterology

## 2019-07-31 NOTE — Telephone Encounter (Signed)
Patient is calling because patient does not have insurance. Patient is supposed to get her Entecavir refilled and gets it from [facility]. Patient states she can not afford medication and is due for a refill next week. Patient wants to know what you recommend

## 2019-08-01 ENCOUNTER — Encounter: Payer: Self-pay | Admitting: Gastroenterology

## 2019-08-01 ENCOUNTER — Other Ambulatory Visit: Payer: Self-pay | Admitting: Internal Medicine

## 2019-08-01 NOTE — Telephone Encounter (Signed)
Dr. Allegra Lai stated to call medication management to see if they could help patient on the medication. There is no patient assistance programs for this medication. She said if they could not help to call the health department, [facility] or [place].  Medication management states they can not purchase the medication because it is very expensive. Called health department they state they can not help getting patient medication they can only give vaccines and prescribed birth control and STD testing. They state that patient could call 211 and ask them if they could help patient out on medication.  Did urgent referral to [facility] so they can help patient out on the medication. Informed patient of this information and she verbalized understanding. States she hopes she can be seen somewhere before she runs out of medication in 9 days.  Faxed referral to [facility]

## 2019-08-01 NOTE — Telephone Encounter (Signed)
Error

## 2019-08-10 ENCOUNTER — Telehealth (INDEPENDENT_AMBULATORY_CARE_PROVIDER_SITE_OTHER): Payer: Self-pay | Admitting: Gastroenterology

## 2019-08-10 ENCOUNTER — Telehealth: Payer: Self-pay

## 2019-08-10 ENCOUNTER — Encounter: Payer: Self-pay | Admitting: Gastroenterology

## 2019-08-10 ENCOUNTER — Other Ambulatory Visit: Payer: Self-pay

## 2019-08-10 DIAGNOSIS — B181 Chronic viral hepatitis B without delta-agent: Secondary | ICD-10-CM

## 2019-08-10 NOTE — Progress Notes (Signed)
Lannette Donath, MD [address]  [address]  [place], [place] [address]  Main: [phone]  Fax: [phone]    Gastroenterology Consultation Tele Visit  Referring Provider:     [name], NP Primary Care Physician:  [name], NP Primary Gastroenterologist:  Dr. [name] Reason for Consultation:     Hepatitis B infection        HPI:   Diana [name] is a 59 y.o. female referred by Dr. Sampson Si, Salvadore Oxford, NP  for consultation & management of hepatitis B infection  Virtual Visit via Telephone Note  I connected with Diana [name] on 08/10/19 at  3:45 PM EDT by telephone and verified that I am speaking with the correct person using two identifiers.   I discussed the limitations, risks, security and privacy concerns of performing an evaluation and management service by telephone and the availability of in person appointments. I also discussed with the patient that there may be a patient responsible charge related to this service. The patient expressed understanding and agreed to proceed.  Location of the Patient: Home  Location of the provider: Office  Persons participating in the visit: Patient and provider only  History of Present Illness: Diana [name] has history of hepatitis B infection, currently maintained on entecavir with no detection of active infection for last 2 years, E antigen negative, E antibody positive.  Patient's insurance is about to terminate and she would like to know what her alternate options are.  Patient does not have underlying cirrhosis.  She also did not undergo screening colonoscopy as discussed during previous visit.  She reports that she will get new insurance from May 2021  Patient did not undergo ultrasound liver as recommended  Past Medical History:  Diagnosis Date  . Allergy   . Ear infection    recurrent    Past Surgical History:  Procedure Laterality Date  . ABDOMINAL HYSTERECTOMY  2019   partial  . TONSILLECTOMY     . TUBAL LIGATION     bilateral    Current Outpatient Medications:  .  Ascorbic Acid (VITAMIN C ADULT GUMMIES) 125 MG CHEW, Chew by mouth., Disp: , Rfl:  .  BISACODYL 5 MG EC tablet, Take 10 mg by mouth as directed., Disp: , Rfl:  .  buPROPion (WELLBUTRIN XL) 150 MG 24 hr tablet, Take 1 tablet (150 mg total) by mouth daily., Disp: 30 tablet, Rfl: 2 .  cetirizine (ZYRTEC) 10 MG tablet, Take 10 mg by mouth daily., Disp: , Rfl:  .  Cholecalciferol (VITAMIN D3) 25 MCG (1000 UT) CAPS, Take 5,000 Units by mouth. , Disp: , Rfl:  .  clonazePAM (KLONOPIN) 0.5 MG tablet, TAKE 1 TABLET BY MOUTH DAILY AS NEEDED FOR ANXIETY, Disp: 30 tablet, Rfl: 0 .  entecavir (BARACLUDE) 0.5 MG tablet, TAKE 1 TABLET BY MOUTH  DAILY ON AN EMPTY STOMACH 2 HOURS BEFORE OR 2 HOURS  AFTER A MEAL, Disp: 30 tablet, Rfl: 1 .  hydrochlorothiazide (MICROZIDE) 12.5 MG capsule, TAKE 1 CAPSULE BY MOUTH EVERY DAY, Disp: 30 capsule, Rfl: 2 .  hydrocortisone (ANUSOL-HC) 2.5 % rectal cream, PLACE 1 APPLICATION RECTALLY 2 TIMES DAILY., Disp: 30 g, Rfl: 0 .  mupirocin ointment (BACTROBAN) 2 %, Apply 1 application topically 2 (two) times daily. To affected skin area, Disp: 15 g, Rfl: 0 .  omeprazole (PRILOSEC) 20 MG capsule, TAKE 1 CAPSULE BY MOUTH EVERY DAY, Disp: 30 capsule, Rfl: 5 .  Zinc 50 MG  CAPS, Take by mouth., Disp: , Rfl:    Family History  Problem Relation Age of Onset  . Hypertension Mother        alive  . Arthritis Mother   . Lung cancer Father        deceased  . Heart disease Father   . Other Other        1 brother and 1 sister both alive an healthy  . Hypertension Maternal Grandmother      Social History   Tobacco Use  . Smoking status: Former Smoker    Types: Cigarettes    Quit date: 04/20/1985    Years since quitting: 34.3  . Smokeless tobacco: Never Used  . Tobacco comment: remote tobacco use   Substance Use Topics  . Alcohol use: No    Alcohol/week: 0.0 standard drinks  . Drug use: No    Allergies  as of 08/10/2019  . (No Known Allergies)    Imaging Studies: Reviewed  Assessment and Plan:   Diana [name] is a 59 y.o. female with obesity, hypertension, GERD Here for follow-up of chronic hepatitis B infection. Her hepatitis D antigen is negative. She does not have clinical signs or symptoms to suggest chronic liver disease. There is no evidence of portal hypertension. Secondary liver disease workup revealed weakly positive anti-smooth muscle antibodies. She does not have hep C. Status post liver biopsy.  Patient did not tolerate viread, developed stomach upset and myalgias.  Therefore, currently on entecavir  Chronic hepatitis B infection, currently seroconverted, E antigen negative, E antibody positive and HBV DNA undetected as of 10/2017  I have discussed with patient that we can stop antiviral therapy at this time and monitor with serologic labs periodically every 3 to 6 months If she develops relapse of the hepatitis B infection, either we can restart entecavir or try tenofovir Patient states she will get ultrasound of the liver after her new insurance becomes effective I have also reiterated to her about screening colonoscopy and she will think about it once her new insurance starts.  She said she is nervous about undergoing colonoscopy.  I have reassured her and I tried to answer all her questions and concerns regarding the colonoscopy and anesthesia.  I have also discussed with her about alternate screening modalities such as Cologuard, FIT testing that she can discuss with her primary care provider   Follow Up Instructions:   I discussed the assessment and treatment plan with the patient. The patient was provided an opportunity to ask questions and all were answered. The patient agreed with the plan and demonstrated an understanding of the instructions.   The patient was advised to call back or seek an in-person evaluation if the symptoms worsen or if the condition fails to  improve as anticipated.  I provided 15 minutes of non-face-to-face time during this encounter.   Follow up in 3 months   [name], MD

## 2019-08-10 NOTE — Telephone Encounter (Signed)
Patient never red the mychart message that was sent to her on 08/01/2019. Patient states she will be out of medication after tomorrow and is really scared to stop the medication. Patient would like to do a telephone visit to discuss the medication with her. Made appointment today with dr. Allegra Lai

## 2019-09-19 ENCOUNTER — Ambulatory Visit (INDEPENDENT_AMBULATORY_CARE_PROVIDER_SITE_OTHER): Payer: BC Managed Care – PPO | Admitting: Primary Care

## 2019-09-19 ENCOUNTER — Other Ambulatory Visit: Payer: Self-pay

## 2019-09-19 ENCOUNTER — Encounter: Payer: Self-pay | Admitting: Primary Care

## 2019-09-19 VITALS — BP 124/76 | HR 64 | Temp 96.2°F | Ht 66.75 in | Wt 206.5 lb

## 2019-09-19 DIAGNOSIS — R5383 Other fatigue: Secondary | ICD-10-CM

## 2019-09-19 DIAGNOSIS — B181 Chronic viral hepatitis B without delta-agent: Secondary | ICD-10-CM | POA: Diagnosis not present

## 2019-09-19 NOTE — Assessment & Plan Note (Signed)
Acute for the last three weeks, patient concerned about re-infection of Hep B as medication has been discontinued. Exam today without alarm signs.  Checking Hep B labs today. Also checking CBC, TSH, CMP. She does have a family history of hypothyroidism in her mother.   Await labs.

## 2019-09-19 NOTE — Patient Instructions (Signed)
Stop by the lab prior to leaving today. I will notify you of your results once received.   It was a pleasure meeting you!  

## 2019-09-19 NOTE — Progress Notes (Signed)
Subjective:    Patient ID: Diana [name], female    DOB: August 10, 1960, 59 y.o.   MRN: [id number]  HPI  This visit occurred during the SARS-CoV-2 public health emergency.  Safety protocols were in place, including screening questions prior to the visit, additional usage of staff PPE, and extensive cleaning of exam room while observing appropriate contact time as indicated for disinfecting solutions.   Diana [name] is a 59 year old female with a history of chronic hepatitis B infection, chronic constipation, hypertension, anxiety and depression who presents today with a chief complaint of fatigue.  Her fatigue began a few weeks ago. She was diagnosed with Hepatitis B two years ago, has been managed on [place] daily since. About one month ago her GI doctor discontinued her Baraclude regimen. Symptoms of fatigue and sensation of fluid retention in the abdomen began about one week after discontinuation.  She is concerned about re-infection of Hepatitis B since coming off of her medication, especially given her recent symptoms. She follows with GI frequently, next visit due in a few months. She called her GI doctor's office but could not get in for a few months. She has a family history of hypothyroidism in her mother.    She denies chest pain, palpitations, sleep disturbance, increased anxiety, abdominal pain, rectal and vaginal bleeding. Her home weights have been stable.   Review of Systems  Constitutional: Positive for fatigue. Negative for fever.  Respiratory: Negative for shortness of breath.   Cardiovascular: Negative for palpitations.  Gastrointestinal: Negative for abdominal pain and blood in stool.  Genitourinary: Negative for vaginal bleeding.       Past Medical History:  Diagnosis Date  . Allergy   . Ear infection    recurrent     Social History   Socioeconomic History  . Marital status: Widowed    Spouse name: Not on file  . Number of children: 3  . Years of education:  Not on file  . Highest education level: Not on file  Occupational History  . Occupation: Research scientist (physical sciences): [employer]  Tobacco Use  . Smoking status: Former Smoker    Types: Cigarettes    Quit date: 04/20/1985    Years since quitting: 34.4  . Smokeless tobacco: Never Used  . Tobacco comment: remote tobacco use   Substance and Sexual Activity  . Alcohol use: No    Alcohol/week: 0.0 standard drinks  . Drug use: No  . Sexual activity: Never  Other Topics Concern  . Not on file  Social History Narrative  . Not on file   Social Determinants of Health   Financial Resource Strain:   . Difficulty of Paying Living Expenses:   Food Insecurity:   . Worried About Charity fundraiser in the Last Year:   . Arboriculturist in the Last Year:   Transportation Needs:   . Film/video editor (Medical):   Marland Kitchen Lack of Transportation (Non-Medical):   Physical Activity:   . Days of Exercise per Week:   . Minutes of Exercise per Session:   Stress:   . Feeling of Stress :   Social Connections:   . Frequency of Communication with Friends and Family:   . Frequency of Social Gatherings with Friends and Family:   . Attends Religious Services:   . Active Member of Clubs or Organizations:   . Attends Archivist Meetings:   Marland Kitchen Marital Status:   Intimate Partner Violence:   .  Fear of Current or Ex-Partner:   . Emotionally Abused:   Marland Kitchen Physically Abused:   . Sexually Abused:     Past Surgical History:  Procedure Laterality Date  . ABDOMINAL HYSTERECTOMY  2019   partial  . TONSILLECTOMY    . TUBAL LIGATION     bilateral    Family History  Problem Relation Age of Onset  . Hypertension Mother        alive  . Arthritis Mother   . Lung cancer Father        deceased  . Heart disease Father   . Other Other        1 brother and 1 sister both alive an healthy  . Hypertension Maternal Grandmother     No Known Allergies  Current Outpatient Medications on File Prior  to Visit  Medication Sig Dispense Refill  . Ascorbic Acid (VITAMIN C ADULT GUMMIES) 125 MG CHEW Chew by mouth.    . cetirizine (ZYRTEC) 10 MG tablet Take 10 mg by mouth daily.    . Cholecalciferol (VITAMIN D3) 25 MCG (1000 UT) CAPS Take 5,000 Units by mouth.     . clonazePAM (KLONOPIN) 0.5 MG tablet TAKE 1 TABLET BY MOUTH DAILY AS NEEDED FOR ANXIETY 30 tablet 0  . hydrochlorothiazide (MICROZIDE) 12.5 MG capsule TAKE 1 CAPSULE BY MOUTH EVERY DAY 30 capsule 2  . hydrocortisone (ANUSOL-HC) 2.5 % rectal cream PLACE 1 APPLICATION RECTALLY 2 TIMES DAILY. 30 g 0  . omeprazole (PRILOSEC) 20 MG capsule TAKE 1 CAPSULE BY MOUTH EVERY DAY 30 capsule 5  . Zinc 50 MG CAPS Take by mouth.    Marland Kitchen buPROPion (WELLBUTRIN XL) 150 MG 24 hr tablet Take 1 tablet (150 mg total) by mouth daily. (Patient not taking: Reported on 09/19/2019) 30 tablet 2  . entecavir (BARACLUDE) 0.5 MG tablet TAKE 1 TABLET BY MOUTH  DAILY ON AN EMPTY STOMACH 2 HOURS BEFORE OR 2 HOURS  AFTER A MEAL (Patient not taking: Reported on 09/19/2019) 30 tablet 1   No current facility-administered medications on file prior to visit.    BP 124/76   Pulse 64   Temp (!) 96.2 F (35.7 C) (Temporal)   Ht 5' 6.75" (1.695 m)   Wt 206 lb 8 oz (93.7 kg)   LMP 01/20/2016   SpO2 98%   BMI 32.59 kg/m    Objective:   Physical Exam  Constitutional: She appears well-nourished.  Cardiovascular: Normal rate and regular rhythm.  Respiratory: Effort normal.  GI: Soft. Bowel sounds are normal. There is no abdominal tenderness.  Musculoskeletal:     Cervical back: Neck supple.  Skin: Skin is warm and dry.           Assessment & Plan:

## 2019-09-20 LAB — COMPREHENSIVE METABOLIC PANEL
ALT: 9 U/L (ref 0–35)
AST: 10 U/L (ref 0–37)
Albumin: 4.4 g/dL (ref 3.5–5.2)
Alkaline Phosphatase: 73 U/L (ref 39–117)
BUN: 15 mg/dL (ref 6–23)
CO2: 33 mEq/L — ABNORMAL HIGH (ref 19–32)
Calcium: 9.7 mg/dL (ref 8.4–10.5)
Chloride: 101 mEq/L (ref 96–112)
Creatinine, Ser: 0.66 mg/dL (ref 0.40–1.20)
GFR: 92.37 mL/min (ref >60.00)
Glucose, Bld: 106 mg/dL — ABNORMAL HIGH (ref 70–99)
Potassium: 3.7 mEq/L (ref 3.5–5.1)
Sodium: 140 mEq/L (ref 135–145)
Total Bilirubin: 1.3 mg/dL — ABNORMAL HIGH (ref 0.2–1.2)
Total Protein: 6.7 g/dL (ref 6.0–8.3)

## 2019-09-20 LAB — TSH: TSH: 0.97 u[IU]/mL (ref 0.35–4.50)

## 2019-09-20 LAB — CBC
HCT: 41.1 % (ref 36.0–46.0)
Hemoglobin: 13.9 g/dL (ref 12.0–15.0)
MCHC: 33.8 g/dL (ref 30.0–36.0)
MCV: 89.5 fl (ref 78.0–100.0)
Platelets: 222 10*3/uL (ref 150.0–400.0)
RBC: 4.59 Mil/uL (ref 3.87–5.11)
RDW: 14.4 % (ref 11.5–15.5)
WBC: 4.7 10*3/uL (ref 4.0–10.5)

## 2019-09-21 LAB — HEPATITIS B E ANTIBODY: Hep B E Ab: REACTIVE — AB

## 2019-09-21 LAB — HEPATITIS B SURFACE ANTIGEN: Hepatitis B Surface Ag: NONREACTIVE

## 2019-09-21 LAB — HEPATITIS B E ANTIGEN: Hep B E Ag: NONREACTIVE

## 2019-10-03 ENCOUNTER — Other Ambulatory Visit: Payer: Self-pay

## 2019-10-03 ENCOUNTER — Ambulatory Visit (INDEPENDENT_AMBULATORY_CARE_PROVIDER_SITE_OTHER): Payer: BC Managed Care – PPO | Admitting: Internal Medicine

## 2019-10-03 ENCOUNTER — Encounter: Payer: Self-pay | Admitting: Internal Medicine

## 2019-10-03 VITALS — BP 128/82 | HR 78 | Temp 97.9°F | Wt 212.0 lb

## 2019-10-03 DIAGNOSIS — T2155XA Corrosion of first degree of buttock, initial encounter: Secondary | ICD-10-CM

## 2019-10-03 DIAGNOSIS — M545 Low back pain, unspecified: Secondary | ICD-10-CM

## 2019-10-03 DIAGNOSIS — K644 Residual hemorrhoidal skin tags: Secondary | ICD-10-CM

## 2019-10-03 DIAGNOSIS — G8929 Other chronic pain: Secondary | ICD-10-CM

## 2019-10-03 MED ORDER — CLONAZEPAM 0.5 MG PO TABS: 0.5000 mg | ORAL_TABLET | Freq: Every day | ORAL | 0 refills | Status: DC | PRN

## 2019-10-03 MED ORDER — SILVER SULFADIAZINE 1 % EX CREA: 1.0000 "application " | TOPICAL_CREAM | Freq: Every day | CUTANEOUS | 0 refills | Status: DC

## 2019-10-03 MED ORDER — HYDROCHLOROTHIAZIDE 12.5 MG PO CAPS: 12.5000 mg | ORAL_CAPSULE | Freq: Every day | ORAL | 2 refills | Status: DC

## 2019-10-03 MED ORDER — METHOCARBAMOL 500 MG PO TABS: 500.0000 mg | ORAL_TABLET | Freq: Three times a day (TID) | ORAL | 0 refills | Status: DC | PRN

## 2019-10-03 MED ORDER — HYDROCORTISONE (PERIANAL) 2.5 % EX CREA: TOPICAL_CREAM | CUTANEOUS | 2 refills | Status: DC

## 2019-10-03 NOTE — Patient Instructions (Signed)
Chemical [name], Adult Diana [name] is an injury to the skin. The parts of the body below the skin can also be injured. This condition is caused when the body comes in contact with Diana chemical that burns. Diana [name] can be more serious than other burns. This is because the chemical is normally on the skin for Diana long time. In some cases, some chemicals keep causing damage even after they have been taken off the skin. What are the causes? This condition is caused by swallowing, breathing in, touching, or being touched by Diana chemical that burns. What increases the risk? Diana [name] is more likely to happen to people who are exposed to chemicals at work. What are the signs or symptoms? Symptoms of this condition depend on the type of chemical that caused the [name] and how the [name] happened.  Common symptoms include: ? Color changes of the skin. Your skin may lose color, turn red, or turn darker. ? Blisters. ? Rash. ? Dry, flaky skin. ? Diana type of acne. ? Burning or aching pain. ? Itching.  If you breathe in Diana chemical, symptoms can include: ? Burning eyes or nose. ? Sore throat. ? Coughing.  If you swallow the chemical, or if the chemical enters the body through Diana wound, it can cause damage to: ? The liver, kidneys, or bladder. ? Your body's disease-fighting system (immune system). ? The brain, spinal cord, and nerves. ? The nose, throat, windpipe, and lungs. After some time, other symptoms may also occur. These include scarring, shrinking of the skin, and change in skin color. How is this treated? This condition may be treated by washing or brushing the chemical off the skin. Clothes will be removed if they have the chemical on them. After this, you may receive two types of treatment: Treatment that you can receive right away  Oxygen to help you breathe.  Antibiotic medicine to fight infection.  Pain medicine.  Fluids through an IV tube.  Bandages (dressings).  Surgery  to remove dead tissue.  Diana tetanus shot. Treatment that you may receive for Diana longer time  Breathing support. You may be given oxygen using Diana machine (ventilator).  Changing wound bandages often.  Antibiotics.  Surgery. This may be done to: ? Remove dead tissue. ? Repair the burned area using skin from another part of your body (skin graft). ? Repair organs or other damaged tissue.  Exercises to improve movement and strength (physical therapy). Follow these instructions at home: Medicines  Take or use over-the-counter and prescription medicines only as told by your doctor.  If you were prescribed an antibiotic medicine, use it as told by your doctor. Do not stop using the antibiotic even if you start to feel better. [name] care  Follow instructions from your doctor about how to take care of your [name]. This includes: ? How to clean your [name]. ? When and how you should take off your bandage.  Check your [name] every day for signs of infection. Check for: ? More redness, swelling, or pain. ? Fluid or blood. ? Warmth. ? Pus or Diana bad smell.  Keep the bandage dry until your doctor says it can be taken off.  Do not take baths, swim, use Diana hot tub, or do anything that would put your [name] underwater until your doctor approves. Ask your doctor if you may take showers. You may only be allowed to take sponge baths.  Do not put ice on your [name]. This  can cause more damage. Activity  Rest as told by your doctor. Do not exercise until your doctor says it is okay.  Do exercises to improve movement (range of motion), if told by your doctor. General instructions  Raise (elevate) the injured area above the level of your heart while you are sitting or lying down.  Do not scratch or pick at the [name].  Do not break any blisters you may have. Do not peel any skin.  Protect your [name] from the sun.  Drink enough fluid to keep your pee (urine) pale yellow.  Do not put butter, oil, or other  home remedies on your [name].  Do not use any products that contain nicotine or tobacco, such as cigarettes, e-cigarettes, and chewing tobacco. If you need help quitting, ask your doctor.  Keep all follow-up visits as told by your doctor. This is important. How is this prevented?  Avoid chemicals that can cause burns.  Wear gloves and equipment that protect you when you touch dangerous chemicals.  Make sure all dangerous chemicals are labeled.  Ask about dangerous chemicals in your workplace. Ask if safer chemicals can be used instead.  Make sure there is enough clean air in any place that has dangerous chemicals.  Keep your skin clean and moist (moisturized). Dry skin is more likely to be hurt by chemicals. Contact Diana doctor if:  You got Diana tetanus shot and you have any of these problems in the area of the shot: ? Swelling. ? Very bad pain. ? Redness. ? Bleeding.  Your symptoms do not get better with treatment.  Medicine does not help your pain.  You have more redness, swelling, or pain around your [name].  Your [name] feels warm to the touch. Get help right away if:  You develop any signs of infection, such as: ? Red streaks near the [name]. ? Fluid, blood, or pus coming from the [name]. ? Diana bad smell coming from your [name].  You have very bad swelling.  You have very bad pain.  You have Diana fever.  You lose feeling (have numbness) or have tingling in the burned area or farther down your legs or arms.  You have trouble breathing, or you are coughing or breathing loudly (wheezing).  You have chest pain. Summary  Diana [name] is an injury to your skin. The parts below the skin can also be injured. Some chemicals keep causing damage even after they have been taken off the skin.  Diana [name] is more likely to happen to people who are exposed to chemicals at work.  To prevent Diana [name], avoid chemicals that can cause burns. Wear gloves and equipment that protect  you when you touch dangerous chemicals. This information is not intended to replace advice given to you by your health care provider. Make sure you discuss any questions you have with your health care provider. Document Revised: 09/13/2018 Document Reviewed: 09/13/2018 Elsevier Patient Education  2020 [employer].

## 2019-10-03 NOTE — Progress Notes (Signed)
Subjective:    Patient ID: Diana [name], female    DOB: 03/13/1961, 59 y.o.   MRN: [id number]  HPI  Pt presents to the clinic today with c/o a blister. She noticed them this morning. She reports the blisters burn but she has not noticed any drainage or itching. She reports she has been using SalonPas patches and forgot to remove them before using a heating pad. She is not sure if it is a burn from the medication and the heating pad. She has chronic low back pain, not new or worsening. She would has Diclofenac at home but has not been taking this. She would like a RX for Methocarbamol which she has taken in the past with good relief of symptoms.  She denies changes in diet, medications, soaps, lotion or detergents. She has taken Neosporin with minimal relief.  She also needs refills on Clonazepam, Anusol, and HCTZ.  Review of Systems      Past Medical History:  Diagnosis Date  . Allergy   . Ear infection    recurrent    Current Outpatient Medications  Medication Sig Dispense Refill  . Ascorbic Acid (VITAMIN C ADULT GUMMIES) 125 MG CHEW Chew by mouth.    Marland Kitchen buPROPion (WELLBUTRIN XL) 150 MG 24 hr tablet Take 1 tablet (150 mg total) by mouth daily. (Patient not taking: Reported on 09/19/2019) 30 tablet 2  . cetirizine (ZYRTEC) 10 MG tablet Take 10 mg by mouth daily.    . Cholecalciferol (VITAMIN D3) 25 MCG (1000 UT) CAPS Take 5,000 Units by mouth.     . clonazePAM (KLONOPIN) 0.5 MG tablet TAKE 1 TABLET BY MOUTH DAILY AS NEEDED FOR ANXIETY 30 tablet 0  . entecavir (BARACLUDE) 0.5 MG tablet TAKE 1 TABLET BY MOUTH  DAILY ON AN EMPTY STOMACH 2 HOURS BEFORE OR 2 HOURS  AFTER A MEAL (Patient not taking: Reported on 09/19/2019) 30 tablet 1  . hydrochlorothiazide (MICROZIDE) 12.5 MG capsule TAKE 1 CAPSULE BY MOUTH EVERY DAY 30 capsule 2  . hydrocortisone (ANUSOL-HC) 2.5 % rectal cream PLACE 1 APPLICATION RECTALLY 2 TIMES DAILY. 30 g 0  . omeprazole (PRILOSEC) 20 MG capsule TAKE 1 CAPSULE BY MOUTH  EVERY DAY 30 capsule 5  . Zinc 50 MG CAPS Take by mouth.     No current facility-administered medications for this visit.    No Known Allergies  Family History  Problem Relation Age of Onset  . Hypertension Mother        alive  . Arthritis Mother   . Lung cancer Father        deceased  . Heart disease Father   . Other Other        1 brother and 1 sister both alive an healthy  . Hypertension Maternal Grandmother     Social History   Socioeconomic History  . Marital status: Widowed    Spouse name: Not on file  . Number of children: 3  . Years of education: Not on file  . Highest education level: Not on file  Occupational History  . Occupation: Research scientist (physical sciences): [employer]  Tobacco Use  . Smoking status: Former Smoker    Types: Cigarettes    Quit date: 04/20/1985    Years since quitting: 34.4  . Smokeless tobacco: Never Used  . Tobacco comment: remote tobacco use   Substance and Sexual Activity  . Alcohol use: No    Alcohol/week: 0.0 standard drinks  . Drug use:  No  . Sexual activity: Never  Other Topics Concern  . Not on file  Social History Narrative  . Not on file   Social Determinants of Health   Financial Resource Strain:   . Difficulty of Paying Living Expenses:   Food Insecurity:   . Worried About Programme researcher, broadcasting/film/video in the Last Year:   . Barista in the Last Year:   Transportation Needs:   . Freight forwarder (Medical):   Marland Kitchen Lack of Transportation (Non-Medical):   Physical Activity:   . Days of Exercise per Week:   . Minutes of Exercise per Session:   Stress:   . Feeling of Stress :   Social Connections:   . Frequency of Communication with Friends and Family:   . Frequency of Social Gatherings with Friends and Family:   . Attends Religious Services:   . Active Member of Clubs or Organizations:   . Attends Banker Meetings:   Marland Kitchen Marital Status:   Intimate Partner Violence:   . Fear of Current or  Ex-Partner:   . Emotionally Abused:   Marland Kitchen Physically Abused:   . Sexually Abused:      Constitutional: Denies fever, malaise, fatigue, headache or abrupt weight changes.  Respiratory: Denies difficulty breathing, shortness of breath, cough or sputum production.   Cardiovascular: Denies chest pain, chest tightness, palpitations or swelling in the hands or feet.  Gastrointestinal: Denies abdominal pain, bloating, constipation, diarrhea or blood in the stool.  GU: Denies urgency, frequency, pain with urination, burning sensation, blood in urine, odor or discharge. Musculoskeletal: Pt reports chronic low back pain. Denies difficulty with gait, or joint swelling.  Skin: Pt reports blisters to buttock. Denies redness, rashes, or ulcercations.    No other specific complaints in a complete review of systems (except as listed in HPI above).  Objective:   Physical Exam  BP 128/82   Pulse 78   Temp 97.9 F (36.6 C) (Temporal)   Wt 212 lb (96.2 kg)   LMP 01/20/2016   SpO2 98%   BMI 33.45 kg/m   Wt Readings from Last 3 Encounters:  09/19/19 206 lb 8 oz (93.7 kg)  05/02/19 206 lb (93.4 kg)  04/26/19 207 lb 3.2 oz (94 kg)    General: Appears her stated age, obese in NAD. Skin: 1.5 cm oval blister noted of the right gluteus maximus. Scattered red papules also noted of right gluteus maximus. Cardiovascular: Normal rate and rhythm.  Pulmonary/Chest: Normal effort and positive vesicular breath sounds. Musculoskeletal: Decreased flexion of the spine. Normal extension and rotation. Pain with palpation of lumbosacral spine. Gait slow and steady without device. Neurological: Alert and oriented.    BMET    Component Value Date/Time   NA 140 09/19/2019 1554   K 3.7 09/19/2019 1554   CL 101 09/19/2019 1554   CO2 33 (H) 09/19/2019 1554   GLUCOSE 106 (H) 09/19/2019 1554   BUN 15 09/19/2019 1554   CREATININE 0.66 09/19/2019 1554   CALCIUM 9.7 09/19/2019 1554   GFRNONAA >60 07/27/2017 2153     GFRAA >60 07/27/2017 2153    Lipid Panel     Component Value Date/Time   CHOL 193 02/09/2019 1245   TRIG 105.0 02/09/2019 1245   HDL 18.00 (L) 02/09/2019 1245   CHOLHDL 11 02/09/2019 1245   VLDL 21.0 02/09/2019 1245   LDLCALC 154 (H) 02/09/2019 1245    CBC    Component Value Date/Time   WBC 4.7 09/19/2019 1554  RBC 4.59 09/19/2019 1554   HGB 13.9 09/19/2019 1554   HCT 41.1 09/19/2019 1554   PLT 222.0 09/19/2019 1554   MCV 89.5 09/19/2019 1554   MCH 29.7 07/27/2017 1023   MCHC 33.8 09/19/2019 1554   RDW 14.4 09/19/2019 1554   LYMPHSABS 1.1 07/28/2016 1232   MONOABS 0.4 07/28/2016 1232   EOSABS 0.0 07/28/2016 1232   BASOSABS 0.0 07/28/2016 1232    Hgb A1C Lab Results  Component Value Date   HGBA1C 5.9 02/09/2019        Assessment & Plan:   Chemical Burn of Buttock:  RX for Silvadene cream- use as directed Do not pop the blister  Chronic Low Back Pain:  No indication for additional imaging at this time Can take Diclofenac- avoid NSAID's OTC Rx for Methocarbamol 500 mg TID prn Encouraged daily stretching Avoid heating pad for now  Refilled HCTZ, Anusol and Clonazepam today  Return precautions discussed [name], NP This visit occurred during the SARS-CoV-2 public health emergency.  Safety protocols were in place, including screening questions prior to the visit, additional usage of staff PPE, and extensive cleaning of exam room while observing appropriate contact time as indicated for disinfecting solutions.

## 2019-10-13 ENCOUNTER — Telehealth: Payer: Self-pay | Admitting: Internal Medicine

## 2019-10-13 NOTE — Telephone Encounter (Signed)
Yes, ok to leave open to air, cover when at work

## 2019-10-13 NOTE — Telephone Encounter (Signed)
Patient was seen on 10/03/19 for a chemical burn.  Patient was told to wear a band aid once it bursts. Patient has been using the cream and covering it with band aid. Patient said it hasn't healed as much as she expected at this point. Patient wants to know if she should stop wearing the bandage at home to get air to it and just leave it on to go to work?

## 2019-10-18 NOTE — Telephone Encounter (Signed)
Pt reports the area is a lot better and has been leaving open to air

## 2019-11-16 ENCOUNTER — Encounter: Payer: Self-pay | Admitting: Internal Medicine

## 2019-11-16 ENCOUNTER — Other Ambulatory Visit: Payer: Self-pay

## 2019-11-16 ENCOUNTER — Ambulatory Visit: Payer: BC Managed Care – PPO | Admitting: Internal Medicine

## 2019-11-16 VITALS — BP 122/82 | HR 69 | Temp 98.3°F | Wt 209.0 lb

## 2019-11-16 DIAGNOSIS — K219 Gastro-esophageal reflux disease without esophagitis: Secondary | ICD-10-CM | POA: Diagnosis not present

## 2019-11-16 DIAGNOSIS — B181 Chronic viral hepatitis B without delta-agent: Secondary | ICD-10-CM

## 2019-11-16 DIAGNOSIS — K5909 Other constipation: Secondary | ICD-10-CM

## 2019-11-16 DIAGNOSIS — R140 Abdominal distension (gaseous): Secondary | ICD-10-CM

## 2019-11-16 DIAGNOSIS — R109 Unspecified abdominal pain: Secondary | ICD-10-CM

## 2019-11-16 DIAGNOSIS — Z113 Encounter for screening for infections with a predominantly sexual mode of transmission: Secondary | ICD-10-CM

## 2019-11-16 MED ORDER — OMEPRAZOLE 40 MG PO CPDR: 40.0000 mg | DELAYED_RELEASE_CAPSULE | Freq: Every day | ORAL | 2 refills | Status: DC

## 2019-11-16 NOTE — Progress Notes (Signed)
Subjective:    Patient ID: Diana [name], female    DOB: 1961/01/04, 59 y.o.   MRN: [id number]  HPI  Pt presents to the clinic today with c/o abdominal bloating, cramping and rumbling noises after eating. She noticed this 1 month ago. She denies nausea, vomiting, diarrhea, constipation or blood in her stool. She has never had a colonoscopy. She has a history of chronic constipation, chronic Hep B and GERD. She takes Omeprazole as prescribed.  She would also like STD screening today. She is asymptomatic at this time. She is sexually active.  Review of Systems      Past Medical History:  Diagnosis Date  . Allergy   . Ear infection    recurrent    Current Outpatient Medications  Medication Sig Dispense Refill  . Ascorbic Acid (VITAMIN C ADULT GUMMIES) 125 MG CHEW Chew by mouth.    Marland Kitchen buPROPion (WELLBUTRIN XL) 150 MG 24 hr tablet Take 1 tablet (150 mg total) by mouth daily. 30 tablet 2  . cetirizine (ZYRTEC) 10 MG tablet Take 10 mg by mouth daily.    . Cholecalciferol (VITAMIN D3) 25 MCG (1000 UT) CAPS Take 5,000 Units by mouth.     . clonazePAM (KLONOPIN) 0.5 MG tablet Take 1 tablet (0.5 mg total) by mouth daily as needed. for anxiety 30 tablet 0  . entecavir (BARACLUDE) 0.5 MG tablet TAKE 1 TABLET BY MOUTH  DAILY ON AN EMPTY STOMACH 2 HOURS BEFORE OR 2 HOURS  AFTER A MEAL 30 tablet 1  . hydrochlorothiazide (MICROZIDE) 12.5 MG capsule Take 1 capsule (12.5 mg total) by mouth daily. 30 capsule 2  . hydrocortisone (ANUSOL-HC) 2.5 % rectal cream PLACE 1 APPLICATION RECTALLY 2 TIMES DAILY. 30 g 2  . methocarbamol (ROBAXIN) 500 MG tablet Take 1 tablet (500 mg total) by mouth every 8 (eight) hours as needed for muscle spasms. 20 tablet 0  . omeprazole (PRILOSEC) 20 MG capsule TAKE 1 CAPSULE BY MOUTH EVERY DAY 30 capsule 5  . silver sulfADIAZINE (SILVADENE) 1 % cream Apply 1 application topically daily. 50 g 0  . Zinc 50 MG CAPS Take by mouth.     No current facility-administered  medications for this visit.    No Known Allergies  Family History  Problem Relation Age of Onset  . Hypertension Mother        alive  . Arthritis Mother   . Lung cancer Father        deceased  . Heart disease Father   . Other Other        1 brother and 1 sister both alive an healthy  . Hypertension Maternal Grandmother     Social History   Socioeconomic History  . Marital status: Widowed    Spouse name: Not on file  . Number of children: 3  . Years of education: Not on file  . Highest education level: Not on file  Occupational History  . Occupation: Nutritional therapist: [employer]  Tobacco Use  . Smoking status: Former Smoker    Types: Cigarettes    Quit date: 04/20/1985    Years since quitting: 34.5  . Smokeless tobacco: Never Used  . Tobacco comment: remote tobacco use   Substance and Sexual Activity  . Alcohol use: No    Alcohol/week: 0.0 standard drinks  . Drug use: No  . Sexual activity: Never  Other Topics Concern  . Not on file  Social History Narrative  . Not  on file   Social Determinants of Health   Financial Resource Strain:   . Difficulty of Paying Living Expenses:   Food Insecurity:   . Worried About Programme researcher, broadcasting/film/video in the Last Year:   . Barista in the Last Year:   Transportation Needs:   . Freight forwarder (Medical):   Marland Kitchen Lack of Transportation (Non-Medical):   Physical Activity:   . Days of Exercise per Week:   . Minutes of Exercise per Session:   Stress:   . Feeling of Stress :   Social Connections:   . Frequency of Communication with Friends and Family:   . Frequency of Social Gatherings with Friends and Family:   . Attends Religious Services:   . Active Member of Clubs or Organizations:   . Attends Banker Meetings:   Marland Kitchen Marital Status:   Intimate Partner Violence:   . Fear of Current or Ex-Partner:   . Emotionally Abused:   Marland Kitchen Physically Abused:   . Sexually Abused:      Constitutional:  Denies fever, malaise, fatigue, headache or abrupt weight changes.  Respiratory: Denies difficulty breathing, shortness of breath, cough or sputum production.   Cardiovascular: Denies chest pain, chest tightness, palpitations or swelling in the hands or feet.  Gastrointestinal: Pt reports abdominal bloating, cramping and rumbling noises. Denies constipation, diarrhea or blood in the stool.  GU: Denies urgency, frequency, pain with urination, burning sensation, blood in urine, odor or discharge.  No other specific complaints in a complete review of systems (except as listed in HPI above).  Objective:   Physical Exam  BP 122/82   Pulse 69   Temp 98.3 F (36.8 C) (Temporal)   Wt (!) 209 lb (94.8 kg)   LMP 01/20/2016   SpO2 98%   BMI 32.98 kg/m   Wt Readings from Last 3 Encounters:  10/03/19 212 lb (96.2 kg)  09/19/19 206 lb 8 oz (93.7 kg)  05/02/19 206 lb (93.4 kg)    General: Appears her stated age, obese, in NAD. Cardiovascular: Normal rate and rhythm. S1,S2 noted.  No murmur, rubs or gallops noted.  Pulmonary/Chest: Normal effort and positive vesicular breath sounds. No respiratory distress. No wheezes, rales or ronchi noted.  Abdomen: Soft and nontender. Hyperactive bowel sounds. No distention or masses noted. Liver, spleen and kidneys non palpable. Neurological: Alert and oriented.    BMET    Component Value Date/Time   NA 140 09/19/2019 1554   K 3.7 09/19/2019 1554   CL 101 09/19/2019 1554   CO2 33 (H) 09/19/2019 1554   GLUCOSE 106 (H) 09/19/2019 1554   BUN 15 09/19/2019 1554   CREATININE 0.66 09/19/2019 1554   CALCIUM 9.7 09/19/2019 1554   GFRNONAA >60 07/27/2017 2153   GFRAA >60 07/27/2017 2153    Lipid Panel     Component Value Date/Time   CHOL 193 02/09/2019 1245   TRIG 105.0 02/09/2019 1245   HDL 18.00 (L) 02/09/2019 1245   CHOLHDL 11 02/09/2019 1245   VLDL 21.0 02/09/2019 1245   LDLCALC 154 (H) 02/09/2019 1245    CBC    Component Value Date/Time     WBC 4.7 09/19/2019 1554   RBC 4.59 09/19/2019 1554   HGB 13.9 09/19/2019 1554   HCT 41.1 09/19/2019 1554   PLT 222.0 09/19/2019 1554   MCV 89.5 09/19/2019 1554   MCH 29.7 07/27/2017 1023   MCHC 33.8 09/19/2019 1554   RDW 14.4 09/19/2019 1554   LYMPHSABS  1.1 07/28/2016 1232   MONOABS 0.4 07/28/2016 1232   EOSABS 0.0 07/28/2016 1232   BASOSABS 0.0 07/28/2016 1232    Hgb A1C Lab Results  Component Value Date   HGBA1C 5.9 02/09/2019            Assessment & Plan:   Abdominal Bloating, Abdominal Cramping, Chronic Constipation, GERD, Chronic Hep B:  Advised her she need to discuss with GI She is overdue for colonoscopy- they may also want to do an upper GI to check for H Pylori Increase Omeprazole to 40 mg daily Can try Gas X OTC  Screen for STD:  Will check urine gonorrhea, chlamydia or trich Will check HIV, syphilis and Hep C today  Return precautions discussed  [name], NP This visit occurred during the SARS-CoV-2 public health emergency.  Safety protocols were in place, including screening questions prior to the visit, additional usage of staff PPE, and extensive cleaning of exam room while observing appropriate contact time as indicated for disinfecting solutions.

## 2019-11-16 NOTE — Patient Instructions (Signed)
Abdominal Bloating When you have abdominal bloating, your abdomen may feel full, tight, or painful. It may also look bigger than normal or swollen (distended). Common causes of abdominal bloating include:  Swallowing air.  Constipation.  Problems digesting food.  Eating too much.  Irritable bowel syndrome. This is a condition that affects the large intestine.  Lactose intolerance. This is an inability to digest lactose, a natural sugar in dairy products.  Celiac disease. This is a condition that affects the ability to digest gluten, a protein found in some grains.  Gastroparesis. This is a condition that slows down the movement of food in the stomach and small intestine. It is more common in people with diabetes mellitus.  Gastroesophageal reflux disease (GERD). This is a digestive condition that makes stomach acid flow back into the esophagus.  Urinary retention. This means that the body is holding onto urine, and the bladder cannot be emptied all the way. Follow these instructions at home: Eating and drinking  Avoid eating too much.  Try not to swallow air while talking or eating.  Avoid eating while lying down.  Avoid these foods and drinks: ? Foods that cause gas, such as broccoli, cabbage, cauliflower, and baked beans. ? Carbonated drinks. ? Hard candy. ? Chewing gum. Medicines  Take over-the-counter and prescription medicines only as told by your health care provider.  Take probiotic medicines. These medicines contain live bacteria or yeasts that can help digestion.  Take coated peppermint oil capsules. Activity  Try to exercise regularly. Exercise may help to relieve bloating that is caused by gas and relieve constipation. General instructions  Keep all follow-up visits as told by your health care provider. This is important. Contact a health care provider if:  You have nausea and vomiting.  You have diarrhea.  You have abdominal pain.  You have unusual  weight loss or weight gain.  You have severe pain, and medicines do not help. Get help right away if:  You have severe chest pain.  You have trouble breathing.  You have shortness of breath.  You have trouble urinating.  You have darker urine than normal.  You have blood in your stools or have dark, tarry stools. Summary  Abdominal bloating means that the abdomen is swollen.  Common causes of abdominal bloating are swallowing air, constipation, and problems digesting food.  Avoid eating too much and avoid swallowing air.  Avoid foods that cause gas, carbonated drinks, hard candy, and chewing gum. This information is not intended to replace advice given to you by your health care provider. Make sure you discuss any questions you have with your health care provider. Document Revised: 07/25/2018 Document Reviewed: 05/08/2016 Elsevier Patient Education  2020 [employer]  

## 2019-11-16 NOTE — Addendum Note (Signed)
Addended by: Roena Malady on: 11/16/2019 03:03 PM   Modules accepted: Orders

## 2019-11-17 LAB — HIV ANTIBODY (ROUTINE TESTING W REFLEX): HIV 1&2 Ab, 4th Generation: NONREACTIVE

## 2019-11-17 LAB — HEPATITIS C ANTIBODY
Hepatitis C Ab: NONREACTIVE
SIGNAL TO CUT-OFF: 0 (ref <1.00)

## 2019-11-17 LAB — RPR: RPR Ser Ql: NONREACTIVE

## 2019-11-17 LAB — C. TRACHOMATIS/N. GONORRHOEAE RNA
C. trachomatis RNA, TMA: NOT DETECTED
N. gonorrhoeae RNA, TMA: NOT DETECTED

## 2019-11-17 LAB — TRICHOMONAS VAGINALIS, PROBE AMP: Trichomonas vaginalis RNA: DETECTED — AB

## 2019-11-20 ENCOUNTER — Telehealth: Payer: Self-pay

## 2019-11-20 ENCOUNTER — Encounter: Payer: Self-pay | Admitting: Internal Medicine

## 2019-11-20 MED ORDER — METRONIDAZOLE 500 MG PO TABS: 500.0000 mg | ORAL_TABLET | Freq: Two times a day (BID) | ORAL | 0 refills | Status: AC

## 2019-11-20 NOTE — Telephone Encounter (Cosign Needed)
Will send in oral flagyl. Needs test of cure in 1 month, lab only,.

## 2019-11-20 NOTE — Telephone Encounter (Signed)
Patient contacted the office and states that she wants Shawna Orleans or another provider to call her ASAP regarding her lab results/STD screening. She states she is unsure what is going on and she wants more clarification, and she wants her tx for this to be sent in as soon as possible. I did advise that Rene Kocher is out of the office, but I will route this to covering provider to see what we need to do.,

## 2019-11-20 NOTE — Addendum Note (Signed)
Addended by: [name] on: 11/20/2019 02:28 PM   Modules accepted: Orders

## 2019-11-20 NOTE — Telephone Encounter (Signed)
Patient contacted the office and states that she was received a note on MyChart on 7/30, and was advised by Rene Kocher that her STD screening was negative, but she states she had a lab result come through on her MyChart yesterday evening around 11:00, and it states that Trichomonas was detected. She is wondering what she should do?

## 2019-11-28 ENCOUNTER — Encounter: Payer: Self-pay | Admitting: Internal Medicine

## 2019-11-28 ENCOUNTER — Other Ambulatory Visit: Payer: Self-pay

## 2019-11-28 ENCOUNTER — Ambulatory Visit: Payer: BC Managed Care – PPO | Admitting: Internal Medicine

## 2019-11-28 VITALS — BP 124/76 | HR 73 | Temp 98.1°F | Wt 213.0 lb

## 2019-11-28 DIAGNOSIS — B181 Chronic viral hepatitis B without delta-agent: Secondary | ICD-10-CM

## 2019-11-28 NOTE — Progress Notes (Signed)
Subjective:    Patient ID: Diana [name], female    DOB: 31-Mar-1961, 59 y.o.   MRN: [id number]  HPI  Patient presents the clinic today for follow up of recent hepatitis labs. She does not understand the antibodies, and what they mean when they show reactive and non reactive. She is concerned that she still has an active Hep B infection due her ongoing abdominal issues. She is concerned because her GI doctor took her off the medication to the treat the Hep B. She is not able to get back in with GI until the end of September.  Review of Systems      Past Medical History:  Diagnosis Date  . Allergy   . Ear infection    recurrent    Current Outpatient Medications  Medication Sig Dispense Refill  . Ascorbic Acid (VITAMIN C ADULT GUMMIES) 125 MG CHEW Chew by mouth.    Marland Kitchen buPROPion (WELLBUTRIN XL) 150 MG 24 hr tablet Take 1 tablet (150 mg total) by mouth daily. 30 tablet 2  . cetirizine (ZYRTEC) 10 MG tablet Take 10 mg by mouth daily.    . Cholecalciferol (VITAMIN D3) 25 MCG (1000 UT) CAPS Take 5,000 Units by mouth.     . clonazePAM (KLONOPIN) 0.5 MG tablet Take 1 tablet (0.5 mg total) by mouth daily as needed. for anxiety 30 tablet 0  . hydrochlorothiazide (MICROZIDE) 12.5 MG capsule Take 1 capsule (12.5 mg total) by mouth daily. 30 capsule 2  . hydrocortisone (ANUSOL-HC) 2.5 % rectal cream PLACE 1 APPLICATION RECTALLY 2 TIMES DAILY. 30 g 2  . omeprazole (PRILOSEC) 40 MG capsule Take 1 capsule (40 mg total) by mouth daily. 30 capsule 2  . Zinc 50 MG CAPS Take by mouth.     No current facility-administered medications for this visit.    No Known Allergies  Family History  Problem Relation Age of Onset  . Hypertension Mother        alive  . Arthritis Mother   . Lung cancer Father        deceased  . Heart disease Father   . Other Other        1 brother and 1 sister both alive an healthy  . Hypertension Maternal Grandmother     Social History   Socioeconomic History  .  Marital status: Widowed    Spouse name: Not on file  . Number of children: 3  . Years of education: Not on file  . Highest education level: Not on file  Occupational History  . Occupation: Nutritional therapist: [employer]  Tobacco Use  . Smoking status: Former Smoker    Types: Cigarettes    Quit date: 04/20/1985    Years since quitting: 34.6  . Smokeless tobacco: Never Used  . Tobacco comment: remote tobacco use   Substance and Sexual Activity  . Alcohol use: No    Alcohol/week: 0.0 standard drinks  . Drug use: No  . Sexual activity: Never  Other Topics Concern  . Not on file  Social History Narrative  . Not on file   Social Determinants of Health   Financial Resource Strain:   . Difficulty of Paying Living Expenses:   Food Insecurity:   . Worried About Programme researcher, broadcasting/film/video in the Last Year:   . Barista in the Last Year:   Transportation Needs:   . Freight forwarder (Medical):   Marland Kitchen Lack of Transportation (Non-Medical):  Physical Activity:   . Days of Exercise per Week:   . Minutes of Exercise per Session:   Stress:   . Feeling of Stress :   Social Connections:   . Frequency of Communication with Friends and Family:   . Frequency of Social Gatherings with Friends and Family:   . Attends Religious Services:   . Active Member of Clubs or Organizations:   . Attends Banker Meetings:   Marland Kitchen Marital Status:   Intimate Partner Violence:   . Fear of Current or Ex-Partner:   . Emotionally Abused:   Marland Kitchen Physically Abused:   . Sexually Abused:      Constitutional: Denies fever, malaise, fatigue, headache or abrupt weight changes.  Respiratory: Denies difficulty breathing, shortness of breath, cough or sputum production.   Cardiovascular: Denies chest pain, chest tightness, palpitations or swelling in the hands or feet.  Gastrointestinal: Pt reports intermittent abdominal pain, bloating. Denies abdominal constipation, diarrhea or blood in the  stool.   No other specific complaints in a complete review of systems (except as listed in HPI above).  Objective:   Physical Exam  BP 124/76   Pulse 73   Temp 98.1 F (36.7 C) (Temporal)   Wt 213 lb (96.6 kg)   LMP 01/20/2016   SpO2 98%   BMI 33.61 kg/m   Wt Readings from Last 3 Encounters:  11/16/19 (!) 209 lb (94.8 kg)  10/03/19 212 lb (96.2 kg)  09/19/19 206 lb 8 oz (93.7 kg)    General: Appears her stated age, obese, in NAD. Cardiovascular: Normal rate. Pulmonary/Chest: Normal effort. Abdomen: . Normal bowel sounds. No distention or masses noted. Neurological: Alert and oriented.   BMET    Component Value Date/Time   NA 140 09/19/2019 1554   K 3.7 09/19/2019 1554   CL 101 09/19/2019 1554   CO2 33 (H) 09/19/2019 1554   GLUCOSE 106 (H) 09/19/2019 1554   BUN 15 09/19/2019 1554   CREATININE 0.66 09/19/2019 1554   CALCIUM 9.7 09/19/2019 1554   GFRNONAA >60 07/27/2017 2153   GFRAA >60 07/27/2017 2153    Lipid Panel     Component Value Date/Time   CHOL 193 02/09/2019 1245   TRIG 105.0 02/09/2019 1245   HDL 18.00 (L) 02/09/2019 1245   CHOLHDL 11 02/09/2019 1245   VLDL 21.0 02/09/2019 1245   LDLCALC 154 (H) 02/09/2019 1245    CBC    Component Value Date/Time   WBC 4.7 09/19/2019 1554   RBC 4.59 09/19/2019 1554   HGB 13.9 09/19/2019 1554   HCT 41.1 09/19/2019 1554   PLT 222.0 09/19/2019 1554   MCV 89.5 09/19/2019 1554   MCH 29.7 07/27/2017 1023   MCHC 33.8 09/19/2019 1554   RDW 14.4 09/19/2019 1554   LYMPHSABS 1.1 07/28/2016 1232   MONOABS 0.4 07/28/2016 1232   EOSABS 0.0 07/28/2016 1232   BASOSABS 0.0 07/28/2016 1232    Hgb A1C Lab Results  Component Value Date   HGBA1C 5.9 02/09/2019            Assessment & Plan:   Chronic Hep B:  Reviewed Hep B surface antibody and advised her this will always be reactive Will check Hep B viral load today. Advised her if this is undetectable, this means she does not have an active infection. She  will keep her appt with GI in September  Return precautions discussed [name], NP This visit occurred during the SARS-CoV-2 public health emergency.  Safety protocols were in  place, including screening questions prior to the visit, additional usage of staff PPE, and extensive cleaning of exam room while observing appropriate contact time as indicated for disinfecting solutions.

## 2019-11-28 NOTE — Patient Instructions (Signed)
Hepatitis B Antigen and Antibody Tests Why am I having this test? These tests may be done:  To diagnose hepatitis B infection.  To check for infection, if there is concern that you have been exposed to hepatitis B.  To find the cause of long-term (chronic) liver disease or abnormal liver function test results.  To see if you are already protected from (immune to) hepatitis B.  To see if you need the hepatitis B vaccine to protect you from future infection. What is being tested? Testing for hepatitis B virus (HBV) may be done by checking your blood for the presence of the virus itself (antigen) or for the presence of infection-fighting proteins (antibodies). Hepatitis B antibodies are produced by your body's disease-fighting system (immune system) in response to an HBV infection. The antibodies are also produced after you have gotten the hepatitis B vaccine. Different types of HBV antibodies may be present in your blood, depending on how long it has been since infection or vaccination occurred. Different types of HBV antigens can also be detected. The hepatitis B antigen and antibody tests detect the presence of these antigens or antibodies. What kind of sample is taken?  A blood sample is required for the hepatitis B antigen and antibody tests. It is usually collected by inserting a needle into a blood vessel. How are the results reported? Your test results will be reported as either positive or negative for the types of hepatitis B antigens or antibodies tested for. What do the results mean? The test results are based on which antibodies your blood has (is positive for) and which antibodies your blood does not have (is negative for). The test results are also based on whether hepatitis B antigen molecules are found in your blood.  Sometimes, the test results may report that a condition is present when it is not present (false-positive result).  Sometimes, the test results may report that a  condition is not present when it is present (false-negative result). Negative results Results that are negative for hepatitis B antigens may mean:  You do not have a current or active hepatitis B infection. Results that are negative for hepatitis B antibodies may mean:  You are not immune to HBV from past exposure or from vaccines. Positive results Results that are positive for hepatitis B antigens may mean:  You have a current, active, or long-term (chronic) hepatitis B infection. Results that are positive for hepatitis B antibodies may mean:  You are immune to HBV from past exposure or from vaccines. Talk with your health care provider about what your results mean. Questions to ask your health care provider Ask your health care provider, or the department that is doing the test:  When will my results be ready?  How will I get my results?  What are my treatment options?  What other tests do I need?  What are my next steps? Summary  Testing for hepatitis B virus (HBV) may be done by checking your blood for the presence of the virus itself (antigen) or for the presence of infection-fighting proteins (antibodies).  Hepatitis B antibodies are produced by your body's disease-fighting system (immune system) in response to an infection with HBV. These antibodies are also produced after you have gotten the hepatitis B vaccine.  A blood sample is required for the hepatitis B antigen and antibody tests. It is usually collected by inserting a needle into a blood vessel.  The test results are based on which antibodies your blood has (  is positive for) and which antibodies your blood does not have (is negative for). The test results are also based on whether hepatitis B antigens are found in your blood. This information is not intended to replace advice given to you by your health care provider. Make sure you discuss any questions you have with your health care provider. Document Revised:  03/19/2017 Document Reviewed: 03/18/2017 Elsevier Patient Education  2020 Elsevier Inc.  

## 2019-12-01 LAB — HEPATITIS B DNA, ULTRAQUANTITATIVE, PCR
Hepatitis B DNA (Calc): <1 Log IU/mL
Hepatitis B DNA: <10 IU/mL

## 2019-12-20 ENCOUNTER — Telehealth: Payer: Self-pay | Admitting: Internal Medicine

## 2019-12-20 NOTE — Telephone Encounter (Signed)
Pt called to schedule follow up Thursday or Friday no appointments.  She stated it was follow up test she had done, to be retested.  Does she just need labs only.

## 2019-12-22 NOTE — Telephone Encounter (Signed)
Pt has called back asking if she would be able to be retested at her OV with Dr Patsy Lager next week. She is leaving 12-30-19 for a week. No one ever got back with her to make an OV with Rene Kocher this week to test.

## 2019-12-27 ENCOUNTER — Other Ambulatory Visit: Payer: Self-pay

## 2019-12-27 ENCOUNTER — Ambulatory Visit: Payer: BC Managed Care – PPO | Admitting: Family Medicine

## 2019-12-27 VITALS — BP 140/94 | HR 68 | Temp 97.5°F | Ht 66.75 in | Wt 208.5 lb

## 2019-12-27 DIAGNOSIS — M5416 Radiculopathy, lumbar region: Secondary | ICD-10-CM

## 2019-12-27 DIAGNOSIS — G5703 Lesion of sciatic nerve, bilateral lower limbs: Secondary | ICD-10-CM

## 2019-12-27 DIAGNOSIS — Z113 Encounter for screening for infections with a predominantly sexual mode of transmission: Secondary | ICD-10-CM

## 2019-12-27 MED ORDER — PREDNISONE 20 MG PO TABS: ORAL_TABLET | ORAL | 0 refills | Status: DC

## 2019-12-27 NOTE — Progress Notes (Signed)
Candies Palm T. Cohl Behrens, MD, CAQ Sports Medicine  Primary Care and Sports Medicine [facility] at [facility] [address] [place], [address]  Phone: [phone]  FAX: [phone]  Diana [name] - 59 y.o. female  MRN [id number]  Date of Birth: August 14, 1960  Date: 12/27/2019  PCP: [name], NP  Referral: [name], NP  Chief Complaint  Patient presents with  . Back Pain    lower back and tail bone. x1 wk.  Pt states feel the sitting to standing.    This visit occurred during the SARS-CoV-2 public health emergency.  Safety protocols were in place, including screening questions prior to the visit, additional usage of staff PPE, and extensive cleaning of exam room while observing appropriate contact time as indicated for disinfecting solutions.   Subjective:   Diana [name] is a 59 y.o. very pleasant female patient with Body mass index is 32.9 kg/m. who presents with the following:  She is a pleasant lady at age 59 who has a history of having some back pain intermittently for quite some time, and this is worsened today with some pain of the lowest aspect upper lumbosacral spine as well as in the sacrum itself per the patient.  This is been ongoing for about 1 week, and she does feel some pain and burning when she rises to a seated position.  She also does have some radicular pain down both of her legs at times.  She also has a burning sensation in her posterior pelvis.  She has not had any provokable injury that she can recall.  Historically she has had 2 epidural steroids in the past, but these are relatively remote.  She has already tried some Robaxin, Flexeril, as well as over the counter remedies including NSAIDs.  Tailbone, rad down both legs.  Feels like her butt is burning.  Has had some back problems.    Review of Systems is noted in the HPI, as appropriate   Objective:   BP (!) 140/94   Pulse 68   Temp (!) 97.5 F (36.4 C)  (Temporal)   Ht 5' 6.75" (1.695 m)   Wt 208 lb 8 oz (94.6 kg)   LMP 01/20/2016   SpO2 99%   BMI 32.90 kg/m    GEN: No acute distress; alert,appropriate. PULM: Breathing comfortably in no respiratory distress PSYCH: Normally interactive.   Range of motion at  the waist: Flexion: normal Extension: normal Lateral bending: normal Rotation: all normal  No echymosis or edema Rises to examination table with no difficulty Gait: non antalgic  Inspection/Deformity: N Paraspinus Tenderness: L5-S1 and also some tenderness along the lateral sacrum.  B Ankle Dorsiflexion (L5,4): 5/5 B Great Toe Dorsiflexion (L5,4): 5/5 Heel Walk (L5): WNL Toe Walk (S1): WNL Rise/Squat (L4): WNL  SENSORY B Medial Foot (L4): WNL B Dorsum (L5): WNL B Lateral (S1): WNL Light Touch: WNL Pinprick: WNL  REFLEXES Knee (L4): 2+ Ankle (S1): 2+  B SLR, seated: neg B SLR, supine: neg B FABER: Posterior pain  B Reverse FABER: Some discomfort and pain B Greater Troch: NT B Log Roll: neg B Sciatic Notch: NT   She does have pain to direct palpation at the piriformis.  Radiology: No results found.  Assessment and Plan:     ICD-10-CM   1. Lumbar radiculopathy, acute  M54.16   2. Piriformis syndrome of both sides  G57.03   3. Screen for STD (sexually transmitted disease)  Z11.3 Trichomonas  vaginalis, RNA   Acute on chronic lumbar radiculopathy with exacerbation and some sacroiliac as well as piriformis involvement.  Reviewed PT with the patient again with an emphasis on piriformis as well as SI joint rehab.  With her history, I will also give her some steroids to take.  Follow-up: No follow-ups on file.  Meds ordered this encounter  Medications  . predniSONE (DELTASONE) 20 MG tablet    Sig: 2 tabs po daily for 5 days, then 1 tab po daily for 5 days    Dispense:  15 tablet    Refill:  0   There are no discontinued medications. Orders Placed This Encounter  Procedures  . Trichomonas  vaginalis, RNA    Signed,  Jeevan Kalla T. Sheretha Shadd, MD   Outpatient Encounter Medications as of 12/27/2019  Medication Sig  . Ascorbic Acid (VITAMIN C ADULT GUMMIES) 125 MG CHEW Chew by mouth.  . cetirizine (ZYRTEC) 10 MG tablet Take 10 mg by mouth daily.  . Cholecalciferol (VITAMIN D3) 25 MCG (1000 UT) CAPS Take 5,000 Units by mouth.   . clonazePAM (KLONOPIN) 0.5 MG tablet Take 1 tablet (0.5 mg total) by mouth daily as needed. for anxiety  . erythromycin ophthalmic ointment SMARTSIG:1 Inch(es) Right Eye Every Night  . hydrochlorothiazide (MICROZIDE) 12.5 MG capsule Take 1 capsule (12.5 mg total) by mouth daily.  . hydrocortisone (ANUSOL-HC) 2.5 % rectal cream PLACE 1 APPLICATION RECTALLY 2 TIMES DAILY.  Marland Kitchen omeprazole (PRILOSEC) 20 MG capsule Take 20 mg by mouth daily.  Marland Kitchen omeprazole (PRILOSEC) 40 MG capsule Take 1 capsule (40 mg total) by mouth daily.  Marland Kitchen tobramycin (TOBREX) 0.3 % ophthalmic solution Place 1 drop into the right eye 4 (four) times daily.  . Zinc 50 MG CAPS Take by mouth.  Marland Kitchen buPROPion (WELLBUTRIN XL) 150 MG 24 hr tablet Take 1 tablet (150 mg total) by mouth daily. (Patient not taking: Reported on 12/27/2019)  . predniSONE (DELTASONE) 20 MG tablet 2 tabs po daily for 5 days, then 1 tab po daily for 5 days   No facility-administered encounter medications on file as of 12/27/2019.

## 2019-12-28 LAB — TRICHOMONAS VAGINALIS, PROBE AMP: Trichomonas vaginalis RNA: NOT DETECTED

## 2019-12-31 ENCOUNTER — Encounter: Payer: Self-pay | Admitting: Family Medicine

## 2020-01-05 ENCOUNTER — Ambulatory Visit: Payer: BC Managed Care – PPO | Admitting: Gastroenterology

## 2020-01-09 ENCOUNTER — Ambulatory Visit: Payer: BC Managed Care – PPO | Admitting: Gastroenterology

## 2020-01-09 ENCOUNTER — Other Ambulatory Visit: Payer: Self-pay

## 2020-01-09 ENCOUNTER — Encounter: Payer: Self-pay | Admitting: Gastroenterology

## 2020-01-09 VITALS — BP 124/81 | HR 64 | Temp 97.8°F | Ht 66.75 in | Wt 208.4 lb

## 2020-01-09 DIAGNOSIS — K760 Fatty (change of) liver, not elsewhere classified: Secondary | ICD-10-CM | POA: Diagnosis not present

## 2020-01-09 DIAGNOSIS — B181 Chronic viral hepatitis B without delta-agent: Secondary | ICD-10-CM

## 2020-01-09 DIAGNOSIS — Z1211 Encounter for screening for malignant neoplasm of colon: Secondary | ICD-10-CM

## 2020-01-09 MED ORDER — NA SULFATE-K SULFATE-MG SULF 17.5-3.13-1.6 GM/177ML PO SOLN: 354.0000 mL | Freq: Once | ORAL | 0 refills | Status: AC

## 2020-01-09 NOTE — Patient Instructions (Signed)
Ultrasound is scheduled for 01/19/2020 [facility] at 8:30am for a 9:00am scan. Nothing to eat or drink after midnight.

## 2020-01-09 NOTE — Progress Notes (Signed)
Diana Repress, MD [address]  [address]  [place], [place] [address]  Main: [phone]  Fax: [phone]    Gastroenterology Consultation  Referring Provider:     [name], NP Primary Care Physician:  Diana Munroe, NP Primary Gastroenterologist:  Dr. Arlyss [name] Reason for Consultation:  Chronic hepatitis B infection, fatty liver, colon cancer screening        HPI:   Diana [name] is a 59 y.o. female referred by Dr. Sampson [name], Salvadore Oxford, NP  for consultation & management of chronic hepatitis B infection.Diana [name] has history of hepatitis B infection, treated with entecavir with no detection of active infection for last 2 years, E antigen negative, E antibody positive.   And, viral load is undetectable most recently in 11/2019. She does have fatty liver. She denies any GI symptoms today. LFTs are normal except for indirect hyperbilirubinemia.  She is also overdue for colon cancer screening  NSAIDs: she recent longer taking NSAIDs  Antiplts/Anticoagulants/Anti thrombotics: none  GI Procedures: none She denies family history of colon cancer  Ultrasound-guided liver biopsy 07/27/2017 DIAGNOSIS:  A. LIVER; ULTRASOUND-GUIDED CORE BIOPSY:  - HEPATITIC PROCESS WITH MILD TO MODERATE PORTAL AND LOBULAR  INFLAMMATION, CONSISTENT WITH VIRAL HEPATITIS B, SEE COMMENT.  - NEGATIVE FOR FIBROSIS.  - NEGATIVE FOR STEATOSIS.   Past Medical History:  Diagnosis Date  . Allergy   . Ear infection    recurrent    Past Surgical History:  Procedure Laterality Date  . ABDOMINAL HYSTERECTOMY  2019   partial  . TONSILLECTOMY    . TUBAL LIGATION     bilateral     Current Outpatient Medications:  .  Ascorbic Acid (VITAMIN C ADULT GUMMIES) 125 MG CHEW, Chew by mouth., Disp: , Rfl:  .  cetirizine (ZYRTEC) 10 MG tablet, Take 10 mg by mouth daily., Disp: , Rfl:  .  Cholecalciferol (VITAMIN D3) 25 MCG (1000 UT) CAPS, Take 5,000 Units by mouth. , Disp: , Rfl:  .   clonazePAM (KLONOPIN) 0.5 MG tablet, Take 1 tablet (0.5 mg total) by mouth daily as needed. for anxiety, Disp: 30 tablet, Rfl: 0 .  hydrochlorothiazide (MICROZIDE) 12.5 MG capsule, Take 1 capsule (12.5 mg total) by mouth daily., Disp: 30 capsule, Rfl: 2 .  hydrocortisone (ANUSOL-HC) 2.5 % rectal cream, PLACE 1 APPLICATION RECTALLY 2 TIMES DAILY., Disp: 30 g, Rfl: 2 .  omeprazole (PRILOSEC) 20 MG capsule, Take 20 mg by mouth daily., Disp: , Rfl:  .  Zinc 50 MG CAPS, Take by mouth., Disp: , Rfl:  .  Na Sulfate-K Sulfate-Mg Sulf 17.5-3.13-1.6 GM/177ML SOLN, Take 354 mLs by mouth once for 1 dose., Disp: 354 mL, Rfl: 0   Family History  Problem Relation Age of Onset  . Hypertension Mother        alive  . Arthritis Mother   . Lung cancer Father        deceased  . Heart disease Father   . Other Other        1 brother and 1 sister both alive an healthy  . Hypertension Maternal Grandmother      Social History   Tobacco Use  . Smoking status: Former Smoker    Types: Cigarettes    Quit date: 04/20/1985    Years since quitting: 34.7  . Smokeless tobacco: Never Used  . Tobacco comment: remote tobacco use   Substance Use Topics  . Alcohol use: No    Alcohol/week: 0.0 standard  drinks  . Drug use: No    Allergies as of 01/09/2020  . (No Known Allergies)    Review of Systems:    All systems reviewed and negative except where noted in HPI.   Physical Exam:  BP 124/81 (BP Location: Left Arm, Patient Position: Sitting, Cuff Size: Normal)   Pulse 64   Temp 97.8 F (36.6 C) (Oral)   Ht 5' 6.75" (1.695 m)   Wt 208 lb 6 oz (94.5 kg)   LMP 01/20/2016   BMI 32.88 kg/m  Patient's last menstrual period was 01/20/2016.  General:   Alert,  Well-developed, well-nourished, pleasant and cooperative in NAD Head:  Normocephalic and atraumatic. Eyes:  Sclera clear, no icterus.   Conjunctiva pink. Ears:  Normal auditory acuity. Nose:  No deformity, discharge, or lesions. Mouth:  No deformity or  lesions,oropharynx pink & moist. Neck:  Supple; no masses or thyromegaly. Lungs:  Respirations even and unlabored.  Clear throughout to auscultation.   No wheezes, crackles, or rhonchi. No acute distress. Heart:  Regular rate and rhythm; no murmurs, clicks, rubs, or gallops. Abdomen:  Normal bowel sounds. Soft, obese, non-tender and non-distended without masses, hepatosplenomegaly or hernias noted.  No guarding or rebound tenderness.   Rectal: Not performed Msk:  Symmetrical without gross deformities. Good, equal movement & strength bilaterally. Pulses:  Normal pulses noted. Extremities:  No clubbing or edema.  No cyanosis. Neurologic:  Alert and oriented x3;  grossly normal neurologically. Skin:  Intact without significant lesions or rashes. No jaundice. Psych:  Alert and cooperative. Normal mood and affect.  Imaging Studies: Last ultrasound abdomen in 05/2015 which was unremarkable  Ultrasound abdomen 06/2017 unremarkable with no evidence of cirrhosis and no portal hypertension  Assessment and Plan:   Diana [name] is a 58 y.o. female with obesity, hypertension, GERD is here for follow-up of chronic hepatitis B infection. Her hepatitis D antigen is negative. She does not have clinical signs or symptoms to suggest chronic liver disease. There is no evidence of portal hypertension. Secondary liver disease workup revealed weakly positive anti-smooth muscle antibodies. She does not have hep C. Status post liver biopsy.  Patient did not tolerate viread, developed stomach upset and myalgias.  Therefore, treated with entecavir which was stopped in 07/2019.  Chronic hepatitis B infection, currently seroconverted, E antigen negative, E antibody positive and HBV DNA undetected as of 11/2019 - Okay to hold off on antiviral therapy at this time - Recheck LFTs, HBV DNA today every 6 months - Hepatitis B e antigen, hepatitis B e antibody today then every 6 months - HBsAg annually - to avoid alcohol use,  limit intake of NSAIDs and Tylenol use for arthralgias - Recommend right upper quadrant ultrasound for follow-up of fatty liver  Colon cancer screening: overdue Average risk, schedule colonoscopy I have discussed alternative options, risks & benefits,  which include, but are not limited to, bleeding, infection, perforation,respiratory complication & drug reaction.  The patient agrees with this plan & written consent will be obtained.    Follow up every 12 months   Diana Repress, MD

## 2020-01-19 ENCOUNTER — Ambulatory Visit: Payer: BC Managed Care – PPO

## 2020-01-24 ENCOUNTER — Other Ambulatory Visit: Payer: Self-pay

## 2020-01-24 ENCOUNTER — Ambulatory Visit (INDEPENDENT_AMBULATORY_CARE_PROVIDER_SITE_OTHER): Payer: BC Managed Care – PPO | Admitting: Internal Medicine

## 2020-01-24 ENCOUNTER — Encounter: Payer: Self-pay | Admitting: Internal Medicine

## 2020-01-24 VITALS — BP 126/84 | HR 82 | Temp 97.9°F | Wt 208.0 lb

## 2020-01-24 DIAGNOSIS — N949 Unspecified condition associated with female genital organs and menstrual cycle: Secondary | ICD-10-CM

## 2020-01-24 DIAGNOSIS — N898 Other specified noninflammatory disorders of vagina: Secondary | ICD-10-CM

## 2020-01-24 LAB — POC URINALSYSI DIPSTICK (AUTOMATED)
Bilirubin, UA: NEGATIVE
Blood, UA: NEGATIVE
Glucose, UA: NEGATIVE
Ketones, UA: NEGATIVE
Leukocytes, UA: NEGATIVE
Nitrite, UA: NEGATIVE
Protein, UA: NEGATIVE
Spec Grav, UA: >=1.03 — AB (ref 1.010–1.025)
Urobilinogen, UA: 0.2 E.U./dL
pH, UA: 5.5 (ref 5.0–8.0)

## 2020-01-24 NOTE — Patient Instructions (Signed)

## 2020-01-24 NOTE — Progress Notes (Signed)
HPI  Pt presents to the clinic today with c/o vaginal burning and itching with urination. This started 8 days ago. She denies pelvic pain, vaginal discharge, odor or abnormal bleeding. She denies urinary frequency, urgency, dysuria or blood in her urine. She denies fever, chills, nausea or low back pain. She went to UC for the same, has not heard about her urine results or vaginal swab. She was given Diflucan x 2, without any relief of symptoms. She has recently been sexually active with a new partner.   Review of Systems  Past Medical History:  Diagnosis Date  . Allergy   . Ear infection    recurrent    Family History  Problem Relation Age of Onset  . Hypertension Mother        alive  . Arthritis Mother   . Lung cancer Father        deceased  . Heart disease Father   . Other Other        1 brother and 1 sister both alive an healthy  . Hypertension Maternal Grandmother     Social History   Socioeconomic History  . Marital status: Widowed    Spouse name: Not on file  . Number of children: 3  . Years of education: Not on file  . Highest education level: Not on file  Occupational History  . Occupation: Nutritional therapist: [employer]  Tobacco Use  . Smoking status: Former Smoker    Types: Cigarettes    Quit date: 04/20/1985    Years since quitting: 34.7  . Smokeless tobacco: Never Used  . Tobacco comment: remote tobacco use   Substance and Sexual Activity  . Alcohol use: No    Alcohol/week: 0.0 standard drinks  . Drug use: No  . Sexual activity: Never  Other Topics Concern  . Not on file  Social History Narrative  . Not on file   Social Determinants of Health   Financial Resource Strain:   . Difficulty of Paying Living Expenses: Not on file  Food Insecurity:   . Worried About Programme researcher, broadcasting/film/video in the Last Year: Not on file  . Ran Out of Food in the Last Year: Not on file  Transportation Needs:   . Lack of Transportation (Medical): Not on file   . Lack of Transportation (Non-Medical): Not on file  Physical Activity:   . Days of Exercise per Week: Not on file  . Minutes of Exercise per Session: Not on file  Stress:   . Feeling of Stress : Not on file  Social Connections:   . Frequency of Communication with Friends and Family: Not on file  . Frequency of Social Gatherings with Friends and Family: Not on file  . Attends Religious Services: Not on file  . Active Member of Clubs or Organizations: Not on file  . Attends Banker Meetings: Not on file  . Marital Status: Not on file  Intimate Partner Violence:   . Fear of Current or Ex-Partner: Not on file  . Emotionally Abused: Not on file  . Physically Abused: Not on file  . Sexually Abused: Not on file    No Known Allergies   Constitutional: Denies fever, malaise, fatigue, headache or abrupt weight changes.   GU: Pt reports itching and burining with urination. Denies urgency, frequency, dysuria, blood in urine, odor or discharge. Skin: Denies redness, rashes, lesions or ulcercations.   No other specific complaints in a complete review  of systems (except as listed in HPI above).    Objective:   Physical Exam  BP 126/84   Pulse 82   Temp 97.9 F (36.6 C) (Temporal)   Wt 208 lb (94.3 kg)   LMP 01/20/2016   SpO2 98%   BMI 32.82 kg/m    Wt Readings from Last 3 Encounters:  01/09/20 208 lb 6 oz (94.5 kg)  12/27/19 208 lb 8 oz (94.6 kg)  11/28/19 213 lb (96.6 kg)    General: Appears her stated age, obese, in NAD. Cardiovascular: Normal rate and rhythm. S1,S2 noted.   Pulmonary/Chest: Normal effort and positive vesicular breath sounds. No respiratory distress. No wheezes, rales or ronchi noted.  Abdomen: Soft and nontender. Normal bowel sounds. No distention or masses noted.  No CVA tenderness. Pelvic: Self swabbed.        Assessment & Plan:   Vaginal Burning, Itching:  Urinalysis: negative Will send urine culture Will send wet prep to check  for yeast, trich and BV Will obtain urine gonorrhea and chalmydia Push fluids  RTC as needed or if symptoms persist. [name], NP This visit occurred during the SARS-CoV-2 public health emergency.  Safety protocols were in place, including screening questions prior to the visit, additional usage of staff PPE, and extensive cleaning of exam room while observing appropriate contact time as indicated for disinfecting solutions.

## 2020-01-24 NOTE — Addendum Note (Signed)
Addended by: Roena Malady on: 01/24/2020 04:51 PM   Modules accepted: Orders

## 2020-01-25 LAB — URINE CULTURE
MICRO NUMBER:: 11038990
SPECIMEN QUALITY:: ADEQUATE

## 2020-01-25 LAB — WET PREP BY MOLECULAR PROBE
Candida species: NOT DETECTED
Gardnerella vaginalis: NOT DETECTED
MICRO NUMBER:: 11038989
SPECIMEN QUALITY:: ADEQUATE
Trichomonas vaginosis: NOT DETECTED

## 2020-01-25 LAB — C. TRACHOMATIS/N. GONORRHOEAE RNA
C. trachomatis RNA, TMA: NOT DETECTED
N. gonorrhoeae RNA, TMA: NOT DETECTED

## 2020-02-01 ENCOUNTER — Other Ambulatory Visit: Payer: Self-pay | Admitting: Internal Medicine

## 2020-02-06 NOTE — Telephone Encounter (Signed)
Klonopin last filled 09/2019... please advise

## 2020-02-06 NOTE — Telephone Encounter (Signed)
Pt called checking on rx Pt is out of her meds  Best number for pt [phone]

## 2020-02-09 ENCOUNTER — Encounter: Payer: Self-pay | Admitting: Primary Care

## 2020-02-09 ENCOUNTER — Ambulatory Visit: Payer: BC Managed Care – PPO | Admitting: Primary Care

## 2020-02-09 ENCOUNTER — Other Ambulatory Visit: Payer: Self-pay

## 2020-02-09 VITALS — BP 122/74 | HR 77 | Temp 97.6°F | Ht 66.7 in | Wt 209.0 lb

## 2020-02-09 DIAGNOSIS — R600 Localized edema: Secondary | ICD-10-CM | POA: Diagnosis not present

## 2020-02-09 DIAGNOSIS — Z1231 Encounter for screening mammogram for malignant neoplasm of breast: Secondary | ICD-10-CM

## 2020-02-09 NOTE — Patient Instructions (Addendum)
  Elevate your legs when you are resting.  Consider purchasing compression socks and wear during work hours.  Reduce salty/fried/fast food, this will cause swelling and dehydration.  Ensure you are consuming 64 ounces of water daily.  It was a pleasure to see you today!    You have an appointment scheduled for: []   2D Mammogram  [x]   3D Mammogram  []   Bone Density    Call for appointment.    Your appointment will at the following location  [x]   [facility]  [address] [place] [facility] [place] [facility]  [phone]  []   [facility])   [address]  [place], [facility]  [phone]  []   [facility]      [address] [place], [place]        [address]         []   [facility]  [address] [place], [place]  [place]   Make sure to wear two peace clothing  No lotions powders or deodorants the day of the appointment Make sure to bring picture ID and insurance card.  Bring list of medications you are currently taking including any supplements.

## 2020-02-09 NOTE — Assessment & Plan Note (Addendum)
Endorses to bilateral hands and lower extremities. No evidence of this on exam today, normal pedal pulses.  She doesn't have symptoms for CHF.  Suspect the combination of her constant standing/walking and poor/salty diet are contributing.  Discussed to avoid highly salty food, fast food, frozen/canned food. Drink 64 oz water daily. Elevate legs when home. Purchase compression socks to wear at work.  She will update if no improvement.

## 2020-02-09 NOTE — Progress Notes (Signed)
Subjective:    Patient ID: Diana [name], female    DOB: 1960-05-19, 59 y.o.   MRN: [id number]  HPI  This visit occurred during the SARS-CoV-2 public health emergency.  Safety protocols were in place, including screening questions prior to the visit, additional usage of staff PPE, and extensive cleaning of exam room while observing appropriate contact time as indicated for disinfecting solutions.   [name] is a 59 year old female patient of Diana [name] with a history of hypertension, GERD, chronic hepatitis B infection, anxiety and depression who presents today with a chief complaint of extremity swelling.  She has noticed bilateral swelling to her hands and lower extremities that began about 10 days ago.  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 Her swelling has improved when she wakes in the morning, increases throughout the day. By then end of the day she notices a "band" around her skin from the bottom of her rolled up pants.   She endorses eating mostly fast food, numerous times daily, does not cook as she lives alone. Last night she had pizza. She denies exertional dyspnea, cough. She is compliant to her HCTZ. She does not wear compression socks.  BP Readings from Last 3 Encounters:  02/09/20 122/74  01/24/20 126/84  01/09/20 124/81     Review of Systems  Eyes: Negative for visual disturbance.  Respiratory: Negative for shortness of breath.   Cardiovascular: Positive for leg swelling. Negative for chest pain.  Skin: Negative for color change.       Past Medical History:  Diagnosis Date  . Allergy   . Ear infection    recurrent     Social History   Socioeconomic History  . Marital status: Widowed    Spouse name: Not on file  . Number of children: 3  . Years of education: Not on file  . Highest education level: Not on file  Occupational  History  . Occupation: Nutritional therapist: [employer]  Tobacco Use  . Smoking status: Former Smoker    Types: Cigarettes    Quit date: 04/20/1985    Years since quitting: 34.8  . Smokeless tobacco: Never Used  . Tobacco comment: remote tobacco use   Substance and Sexual Activity  . Alcohol use: No    Alcohol/week: 0.0 standard drinks  . Drug use: No  . Sexual activity: Never  Other Topics Concern  . Not on file  Social History Narrative  . Not on file   Social Determinants of Health   Financial Resource Strain:   . Difficulty of Paying Living Expenses: Not on file  Food Insecurity:   . Worried About Programme researcher, broadcasting/film/video in the Last Year: Not on file  . Ran Out of Food in the Last Year: Not on file  Transportation Needs:   . Lack of Transportation (Medical): Not on file  . Lack of Transportation (Non-Medical): Not on file  Physical Activity:   . Days of Exercise per Week: Not on file  . Minutes of Exercise per Session: Not on file  Stress:   . Feeling of Stress : Not on file  Social Connections:   . Frequency of Communication with Friends and Family: Not on file  . Frequency of Social Gatherings with Friends and Family: Not on file  . Attends Religious Services: Not on  file  . Active Member of Clubs or Organizations: Not on file  . Attends Banker Meetings: Not on file  . Marital Status: Not on file  Intimate Partner Violence:   . Fear of Current or Ex-Partner: Not on file  . Emotionally Abused: Not on file  . Physically Abused: Not on file  . Sexually Abused: Not on file    Past Surgical History:  Procedure Laterality Date  . ABDOMINAL HYSTERECTOMY  2019   partial  . TONSILLECTOMY    . TUBAL LIGATION     bilateral    Family History  Problem Relation Age of Onset  . Hypertension Mother        alive  . Arthritis Mother   . Lung cancer Father        deceased  . Heart disease Father   . Other Other        1 brother and 1 sister  both alive an healthy  . Hypertension Maternal Grandmother     No Known Allergies  Current Outpatient Medications on File Prior to Visit  Medication Sig Dispense Refill  . Ascorbic Acid (VITAMIN C ADULT GUMMIES) 125 MG CHEW Chew by mouth.    . cetirizine (ZYRTEC) 10 MG tablet Take 10 mg by mouth daily.    . Cholecalciferol (VITAMIN D3) 25 MCG (1000 UT) CAPS Take 5,000 Units by mouth.     . clonazePAM (KLONOPIN) 0.5 MG tablet TAKE 1 TABLET BY MOUTH DAILY AS NEEDED FOR ANXIETY 30 tablet 0  . hydrochlorothiazide (MICROZIDE) 12.5 MG capsule TAKE 1 CAPSULE BY MOUTH EVERY DAY 30 capsule 2  . hydrocortisone (ANUSOL-HC) 2.5 % rectal cream PLACE 1 APPLICATION RECTALLY 2 TIMES DAILY. 30 g 2  . omeprazole (PRILOSEC) 20 MG capsule Take 20 mg by mouth daily.    . Zinc 50 MG CAPS Take by mouth.     No current facility-administered medications on file prior to visit.    BP 122/74   Pulse 77   Temp 97.6 F (36.4 C) (Temporal)   Ht 5' 6.7" (1.694 m)   Wt 209 lb (94.8 kg)   LMP 01/20/2016   SpO2 98%   BMI 33.03 kg/m    Objective:   Physical Exam Cardiovascular:     Rate and Rhythm: Normal rate and regular rhythm.     Pulses:          Dorsalis pedis pulses are 2+ on the right side and 2+ on the left side.       Posterior tibial pulses are 2+ on the right side and 2+ on the left side.     Comments: No edema noted to hands or lower extremities. No pitting. Pulmonary:     Effort: Pulmonary effort is normal.     Breath sounds: Normal breath sounds.  Musculoskeletal:     Cervical back: Neck supple.  Skin:    General: Skin is warm and dry.            Assessment & Plan:

## 2020-02-26 ENCOUNTER — Other Ambulatory Visit: Payer: BC Managed Care – PPO | Attending: Gastroenterology

## 2020-02-28 ENCOUNTER — Encounter: Admission: RE | Payer: Self-pay | Source: Home / Self Care

## 2020-02-28 ENCOUNTER — Ambulatory Visit
Admission: RE | Admit: 2020-02-28 | Payer: BC Managed Care – PPO | Source: Home / Self Care | Admitting: Gastroenterology

## 2020-02-28 SURGERY — COLONOSCOPY WITH PROPOFOL
Anesthesia: General

## 2020-03-01 IMAGING — DX DG HAND COMPLETE 3+V*L*
3 series · 3 of 3 positions shown · non-contrast
Comparison: Left hand series dated December 07, 2017

CLINICAL DATA: Left hand pain since an injury 3 months ago. The
symptoms are centered over her second finger. Occasional swelling.

EXAM:
LEFT HAND - COMPLETE 3+ VIEW

[hand ap]
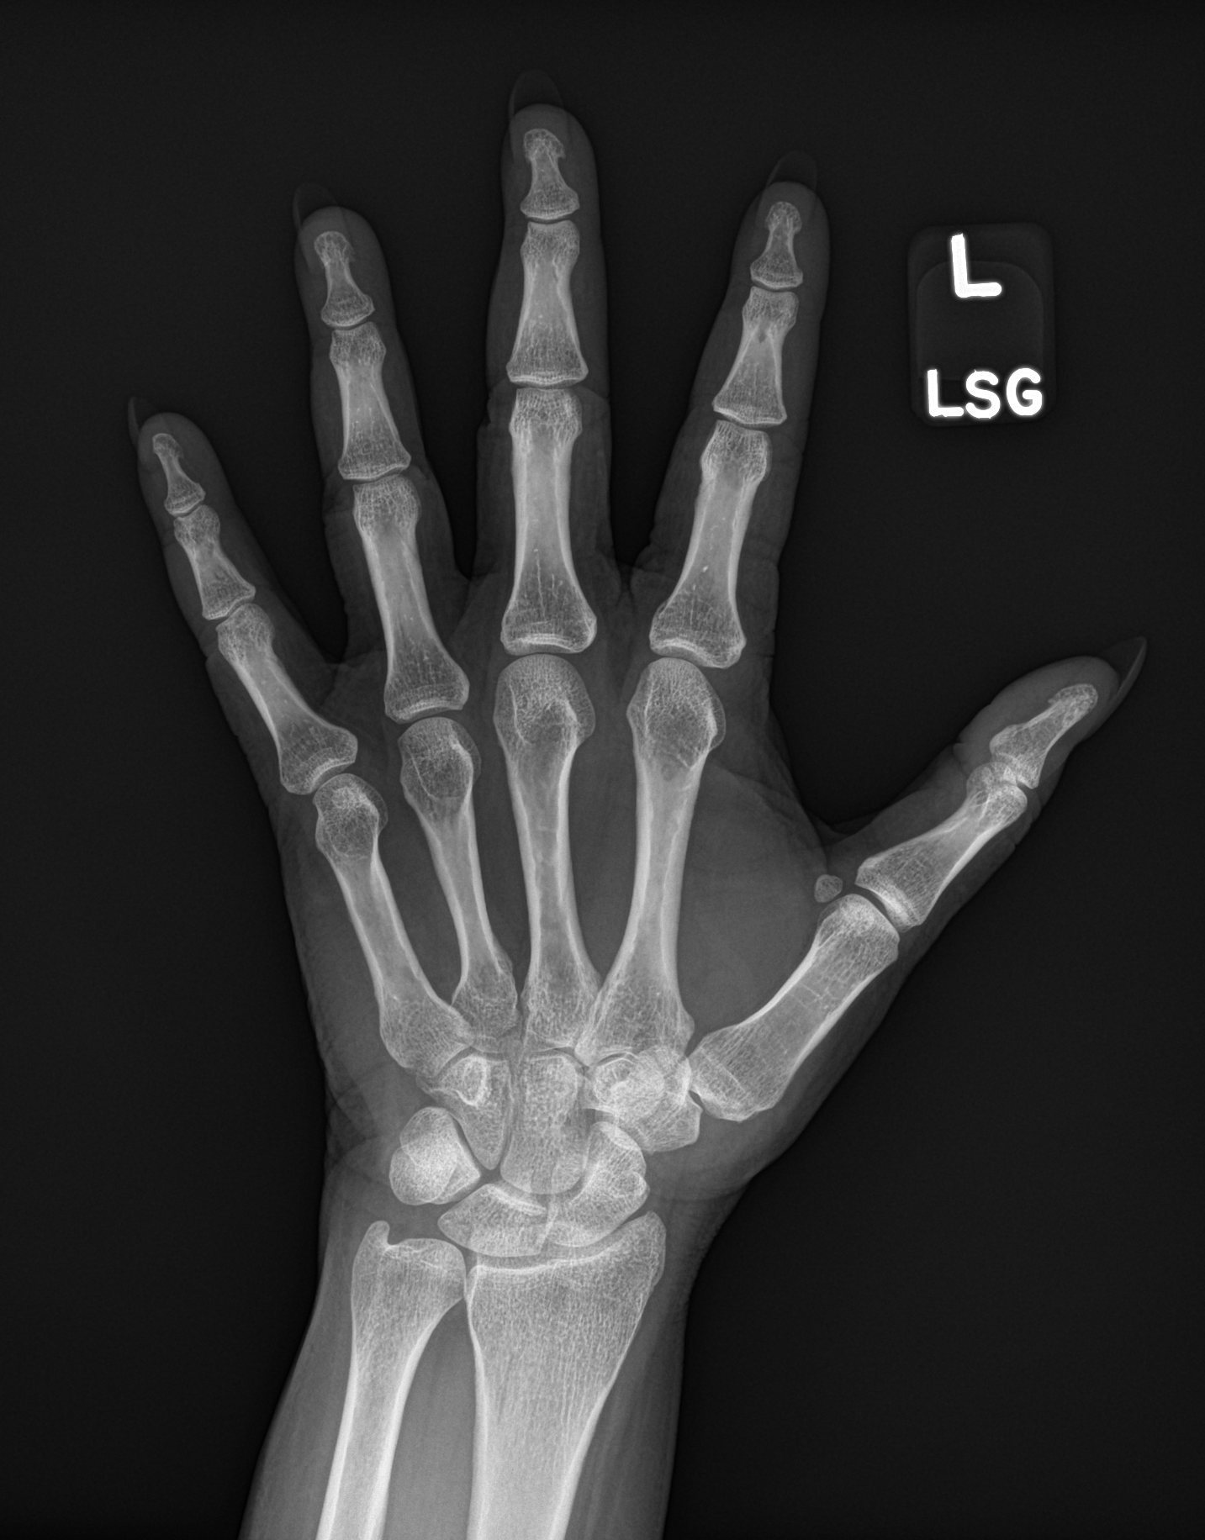

[hand obl]
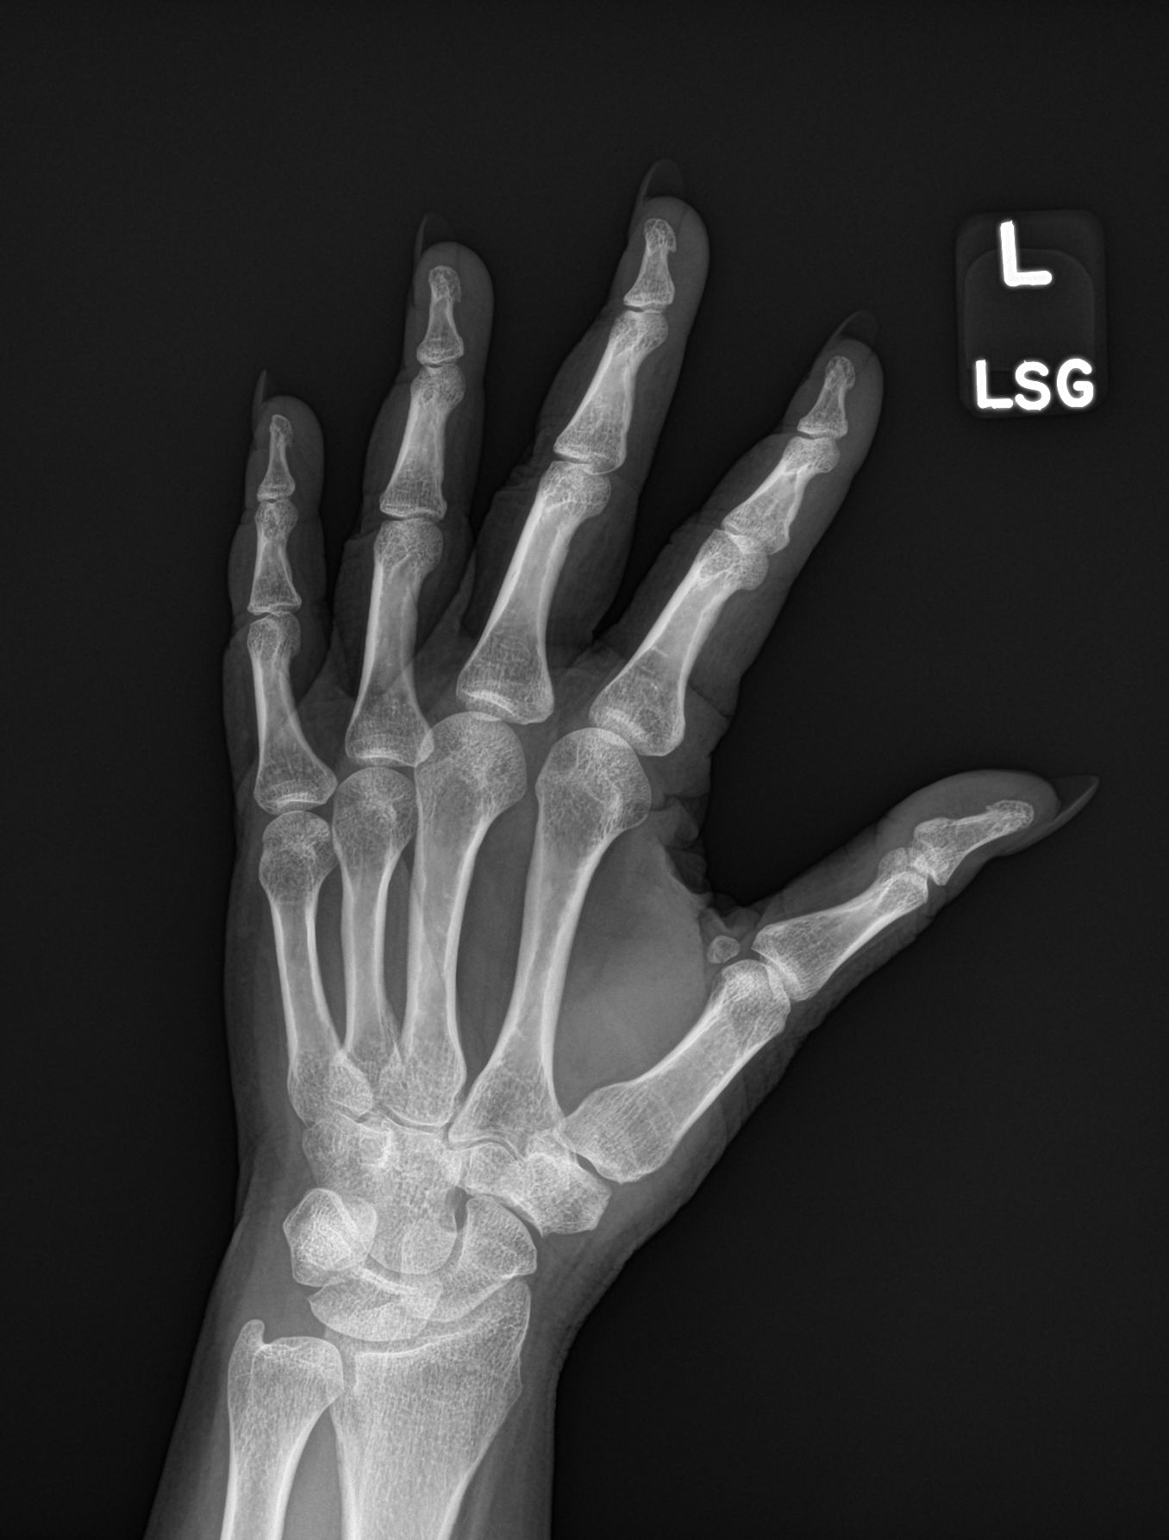

[hand lat]
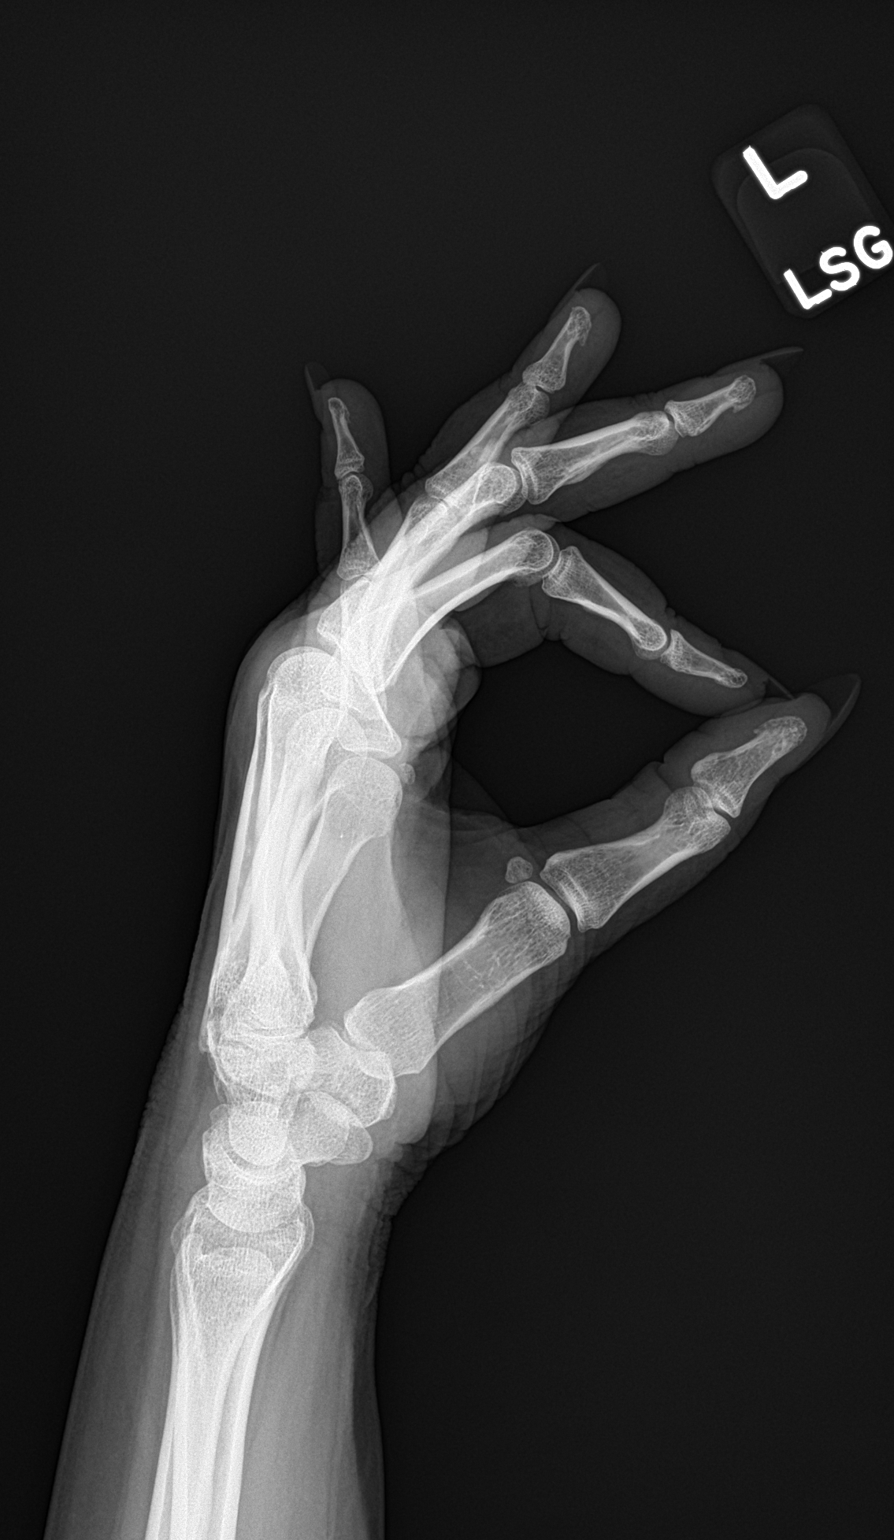

[3 of 3 positions shown; findings below may reference images not displayed]

FINDINGS: The bones are subjectively adequately mineralized. There is no acute
fracture nor dislocation. The joint spaces are well maintained. The
soft tissues are unremarkable.
IMPRESSION: There is no acute or significant chronic bony abnormality of the
left hand. Specific attention to the index finger reveals no acute
or healing fracture.

## 2020-03-04 ENCOUNTER — Other Ambulatory Visit: Payer: Self-pay | Admitting: Internal Medicine

## 2020-05-04 ENCOUNTER — Other Ambulatory Visit: Payer: Self-pay | Admitting: Internal Medicine

## 2020-05-21 ENCOUNTER — Other Ambulatory Visit: Payer: Self-pay | Admitting: Internal Medicine

## 2020-05-21 NOTE — Telephone Encounter (Signed)
Klonopin last filled 01/2020... please advise

## 2020-07-11 ENCOUNTER — Ambulatory Visit: Payer: BC Managed Care – PPO | Admitting: Internal Medicine

## 2020-07-11 ENCOUNTER — Other Ambulatory Visit: Payer: Self-pay

## 2020-07-11 ENCOUNTER — Encounter: Payer: Self-pay | Admitting: Internal Medicine

## 2020-07-11 VITALS — BP 122/82 | HR 74 | Temp 97.8°F | Wt 218.0 lb

## 2020-07-11 DIAGNOSIS — Z113 Encounter for screening for infections with a predominantly sexual mode of transmission: Secondary | ICD-10-CM

## 2020-07-11 DIAGNOSIS — R300 Dysuria: Secondary | ICD-10-CM | POA: Diagnosis not present

## 2020-07-11 NOTE — Progress Notes (Signed)
HPI  Pt presents to the clinic today with c/o dysuria. She reports this started 2 days ago. She denies urinary urgency, frequency, blood in her urine, bladder pressure, vaginal discharge, odor or abnormal vaginal bleeding. She denies fever, chills or nausea. She has not tried anything OTC for this. She would like STD screening.   Review of Systems  Past Medical History:  Diagnosis Date   Allergy    Ear infection    recurrent    Family History  Problem Relation Age of Onset   Hypertension Mother        alive   Arthritis Mother    Lung cancer Father        deceased   Heart disease Father    Other Other        1 brother and 1 sister both alive an healthy   Hypertension Maternal Grandmother     Social History   Socioeconomic History   Marital status: Widowed    Spouse name: Not on file   Number of children: 3   Years of education: Not on file   Highest education level: Not on file  Occupational History   Occupation: customer service    Employer: [employer]  Tobacco Use   Smoking status: Former Smoker    Types: Cigarettes    Quit date: 04/20/1985    Years since quitting: 35.2   Smokeless tobacco: Never Used   Tobacco comment: remote tobacco use   Substance and Sexual Activity   Alcohol use: No    Alcohol/week: 0.0 standard drinks   Drug use: No   Sexual activity: Never  Other Topics Concern   Not on file  Social History Narrative   Not on file   Social Determinants of Health   Financial Resource Strain: Not on file  Food Insecurity: Not on file  Transportation Needs: Not on file  Physical Activity: Not on file  Stress: Not on file  Social Connections: Not on file  Intimate Partner Violence: Not on file    No Known Allergies   Constitutional: Denies fever, malaise, fatigue, headache or abrupt weight changes.   GU: Pt reports pain with urination. Denies urgency,  burning sensation, blood in urine, odor or discharge. Skin: Denies  redness, rashes, lesions or ulcercations.   No other specific complaints in a complete review of systems (except as listed in HPI above).    Objective:   Physical Exam  BP 122/82    Pulse 74    Temp 97.8 F (36.6 C) (Temporal)    Wt 218 lb (98.9 kg)    LMP 01/20/2016    SpO2 98%    BMI 34.45 kg/m   Wt Readings from Last 3 Encounters:  02/09/20 209 lb (94.8 kg)  01/24/20 208 lb (94.3 kg)  01/09/20 208 lb 6 oz (94.5 kg)    General: Appears her stated age, obese, in NAD. Cardiovascular: Normal rate. Pulmonary/Chest: Normal effort. Abdomen:   No CVA tenderness.        Assessment & Plan:   Dysuria, Screen for STD:  Urinalysis: normal Will send urine culture Will check urine gonorrhea, chlamydia and trichomonas OK to take AZO OTC Drink plenty of fluids  RTC as needed or if symptoms persist. [name], NP This visit occurred during the SARS-CoV-2 public health emergency.  Safety protocols were in place, including screening questions prior to the visit, additional usage of staff PPE, and extensive cleaning of exam room while observing appropriate contact time as indicated  for disinfecting solutions.

## 2020-07-11 NOTE — Patient Instructions (Signed)

## 2020-07-11 NOTE — Addendum Note (Signed)
Addended by: Roena Malady on: 07/11/2020 04:15 PM   Modules accepted: Orders

## 2020-07-12 LAB — C. TRACHOMATIS/N. GONORRHOEAE RNA
C. trachomatis RNA, TMA: NOT DETECTED
N. gonorrhoeae RNA, TMA: NOT DETECTED

## 2020-07-12 LAB — TRICHOMONAS VAGINALIS, PROBE AMP: Trichomonas vaginalis RNA: NOT DETECTED

## 2020-07-18 ENCOUNTER — Other Ambulatory Visit: Payer: Self-pay | Admitting: Internal Medicine

## 2020-07-22 ENCOUNTER — Telehealth (INDEPENDENT_AMBULATORY_CARE_PROVIDER_SITE_OTHER): Payer: BC Managed Care – PPO | Admitting: Internal Medicine

## 2020-07-22 ENCOUNTER — Encounter: Payer: Self-pay | Admitting: Internal Medicine

## 2020-07-22 ENCOUNTER — Other Ambulatory Visit: Payer: Self-pay

## 2020-07-22 DIAGNOSIS — J329 Chronic sinusitis, unspecified: Secondary | ICD-10-CM

## 2020-07-22 DIAGNOSIS — B9789 Other viral agents as the cause of diseases classified elsewhere: Secondary | ICD-10-CM

## 2020-07-22 MED ORDER — PREDNISONE 10 MG PO TABS: ORAL_TABLET | ORAL | 0 refills | Status: DC

## 2020-07-22 NOTE — Progress Notes (Signed)
Virtual Visit via Video Note  I connected with Diana [name] on 07/22/20 at 12:00 PM EDT by a video enabled telemedicine application and verified that I am speaking with the correct person using two identifiers.  Location: Patient: In her car Provider: Office  Person's participating in this video call: [name], NP- C and Margo Aye   I discussed the limitations of evaluation and management by telemedicine and the availability of in person appointments. The patient expressed understanding and agreed to proceed.  History of Present Illness:   Pt reports headache, nasal congestion and sore throat.  She reports this started 2 days ago.  The headache is located in her forehead.  She describes the pain as pressure.  She is blowing yellow mucus out of her nose.  She did have some difficulty swallowing yesterday but this has improved.  She denies runny nose, ear pain, loss of taste/smell ear pain, loss of taste/smell, cough or shortness of breath.  She denies fever, chills or body aches.  She has tried ibuprofen, Zyrtec and Nasacort OTC with some relief of symptoms.  She has not had sick contacts that she is aware of.  She does not smoke.  Past Medical History:  Diagnosis Date  . Allergy   . Ear infection    recurrent    Current Outpatient Medications  Medication Sig Dispense Refill  . Ascorbic Acid 125 MG CHEW Chew by mouth.    . cetirizine (ZYRTEC) 10 MG tablet Take 10 mg by mouth daily.    . Cholecalciferol (VITAMIN D3) 25 MCG (1000 UT) CAPS Take 5,000 Units by mouth.     . clonazePAM (KLONOPIN) 0.5 MG tablet TAKE 1 TABLET BY MOUTH DAILY AS NEEDED FOR ANXIETY 30 tablet 0  . hydrochlorothiazide (MICROZIDE) 12.5 MG capsule TAKE 1 CAPSULE BY MOUTH DAILY. MUST SCHEDULE PHYSICAL EXAM 30 capsule 1  . hydrocortisone (ANUSOL-HC) 2.5 % rectal cream PLACE 1 APPLICATION RECTALLY 2 TIMES DAILY. 30 g 2  . omeprazole (PRILOSEC) 40 MG capsule TAKE 1 CAPSULE BY MOUTH DAILY. 30 capsule 2  . Zinc  50 MG CAPS Take by mouth.     No current facility-administered medications for this visit.    No Known Allergies  Family History  Problem Relation Age of Onset  . Hypertension Mother        alive  . Arthritis Mother   . Lung cancer Father        deceased  . Heart disease Father   . Other Other        1 brother and 1 sister both alive an healthy  . Hypertension Maternal Grandmother     Social History   Socioeconomic History  . Marital status: Widowed    Spouse name: Not on file  . Number of children: 3  . Years of education: Not on file  . Highest education level: Not on file  Occupational History  . Occupation: Nutritional therapist: [employer]  Tobacco Use  . Smoking status: Former Smoker    Types: Cigarettes    Quit date: 04/20/1985    Years since quitting: 35.2  . Smokeless tobacco: Never Used  . Tobacco comment: remote tobacco use   Substance and Sexual Activity  . Alcohol use: No    Alcohol/week: 0.0 standard drinks  . Drug use: No  . Sexual activity: Never  Other Topics Concern  . Not on file  Social History Narrative  . Not on file   Social  Determinants of Health   Financial Resource Strain: Not on file  Food Insecurity: Not on file  Transportation Needs: Not on file  Physical Activity: Not on file  Stress: Not on file  Social Connections: Not on file  Intimate Partner Violence: Not on file     Constitutional: Pt reports headache. Denies fever, malaise, fatigue, or abrupt weight changes.  HEENT: Pt reports nasal congestion and sore throat. Denies eye pain, eye redness, ear pain, ringing in the ears, wax buildup, runny nose, nasal congestion, bloody nose. Respiratory: Denies difficulty breathing, shortness of breath, cough or sputum production.   Cardiovascular: Denies chest pain, chest tightness, palpitations or swelling in the hands or feet.    No other specific complaints in a complete review of systems (except as listed in HPI  above).  Observations/Objective:  LMP 01/20/2016   Wt Readings from Last 3 Encounters:  07/11/20 218 lb (98.9 kg)  02/09/20 209 lb (94.8 kg)  01/24/20 208 lb (94.3 kg)    General: Appears her stated age, well developed, well nourished in NAD. HEENT: Head: normal shape and size;Nose:congestion noted; Throat/Mouth: hoarseness noted. Pulmonary/Chest: Normal effort and positive vesicular breath sounds. No respiratory distress. No wheezes, rales or ronchi noted.  Neurological: Alert and oriented.   BMET    Component Value Date/Time   NA 140 09/19/2019 1554   K 3.7 09/19/2019 1554   CL 101 09/19/2019 1554   CO2 33 (H) 09/19/2019 1554   GLUCOSE 106 (H) 09/19/2019 1554   BUN 15 09/19/2019 1554   CREATININE 0.66 09/19/2019 1554   CALCIUM 9.7 09/19/2019 1554   GFRNONAA >60 07/27/2017 2153   GFRAA >60 07/27/2017 2153    Lipid Panel     Component Value Date/Time   CHOL 193 02/09/2019 1245   TRIG 105.0 02/09/2019 1245   HDL 18.00 (L) 02/09/2019 1245   CHOLHDL 11 02/09/2019 1245   VLDL 21.0 02/09/2019 1245   LDLCALC 154 (H) 02/09/2019 1245    CBC    Component Value Date/Time   WBC 4.7 09/19/2019 1554   RBC 4.59 09/19/2019 1554   HGB 13.9 09/19/2019 1554   HCT 41.1 09/19/2019 1554   PLT 222.0 09/19/2019 1554   MCV 89.5 09/19/2019 1554   MCH 29.7 07/27/2017 1023   MCHC 33.8 09/19/2019 1554   RDW 14.4 09/19/2019 1554   LYMPHSABS 1.1 07/28/2016 1232   MONOABS 0.4 07/28/2016 1232   EOSABS 0.0 07/28/2016 1232   BASOSABS 0.0 07/28/2016 1232    Hgb A1C Lab Results  Component Value Date   HGBA1C 5.9 02/09/2019       Assessment and Plan:  Viral Sinusitis:  Continue Zyrtec Hold Nasocort while on Prednisone RX for Pred Taper x 6 days If no improvement by Friday, consider Augmentin 875-125 mg PO x 10 days  Return precautions discussed Follow Up Instructions:    I discussed the assessment and treatment plan with the patient. The patient was provided an  opportunity to ask questions and all were answered. The patient agreed with the plan and demonstrated an understanding of the instructions.   The patient was advised to call back or seek an in-person evaluation if the symptoms worsen or if the condition fails to improve as anticipated.     [name], NP

## 2020-07-22 NOTE — Patient Instructions (Signed)

## 2020-07-26 ENCOUNTER — Telehealth: Payer: Self-pay | Admitting: Internal Medicine

## 2020-07-26 NOTE — Telephone Encounter (Signed)
Patient called in as followup from visit this week. She states that she is not better at all. She is coughing up yellow and green mucus and blowing her nose all the time. She believes that she needs an antibiotic. EM

## 2020-07-29 MED ORDER — AMOXICILLIN-POT CLAVULANATE 875-125 MG PO TABS: 1.0000 | ORAL_TABLET | Freq: Two times a day (BID) | ORAL | 0 refills | Status: DC

## 2020-07-29 NOTE — Telephone Encounter (Signed)
Spoke with patient and informed her that prescription for Augmentin was sent to [facility]. Patient verbalized understanding.

## 2020-07-29 NOTE — Telephone Encounter (Signed)
Augmentin sent to [facility]

## 2020-07-29 NOTE — Addendum Note (Signed)
Addended by: [name] on: 07/29/2020 07:10 AM   Modules accepted: Orders

## 2020-07-29 NOTE — Telephone Encounter (Signed)
[name] Night - Client TELEPHONE ADVICE RECORD AccessNurse Patient Name: Diana [name] MAN Gender: Female DOB: December 17, 1960 Age: 60 Y 8 M 2 D Return Phone Number: [phone] (Primary) Address: City/ State/ Zip: [place] [address] Client [facility] Night - Client Client Site [facility] - Night Physician [name] - NP Contact Type Call Who Is Calling Patient / Member / Family / Caregiver Call Type Triage / Clinical Relationship To Patient Self Return Phone Number [phone] (Primary) Chief Complaint Cough Reason for Call Symptomatic / Request for Health Information Initial Comment Caller states she had a virtual appointment on Monday with [name], she called her in Prednisone. She called back to get antibiotic and they were supposed to get back with her and she has not heard from anyone. She is coughing up stuff and has a lot of yellow green mucus. Translation No Nurse Assessment Nurse: [place], RN, Marchelle Folks Date/Time (Eastern Time): 07/27/2020 11:16:10 AM Confirm and document reason for call. If symptomatic, describe symptoms. ---caller states she did virtual call on monday and she could get back with her on friday for antibiotic. she called yesterday Does the patient have any new or worsening symptoms? ---Yes Will a triage be completed? ---Yes Related visit to physician within the last 2 weeks? ---Yes Does the PT have any chronic conditions? (i.e. diabetes, asthma, this includes High risk factors for pregnancy, etc.) ---No Is this a behavioral health or substance abuse call? ---No Guidelines Guideline Title Affirmed Question Affirmed Notes Nurse Date/Time (Eastern Time) Cough - Acute Productive Cough with cold symptoms (e.g., runny nose, postnasal drip, throat clearing) [place], RN, Marchelle Folks 07/27/2020 11:16:43 AM Disp. Time Lamount Cohen Time) Disposition Final User 07/27/2020  11:20:13 AM Home Care Yes [place], RN, Marchelle Folks PLEASE NOTE: All timestamps contained within this report are represented as [place] Standard Time. CONFIDENTIALTY NOTICE: This fax transmission is intended only for the addressee. It contains information that is legally privileged, confidential or otherwise protected from use or disclosure. If you are not the intended recipient, you are strictly prohibited from reviewing, disclosing, copying using or disseminating any of this information or taking any action in reliance on or regarding this information. If you have received this fax in error, please notify us immediately by telephone so that we can arrange for its return to [place]. Phone: [phone], Toll-Free: [phone], Fax: [phone] Page: 2 of 2 Call Id: [id number] Caller Disagree/Comply Comply Caller Understands Yes PreDisposition Call Doctor Care Advice Given Per Guideline * COUGH SYRUP WITH DEXTROMETHORPHAN: An over-the-counter cough syrup can help your cough. The most common cough suppressant in over-the-counter cough medicines is dextromethorphan. COUGH MEDICINES: NASAL WASHES FOR A STUFFY NOSE: * Introduction: Saline (salt water) nasal irrigation (nasal wash) is an effective and simple home remedy for treating stuffy nose and sinus congestion. The nose can be irrigated by pouring, spraying, or squirting salt water into the nose and then letting it run back out. CARE ADVICE given per Cough - Acute Productive (Adult) guideline. * You become worse CALL BACK IF: Referrals [facility]

## 2020-07-31 ENCOUNTER — Encounter: Payer: Self-pay | Admitting: Family Medicine

## 2020-07-31 ENCOUNTER — Telehealth (INDEPENDENT_AMBULATORY_CARE_PROVIDER_SITE_OTHER): Payer: BC Managed Care – PPO | Admitting: Family Medicine

## 2020-07-31 VITALS — Temp 98.3°F | Wt 218.0 lb

## 2020-07-31 DIAGNOSIS — J019 Acute sinusitis, unspecified: Secondary | ICD-10-CM

## 2020-07-31 MED ORDER — HYDROCODONE-HOMATROPINE 5-1.5 MG/5ML PO SYRP: 5.0000 mL | ORAL_SOLUTION | ORAL | 0 refills | Status: DC | PRN

## 2020-07-31 MED ORDER — LEVOFLOXACIN 500 MG PO TABS: 500.0000 mg | ORAL_TABLET | Freq: Every day | ORAL | 0 refills | Status: AC

## 2020-07-31 NOTE — Progress Notes (Signed)
Subjective:    Patient ID: Diana [name], female    DOB: 1960/09/06, 60 y.o.   MRN: [id number]  HPI Virtual Visit via Video Note  I connected with the patient on 07/31/20 at  9:30 AM EDT by a video enabled telemedicine application and verified that I am speaking with the correct person using two identifiers.  Location patient: home Location provider:work or home office Persons participating in the virtual visit: patient, provider  I discussed the limitations of evaluation and management by telemedicine and the availability of in person appointments. The patient expressed understanding and agreed to proceed.   HPI: Here for 10 days of sinus congestion, PND, chest congestion and a dry cough. No SOB or fever. No body aches or NVD. She has been taking Zyrtec and Flonase and Theraflu. She had a virtual visit with her PCP, [name], and she gave her a Prednisone taper and Augmentin. After taking this for 3 days, she is no better.    ROS: See pertinent positives and negatives per HPI.  Past Medical History:  Diagnosis Date  . Allergy   . Ear infection    recurrent    Past Surgical History:  Procedure Laterality Date  . ABDOMINAL HYSTERECTOMY  2019   partial  . TONSILLECTOMY    . TUBAL LIGATION     bilateral    Family History  Problem Relation Age of Onset  . Hypertension Mother        alive  . Arthritis Mother   . Lung cancer Father        deceased  . Heart disease Father   . Other Other        1 brother and 1 sister both alive an healthy  . Hypertension Maternal Grandmother      Current Outpatient Medications:  .  Ascorbic Acid 125 MG CHEW, Chew by mouth., Disp: , Rfl:  .  cetirizine (ZYRTEC) 10 MG tablet, Take 10 mg by mouth daily., Disp: , Rfl:  .  Cholecalciferol (VITAMIN D3) 25 MCG (1000 UT) CAPS, Take 5,000 Units by mouth. , Disp: , Rfl:  .  clonazePAM (KLONOPIN) 0.5 MG tablet, TAKE 1 TABLET BY MOUTH DAILY AS NEEDED FOR ANXIETY, Disp: 30 tablet, Rfl: 0 .   hydrochlorothiazide (MICROZIDE) 12.5 MG capsule, TAKE 1 CAPSULE BY MOUTH DAILY. MUST SCHEDULE PHYSICAL EXAM, Disp: 30 capsule, Rfl: 1 .  HYDROcodone-homatropine (HYCODAN) 5-1.5 MG/5ML syrup, Take 5 mLs by mouth every 4 (four) hours as needed for cough., Disp: 240 mL, Rfl: 0 .  hydrocortisone (ANUSOL-HC) 2.5 % rectal cream, PLACE 1 APPLICATION RECTALLY 2 TIMES DAILY., Disp: 30 g, Rfl: 2 .  levofloxacin (LEVAQUIN) 500 MG tablet, Take 1 tablet (500 mg total) by mouth daily for 10 days., Disp: 10 tablet, Rfl: 0 .  omeprazole (PRILOSEC) 40 MG capsule, TAKE 1 CAPSULE BY MOUTH DAILY., Disp: 30 capsule, Rfl: 2 .  Zinc 50 MG CAPS, Take by mouth., Disp: , Rfl:   EXAM:  VITALS per patient if applicable:  GENERAL: alert, oriented, appears well and in no acute distress  HEENT: atraumatic, conjunttiva clear, no obvious abnormalities on inspection of external nose and ears  NECK: normal movements of the head and neck  LUNGS: on inspection no signs of respiratory distress, breathing rate appears normal, no obvious gross SOB, gasping or wheezing  CV: no obvious cyanosis  MS: moves all visible extremities without noticeable abnormality  PSYCH/NEURO: pleasant and cooperative, no obvious depression or anxiety, speech and thought processing grossly  intact  ASSESSMENT AND PLAN: Sinusitis, stop the Augmentin and switch to Levaquin for 10 days. Add Hydromet as needed.  Gershon Crane, MD  Discussed the following assessment and plan:  No diagnosis found.     I discussed the assessment and treatment plan with the patient. The patient was provided an opportunity to ask questions and all were answered. The patient agreed with the plan and demonstrated an understanding of the instructions.   The patient was advised to call back or seek an in-person evaluation if the symptoms worsen or if the condition fails to improve as anticipated.     Review of Systems     Objective:   Physical Exam         Assessment & Plan:

## 2020-08-22 ENCOUNTER — Ambulatory Visit
Admission: RE | Admit: 2020-08-22 | Discharge: 2020-08-22 | Disposition: A | Discharge status: Home / Self Care | Payer: BC Managed Care – PPO | Source: Ambulatory Visit | Attending: Internal Medicine | Admitting: Internal Medicine

## 2020-08-22 ENCOUNTER — Ambulatory Visit: Payer: BC Managed Care – PPO | Admitting: Internal Medicine

## 2020-08-22 ENCOUNTER — Other Ambulatory Visit: Payer: Self-pay

## 2020-08-22 ENCOUNTER — Ambulatory Visit
Admission: RE | Admit: 2020-08-22 | Discharge: 2020-08-22 | Disposition: A | Discharge status: Home / Self Care | Payer: BC Managed Care – PPO | Attending: Internal Medicine | Admitting: Internal Medicine

## 2020-08-22 ENCOUNTER — Encounter: Payer: Self-pay | Admitting: Internal Medicine

## 2020-08-22 VITALS — BP 148/75 | HR 70 | Temp 97.7°F | Resp 18 | Ht 66.7 in | Wt 216.2 lb

## 2020-08-22 DIAGNOSIS — R059 Cough, unspecified: Secondary | ICD-10-CM

## 2020-08-22 DIAGNOSIS — F32A Depression, unspecified: Secondary | ICD-10-CM

## 2020-08-22 DIAGNOSIS — R0982 Postnasal drip: Secondary | ICD-10-CM | POA: Diagnosis not present

## 2020-08-22 DIAGNOSIS — I10 Essential (primary) hypertension: Secondary | ICD-10-CM

## 2020-08-22 DIAGNOSIS — Z6834 Body mass index (BMI) 34.0-34.9, adult: Secondary | ICD-10-CM

## 2020-08-22 DIAGNOSIS — E6609 Other obesity due to excess calories: Secondary | ICD-10-CM

## 2020-08-22 DIAGNOSIS — F419 Anxiety disorder, unspecified: Secondary | ICD-10-CM

## 2020-08-22 DIAGNOSIS — K219 Gastro-esophageal reflux disease without esophagitis: Secondary | ICD-10-CM | POA: Diagnosis not present

## 2020-08-22 MED ORDER — HYDROCHLOROTHIAZIDE 12.5 MG PO CAPS: ORAL_CAPSULE | ORAL | 1 refills | Status: DC

## 2020-08-22 MED ORDER — OMEPRAZOLE 40 MG PO CPDR: 1.0000 | DELAYED_RELEASE_CAPSULE | Freq: Every day | ORAL | 1 refills | Status: DC

## 2020-08-22 MED ORDER — CLONAZEPAM 0.5 MG PO TABS: 0.5000 mg | ORAL_TABLET | Freq: Every day | ORAL | 0 refills | Status: DC | PRN

## 2020-08-22 NOTE — Assessment & Plan Note (Addendum)
HCTZ refilled today Reinforced DASH diet and exercise for weight loss We will monitor

## 2020-08-22 NOTE — Progress Notes (Signed)
Subjective:    Patient ID: Diana [name], female    DOB: 04-17-1961, 60 y.o.   MRN: [id number]  HPI  Patient presents to clinic today with complaint of postnasal drip and cough.  She reports this started about 3 weeks ago.  She denies difficulty swallowing.  The cough is nonproductive.  She denies headache, facial pain or pressure, runny nose, nasal congestion, ear pain or shortness of breath.  She denies fever, chills or body aches.  She was seen for 4 for the same, diagnosed with viral sinusitis and treated with a Prednisone taper.  She called back after a few days of not improving symptoms and was started on Augmentin BID for 10 days.  She did a virtual visit with Dr. Clent Ridges 4/13, started on Levaquin and Hycodan.  She feels like some of her symptoms have improved but the cough still lingers.  She is not currently taking her Zyrtec.  She is taking Omeprazole as prescribed but is needing a refill today.  She also would like refills of Clonazepam, HCTZ. She is due for CSA today.  Review of Systems      Past Medical History:  Diagnosis Date  . Allergy   . Ear infection    recurrent    Current Outpatient Medications  Medication Sig Dispense Refill  . Ascorbic Acid 125 MG CHEW Chew by mouth.    . cetirizine (ZYRTEC) 10 MG tablet Take 10 mg by mouth daily.    . Cholecalciferol (VITAMIN D3) 25 MCG (1000 UT) CAPS Take 5,000 Units by mouth.     . clonazePAM (KLONOPIN) 0.5 MG tablet TAKE 1 TABLET BY MOUTH DAILY AS NEEDED FOR ANXIETY 30 tablet 0  . hydrochlorothiazide (MICROZIDE) 12.5 MG capsule TAKE 1 CAPSULE BY MOUTH DAILY. MUST SCHEDULE PHYSICAL EXAM 30 capsule 1  . HYDROcodone-homatropine (HYCODAN) 5-1.5 MG/5ML syrup Take 5 mLs by mouth every 4 (four) hours as needed for cough. 240 mL 0  . hydrocortisone (ANUSOL-HC) 2.5 % rectal cream PLACE 1 APPLICATION RECTALLY 2 TIMES DAILY. 30 g 2  . omeprazole (PRILOSEC) 40 MG capsule TAKE 1 CAPSULE BY MOUTH DAILY. 30 capsule 2  . Zinc 50 MG CAPS Take  by mouth.     No current facility-administered medications for this visit.    No Known Allergies  Family History  Problem Relation Age of Onset  . Hypertension Mother        alive  . Arthritis Mother   . Lung cancer Father        deceased  . Heart disease Father   . Other Other        1 brother and 1 sister both alive an healthy  . Hypertension Maternal Grandmother     Social History   Socioeconomic History  . Marital status: Widowed    Spouse name: Not on file  . Number of children: 3  . Years of education: Not on file  . Highest education level: Not on file  Occupational History  . Occupation: Nutritional therapist: [employer]  Tobacco Use  . Smoking status: Former Smoker    Types: Cigarettes    Quit date: 04/20/1985    Years since quitting: 35.3  . Smokeless tobacco: Never Used  . Tobacco comment: remote tobacco use   Substance and Sexual Activity  . Alcohol use: No    Alcohol/week: 0.0 standard drinks  . Drug use: No  . Sexual activity: Never  Other Topics Concern  . Not  on file  Social History Narrative  . Not on file   Social Determinants of Health   Financial Resource Strain: Not on file  Food Insecurity: Not on file  Transportation Needs: Not on file  Physical Activity: Not on file  Stress: Not on file  Social Connections: Not on file  Intimate Partner Violence: Not on file     Constitutional: Denies fever, malaise, fatigue, headache or abrupt weight changes.  HEENT: Patient reports postnasal drip.  Denies eye pain, eye redness, ear pain, ringing in the ears, wax buildup, runny nose, nasal congestion, bloody nose, or sore throat. Respiratory: Patient reports cough.  Denies difficulty breathing, shortness of breath, or sputum production.   Cardiovascular: Denies chest pain, chest tightness, palpitations or swelling in the hands or feet.  Gastrointestinal: Denies abdominal pain, bloating, constipation, diarrhea or blood in the stool.   Neurological: Denies dizziness, difficulty with memory, difficulty with speech or problems with balance and coordination.  Psych: Patient has a history of anxiety and depression.  Denies SI/HI.  No other specific complaints in a complete review of systems (except as listed in HPI above).  Objective:   Physical Exam   BP (!) 148/75 (BP Location: Right Arm, Patient Position: Sitting, Cuff Size: Large)   Pulse 70   Temp 97.7 F (36.5 C) (Temporal)   Resp 18   Ht 5' 6.7" (1.694 m)   Wt 216 lb 3.2 oz (98.1 kg)   LMP 01/20/2016   SpO2 99%   BMI 34.17 kg/m   Wt Readings from Last 3 Encounters:  07/31/20 218 lb (98.9 kg)  07/11/20 218 lb (98.9 kg)  02/09/20 209 lb (94.8 kg)    General: Appears her stated age, obese, in NAD. HEENT: Head: normal shape and size; Eyes: sclera white and EOMs intact; Throat/Mouth: Teeth present, mucosa pink and moist, + PND, no exudate, lesions or ulcerations noted.  Neck: No adenopathy noted. Cardiovascular: Normal rate and rhythm. S1,S2 noted.  No murmur, rubs or gallops noted.  Pulmonary/Chest: Normal effort and clear but diminished breath sounds.. No respiratory distress. No wheezes, rales or ronchi noted.  Musculoskeletal: Normal range of motion. No signs of joint swelling. No difficulty with gait.  Neurological: Alert and oriented.  Psychiatric: Mood and affect normal. Behavior is normal. Judgment and thought content normal.    BMET    Component Value Date/Time   NA 140 09/19/2019 1554   K 3.7 09/19/2019 1554   CL 101 09/19/2019 1554   CO2 33 (H) 09/19/2019 1554   GLUCOSE 106 (H) 09/19/2019 1554   BUN 15 09/19/2019 1554   CREATININE 0.66 09/19/2019 1554   CALCIUM 9.7 09/19/2019 1554   GFRNONAA >60 07/27/2017 2153   GFRAA >60 07/27/2017 2153    Lipid Panel     Component Value Date/Time   CHOL 193 02/09/2019 1245   TRIG 105.0 02/09/2019 1245   HDL 18.00 (L) 02/09/2019 1245   CHOLHDL 11 02/09/2019 1245   VLDL 21.0 02/09/2019 1245    LDLCALC 154 (H) 02/09/2019 1245    CBC    Component Value Date/Time   WBC 4.7 09/19/2019 1554   RBC 4.59 09/19/2019 1554   HGB 13.9 09/19/2019 1554   HCT 41.1 09/19/2019 1554   PLT 222.0 09/19/2019 1554   MCV 89.5 09/19/2019 1554   MCH 29.7 07/27/2017 1023   MCHC 33.8 09/19/2019 1554   RDW 14.4 09/19/2019 1554   LYMPHSABS 1.1 07/28/2016 1232   MONOABS 0.4 07/28/2016 1232   EOSABS 0.0 07/28/2016  1232   BASOSABS 0.0 07/28/2016 1232    Hgb A1C Lab Results  Component Value Date   HGBA1C 5.9 02/09/2019          Assessment & Plan:   PND, Cough:  Given diminished breath sounds we will check chest x-ray today Advised her to start taking her Zyrtec daily for the next 10 days She has been on multiple antibiotics and Prednisone, no indication to repeat this today  We will follow-up after chest x-ray, RTC in 6 months for your annual exam. [name], NP This visit occurred during the SARS-CoV-2 public health emergency.  Safety protocols were in place, including screening questions prior to the visit, additional usage of staff PPE, and extensive cleaning of exam room while observing appropriate contact time as indicated for disinfecting solutions.

## 2020-08-22 NOTE — Assessment & Plan Note (Signed)
Omeprazole refilled today Avoid foods that trigger reflux Encouraged weight loss as this can help reduce reflux symptoms

## 2020-08-22 NOTE — Assessment & Plan Note (Signed)
CSA today Clonazepam refilled

## 2020-08-22 NOTE — Patient Instructions (Signed)

## 2020-09-30 ENCOUNTER — Telehealth: Payer: Self-pay | Admitting: Internal Medicine

## 2020-09-30 DIAGNOSIS — K644 Residual hemorrhoidal skin tags: Secondary | ICD-10-CM

## 2020-09-30 NOTE — Telephone Encounter (Signed)
   Notes to clinic: medication requested is not on current list  Review for refill   Requested Prescriptions  Pending Prescriptions Disp Refills   hydrocortisone (ANUSOL-HC) 2.5 % rectal cream [Pharmacy Med Name: HYDROCORTISONE 2.5% CREAM 2.5 Cream] 30 g 2    Sig: PLACE 1 APPLICATION RECTALLY 2 TIMES DAILY.      There is no refill protocol information for this order

## 2020-10-15 NOTE — Telephone Encounter (Signed)
Pt called about her refill request for    hydrocortisone (ANUSOL-HC) 2.5 % rectal cream [Pharmacy Med Name: HYDROCORTISONE 2.5% CREAM 2.5 Cream]  / pt stated she was advise it was denied and that the pt needed to contact her PCP/ please advise asap

## 2020-10-15 NOTE — Telephone Encounter (Signed)
Does it need a PA? If denied, will have to use Hydrocortisone suppositories OTC

## 2020-10-18 ENCOUNTER — Other Ambulatory Visit: Payer: Self-pay

## 2020-10-18 ENCOUNTER — Emergency Department (HOSPITAL_BASED_OUTPATIENT_CLINIC_OR_DEPARTMENT_OTHER)
Admission: EM | Admit: 2020-10-18 | Discharge: 2020-10-19 | Disposition: A | Discharge status: Home / Self Care | Payer: BC Managed Care – PPO | Attending: Emergency Medicine | Admitting: Emergency Medicine

## 2020-10-18 DIAGNOSIS — W57XXXA Bitten or stung by nonvenomous insect and other nonvenomous arthropods, initial encounter: Secondary | ICD-10-CM | POA: Insufficient documentation

## 2020-10-18 DIAGNOSIS — Z79899 Other long term (current) drug therapy: Secondary | ICD-10-CM | POA: Diagnosis not present

## 2020-10-18 DIAGNOSIS — I10 Essential (primary) hypertension: Secondary | ICD-10-CM | POA: Insufficient documentation

## 2020-10-18 DIAGNOSIS — S80862A Insect bite (nonvenomous), left lower leg, initial encounter: Secondary | ICD-10-CM | POA: Diagnosis not present

## 2020-10-18 DIAGNOSIS — Z87891 Personal history of nicotine dependence: Secondary | ICD-10-CM | POA: Insufficient documentation

## 2020-10-18 MED ORDER — CEPHALEXIN 500 MG PO CAPS: 500.0000 mg | ORAL_CAPSULE | Freq: Three times a day (TID) | ORAL | 0 refills | Status: DC

## 2020-10-18 MED ORDER — SODIUM CHLORIDE 0.9 % IV BOLUS: 1000.0000 mL | Freq: Once | INTRAVENOUS | Status: DC

## 2020-10-18 MED ORDER — CEPHALEXIN 500 MG PO CAPS: 500.0000 mg | ORAL_CAPSULE | Freq: Three times a day (TID) | ORAL | 0 refills | Status: AC

## 2020-10-18 NOTE — Discharge Instructions (Addendum)
Call your primary care doctor or specialist as discussed in the next 2-3 days.   Return immediately back to the ER if:  Your symptoms worsen within the next 12-24 hours. You develop new symptoms such as new fevers, persistent vomiting, new pain, shortness of breath, or new weakness or numbness, or if you have any other concerns.  

## 2020-10-18 NOTE — ED Provider Notes (Signed)
[id number] EMERGENCY DEPT Provider Note   CSN: [id number] Arrival date & time: 10/18/20  1854     History Chief Complaint  Patient presents with   Insect Bite    Diana [name] is a 60 y.o. female.  Patient presents complaint of leg bug bite to left lower extremity.  She noticed that earlier today she had been scratching it and had a small break of the skin.  She noticed redness surrounding this area.  She denies any fevers or cough no vomiting or diarrhea.      Past Medical History:  Diagnosis Date   Allergy    Ear infection    recurrent    Patient Active Problem List   Diagnosis Date Noted   Anxiety and depression 06/08/2019   Chronic constipation 04/26/2019   Intermittent low back pain 10/11/2018   Chronic hepatitis B virus infection (HCC) 11/10/2017   GERD (gastroesophageal reflux disease) 06/09/2016   Essential hypertension 03/07/2015    Past Surgical History:  Procedure Laterality Date   ABDOMINAL HYSTERECTOMY  2019   partial   TONSILLECTOMY     TUBAL LIGATION     bilateral     OB History   No obstetric history on file.     Family History  Problem Relation Age of Onset   Hypertension Mother        alive   Arthritis Mother    Lung cancer Father        deceased   Heart disease Father    Other Other        1 brother and 1 sister both alive an healthy   Hypertension Maternal Grandmother     Social History   Tobacco Use   Smoking status: Former    Pack years: 0.00    Types: Cigarettes    Quit date: 04/20/1985    Years since quitting: 35.5   Smokeless tobacco: Never   Tobacco comments:    remote tobacco use   Vaping Use   Vaping Use: Never used  Substance Use Topics   Alcohol use: No    Alcohol/week: 0.0 standard drinks   Drug use: No    Home Medications Prior to Admission medications   Medication Sig Start Date End Date Taking? Authorizing Provider  cephALEXin (KEFLEX) 500 MG capsule Take 1 capsule (500 mg total) by  mouth 3 (three) times daily for 5 days. 10/18/20 10/23/20 Yes Cheryll Cockayne, MD  acyclovir (ZOVIRAX) 800 MG tablet Take 4,000 mg by mouth daily. 08/17/20   [provider]  Ascorbic Acid 125 MG CHEW Chew by mouth.    [provider]  cetirizine (ZYRTEC) 10 MG tablet Take 10 mg by mouth daily. Patient not taking: Reported on 08/22/2020    [provider]  Cholecalciferol (VITAMIN D3) 25 MCG (1000 UT) CAPS Take 5,000 Units by mouth.     [provider]  clonazePAM (KLONOPIN) 0.5 MG tablet Take 1 tablet (0.5 mg total) by mouth daily as needed. for anxiety 08/22/20   [name], NP  hydrochlorothiazide (MICROZIDE) 12.5 MG capsule TAKE 1 CAPSULE BY MOUTH DAILY. MUST SCHEDULE PHYSICAL EXAM 08/22/20   [name], NP  omeprazole (PRILOSEC) 40 MG capsule Take 1 capsule (40 mg total) by mouth daily. 08/22/20   [name], NP  Zinc 50 MG CAPS Take by mouth.    [provider]    Allergies    Patient has no known allergies.  Review of Systems  Review of Systems  Constitutional:  Negative for fever.  HENT:  Negative for ear pain.   Eyes:  Negative for pain.  Respiratory:  Negative for cough.   Cardiovascular:  Negative for chest pain.  Gastrointestinal:  Negative for abdominal pain.  Genitourinary:  Negative for flank pain.  Musculoskeletal:  Negative for back pain.  Skin:  Negative for rash.  Neurological:  Negative for headaches.   Physical Exam Updated Vital Signs BP (!) 169/87 (BP Location: Right Arm)   Pulse 65   Temp 98.2 F (36.8 C) (Oral)   Resp 16   Ht 5\' 7"  (1.702 m)   Wt 93.9 kg   LMP 01/20/2016   SpO2 97%   BMI 32.42 kg/m   Physical Exam Constitutional:      General: She is not in acute distress.    Appearance: Normal appearance.  HENT:     Head: Normocephalic.     Nose: Nose normal.  Eyes:     Extraocular Movements: Extraocular movements intact.  Cardiovascular:     Rate and Rhythm: Normal rate.  Pulmonary:      Effort: Pulmonary effort is normal.  Musculoskeletal:        General: Normal range of motion.     Cervical back: Normal range of motion.  Skin:    Comments: Left lower extremity proximal to the medial malleoli patient has a small skin break consistent with bug bite with surrounding edema and swelling approximately 4 x 3 cm.  Neurological:     General: No focal deficit present.     Mental Status: She is alert. Mental status is at baseline.    ED Results / Procedures / Treatments   Labs (all labs ordered are listed, but only abnormal results are displayed) Labs Reviewed - No data to display  EKG None  Radiology No results found.  Procedures Procedures   Medications Ordered in ED Medications - No data to display  ED Course  I have reviewed the triage vital signs and the nursing notes.  Pertinent labs & imaging results that were available during my care of the patient were reviewed by me and considered in my medical decision making (see chart for details).    MDM Rules/Calculators/A&P                          Patient presents with bug bite to left lower extremity.  I doubt acute cellulitis but will treat empirically.  Advised return if she has fevers or worsening symptoms or increased redness.  Final Clinical Impression(s) / ED Diagnoses Final diagnoses:  Insect bite, unspecified site, initial encounter    Rx / DC Orders ED Discharge Orders          Ordered    cephALEXin (KEFLEX) 500 MG capsule  3 times daily        10/18/20 2318             12/19/20, MD 10/18/20 2318

## 2020-10-18 NOTE — ED Triage Notes (Signed)
Pt to ED from home with c/o insect bite of unknown species that Pt received at work yesterday. Pt states that it is very itchy and when RN applies pressure to wound she cannot feel directly over the bite. Pt states that the redness and swelling around the area of the bite has doubled in size in redness.

## 2020-10-22 NOTE — Telephone Encounter (Signed)
Patient called in to inquire of [name] to please put in an Rx for her to get the Hydrocortisone cream say that she have never used the suppository and only use the cream when needed. Please call Patient at Ph#  [phone]

## 2020-10-22 NOTE — Telephone Encounter (Signed)
The rx was denied by her insurance company. She will have to pay cash price or use Preperation H OTC

## 2020-10-23 ENCOUNTER — Other Ambulatory Visit: Payer: Self-pay

## 2020-10-23 MED ORDER — HYDROCORTISONE (PERIANAL) 2.5 % EX CREA: 1.0000 "application " | TOPICAL_CREAM | Freq: Two times a day (BID) | CUTANEOUS | 0 refills | Status: DC

## 2020-11-11 ENCOUNTER — Telehealth: Payer: Self-pay

## 2020-11-11 NOTE — Telephone Encounter (Signed)
Will discuss at upcoming appt.

## 2020-11-11 NOTE — Telephone Encounter (Signed)
Copied from CRM [id number]. Topic: General - Other >> Nov 08, 2020  8:09 AM Jaquita Rector A wrote: Reason for CRM: Patient called in to inquire of [name] that she is having some issues with her eye lid was seen at the eye Dr several times but does not want to go back but say that her left eye lid was very itchy so she scratched it now its raw irritated and a little painful. Asking for a call back today please no appointment available until 11/12/20   Ph# [phone]

## 2020-11-12 ENCOUNTER — Other Ambulatory Visit: Payer: Self-pay

## 2020-11-12 ENCOUNTER — Encounter: Payer: Self-pay | Admitting: Internal Medicine

## 2020-11-12 ENCOUNTER — Ambulatory Visit: Payer: BC Managed Care – PPO | Admitting: Internal Medicine

## 2020-11-12 VITALS — BP 138/69 | HR 72 | Temp 97.5°F | Resp 17 | Ht 67.0 in | Wt 213.8 lb

## 2020-11-12 DIAGNOSIS — E66812 Obesity, class 2: Secondary | ICD-10-CM | POA: Insufficient documentation

## 2020-11-12 DIAGNOSIS — H01136 Eczematous dermatitis of left eye, unspecified eyelid: Secondary | ICD-10-CM | POA: Diagnosis not present

## 2020-11-12 DIAGNOSIS — E6609 Other obesity due to excess calories: Secondary | ICD-10-CM | POA: Insufficient documentation

## 2020-11-12 DIAGNOSIS — Z6834 Body mass index (BMI) 34.0-34.9, adult: Secondary | ICD-10-CM | POA: Insufficient documentation

## 2020-11-12 MED ORDER — TRIAMCINOLONE ACETONIDE 0.1 % EX CREA: 1.0000 "application " | TOPICAL_CREAM | Freq: Two times a day (BID) | CUTANEOUS | 0 refills | Status: DC

## 2020-11-12 NOTE — Assessment & Plan Note (Signed)
Encouraged diet and exercise for weight loss ?

## 2020-11-12 NOTE — Progress Notes (Signed)
Subjective:    Patient ID: Diana [name], female    DOB: 10-04-1960, 60 y.o.   MRN: [id number]  HPI  Patient presents the clinic today with complaint of irritation of her left eyelid.  She noticed this 4 months ago.  She reports it initially started on her right eyelid and has now spread to her left eyelid.  She reports the area is itchy.  She reports she is scratched to the point where it is raw and painful.  She has seen her eye doctor multiple times for the same.  She has been given oral and topical antibiotics, antivirals which improve her symptoms but reports they do not resolve her symptoms.  She is not interested in going back to her eye doctor for this at this time.  Review of Systems     Past Medical History:  Diagnosis Date   Allergy    Ear infection    recurrent    Current Outpatient Medications  Medication Sig Dispense Refill   acyclovir (ZOVIRAX) 800 MG tablet Take 4,000 mg by mouth daily.     Ascorbic Acid 125 MG CHEW Chew by mouth.     cetirizine (ZYRTEC) 10 MG tablet Take 10 mg by mouth daily. (Patient not taking: Reported on 08/22/2020)     Cholecalciferol (VITAMIN D3) 25 MCG (1000 UT) CAPS Take 5,000 Units by mouth.      clonazePAM (KLONOPIN) 0.5 MG tablet Take 1 tablet (0.5 mg total) by mouth daily as needed. for anxiety 30 tablet 0   hydrochlorothiazide (MICROZIDE) 12.5 MG capsule TAKE 1 CAPSULE BY MOUTH DAILY. MUST SCHEDULE PHYSICAL EXAM 90 capsule 1   hydrocortisone (ANUSOL-HC) 2.5 % rectal cream Place 1 application rectally 2 (two) times daily. 30 g 0   omeprazole (PRILOSEC) 40 MG capsule Take 1 capsule (40 mg total) by mouth daily. 90 capsule 1   Zinc 50 MG CAPS Take by mouth.     No current facility-administered medications for this visit.    No Known Allergies  Family History  Problem Relation Age of Onset   Hypertension Mother        alive   Arthritis Mother    Lung cancer Father        deceased   Heart disease Father    Other Other        1  brother and 1 sister both alive an healthy   Hypertension Maternal Grandmother     Social History   Socioeconomic History   Marital status: Widowed    Spouse name: Not on file   Number of children: 3   Years of education: Not on file   Highest education level: Not on file  Occupational History   Occupation: customer service    Employer: [employer]  Tobacco Use   Smoking status: Former    Types: Cigarettes    Quit date: 04/20/1985    Years since quitting: 35.5   Smokeless tobacco: Never   Tobacco comments:    remote tobacco use   Vaping Use   Vaping Use: Never used  Substance and Sexual Activity   Alcohol use: No    Alcohol/week: 0.0 standard drinks   Drug use: No   Sexual activity: Never  Other Topics Concern   Not on file  Social History Narrative   Not on file   Social Determinants of Health   Financial Resource Strain: Not on file  Food Insecurity: Not on file  Transportation Needs: Not on file  Physical Activity: Not on file  Stress: Not on file  Social Connections: Not on file  Intimate Partner Violence: Not on file     Constitutional: Denies fever, malaise, fatigue, headache or abrupt weight changes.  HEENT: Denies eye pain, eye redness, ear pain, ringing in the ears, wax buildup, runny nose, nasal congestion, bloody nose, or sore throat. Respiratory: Denies difficulty breathing, shortness of breath, cough or sputum production.   Cardiovascular: Denies chest pain, chest tightness, palpitations or swelling in the hands or feet.  Skin: Patient reports irritation to left upper eyelid.  Denies rashes, lesions or ulcercations.   No other specific complaints in a complete review of systems (except as listed in HPI above).  Objective:   Physical Exam   BP 138/69 (BP Location: Right Arm, Patient Position: Sitting, Cuff Size: Large)   Pulse 72   Temp (!) 97.5 F (36.4 C) (Temporal)   Resp 17   Ht 5\' 7"  (1.702 m)   Wt 213 lb 12.8 oz (97 kg)   LMP  01/20/2016   SpO2 100%   BMI 33.49 kg/m   Wt Readings from Last 3 Encounters:  10/18/20 207 lb (93.9 kg)  08/22/20 216 lb 3.2 oz (98.1 kg)  07/31/20 218 lb (98.9 kg)    General: Appears her stated age, obese, in NAD. Skin: Warm, dry and intact.  Eczema noted of left upper eyelid. HEENT: Head: normal shape and size; Eyes: sclera white, no icterus, conjunctiva pink, PERRLA and EOMs intact;  Cardiovascular: Normal rate. Pulmonary/Chest: Normal effort. Neurological: Alert and oriented.    BMET    Component Value Date/Time   NA 140 09/19/2019 1554   K 3.7 09/19/2019 1554   CL 101 09/19/2019 1554   CO2 33 (H) 09/19/2019 1554   GLUCOSE 106 (H) 09/19/2019 1554   BUN 15 09/19/2019 1554   CREATININE 0.66 09/19/2019 1554   CALCIUM 9.7 09/19/2019 1554   GFRNONAA >60 07/27/2017 2153   GFRAA >60 07/27/2017 2153    Lipid Panel     Component Value Date/Time   CHOL 193 02/09/2019 1245   TRIG 105.0 02/09/2019 1245   HDL 18.00 (L) 02/09/2019 1245   CHOLHDL 11 02/09/2019 1245   VLDL 21.0 02/09/2019 1245   LDLCALC 154 (H) 02/09/2019 1245    CBC    Component Value Date/Time   WBC 4.7 09/19/2019 1554   RBC 4.59 09/19/2019 1554   HGB 13.9 09/19/2019 1554   HCT 41.1 09/19/2019 1554   PLT 222.0 09/19/2019 1554   MCV 89.5 09/19/2019 1554   MCH 29.7 07/27/2017 1023   MCHC 33.8 09/19/2019 1554   RDW 14.4 09/19/2019 1554   LYMPHSABS 1.1 07/28/2016 1232   MONOABS 0.4 07/28/2016 1232   EOSABS 0.0 07/28/2016 1232   BASOSABS 0.0 07/28/2016 1232    Hgb A1C Lab Results  Component Value Date   HGBA1C 5.9 02/09/2019           Assessment & Plan:  Eczema of Left Upper Eyelid:  Rx for Triamcinolone cream 0.1% twice daily as needed, avoid getting this in your eye Stop all other topical treatments to eyelid at this time  RTC in 3 months for your annual exam 02/11/2019, NP This visit occurred during the SARS-CoV-2 public health emergency.  Safety protocols were in place,  including screening questions prior to the visit, additional usage of staff PPE, and extensive cleaning of exam room while observing appropriate contact time as indicated for disinfecting solutions.

## 2020-11-12 NOTE — Patient Instructions (Signed)
Eczema Eczema refers to a group of skin conditions that cause skin to become rough and inflamed. Each type of eczema has different triggers, symptoms, and treatments. Eczema of any type is usually itchy. Symptoms range from mild to severe. Eczema is not spread from person to person (is not contagious). It can appear on different parts of the body at different times. One person's eczema may look different from another person's eczema. What are the causes? The exact cause of this condition is not known. However, exposure to certain environmental factors, irritants, and allergens can make the condition worse. What are the signs or symptoms? Symptoms of this condition depend on the type of eczema you have. The types include: Contact dermatitis. There are two kinds: Irritant contact dermatitis. This happens when something irritates the skin and causes a rash. Allergic contact dermatitis. This happens when your skin comes in contact with something you are allergic to (allergens). This can include poison ivy, chemicals, or medicines that were applied to your skin. Atopic dermatitis. This is a long-term (chronic) skin disease that keeps coming back (recurring). It is the most common type of eczema. Usual symptoms are a red rash and itchy, dry, scaly skin. It usually starts showing signs in infancy and can last through adulthood. Dyshidrotic eczema. This is a form of eczema on the hands and feet. It shows up as very itchy, fluid-filled blisters. It can affect people of any age but is more common before age 40. Hand eczema. This causes very itchy areas of skin on the palms and sides of the hands and fingers. This type of eczema is common in industrial jobs where you may be exposed to different types of irritants. Lichen simplex chronicus. This type of eczema occurs when a person constantly scratches one area of the body. Repeated scratching of the area leads to thickened skin (lichenification). This condition can  accompany other types of eczema. It is more common in adults but may also be seen in children. Nummular eczema. This is a common type of eczema that most often affects the lower legs and the backs of the hands. It typically causes an itchy, red, circular, crusty lesion (plaque). Scratching may become a habit and can cause bleeding. Nummular eczema occurs most often in middle-aged or older people. Seborrheic dermatitis. This is a common skin disease that mainly affects the scalp. It may also affect other oily areas of the body, such as the face, sides of the nose, eyebrows, ears, eyelids, and chest. It is marked by small scaling and redness of the skin (erythema). This can affect people of all ages. In infants, this condition is called cradle cap. Stasis dermatitis. This is a common skin disease that can cause itching, scaling, and hyperpigmentation, usually on the legs and feet. It occurs most often in people who have a condition that prevents blood from being pumped through the veins in the legs (chronic venous insufficiency). Stasis dermatitis is a chronic condition that needs long-term management. How is this diagnosed? This condition may be diagnosed based on: A physical exam of your skin. Your medical history. Skin patch tests. These tests involve using patches that contain possible allergens and placing them on your back. Your health care provider will check in a few days to see if an allergic reaction occurred. How is this treated? Treatment for eczema is based on the type of eczema you have. You may be given hydrocortisone steroid medicine or antihistamines. These can relieve itching quickly and help reduce inflammation.   These may be prescribed or purchased over the counter, depending on the strength that is needed. Follow these instructions at home: Take or apply over-the-counter and prescription medicines only as told by your health care provider. Use creams or ointments to moisturize your  skin. Do not use lotions. Learn what triggers or irritates your symptoms so you can avoid these things. Treat symptom flare-ups quickly. Do not scratch your skin. This can make your rash worse. Keep all follow-up visits. This is important. Where to find more information American Academy of Dermatology: aad.org National Eczema Association: nationaleczema.org The Society for Pediatric Dermatology: pedsderm.net Contact a health care provider if: You have severe itching, even with treatment. You scratch your skin regularly until it bleeds. Your rash looks different than usual. Your skin is painful, swollen, or more red than usual. You have a fever. Summary Eczema refers to a group of skin conditions that cause skin to become rough and inflamed. Each type has different triggers. Eczema of any type causes itching that may range from mild to severe. Treatment varies based on the type of eczema you have. Hydrocortisone steroid medicine or antihistamines can help with itching and inflammation. Protecting your skin is the best way to prevent eczema. Use creams or ointments to moisturize your skin. Avoid triggers and irritants. Treat flare-ups quickly. This information is not intended to replace advice given to you by your health care provider. Make sure you discuss any questions you have with your health care provider. Document Revised: 01/15/2020 Document Reviewed: 01/15/2020 Elsevier Patient Education  2022 Elsevier Inc.  

## 2020-12-26 ENCOUNTER — Other Ambulatory Visit: Payer: Self-pay | Admitting: Internal Medicine

## 2020-12-26 DIAGNOSIS — F32A Depression, unspecified: Secondary | ICD-10-CM

## 2020-12-26 DIAGNOSIS — F419 Anxiety disorder, unspecified: Secondary | ICD-10-CM

## 2020-12-26 NOTE — Telephone Encounter (Signed)
Requested medications are due for refill today yes  Requested medications are on the active medication list yes  Last refill 08/22/20  Last visit 08/2020  Future visit scheduled 02/2021  Notes to clinic Not Delegated.

## 2020-12-30 ENCOUNTER — Ambulatory Visit: Payer: Self-pay

## 2020-12-30 NOTE — Telephone Encounter (Signed)
Pt. Reports she noticed a dark purple discoloration to left calf area on the side. States it is not a bruise. States the size of a fifty cent piece. No pain or swelling. No known injury. Would like to be seen this week. No answer on FC line. Please advise pt.

## 2020-12-30 NOTE — Telephone Encounter (Signed)
Message from Leafy Ro sent at 12/30/2020 10:25 AM EDT  Pt is calling and she notice on Friday night 12-27-2020 on the back of her right leg and there is area that is turning purple. Pt would like an appt today. Rene Kocher is not in office today. Please advise     Call History   Type Contact Phone/Fax User  12/30/2020 10:23 AM EDT Phone (Incoming) Nishika, [place] (Self) [phone] Rexene Edison) Leafy Ro   Reason for Disposition  [1] Not caused by an injury AND [2] < 5 unexplained bruises  Answer Assessment - Initial Assessment Questions 1. APPEARANCE of BRUISE: "Describe the bruise."      Purple 2. SIZE: "How large is the bruise?"      Fifty cent piece 3. NUMBER: "How many bruises are there?"      1 4. LOCATION: "Where is the bruise located?"      Left calf 5. ONSET: "How long ago did the bruise occur?"      Friday 6. CAUSE: "Tell me how it happened."     Unsure 7. MEDICAL HISTORY: "Do you have any medical problems that can cause easy bruising or bleeding?" (e.g., leukemia, liver disease, recent chemotherapy)     No 8. MEDICATIONS: "Do you take any medications which thin the blood such as: aspirin, heparin, ibuprofen (NSAIDS), Plavix, or Coumadin?"     No 9. OTHER SYMPTOMS: "Do you have any other symptoms?"  (e.g., weakness, dizziness, pain, fever, nosebleed, blood in urine/stool)     No 10. PREGNANCY: "Is there any chance you are pregnant?" "When was your last menstrual period?"       No  Protocols used: Bruises-A-AH

## 2021-01-01 NOTE — Telephone Encounter (Signed)
Will discuss at upcoming appointment

## 2021-01-02 ENCOUNTER — Ambulatory Visit: Payer: BC Managed Care – PPO | Admitting: Internal Medicine

## 2021-01-02 ENCOUNTER — Encounter: Payer: Self-pay | Admitting: Internal Medicine

## 2021-01-02 ENCOUNTER — Other Ambulatory Visit: Payer: Self-pay

## 2021-01-02 ENCOUNTER — Other Ambulatory Visit (HOSPITAL_COMMUNITY)
Admission: RE | Admit: 2021-01-02 | Discharge: 2021-01-02 | Disposition: A | Discharge status: Home / Self Care | Payer: BC Managed Care – PPO | Source: Ambulatory Visit | Attending: Internal Medicine | Admitting: Internal Medicine

## 2021-01-02 VITALS — BP 148/72 | HR 69 | Temp 97.5°F | Resp 16 | Ht 67.0 in | Wt 214.0 lb

## 2021-01-02 DIAGNOSIS — S8012XA Contusion of left lower leg, initial encounter: Secondary | ICD-10-CM | POA: Diagnosis not present

## 2021-01-02 DIAGNOSIS — Z113 Encounter for screening for infections with a predominantly sexual mode of transmission: Secondary | ICD-10-CM | POA: Insufficient documentation

## 2021-01-02 DIAGNOSIS — R7303 Prediabetes: Secondary | ICD-10-CM | POA: Insufficient documentation

## 2021-01-02 NOTE — Progress Notes (Signed)
Subjective:    Patient ID: Diana [name], female    DOB: 06-17-1960, 60 y.o.   MRN: [id number]  HPI  Patient presents to clinic today with complaint of discoloration to the back of her left calf.  She noticed this 1 week ago.  The area has not gotten bigger in size.  It is not tender, warm.  She has not noticed any drainage from the area.  She denies any injury to the area.  She has not tried anything OTC for this.  She also would like STD screening today.  She has not had any symptoms but has had a new sexual partner and she recently found out was unfaithful.   Review of Systems     Past Medical History:  Diagnosis Date   Allergy    Ear infection    recurrent    Current Outpatient Medications  Medication Sig Dispense Refill   Ascorbic Acid 125 MG CHEW Chew by mouth.     cetirizine (ZYRTEC) 10 MG tablet Take 10 mg by mouth daily.     Cholecalciferol (VITAMIN D3) 25 MCG (1000 UT) CAPS Take 5,000 Units by mouth.      clonazePAM (KLONOPIN) 0.5 MG tablet TAKE 1 TABLET BY MOUTH DAILY AS NEEDED FOR ANXIETY 30 tablet 0   hydrochlorothiazide (MICROZIDE) 12.5 MG capsule TAKE 1 CAPSULE BY MOUTH DAILY. MUST SCHEDULE PHYSICAL EXAM 90 capsule 1   hydrocortisone (ANUSOL-HC) 2.5 % rectal cream Place 1 application rectally 2 (two) times daily. 30 g 0   omeprazole (PRILOSEC) 40 MG capsule Take 1 capsule (40 mg total) by mouth daily. 90 capsule 1   triamcinolone cream (KENALOG) 0.1 % Apply 1 application topically 2 (two) times daily. 30 g 0   Zinc 50 MG CAPS Take by mouth.     No current facility-administered medications for this visit.    No Known Allergies  Family History  Problem Relation Age of Onset   Hypertension Mother        alive   Arthritis Mother    Lung cancer Father        deceased   Heart disease Father    Other Other        1 brother and 1 sister both alive an healthy   Hypertension Maternal Grandmother     Social History   Socioeconomic History   Marital status:  Widowed    Spouse name: Not on file   Number of children: 3   Years of education: Not on file   Highest education level: Not on file  Occupational History   Occupation: customer service    Employer: [employer]  Tobacco Use   Smoking status: Former    Types: Cigarettes    Quit date: 04/20/1985    Years since quitting: 35.7   Smokeless tobacco: Never   Tobacco comments:    remote tobacco use   Vaping Use   Vaping Use: Never used  Substance and Sexual Activity   Alcohol use: No    Alcohol/week: 0.0 standard drinks   Drug use: No   Sexual activity: Never  Other Topics Concern   Not on file  Social History Narrative   Not on file   Social Determinants of Health   Financial Resource Strain: Not on file  Food Insecurity: Not on file  Transportation Needs: Not on file  Physical Activity: Not on file  Stress: Not on file  Social Connections: Not on file  Intimate Partner Violence: Not on  file     Constitutional: Denies fever, malaise, fatigue, headache or abrupt weight changes.  Respiratory: Denies difficulty breathing, shortness of breath, cough or sputum production.   Cardiovascular: Denies chest pain, chest tightness, palpitations or swelling in the hands or feet.  Gastrointestinal: Denies abdominal pain, bloating, constipation, diarrhea or blood in the stool.  GU: Denies urgency, frequency, pain with urination, burning sensation, blood in urine, odor or discharge. Skin: Patient reports discoloration to her calf.  Denies rashes, lesions or ulcercations.   No other specific complaints in a complete review of systems (except as listed in HPI above).  Objective:   Physical Exam  BP (!) 148/72 (BP Location: Right Arm, Patient Position: Sitting, Cuff Size: Large)   Pulse 69   Temp (!) 97.5 F (36.4 C) (Temporal)   Resp 16   Ht 5\' 7"  (1.702 m)   Wt 214 lb (97.1 kg)   LMP 01/20/2016   SpO2 99%   BMI 33.52 kg/m   Wt Readings from Last 3 Encounters:  11/12/20 213 lb  12.8 oz (97 kg)  10/18/20 207 lb (93.9 kg)  08/22/20 216 lb 3.2 oz (98.1 kg)    General: Appears her stated age, obese: In NAD. Skin: Warm, dry and intact.  Contusion noted to left proximal posterior calf. Cardiovascular: Normal rate and rhythm. Pulmonary/Chest: Normal effort and positive vesicular breath sounds.  Musculoskeletal: No difficulty with gait.  Neurological: Alert and oriented.   BMET    Component Value Date/Time   NA 140 09/19/2019 1554   K 3.7 09/19/2019 1554   CL 101 09/19/2019 1554   CO2 33 (H) 09/19/2019 1554   GLUCOSE 106 (H) 09/19/2019 1554   BUN 15 09/19/2019 1554   CREATININE 0.66 09/19/2019 1554   CALCIUM 9.7 09/19/2019 1554   GFRNONAA >60 07/27/2017 2153   GFRAA >60 07/27/2017 2153    Lipid Panel     Component Value Date/Time   CHOL 193 02/09/2019 1245   TRIG 105.0 02/09/2019 1245   HDL 18.00 (L) 02/09/2019 1245   CHOLHDL 11 02/09/2019 1245   VLDL 21.0 02/09/2019 1245   LDLCALC 154 (H) 02/09/2019 1245    CBC    Component Value Date/Time   WBC 4.7 09/19/2019 1554   RBC 4.59 09/19/2019 1554   HGB 13.9 09/19/2019 1554   HCT 41.1 09/19/2019 1554   PLT 222.0 09/19/2019 1554   MCV 89.5 09/19/2019 1554   MCH 29.7 07/27/2017 1023   MCHC 33.8 09/19/2019 1554   RDW 14.4 09/19/2019 1554   LYMPHSABS 1.1 07/28/2016 1232   MONOABS 0.4 07/28/2016 1232   EOSABS 0.0 07/28/2016 1232   BASOSABS 0.0 07/28/2016 1232    Hgb A1C Lab Results  Component Value Date   HGBA1C 5.9 02/09/2019            Assessment & Plan:   Contusion of Left posterior Calf:  Reassurance provided that this will resolve with time No treatment needed at this time  Screen for STD:  We will check urine gonorrhea, chlamydia and trich We will check HIV and RPR  Return precautions discussed  02/11/2019, NP This visit occurred during the SARS-CoV-2 public health emergency.  Safety protocols were in place, including screening questions prior to the visit, additional  usage of staff PPE, and extensive cleaning of exam room while observing appropriate contact time as indicated for disinfecting solutions.

## 2021-01-02 NOTE — Patient Instructions (Signed)
Contusion A contusion is a deep bruise. This is a result of an injury that causes bleeding under the skin. Symptoms of bruising include pain, swelling, and discolored skin. The skin may turn blue, purple, or yellow. Follow these instructions at home: Managing pain, stiffness, and swelling You may use RICE. This stands for: Resting. Icing. Compression, or putting pressure. Elevating, or raising the injured area. To follow this method, do these actions: Rest the injured area. If told, put ice on the injured area. Put ice in a plastic bag. Place a towel between your skin and the bag. Leave the ice on for 20 minutes, 2-3 times per day. If told, put light pressure (compression) on the injured area using an elastic bandage. Make sure the bandage is not too tight. If the area tingles or becomes numb, remove it and put it back on as told by your doctor. If possible, raise (elevate) the injured area above the level of your heart while you are sitting or lying down.  General instructions Take over-the-counter and prescription medicines only as told by your doctor. Keep all follow-up visits as told by your doctor. This is important. Contact a doctor if: Your symptoms do not get better after several days of treatment. Your symptoms get worse. You have trouble moving the injured area. Get help right away if: You have very bad pain. You have a loss of feeling (numbness) in a hand or foot. Your hand or foot turns pale or cold. Summary A contusion is a deep bruise. This is a result of an injury that causes bleeding under the skin. Symptoms of bruising include pain, swelling, and discolored skin. The skin may turn blue, purple, or yellow. This condition is treated with rest, ice, compression, and elevation. This is also called RICE. You may be given over-the-counter medicines for pain. Contact a doctor if you do not feel better, or you feel worse. Get help right away if you have very bad pain, have  lost feeling in a hand or foot, or the area turns pale or cold. This information is not intended to replace advice given to you by your health care provider. Make sure you discuss any questions you have with your health care provider. Document Revised: 11/26/2017 Document Reviewed: 11/26/2017 Elsevier Patient Education  2022 Elsevier Inc.  

## 2021-01-03 ENCOUNTER — Encounter: Payer: Self-pay | Admitting: Internal Medicine

## 2021-01-03 LAB — URINE CYTOLOGY ANCILLARY ONLY
Chlamydia: NEGATIVE
Comment: NEGATIVE
Comment: NEGATIVE
Comment: NORMAL
Neisseria Gonorrhea: NEGATIVE
Trichomonas: NEGATIVE

## 2021-01-03 LAB — HIV ANTIBODY (ROUTINE TESTING W REFLEX): HIV 1&2 Ab, 4th Generation: NONREACTIVE

## 2021-01-03 LAB — RPR: RPR Ser Ql: NONREACTIVE

## 2021-01-07 ENCOUNTER — Ambulatory Visit: Payer: Self-pay | Admitting: *Deleted

## 2021-01-07 NOTE — Telephone Encounter (Signed)
Reason for Disposition  [1] Applying cream or ointment AND [2] causes severe itch, burning or pain    Patient is calling to report she has localized rash under her breast- burning- A&D ointment not helping- red and raw. Call to office- appointment scheduled  Answer Assessment - Initial Assessment Questions 1. APPEARANCE of RASH: "Describe the rash."      Red/raw rash 2. LOCATION: "Where is the rash located?"      Under breast 3. NUMBER: "How many spots are there?"      Red-raw- not raised 4. SIZE: "How big are the spots?" (Inches, centimeters or compare to size of a coin)      Large area 5. ONSET: "When did the rash start?"      Last night 6. ITCHING: "Does the rash itch?" If Yes, ask: "How bad is the itch?"  (Scale 0-10; or none, mild, moderate, severe)     No itching 7. PAIN: "Does the rash hurt?" If Yes, ask: "How bad is the pain?"  (Scale 0-10; or none, mild, moderate, severe)    - NONE (0): no pain    - MILD (1-3): doesn't interfere with normal activities     - MODERATE (4-7): interferes with normal activities or awakens from sleep     - SEVERE (8-10): excruciating pain, unable to do any normal activities     No- burning 8. OTHER SYMPTOMS: "Do you have any other symptoms?" (e.g., fever)     no 9. PREGNANCY: "Is there any chance you are pregnant?" "When was your last menstrual period?"     N/a  Protocols used: Rash or Redness - Localized-A-AH

## 2021-01-07 NOTE — Telephone Encounter (Signed)
Will discuss at upcoming appt.

## 2021-01-08 ENCOUNTER — Ambulatory Visit: Payer: BC Managed Care – PPO | Admitting: Internal Medicine

## 2021-01-17 ENCOUNTER — Encounter: Payer: Self-pay | Admitting: Internal Medicine

## 2021-01-17 ENCOUNTER — Telehealth (INDEPENDENT_AMBULATORY_CARE_PROVIDER_SITE_OTHER): Payer: BC Managed Care – PPO | Admitting: Internal Medicine

## 2021-01-17 ENCOUNTER — Other Ambulatory Visit: Payer: Self-pay

## 2021-01-17 ENCOUNTER — Other Ambulatory Visit: Payer: Self-pay | Admitting: Internal Medicine

## 2021-01-17 DIAGNOSIS — B9789 Other viral agents as the cause of diseases classified elsewhere: Secondary | ICD-10-CM | POA: Diagnosis not present

## 2021-01-17 DIAGNOSIS — J329 Chronic sinusitis, unspecified: Secondary | ICD-10-CM | POA: Diagnosis not present

## 2021-01-17 MED ORDER — HYDROCODONE BIT-HOMATROP MBR 5-1.5 MG/5ML PO SOLN: 5.0000 mL | Freq: Three times a day (TID) | ORAL | 0 refills | Status: DC | PRN

## 2021-01-17 MED ORDER — PREDNISONE 10 MG PO TABS: ORAL_TABLET | ORAL | 0 refills | Status: DC

## 2021-01-17 NOTE — Progress Notes (Signed)
Virtual Visit via Video Note  I connected with Diana [name] on 01/17/21 at  2:40 PM EDT by a video enabled telemedicine application and verified that I am speaking with the correct person using two identifiers.  Location: Patient: Home Provider: Office   Persons participating in this video call: [name], NP and Margo Aye.  I discussed the limitations of evaluation and management by telemedicine and the availability of in person appointments. The patient expressed understanding and agreed to proceed.  History of Present Illness:  Patient reports pressure, runny nose, scratchy throat and cough.  She reports this started 4 days ago.  She is blowing yellow mucous out of her nose.  She denies difficulty swallowing.  The cough is nonproductive.  She denies headache, nasal congestion, ear pain, shortness of breath, nausea, vomiting or diarrhea.  She denies fever, chills or body aches.  She has had 2 negative COVID test at home.  Past Medical History:  Diagnosis Date   Allergy    Ear infection    recurrent    Current Outpatient Medications  Medication Sig Dispense Refill   Ascorbic Acid 125 MG CHEW Chew by mouth.     cetirizine (ZYRTEC) 10 MG tablet Take 10 mg by mouth daily.     Cholecalciferol (VITAMIN D3) 25 MCG (1000 UT) CAPS Take 5,000 Units by mouth.      clonazePAM (KLONOPIN) 0.5 MG tablet TAKE 1 TABLET BY MOUTH DAILY AS NEEDED FOR ANXIETY 30 tablet 0   hydrochlorothiazide (MICROZIDE) 12.5 MG capsule TAKE 1 CAPSULE BY MOUTH DAILY. MUST SCHEDULE PHYSICAL EXAM 90 capsule 1   hydrocortisone (ANUSOL-HC) 2.5 % rectal cream Place 1 application rectally 2 (two) times daily. 30 g 0   omeprazole (PRILOSEC) 40 MG capsule Take 1 capsule (40 mg total) by mouth daily. 90 capsule 1   triamcinolone cream (KENALOG) 0.1 % Apply 1 application topically 2 (two) times daily. 30 g 0   Zinc 50 MG CAPS Take by mouth.     No current facility-administered medications for this visit.    No  Known Allergies  Family History  Problem Relation Age of Onset   Hypertension Mother        alive   Arthritis Mother    Lung cancer Father        deceased   Heart disease Father    Other Other        1 brother and 1 sister both alive an healthy   Hypertension Maternal Grandmother     Social History   Socioeconomic History   Marital status: Widowed    Spouse name: Not on file   Number of children: 3   Years of education: Not on file   Highest education level: Not on file  Occupational History   Occupation: customer service    Employer: [employer]  Tobacco Use   Smoking status: Former    Types: Cigarettes    Quit date: 04/20/1985    Years since quitting: 35.7   Smokeless tobacco: Never   Tobacco comments:    remote tobacco use   Vaping Use   Vaping Use: Never used  Substance and Sexual Activity   Alcohol use: No    Alcohol/week: 0.0 standard drinks   Drug use: No   Sexual activity: Never  Other Topics Concern   Not on file  Social History Narrative   Not on file   Social Determinants of Health   Financial Resource Strain: Not on file  Food  Insecurity: Not on file  Transportation Needs: Not on file  Physical Activity: Not on file  Stress: Not on file  Social Connections: Not on file  Intimate Partner Violence: Not on file     Constitutional:  Denies headache, fever, malaise, fatigue, or abrupt weight changes.  HEENT: Patient reports sinus pressure and scratchy throat.  Denies eye pain, eye redness, ear pain, ringing in the ears, wax buildup, runny nose, nasal congestion, bloody nose. Respiratory: Patient reports cough.  Denies difficulty breathing, shortness of breath,  or sputum production.   Cardiovascular: Denies chest pain, chest tightness, palpitations or swelling in the hands or feet.   No other specific complaints in a complete review of systems (except as listed in HPI above).    Observations/Objective:  LMP 01/20/2016  Wt Readings from Last  3 Encounters:  01/02/21 214 lb (97.1 kg)  11/12/20 213 lb 12.8 oz (97 kg)  10/18/20 207 lb (93.9 kg)    General: Appears her stated age, ill-appearing but in NAD. HEENT: Head: normal shape and size; Nose: No congestion noted; Throat/Mouth: Hoarseness noted Pulmonary/Chest: Normal effort. No respiratory distress.  Neurological: Alert and oriented.   BMET    Component Value Date/Time   NA 140 09/19/2019 1554   K 3.7 09/19/2019 1554   CL 101 09/19/2019 1554   CO2 33 (H) 09/19/2019 1554   GLUCOSE 106 (H) 09/19/2019 1554   BUN 15 09/19/2019 1554   CREATININE 0.66 09/19/2019 1554   CALCIUM 9.7 09/19/2019 1554   GFRNONAA >60 07/27/2017 2153   GFRAA >60 07/27/2017 2153    Lipid Panel     Component Value Date/Time   CHOL 193 02/09/2019 1245   TRIG 105.0 02/09/2019 1245   HDL 18.00 (L) 02/09/2019 1245   CHOLHDL 11 02/09/2019 1245   VLDL 21.0 02/09/2019 1245   LDLCALC 154 (H) 02/09/2019 1245    CBC    Component Value Date/Time   WBC 4.7 09/19/2019 1554   RBC 4.59 09/19/2019 1554   HGB 13.9 09/19/2019 1554   HCT 41.1 09/19/2019 1554   PLT 222.0 09/19/2019 1554   MCV 89.5 09/19/2019 1554   MCH 29.7 07/27/2017 1023   MCHC 33.8 09/19/2019 1554   RDW 14.4 09/19/2019 1554   LYMPHSABS 1.1 07/28/2016 1232   MONOABS 0.4 07/28/2016 1232   EOSABS 0.0 07/28/2016 1232   BASOSABS 0.0 07/28/2016 1232    Hgb A1C Lab Results  Component Value Date   HGBA1C 5.9 02/09/2019       Assessment and Plan:  Viral Sinusitis:  Likely related to allergies, change in weather Rx for Pred taper x6 days Rx for Hycodan cough syrup at bedtime-sedation caution given Take Zyrtec OTC  Return precautions discussed  Follow Up Instructions:    I discussed the assessment and treatment plan with the patient. The patient was provided an opportunity to ask questions and all were answered. The patient agreed with the plan and demonstrated an understanding of the instructions.   The patient was  advised to call back or seek an in-person evaluation if the symptoms worsen or if the condition fails to improve as anticipated.   [name], NP

## 2021-01-17 NOTE — Telephone Encounter (Signed)
Medication Refill - Medication: HYDROcodone bit-homatropine (HYCODAN) 5-1.5 MG/5ML syrup   Pt called reporting that a Prior Authorization is needed, please advise   Has the patient contacted their pharmacy? Yes.   (Agent: If no, request that the patient contact the pharmacy for the refill.) (Agent: If yes, when and what did the pharmacy advise?)  Preferred Pharmacy (with phone number or street name):  [id number] - [place], [place] - [address]  [address] [place] [address]  Phone: [phone] Fax: [phone]   Has the patient been seen for an appointment in the last year OR does the patient have an upcoming appointment? Yes.    Agent: Please be advised that RX refills may take up to 3 business days. We ask that you follow-up with your pharmacy.

## 2021-01-17 NOTE — Patient Instructions (Signed)

## 2021-01-18 NOTE — Telephone Encounter (Signed)
Requested medications are due for refill today no  Requested medications are on the active medication list yes  Last refill yesterday, filled at different pharm than is now requesting  Notes to clinic rx approved yesterday at different pharm, please assess.

## 2021-01-20 ENCOUNTER — Telehealth: Payer: Self-pay

## 2021-01-20 NOTE — Telephone Encounter (Signed)
    Copied from CRM [id number]. Topic: General - Other >> Jan 17, 2021 12:22 PM Jaquita Rector A wrote: Reason for CRM: Hi patient is scheduled for a virtual appt on 01/21/21 was trying to put her on for this afternoon at that 4 PM slot can someone please help. And call patient and inform at Ph# [phone]

## 2021-01-20 NOTE — Telephone Encounter (Signed)
This has been resolved.  Pt had a virtual visit 01/17/2021

## 2021-01-21 ENCOUNTER — Telehealth: Payer: Self-pay | Admitting: Internal Medicine

## 2021-01-21 ENCOUNTER — Telehealth: Payer: BC Managed Care – PPO | Admitting: Internal Medicine

## 2021-01-21 NOTE — Telephone Encounter (Signed)
HYDROcodone bit-homatropine (HYCODAN) 5-1.5 MG/5ML syrup 120 mL 0 01/17/2021    Sig - Route: Take 5 mLs by mouth every 8 (eight) hours as needed for cough. 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 No doced calls/ Pls resend to: [id number] - [place], [place] - [address] Phone:  [phone]  Fax:  [phone]    Resend!

## 2021-01-22 MED ORDER — HYDROCODONE BIT-HOMATROP MBR 5-1.5 MG/5ML PO SOLN: 5.0000 mL | Freq: Three times a day (TID) | ORAL | 0 refills | Status: DC | PRN

## 2021-01-22 NOTE — Addendum Note (Signed)
Addended by: [name] on: 01/22/2021 08:02 AM   Modules accepted: Orders

## 2021-01-22 NOTE — Telephone Encounter (Signed)
Rx for Hycodan sent to [facility]

## 2021-01-23 ENCOUNTER — Other Ambulatory Visit: Payer: Self-pay

## 2021-01-23 ENCOUNTER — Other Ambulatory Visit (HOSPITAL_COMMUNITY)
Admission: RE | Admit: 2021-01-23 | Discharge: 2021-01-23 | Disposition: A | Discharge status: Home / Self Care | Payer: BC Managed Care – PPO | Source: Ambulatory Visit | Attending: Internal Medicine | Admitting: Internal Medicine

## 2021-01-23 ENCOUNTER — Ambulatory Visit: Payer: BC Managed Care – PPO | Admitting: Internal Medicine

## 2021-01-23 ENCOUNTER — Encounter: Payer: Self-pay | Admitting: Internal Medicine

## 2021-01-23 VITALS — BP 134/75 | HR 74 | Temp 97.3°F | Resp 18 | Ht 67.0 in | Wt 214.0 lb

## 2021-01-23 DIAGNOSIS — R3915 Urgency of urination: Secondary | ICD-10-CM

## 2021-01-23 DIAGNOSIS — N949 Unspecified condition associated with female genital organs and menstrual cycle: Secondary | ICD-10-CM | POA: Insufficient documentation

## 2021-01-23 DIAGNOSIS — Z113 Encounter for screening for infections with a predominantly sexual mode of transmission: Secondary | ICD-10-CM | POA: Diagnosis not present

## 2021-01-23 NOTE — Patient Instructions (Signed)
Vaginitis Vaginitis is irritation and swelling of the vagina. Treatment will depend on the cause. What are the causes? It can be caused by: Bacteria. Yeast. A parasite. A virus. Low hormone levels. Bubble baths, scented tampons, and feminine sprays. Other things can change the balance of the yeast and bacteria that live in the vagina. These include: Antibiotic medicines. Not being clean enough. Some birth control methods. Sex. Infection. Diabetes. A weakened body defense system (immune system). What increases the risk? Smoking or being around someone who smokes. Using washes (douches), scented tampons, or scented pads. Wearing tight pants or thong underwear. Using birth control pills or an IUD. Having sex without a condom or having a lot of partners. Having an STI. Using a certain product to kill sperm (nonoxynol-9). Eating foods that are high in sugar. Having diabetes. Having low levels of a female hormone. Having a weakened body defense system. Being pregnant or breastfeeding. What are the signs or symptoms? Fluid coming from the vagina that is not normal. A bad smell. Itching, pain, or swelling. Pain with sex. Pain or burning when you pee (urinate). Sometimes there are no symptoms. How is this treated? Treatment may include: Antibiotic creams or pills. Antifungal medicines. Medicines to ease symptoms if you have a virus. Your sex partner should also be treated. Estrogen medicines. Avoiding scented soaps, sprays, or douches. Stopping use of products that caused irritation and then using a cream to treat symptoms. Follow these instructions at home: Lifestyle Keep the area around your vagina clean and dry. Avoid using soap. Rinse the area with water. Until your doctor says it is okay: Do not use washes for the vagina. Do not use tampons. Do not have sex. Wipe from front to back after going to the bathroom. When your doctor says it is okay, practice safe sex and  use condoms. General instructions Take over-the-counter and prescription medicines only as told by your doctor. If you were prescribed an antibiotic medicine, take or use it as told by your doctor. Do not stop taking or using it even if you start to feel better. Keep all follow-up visits. How is this prevented? Do not use things that can irritate the vagina, such as fabric softeners. Avoid these products if they are scented: Sprays. Detergents. Tampons. Products for cleaning the vagina. Soaps or bubble baths. Let air reach your vagina. To do this: Wear cotton underwear. Do not wear: Underwear while you sleep. Tight pants. Thong underwear. Underwear or nylons without a cotton panel. Take off any wet clothing, such as bathing suits, as soon as you can. Practice safe sex and use condoms. Contact a doctor if: You have pain in your belly or in the area between your hips. You have a fever or chills. Your symptoms last for more than 2-3 days. Get help right away if: You have a fever and your symptoms get worse all of a sudden. Summary Vaginitis is irritation and swelling of the vagina. Treatment will depend on the cause of the condition. Do not use washes or tampons or have sex until your doctor says it is okay. This information is not intended to replace advice given to you by your health care provider. Make sure you discuss any questions you have with your health care provider. Document Revised: 10/05/2019 Document Reviewed: 10/05/2019 Elsevier Patient Education  2022 Elsevier Inc.  

## 2021-01-23 NOTE — Progress Notes (Signed)
HPI  Pt presents to the clinic today with c/o urinary urgency and vaginal burning. She reports this started 3-4 days.  She denies frequency, dysuria, blood in her urine, vaginal discharge, vaginal itching, odor or abnormal vaginal bleeding.  She denies pelvic pain, flank pain, fever, chills, nausea or vomiting.  She has tried AZO with minimal relief of symptoms.  She reports history of interstitial cystitis and has been drinking more sodas than usual and is not sure if this is a contributing factor.   Review of Systems  Past Medical History:  Diagnosis Date   Allergy    Ear infection    recurrent    Family History  Problem Relation Age of Onset   Hypertension Mother        alive   Arthritis Mother    Lung cancer Father        deceased   Heart disease Father    Other Other        1 brother and 1 sister both alive an healthy   Hypertension Maternal Grandmother     Social History   Socioeconomic History   Marital status: Widowed    Spouse name: Not on file   Number of children: 3   Years of education: Not on file   Highest education level: Not on file  Occupational History   Occupation: customer service    Employer: [employer]  Tobacco Use   Smoking status: Former    Types: Cigarettes    Quit date: 04/20/1985    Years since quitting: 35.7   Smokeless tobacco: Never   Tobacco comments:    remote tobacco use   Vaping Use   Vaping Use: Never used  Substance and Sexual Activity   Alcohol use: No    Alcohol/week: 0.0 standard drinks   Drug use: No   Sexual activity: Never  Other Topics Concern   Not on file  Social History Narrative   Not on file   Social Determinants of Health   Financial Resource Strain: Not on file  Food Insecurity: Not on file  Transportation Needs: Not on file  Physical Activity: Not on file  Stress: Not on file  Social Connections: Not on file  Intimate Partner Violence: Not on file    No Known Allergies   Constitutional: Denies  fever, malaise, fatigue, headache or abrupt weight changes.   GU: Pt reports urgency, vaginal burning. Denies frequency, dysuria, blood in urine, itching, odor or discharge. Skin: Denies redness, rashes, lesions or ulcercations.   No other specific complaints in a complete review of systems (except as listed in HPI above).    Objective:   Physical Exam BP 134/75 (BP Location: Right Arm, Patient Position: Sitting, Cuff Size: Large)   Pulse 74   Temp (!) 97.3 F (36.3 C) (Temporal)   Resp 18   Ht 5\' 7"  (1.702 m)   Wt 214 lb (97.1 kg)   LMP 01/20/2016   SpO2 100%   BMI 33.52 kg/m   Wt Readings from Last 3 Encounters:  01/02/21 214 lb (97.1 kg)  11/12/20 213 lb 12.8 oz (97 kg)  10/18/20 207 lb (93.9 kg)    General: Appears her stated age, obese, in NAD. Cardiovascular: Normal rate and rhythm. S1,S2 noted.   Pulmonary/Chest: Normal effort and positive vesicular breath sounds. No respiratory distress. No wheezes, rales or ronchi noted.  Abdomen: Soft and nontender. Normal bowel sounds. No distention or masses noted.  No CVA tenderness.  Assessment & Plan:   Urgency,Vaginal Burning:  Urinalysis: Not done as she took AZO Will send urine culture Self swab wet prep to check for G/C, trich, BV and yeast OK to take AZO OTC Drink plenty of fluids  RTC as needed or if symptoms persist. [name], NP This visit occurred during the SARS-CoV-2 public health emergency.  Safety protocols were in place, including screening questions prior to the visit, additional usage of staff PPE, and extensive cleaning of exam room while observing appropriate contact time as indicated for disinfecting solutions.

## 2021-01-24 LAB — CERVICOVAGINAL ANCILLARY ONLY
Bacterial Vaginitis (gardnerella): NEGATIVE
Candida Glabrata: NEGATIVE
Candida Vaginitis: NEGATIVE
Chlamydia: NEGATIVE
Comment: NEGATIVE
Comment: NEGATIVE
Comment: NEGATIVE
Comment: NEGATIVE
Comment: NEGATIVE
Comment: NORMAL
Neisseria Gonorrhea: NEGATIVE
Trichomonas: NEGATIVE

## 2021-01-25 ENCOUNTER — Encounter: Payer: Self-pay | Admitting: Internal Medicine

## 2021-01-25 LAB — URINE CULTURE
MICRO NUMBER:: 12473636
SPECIMEN QUALITY:: ADEQUATE

## 2021-01-27 ENCOUNTER — Telehealth: Payer: Self-pay

## 2021-01-27 NOTE — Telephone Encounter (Signed)
Copied from CRM [id number]. Topic: General - Other >> Jan 27, 2021 10:18 AM Elliot Gault wrote: Reason for CRM: patient follow up regarding My Chart Message and would like a follow up, please leave a message if unable to reach.

## 2021-01-28 ENCOUNTER — Other Ambulatory Visit: Payer: Self-pay

## 2021-01-28 ENCOUNTER — Ambulatory Visit (INDEPENDENT_AMBULATORY_CARE_PROVIDER_SITE_OTHER): Payer: BC Managed Care – PPO | Admitting: Internal Medicine

## 2021-01-28 ENCOUNTER — Encounter: Payer: Self-pay | Admitting: Internal Medicine

## 2021-01-28 ENCOUNTER — Other Ambulatory Visit (HOSPITAL_COMMUNITY)
Admission: RE | Admit: 2021-01-28 | Discharge: 2021-01-28 | Disposition: A | Discharge status: Home / Self Care | Payer: BC Managed Care – PPO | Source: Ambulatory Visit | Attending: Internal Medicine | Admitting: Internal Medicine

## 2021-01-28 VITALS — BP 140/73 | HR 77 | Temp 97.7°F | Resp 18 | Ht 67.0 in | Wt 213.2 lb

## 2021-01-28 DIAGNOSIS — N9089 Other specified noninflammatory disorders of vulva and perineum: Secondary | ICD-10-CM | POA: Diagnosis not present

## 2021-01-28 DIAGNOSIS — K644 Residual hemorrhoidal skin tags: Secondary | ICD-10-CM

## 2021-01-28 MED ORDER — HYDROCORTISONE ACETATE 25 MG RE SUPP: 25.0000 mg | Freq: Two times a day (BID) | RECTAL | 0 refills | Status: DC

## 2021-01-28 MED ORDER — HYDROCORTISONE (PERIANAL) 2.5 % EX CREA: 1.0000 "application " | TOPICAL_CREAM | Freq: Two times a day (BID) | CUTANEOUS | 0 refills | Status: DC

## 2021-01-28 NOTE — Progress Notes (Signed)
Subjective:    Patient ID: Diana [name], female    DOB: Jul 20, 1960, 60 y.o.   MRN: [id number]  HPI  Pt reports lesions the vaginal area. She noticed this about 3 days ago.  She noticed the bumps after shaving in the area.  They are not tender.  They have not increased in size.  She has not noticed any drainage from the area.  She has some vaginal burning but attributes this to her cystitis.  She was recently seen for the same, urine culture and STD screening was negative.  She has not tried anything OTC for this.  She also reports external hemorrhoids.  She needs a refill of her hemorrhoid cream.  Review of Systems     Past Medical History:  Diagnosis Date   Allergy    Ear infection    recurrent    Current Outpatient Medications  Medication Sig Dispense Refill   Ascorbic Acid 125 MG CHEW Chew by mouth.     cetirizine (ZYRTEC) 10 MG tablet Take 10 mg by mouth daily.     Cholecalciferol (VITAMIN D3) 25 MCG (1000 UT) CAPS Take 5,000 Units by mouth.      clonazePAM (KLONOPIN) 0.5 MG tablet TAKE 1 TABLET BY MOUTH DAILY AS NEEDED FOR ANXIETY 30 tablet 0   hydrochlorothiazide (MICROZIDE) 12.5 MG capsule TAKE 1 CAPSULE BY MOUTH DAILY. MUST SCHEDULE PHYSICAL EXAM 90 capsule 1   hydrocortisone (ANUSOL-HC) 2.5 % rectal cream Place 1 application rectally 2 (two) times daily. 30 g 0   omeprazole (PRILOSEC) 40 MG capsule Take 1 capsule (40 mg total) by mouth daily. 90 capsule 1   triamcinolone cream (KENALOG) 0.1 % Apply 1 application topically 2 (two) times daily. 30 g 0   Zinc 50 MG CAPS Take by mouth.     No current facility-administered medications for this visit.    No Known Allergies  Family History  Problem Relation Age of Onset   Hypertension Mother        alive   Arthritis Mother    Lung cancer Father        deceased   Heart disease Father    Other Other        1 brother and 1 sister both alive an healthy   Hypertension Maternal Grandmother     Social History    Socioeconomic History   Marital status: Widowed    Spouse name: Not on file   Number of children: 3   Years of education: Not on file   Highest education level: Not on file  Occupational History   Occupation: customer service    Employer: [employer]  Tobacco Use   Smoking status: Former    Types: Cigarettes    Quit date: 04/20/1985    Years since quitting: 35.8   Smokeless tobacco: Never   Tobacco comments:    remote tobacco use   Vaping Use   Vaping Use: Never used  Substance and Sexual Activity   Alcohol use: No    Alcohol/week: 0.0 standard drinks   Drug use: No   Sexual activity: Never  Other Topics Concern   Not on file  Social History Narrative   Not on file   Social Determinants of Health   Financial Resource Strain: Not on file  Food Insecurity: Not on file  Transportation Needs: Not on file  Physical Activity: Not on file  Stress: Not on file  Social Connections: Not on file  Intimate Partner Violence: Not  on file     Constitutional: Denies fever, malaise,  headache or abrupt weight changes.  Respiratory: Denies difficulty breathing, shortness of breath, cough or sputum production.   Cardiovascular: Denies chest pain, chest tightness, palpitations or swelling in the hands or feet.  Gastrointestinal: Denies abdominal pain, bloating, constipation, diarrhea or blood in the stool.  GU: Patient reports vaginal burning.  Denies urgency, frequency, pain with urination, burning sensation, blood in urine, odor or discharge. Skin: Pt reports lesions in the vaginal region. Denies redness, rashes, or ulcercations.   No other specific complaints in a complete review of systems (except as listed in HPI above).  Objective:   Physical Exam  BP 140/73 (BP Location: Right Arm, Patient Position: Sitting, Cuff Size: Large)   Pulse 77   Temp 97.7 F (36.5 C) (Temporal)   Resp 18   Ht 5\' 7"  (1.702 m)   Wt 213 lb 3.2 oz (96.7 kg)   LMP 01/20/2016   SpO2 100%   BMI  33.39 kg/m   Wt Readings from Last 3 Encounters:  01/23/21 214 lb (97.1 kg)  01/02/21 214 lb (97.1 kg)  11/12/20 213 lb 12.8 oz (97 kg)    General: Appears her stated age, obese, in NAD. Skin: Warm, dry and intact.  She has one 3 mm raised cauliflower-like lesion noted of the right lower labia.  She has 2  2 mm raised rough lesions of the left lower labia. Cardiovascular: Normal rate and rhythm.  Pulmonary/Chest: Normal effort and positive vesicular breath sounds. No respiratory distress. No wheezes, rales or ronchi noted.  Abdomen: Soft and nontender.  Pelvic: Normal female anatomy.  No discharge noted in the vaginal vault. Neurological: Alert and oriented.  Psychiatric: Anxious appearing.  BMET    Component Value Date/Time   NA 140 09/19/2019 1554   K 3.7 09/19/2019 1554   CL 101 09/19/2019 1554   CO2 33 (H) 09/19/2019 1554   GLUCOSE 106 (H) 09/19/2019 1554   BUN 15 09/19/2019 1554   CREATININE 0.66 09/19/2019 1554   CALCIUM 9.7 09/19/2019 1554   GFRNONAA >60 07/27/2017 2153   GFRAA >60 07/27/2017 2153    Lipid Panel     Component Value Date/Time   CHOL 193 02/09/2019 1245   TRIG 105.0 02/09/2019 1245   HDL 18.00 (L) 02/09/2019 1245   CHOLHDL 11 02/09/2019 1245   VLDL 21.0 02/09/2019 1245   LDLCALC 154 (H) 02/09/2019 1245    CBC    Component Value Date/Time   WBC 4.7 09/19/2019 1554   RBC 4.59 09/19/2019 1554   HGB 13.9 09/19/2019 1554   HCT 41.1 09/19/2019 1554   PLT 222.0 09/19/2019 1554   MCV 89.5 09/19/2019 1554   MCH 29.7 07/27/2017 1023   MCHC 33.8 09/19/2019 1554   RDW 14.4 09/19/2019 1554   LYMPHSABS 1.1 07/28/2016 1232   MONOABS 0.4 07/28/2016 1232   EOSABS 0.0 07/28/2016 1232   BASOSABS 0.0 07/28/2016 1232    Hgb A1C Lab Results  Component Value Date   HGBA1C 5.9 02/09/2019            Assessment & Plan:   Labial Lesions:  Genital warts versus genital herpes Lesions swabbed for determination of HPV versus HSV We will check HSV  1 and 2 IgG and IgM and blood work  External Hemorrhoids:  Refilled Anusol's cream Rx for hydrocortisone suppositories twice daily as needed Avoid straining  We will follow-up after lab with further recommendations and treatment  02/11/2019, NP This  visit occurred during the SARS-CoV-2 public health emergency.  Safety protocols were in place, including screening questions prior to the visit, additional usage of staff PPE, and extensive cleaning of exam room while observing appropriate contact time as indicated for disinfecting solutions.

## 2021-01-30 ENCOUNTER — Encounter: Payer: Self-pay | Admitting: Internal Medicine

## 2021-01-30 LAB — CERVICOVAGINAL ANCILLARY ONLY
Candida Glabrata: NEGATIVE
Candida Vaginitis: NEGATIVE
Comment: NEGATIVE
Comment: NEGATIVE

## 2021-01-30 NOTE — Patient Instructions (Signed)
Genital Warts  Genital warts are a common STI (sexually transmitted infection). They are small warts in the area around the genitals or the opening of the butt (anus). They sometimes cause pain. Genital warts are easily passed to other peoplethrough sex. Many people do not know that they are infected. They may be infected for years without symptoms. Even without symptoms, they can pass the infection to theirsex partners. What are the causes? Genital warts are caused by a virus called HPV. HPV spreads from person toperson during sex. It can be spread through vaginal, anal, and oral sex. What increases the risk? Having sex without using a condom. Having sex with many people. Having sex before the age of 16. Being a female who is not circumcised. Having a female sex partner who is not circumcised. Having a weak body defense system (immune system). What are the signs or symptoms? Small warts in the area around the genitals or the opening of the butt. These warts often grow in clusters. Itching and irritation in the genital area or the area around the opening of the butt. Bleeding from the warts. Pain during sex. How is this treated? Medicines, such as creams, that are put on the skin. Procedures, such as: Freezing the warts. Burning the warts. Having surgery to get rid of the warts. Getting treatment is important because genital warts can lead to other problems. In females, the virus that causes the warts may increase the risk forcancer of the cervix. Follow these instructions at home: Medicines  Apply over-the-counter and prescription medicines only as told by your doctor. Do not use medicines that are meant for treating hand warts. Talk with your doctor about using creams to treat itching.  Instructions for women Get regular tests to check for cancer of the cervix. This type of cancer grows slowly and can almost always be cured if it is found early. If you become pregnant, tell your  doctor that you have had genital warts. General instructions Do not touch or scratch the warts. Do not have sex until your treatment is done. Tell your current and past sex partners about your condition. They may need treatment. After treatment, use condoms during sex. Keep all follow-up visits as told by your doctor. This is important. How is this prevented? Talk with your doctor about getting the HPV shot. The HPV shot: Can help stop some HPV infections and cancers. Is given to males and females who are 11-26 years old. Is not recommended for pregnant women. Will not work if you already have HPV. Contact a doctor if: You have redness, swelling, or pain in the area of the treated skin. You have a fever. You feel sick. You have lumps or have bleeding in the area around your genitals or the opening of your butt. You have pain during sex. You have bleeding after sex. Summary Genital warts are a common STI. They are small warts in the area around the genitals or the opening of the butt (anus). A virus called HPV causes genital warts. The virus is spread by having sex without using a condom. Getting treatment is important. This condition is treated using medicines. Freezing, burning, or surgery may be done to get rid of the warts. This information is not intended to replace advice given to you by your health care provider. Make sure you discuss any questions you have with your healthcare provider. Document Revised: 02/16/2019 Document Reviewed: 02/16/2019 Elsevier Patient Education  2022 [employer] Inc.  

## 2021-01-31 ENCOUNTER — Ambulatory Visit: Payer: BC Managed Care – PPO | Admitting: Internal Medicine

## 2021-01-31 ENCOUNTER — Telehealth: Payer: Self-pay

## 2021-01-31 NOTE — Telephone Encounter (Signed)
I called the patient to reschedule her appt and she said she got her lab results in her mychart and needed more information. I explained to the patient that her test came back showing she was exposed to HSV-1 which usually causes blisters around the mouth and lips. She wanted to know if that is what is going on in her vaginal area. Please advise

## 2021-02-02 NOTE — Telephone Encounter (Cosign Needed)
See result note.  

## 2021-02-05 LAB — HSV 1/2 AB (IGM), IFA W/RFLX TITER
HSV 1 IgM Screen: NEGATIVE
HSV 2 IgM Screen: NEGATIVE

## 2021-02-05 LAB — HSV(HERPES SIMPLEX VRS) I + II AB-IGG
HAV 1 IGG,TYPE SPECIFIC AB: 3.28 index — ABNORMAL HIGH
HSV 2 IGG,TYPE SPECIFIC AB: <0.9 index

## 2021-02-06 ENCOUNTER — Ambulatory Visit: Payer: BC Managed Care – PPO | Admitting: Internal Medicine

## 2021-02-06 ENCOUNTER — Other Ambulatory Visit: Payer: Self-pay

## 2021-02-06 ENCOUNTER — Encounter: Payer: Self-pay | Admitting: Internal Medicine

## 2021-02-06 VITALS — BP 129/74 | HR 77 | Temp 97.5°F | Resp 17 | Ht 67.0 in | Wt 215.6 lb

## 2021-02-06 DIAGNOSIS — B181 Chronic viral hepatitis B without delta-agent: Secondary | ICD-10-CM

## 2021-02-06 DIAGNOSIS — Z8619 Personal history of other infectious and parasitic diseases: Secondary | ICD-10-CM | POA: Diagnosis not present

## 2021-02-06 DIAGNOSIS — A630 Anogenital (venereal) warts: Secondary | ICD-10-CM

## 2021-02-06 MED ORDER — IMIQUIMOD 3.75 % EX CREA: 1.0000 "application " | TOPICAL_CREAM | Freq: Every day | CUTANEOUS | 0 refills | Status: DC

## 2021-02-06 NOTE — Assessment & Plan Note (Signed)
CMET ordered 

## 2021-02-06 NOTE — Patient Instructions (Signed)
Genital Warts  Genital warts are a common STI (sexually transmitted infection). They are small warts in the area around the genitals or the opening of the butt (anus). They sometimes cause pain. Genital warts are easily passed to other peoplethrough sex. Many people do not know that they are infected. They may be infected for years without symptoms. Even without symptoms, they can pass the infection to theirsex partners. What are the causes? Genital warts are caused by a virus called HPV. HPV spreads from person toperson during sex. It can be spread through vaginal, anal, and oral sex. What increases the risk? Having sex without using a condom. Having sex with many people. Having sex before the age of 16. Being a female who is not circumcised. Having a female sex partner who is not circumcised. Having a weak body defense system (immune system). What are the signs or symptoms? Small warts in the area around the genitals or the opening of the butt. These warts often grow in clusters. Itching and irritation in the genital area or the area around the opening of the butt. Bleeding from the warts. Pain during sex. How is this treated? Medicines, such as creams, that are put on the skin. Procedures, such as: Freezing the warts. Burning the warts. Having surgery to get rid of the warts. Getting treatment is important because genital warts can lead to other problems. In females, the virus that causes the warts may increase the risk forcancer of the cervix. Follow these instructions at home: Medicines  Apply over-the-counter and prescription medicines only as told by your doctor. Do not use medicines that are meant for treating hand warts. Talk with your doctor about using creams to treat itching.  Instructions for women Get regular tests to check for cancer of the cervix. This type of cancer grows slowly and can almost always be cured if it is found early. If you become pregnant, tell your  doctor that you have had genital warts. General instructions Do not touch or scratch the warts. Do not have sex until your treatment is done. Tell your current and past sex partners about your condition. They may need treatment. After treatment, use condoms during sex. Keep all follow-up visits as told by your doctor. This is important. How is this prevented? Talk with your doctor about getting the HPV shot. The HPV shot: Can help stop some HPV infections and cancers. Is given to males and females who are 11-26 years old. Is not recommended for pregnant women. Will not work if you already have HPV. Contact a doctor if: You have redness, swelling, or pain in the area of the treated skin. You have a fever. You feel sick. You have lumps or have bleeding in the area around your genitals or the opening of your butt. You have pain during sex. You have bleeding after sex. Summary Genital warts are a common STI. They are small warts in the area around the genitals or the opening of the butt (anus). A virus called HPV causes genital warts. The virus is spread by having sex without using a condom. Getting treatment is important. This condition is treated using medicines. Freezing, burning, or surgery may be done to get rid of the warts. This information is not intended to replace advice given to you by your health care provider. Make sure you discuss any questions you have with your healthcare provider. Document Revised: 02/16/2019 Document Reviewed: 02/16/2019 Elsevier Patient Education  2022 [employer] Inc.  

## 2021-02-06 NOTE — Progress Notes (Signed)
Subjective:    Patient ID: Diana [name], female    DOB: 1960-09-19, 60 y.o.   MRN: [id number]  HPI  Pt presents to the clinic today for follow up of recent lab results. She was seen for a labial lesion 01/28/21. It was felt this was either genital herpes or genital warts. Her HSV 1 IgG came back elevated, HSV 2 IgG was negative. Her HSV 1&2 IgM came back negative. She recently had a negative urinalysis, culture and STD screen. She has not tried anything OTC for this. She is sexually active.  She would also like her liver enzymes checked. She has a history of Hep B, not currently actively. She reports she has not had her liver enzymes checked since 2021.  Review of Systems  Past Medical History:  Diagnosis Date   Allergy    Ear infection    recurrent    Current Outpatient Medications  Medication Sig Dispense Refill   Ascorbic Acid 125 MG CHEW Chew by mouth.     cetirizine (ZYRTEC) 10 MG tablet Take 10 mg by mouth daily.     Cholecalciferol (VITAMIN D3) 25 MCG (1000 UT) CAPS Take 5,000 Units by mouth.      clonazePAM (KLONOPIN) 0.5 MG tablet TAKE 1 TABLET BY MOUTH DAILY AS NEEDED FOR ANXIETY 30 tablet 0   hydrochlorothiazide (MICROZIDE) 12.5 MG capsule TAKE 1 CAPSULE BY MOUTH DAILY. MUST SCHEDULE PHYSICAL EXAM 90 capsule 1   hydrocortisone (ANUSOL-HC) 2.5 % rectal cream Place 1 application rectally 2 (two) times daily. 30 g 0   hydrocortisone (ANUSOL-HC) 25 MG suppository Place 1 suppository (25 mg total) rectally 2 (two) times daily. 12 suppository 0   omeprazole (PRILOSEC) 40 MG capsule Take 1 capsule (40 mg total) by mouth daily. 90 capsule 1   triamcinolone cream (KENALOG) 0.1 % Apply 1 application topically 2 (two) times daily. 30 g 0   Zinc 50 MG CAPS Take by mouth.     No current facility-administered medications for this visit.    No Known Allergies  Family History  Problem Relation Age of Onset   Hypertension Mother        alive   Arthritis Mother    Lung cancer  Father        deceased   Heart disease Father    Other Other        1 brother and 1 sister both alive an healthy   Hypertension Maternal Grandmother     Social History   Socioeconomic History   Marital status: Widowed    Spouse name: Not on file   Number of children: 3   Years of education: Not on file   Highest education level: Not on file  Occupational History   Occupation: customer service    Employer: [employer]  Tobacco Use   Smoking status: Former    Types: Cigarettes    Quit date: 04/20/1985    Years since quitting: 35.8   Smokeless tobacco: Never   Tobacco comments:    remote tobacco use   Vaping Use   Vaping Use: Never used  Substance and Sexual Activity   Alcohol use: No    Alcohol/week: 0.0 standard drinks   Drug use: No   Sexual activity: Never  Other Topics Concern   Not on file  Social History Narrative   Not on file   Social Determinants of Health   Financial Resource Strain: Not on file  Food Insecurity: Not on file  Transportation  Needs: Not on file  Physical Activity: Not on file  Stress: Not on file  Social Connections: Not on file  Intimate Partner Violence: Not on file     Constitutional: Denies fever, malaise, fatigue, headache or abrupt weight changes.  Respiratory: Denies difficulty breathing, shortness of breath, cough or sputum production.   Cardiovascular: Denies chest pain, chest tightness, palpitations or swelling in the hands or feet.  Gastrointestinal: Denies abdominal pain, bloating, constipation, diarrhea or blood in the stool.  GU: Denies urgency, frequency, pain with urination, burning sensation, blood in urine, odor or discharge. Skin: Pt reports labial lesions. Denies redness, rashes, or ulcercations.   No other specific complaints in a complete review of systems (except as listed in HPI above).     Objective:   Physical Exam BP 129/74 (BP Location: Right Arm, Patient Position: Sitting, Cuff Size: Large)   Pulse 77    Temp (!) 97.5 F (36.4 C) (Temporal)   Resp 17   Ht 5\' 7"  (1.702 m)   Wt 215 lb 9.6 oz (97.8 kg)   LMP 01/20/2016   SpO2 100%   BMI 33.77 kg/m   Wt Readings from Last 3 Encounters:  01/28/21 213 lb 3.2 oz (96.7 kg)  01/23/21 214 lb (97.1 kg)  01/02/21 214 lb (97.1 kg)    General: Appears her stated age, obese, in NAD. Skin: 3 mm raised rough lesion of the right labia. 2 34mm raised rough lesions noted of the left labia. Cardiovascular: Normal rate and rhythm.  Pulmonary/Chest: Normal effort. Neurological: Alert and oriented.   BMET    Component Value Date/Time   NA 140 09/19/2019 1554   K 3.7 09/19/2019 1554   CL 101 09/19/2019 1554   CO2 33 (H) 09/19/2019 1554   GLUCOSE 106 (H) 09/19/2019 1554   BUN 15 09/19/2019 1554   CREATININE 0.66 09/19/2019 1554   CALCIUM 9.7 09/19/2019 1554   GFRNONAA >60 07/27/2017 2153   GFRAA >60 07/27/2017 2153    Lipid Panel     Component Value Date/Time   CHOL 193 02/09/2019 1245   TRIG 105.0 02/09/2019 1245   HDL 18.00 (L) 02/09/2019 1245   CHOLHDL 11 02/09/2019 1245   VLDL 21.0 02/09/2019 1245   LDLCALC 154 (H) 02/09/2019 1245    CBC    Component Value Date/Time   WBC 4.7 09/19/2019 1554   RBC 4.59 09/19/2019 1554   HGB 13.9 09/19/2019 1554   HCT 41.1 09/19/2019 1554   PLT 222.0 09/19/2019 1554   MCV 89.5 09/19/2019 1554   MCH 29.7 07/27/2017 1023   MCHC 33.8 09/19/2019 1554   RDW 14.4 09/19/2019 1554   LYMPHSABS 1.1 07/28/2016 1232   MONOABS 0.4 07/28/2016 1232   EOSABS 0.0 07/28/2016 1232   BASOSABS 0.0 07/28/2016 1232    Hgb A1C Lab Results  Component Value Date   HGBA1C 5.9 02/09/2019         Assessment & Plan:   Labial Lesion, Genital Warts:  Reviewed labs and what they mean with patient RX for Imiquimod 3.75% daily x 8 weeks    Return precautions discussed 02/11/2019, NP This visit occurred during the SARS-CoV-2 public health emergency.  Safety protocols were in place, including screening  questions prior to the visit, additional usage of staff PPE, and extensive cleaning of exam room while observing appropriate contact time as indicated for disinfecting solutions.

## 2021-02-07 ENCOUNTER — Encounter: Payer: Self-pay | Admitting: Internal Medicine

## 2021-02-07 DIAGNOSIS — L300 Nummular dermatitis: Secondary | ICD-10-CM | POA: Diagnosis not present

## 2021-02-07 DIAGNOSIS — L821 Other seborrheic keratosis: Secondary | ICD-10-CM | POA: Diagnosis not present

## 2021-02-07 LAB — COMPREHENSIVE METABOLIC PANEL
AG Ratio: 1.9 (calc) (ref 1.0–2.5)
ALT: 10 U/L (ref 6–29)
AST: 11 U/L (ref 10–35)
Albumin: 4.3 g/dL (ref 3.6–5.1)
Alkaline phosphatase (APISO): 65 U/L (ref 37–153)
BUN: 12 mg/dL (ref 7–25)
CO2: 32 mmol/L (ref 20–32)
Calcium: 9.5 mg/dL (ref 8.6–10.4)
Chloride: 101 mmol/L (ref 98–110)
Creat: 0.59 mg/dL (ref 0.50–1.03)
Globulin: 2.3 g/dL (calc) (ref 1.9–3.7)
Glucose, Bld: 156 mg/dL — ABNORMAL HIGH (ref 65–139)
Potassium: 3.5 mmol/L (ref 3.5–5.3)
Sodium: 142 mmol/L (ref 135–146)
Total Bilirubin: 1.7 mg/dL — ABNORMAL HIGH (ref 0.2–1.2)
Total Protein: 6.6 g/dL (ref 6.1–8.1)

## 2021-02-18 ENCOUNTER — Other Ambulatory Visit: Payer: Self-pay

## 2021-02-18 ENCOUNTER — Ambulatory Visit (INDEPENDENT_AMBULATORY_CARE_PROVIDER_SITE_OTHER): Payer: BC Managed Care – PPO | Admitting: Internal Medicine

## 2021-02-18 ENCOUNTER — Encounter: Payer: Self-pay | Admitting: Internal Medicine

## 2021-02-18 VITALS — BP 136/83 | HR 73 | Temp 97.5°F | Resp 18 | Ht 67.0 in | Wt 216.4 lb

## 2021-02-18 DIAGNOSIS — Z1231 Encounter for screening mammogram for malignant neoplasm of breast: Secondary | ICD-10-CM

## 2021-02-18 DIAGNOSIS — Z0001 Encounter for general adult medical examination with abnormal findings: Secondary | ICD-10-CM

## 2021-02-18 DIAGNOSIS — E6609 Other obesity due to excess calories: Secondary | ICD-10-CM

## 2021-02-18 DIAGNOSIS — Z780 Asymptomatic menopausal state: Secondary | ICD-10-CM | POA: Diagnosis not present

## 2021-02-18 DIAGNOSIS — Z6833 Body mass index (BMI) 33.0-33.9, adult: Secondary | ICD-10-CM

## 2021-02-18 DIAGNOSIS — R7303 Prediabetes: Secondary | ICD-10-CM | POA: Diagnosis not present

## 2021-02-18 NOTE — Progress Notes (Signed)
Subjective:    Patient ID: Diana [name], female    DOB: [birth year], 60 y.o.   MRN: [id number]  HPI  Patient presents the clinic today for her annual exam.  Flu: never Tetanus: 03/2015 COVID: never Shingrix: never Pap smear: hysterectomy Mammogram: > 5 years ago Bone density: never Colon screening: > 10 years ago Vision screening: annually Dentist: biannually  Diet: She does eat meat. She eats some veggies, no fruits. She does eat some fried foods. She drinks mostly water. Exercise: None  Review of Systems     Past Medical History:  Diagnosis Date   Allergy    Ear infection    recurrent    Current Outpatient Medications  Medication Sig Dispense Refill   Ascorbic Acid 125 MG CHEW Chew by mouth.     cetirizine (ZYRTEC) 10 MG tablet Take 10 mg by mouth daily.     Cholecalciferol (VITAMIN D3) 25 MCG (1000 UT) CAPS Take 5,000 Units by mouth.      clonazePAM (KLONOPIN) 0.5 MG tablet TAKE 1 TABLET BY MOUTH DAILY AS NEEDED FOR ANXIETY 30 tablet 0   hydrochlorothiazide (MICROZIDE) 12.5 MG capsule TAKE 1 CAPSULE BY MOUTH DAILY. MUST SCHEDULE PHYSICAL EXAM 90 capsule 1   hydrocortisone (ANUSOL-HC) 2.5 % rectal cream Place 1 application rectally 2 (two) times daily. 30 g 0   hydrocortisone (ANUSOL-HC) 25 MG suppository Place 1 suppository (25 mg total) rectally 2 (two) times daily. 12 suppository 0   Imiquimod 3.75 % CREA Apply 1 application topically at bedtime. 7.5 g 0   omeprazole (PRILOSEC) 40 MG capsule Take 1 capsule (40 mg total) by mouth daily. 90 capsule 1   triamcinolone cream (KENALOG) 0.1 % Apply 1 application topically 2 (two) times daily. 30 g 0   Zinc 50 MG CAPS Take by mouth.     No current facility-administered medications for this visit.    No Known Allergies  Family History  Problem Relation Age of Onset   Hypertension Mother        alive   Arthritis Mother    Lung cancer Father        deceased   Heart disease Father    Other Other        1  brother and 1 sister both alive an healthy   Hypertension Maternal Grandmother     Social History   Socioeconomic History   Marital status: Widowed    Spouse name: Not on file   Number of children: 3   Years of education: Not on file   Highest education level: Not on file  Occupational History   Occupation: customer service    Employer: [employer]  Tobacco Use   Smoking status: Former    Types: Cigarettes    Quit date: 04/20/1985    Years since quitting: 35.8   Smokeless tobacco: Never   Tobacco comments:    remote tobacco use   Vaping Use   Vaping Use: Never used  Substance and Sexual Activity   Alcohol use: No    Alcohol/week: 0.0 standard drinks   Drug use: No   Sexual activity: Never  Other Topics Concern   Not on file  Social History Narrative   Not on file   Social Determinants of Health   Financial Resource Strain: Not on file  Food Insecurity: Not on file  Transportation Needs: Not on file  Physical Activity: Not on file  Stress: Not on file  Social Connections: Not on file  Intimate Partner Violence: Not on file     Constitutional: Denies fever, malaise, fatigue, headache or abrupt weight changes.  HEENT: Denies eye pain, eye redness, ear pain, ringing in the ears, wax buildup, runny nose, nasal congestion, bloody nose, or sore throat. Respiratory: Denies difficulty breathing, shortness of breath, cough or sputum production.   Cardiovascular: Denies chest pain, chest tightness, palpitations or swelling in the hands or feet.  Gastrointestinal: Patient reports intermittent constipation.  Denies abdominal pain, bloating, \diarrhea or blood in the stool.  GU: Denies urgency, frequency, pain with urination, burning sensation, blood in urine, odor or discharge. Musculoskeletal: Patient reports intermittent low back pain.  Denies decrease in range of motion, difficulty with gait, muscle pain or joint swelling.  Skin: Denies redness, rashes, lesions or  ulcercations.  Neurological: Denies dizziness, difficulty with memory, difficulty with speech or problems with balance and coordination.  Psych: Patient has a history of anxiety and depression.  Denies SI/HI.  No other specific complaints in a complete review of systems (except as listed in HPI above).  Objective:   Physical Exam BP 136/83 (BP Location: Right Arm, Patient Position: Sitting, Cuff Size: Large)   Pulse 73   Temp (!) 97.5 F (36.4 C) (Temporal)   Resp 18   Ht 5\' 7"  (1.702 m)   Wt 216 lb 6.4 oz (98.2 kg)   LMP 01/20/2016   SpO2 100%   BMI 33.89 kg/m   Wt Readings from Last 3 Encounters:  02/06/21 215 lb 9.6 oz (97.8 kg)  01/28/21 213 lb 3.2 oz (96.7 kg)  01/23/21 214 lb (97.1 kg)    General: Appears her stated age, obese, in NAD. Skin: Warm, dry and intact.  HEENT: Head: normal shape and size; Eyes: EOMs intact;  Neck:  Neck supple, trachea midline. No masses, lumps or thyromegaly present.  Cardiovascular: Normal rate and rhythm. S1,S2 noted.  No murmur, rubs or gallops noted. No JVD or BLE edema. No carotid bruits noted. Pulmonary/Chest: Normal effort and positive vesicular breath sounds. No respiratory distress. No wheezes, rales or ronchi noted.  Abdomen: Soft and nontender. Normal bowel sounds. No distention or masses noted. Liver, spleen and kidneys non palpable. Musculoskeletal: Strength 5/5 BUE/BLE. No difficulty with gait.  Neurological: Alert and oriented. Cranial nerves II-XII grossly intact. Coordination normal.  Psychiatric: Mood and affect normal. Behavior is normal. Judgment and thought content normal.    BMET    Component Value Date/Time   NA 142 02/06/2021 0941   K 3.5 02/06/2021 0941   CL 101 02/06/2021 0941   CO2 32 02/06/2021 0941   GLUCOSE 156 (H) 02/06/2021 0941   BUN 12 02/06/2021 0941   CREATININE 0.59 02/06/2021 0941   CALCIUM 9.5 02/06/2021 0941   GFRNONAA >60 07/27/2017 2153   GFRAA >60 07/27/2017 2153    Lipid Panel      Component Value Date/Time   CHOL 193 02/09/2019 1245   TRIG 105.0 02/09/2019 1245   HDL 18.00 (L) 02/09/2019 1245   CHOLHDL 11 02/09/2019 1245   VLDL 21.0 02/09/2019 1245   LDLCALC 154 (H) 02/09/2019 1245    CBC    Component Value Date/Time   WBC 4.7 09/19/2019 1554   RBC 4.59 09/19/2019 1554   HGB 13.9 09/19/2019 1554   HCT 41.1 09/19/2019 1554   PLT 222.0 09/19/2019 1554   MCV 89.5 09/19/2019 1554   MCH 29.7 07/27/2017 1023   MCHC 33.8 09/19/2019 1554   RDW 14.4 09/19/2019 1554   LYMPHSABS 1.1 07/28/2016 1232  MONOABS 0.4 07/28/2016 1232   EOSABS 0.0 07/28/2016 1232   BASOSABS 0.0 07/28/2016 1232    Hgb A1C Lab Results  Component Value Date   HGBA1C 5.9 02/09/2019            Assessment & Plan:   Preventative Health Maintenance:  She declines flu shot Tetanus UTD Encouraged her to get her COVID-vaccine Discussed Shingrix vaccine, she will check coverage with her insurance provider She no longer needs Pap smears Mammogram and bone density ordered She will talk to her GI about screening colonoscopy, has an appt next week Encouraged her to consume a balanced diet and exercise regimen Advised her to see an eye doctor and dentist annually We will check CBC, lipid, A1c today  RTC in 6 months, follow-up chronic conditions  [name], NP This visit occurred during the SARS-CoV-2 public health emergency.  Safety protocols were in place, including screening questions prior to the visit, additional usage of staff PPE, and extensive cleaning of exam room while observing appropriate contact time as indicated for disinfecting solutions.

## 2021-02-18 NOTE — Assessment & Plan Note (Signed)
Encouraged diet and exercise for weight loss ?

## 2021-02-19 LAB — LIPID PANEL
Cholesterol: 171 mg/dL (ref <200)
HDL: 27 mg/dL — ABNORMAL LOW (ref >=50)
LDL Cholesterol (Calc): 128 mg/dL (calc) — ABNORMAL HIGH
Non-HDL Cholesterol (Calc): 144 mg/dL (calc) — ABNORMAL HIGH (ref <130)
Total CHOL/HDL Ratio: 6.3 (calc) — ABNORMAL HIGH (ref <5.0)
Triglycerides: 68 mg/dL (ref <150)

## 2021-02-19 LAB — HEMOGLOBIN A1C
Hgb A1c MFr Bld: 5.9 % of total Hgb — ABNORMAL HIGH (ref <5.7)
Mean Plasma Glucose: 123 mg/dL
eAG (mmol/L): 6.8 mmol/L

## 2021-02-19 LAB — CBC
HCT: 43.3 % (ref 35.0–45.0)
Hemoglobin: 13.9 g/dL (ref 11.7–15.5)
MCH: 30 pg (ref 27.0–33.0)
MCHC: 32.1 g/dL (ref 32.0–36.0)
MCV: 93.5 fL (ref 80.0–100.0)
MPV: 11.3 fL (ref 7.5–12.5)
Platelets: 228 10*3/uL (ref 140–400)
RBC: 4.63 10*6/uL (ref 3.80–5.10)
RDW: 13 % (ref 11.0–15.0)
WBC: 5.2 10*3/uL (ref 3.8–10.8)

## 2021-02-24 ENCOUNTER — Other Ambulatory Visit: Payer: Self-pay | Admitting: Internal Medicine

## 2021-02-24 DIAGNOSIS — I10 Essential (primary) hypertension: Secondary | ICD-10-CM

## 2021-02-24 NOTE — Telephone Encounter (Signed)
Requested Prescriptions  Pending Prescriptions Disp Refills  . hydrochlorothiazide (MICROZIDE) 12.5 MG capsule [Pharmacy Med Name: HYDROCHLOROTHIAZIDE 12.5 MG 12.5 Capsule] 90 capsule 1    Sig: TAKE 1 CAPSULE BY MOUTH DAILY. MUST SCHEDULE PHYSICAL EXAM     Cardiovascular: Diuretics - Thiazide Passed - 02/24/2021  1:12 PM      Passed - Ca in normal range and within 360 days    Calcium  Date Value Ref Range Status  02/06/2021 9.5 8.6 - 10.4 mg/dL Final         Passed - Cr in normal range and within 360 days    Creat  Date Value Ref Range Status  02/06/2021 0.59 0.50 - 1.03 mg/dL Final         Passed - K in normal range and within 360 days    Potassium  Date Value Ref Range Status  02/06/2021 3.5 3.5 - 5.3 mmol/L Final         Passed - Na in normal range and within 360 days    Sodium  Date Value Ref Range Status  02/06/2021 142 135 - 146 mmol/L Final         Passed - Last BP in normal range    BP Readings from Last 1 Encounters:  02/18/21 136/83         Passed - Valid encounter within last 6 months    Recent Outpatient Visits          6 days ago Encounter for general adult medical examination with abnormal findings   [facility] [place], Salvadore Oxford, NP   2 weeks ago Genital warts   [facility] [place], Salvadore Oxford, NP   3 weeks ago Labial lesion   [facility] [place], Salvadore Oxford, NP   1 month ago Urinary urgency   [facility] [place], Salvadore Oxford, NP   1 month ago Viral sinusitis   [facility] [place], Salvadore Oxford, NP      Future Appointments            In 1 week Vanga, Loel Dubonnet, MD [facility] [place]

## 2021-03-04 ENCOUNTER — Telehealth: Payer: Self-pay

## 2021-03-04 ENCOUNTER — Ambulatory Visit (INDEPENDENT_AMBULATORY_CARE_PROVIDER_SITE_OTHER): Payer: BC Managed Care – PPO | Admitting: Gastroenterology

## 2021-03-04 ENCOUNTER — Encounter: Payer: Self-pay | Admitting: Gastroenterology

## 2021-03-04 ENCOUNTER — Telehealth: Payer: Self-pay | Admitting: Gastroenterology

## 2021-03-04 ENCOUNTER — Ambulatory Visit: Payer: Self-pay | Admitting: *Deleted

## 2021-03-04 VITALS — BP 160/91 | HR 69 | Temp 97.6°F | Ht 67.0 in | Wt 217.4 lb

## 2021-03-04 DIAGNOSIS — Z1211 Encounter for screening for malignant neoplasm of colon: Secondary | ICD-10-CM | POA: Diagnosis not present

## 2021-03-04 DIAGNOSIS — B181 Chronic viral hepatitis B without delta-agent: Secondary | ICD-10-CM

## 2021-03-04 DIAGNOSIS — K760 Fatty (change of) liver, not elsewhere classified: Secondary | ICD-10-CM

## 2021-03-04 MED ORDER — PEG 3350-KCL-NA BICARB-NACL 420 G PO SOLR: 4000.0000 mL | Freq: Once | ORAL | 0 refills | Status: AC

## 2021-03-04 NOTE — Telephone Encounter (Signed)
The pt was scheduled for an appt this Thursday at 11:20am.

## 2021-03-04 NOTE — Telephone Encounter (Signed)
Inbound call from pt requesting a call back that she needs to resch her appt for her ultrasound. Pt prefers a Monday when she is off work.

## 2021-03-04 NOTE — Telephone Encounter (Signed)
C/o elevated BP today during GI office visit. BP 160/91 with mild headache. Patient reports she does not have a BP monitor but thinks she can get someone to check it she knows. C/o mild headaches since last OV 02/18/21. Denies other symptoms of chest pain, difficulty breathing, blurred vision no weakness of either side or N/T. Speech clear and no deficits with balance or walking. Please advise. Earliest appt available and scheduled 03/17/21. Recommended patient rest , increase water intake and get friend to recheck BP. Care advise given. Patient verbalized understanding of care advise and to call back or go to [facility] or ED if symptoms worsen.

## 2021-03-04 NOTE — Progress Notes (Signed)
[name], MD [address]  [address]  [place], [place] [address]  Main: [phone]  Fax: [phone]    Gastroenterology Consultation  Referring Provider:     [name], NP Primary Care Physician:  [name], NP Primary Gastroenterologist:  Dr. [name] Reason for Consultation:  Chronic hepatitis B infection, fatty liver, colon cancer screening        HPI:   DANETTE WEINFELD is a 60 y.o. female referred by Dr. Sampson Si, Salvadore Oxford, NP  for consultation & management of chronic hepatitis B infection.Ms. Winiarski has history of hepatitis B infection, treated with entecavir with no detection of active infection for last 2 years, E antigen negative, E antibody positive.   And, viral load is undetectable most recently in 11/2019. She does have fatty liver. She denies any GI symptoms today. LFTs are normal except for indirect hyperbilirubinemia.  Patient did not show for her screening colonoscopy last year.  She reports intermittent constipation.  She does have flareup of hemorrhoids, mostly swelling and discomfort.  She has been gaining weight  She is also overdue for colon cancer screening  NSAIDs: she recent longer taking NSAIDs  Antiplts/Anticoagulants/Anti thrombotics: none  GI Procedures: none She denies family history of colon cancer  Ultrasound-guided liver biopsy 07/27/2017 DIAGNOSIS:  A. LIVER; ULTRASOUND-GUIDED CORE BIOPSY:  - HEPATITIC PROCESS WITH MILD TO MODERATE PORTAL AND LOBULAR  INFLAMMATION, CONSISTENT WITH VIRAL HEPATITIS B, SEE COMMENT.  - NEGATIVE FOR FIBROSIS.  - NEGATIVE FOR STEATOSIS.   Past Medical History:  Diagnosis Date   Allergy    Ear infection    recurrent    Past Surgical History:  Procedure Laterality Date   ABDOMINAL HYSTERECTOMY  2019   partial   TONSILLECTOMY     TUBAL LIGATION     bilateral     Current Outpatient Medications:    Ascorbic Acid 125 MG CHEW, Chew by mouth., Disp: , Rfl:    cetirizine  (ZYRTEC) 10 MG tablet, Take 10 mg by mouth daily., Disp: , Rfl:    Cholecalciferol (VITAMIN D3) 25 MCG (1000 UT) CAPS, Take 5,000 Units by mouth. , Disp: , Rfl:    clonazePAM (KLONOPIN) 0.5 MG tablet, TAKE 1 TABLET BY MOUTH DAILY AS NEEDED FOR ANXIETY, Disp: 30 tablet, Rfl: 0   hydrochlorothiazide (MICROZIDE) 12.5 MG capsule, TAKE 1 CAPSULE BY MOUTH DAILY. MUST SCHEDULE PHYSICAL EXAM, Disp: 90 capsule, Rfl: 1   hydrocortisone (ANUSOL-HC) 2.5 % rectal cream, Place 1 application rectally 2 (two) times daily., Disp: 30 g, Rfl: 0   omeprazole (PRILOSEC) 40 MG capsule, Take 1 capsule (40 mg total) by mouth daily., Disp: 90 capsule, Rfl: 1   polyethylene glycol-electrolytes (NULYTELY) 420 g solution, Take 4,000 mLs by mouth once for 1 dose., Disp: 4000 mL, Rfl: 0   triamcinolone cream (KENALOG) 0.1 %, Apply 1 application topically 2 (two) times daily., Disp: 30 g, Rfl: 0   Zinc 50 MG CAPS, Take by mouth., Disp: , Rfl:    Family History  Problem Relation Age of Onset   Hypertension Mother        alive   Arthritis Mother    Lung cancer Father        deceased   Heart disease Father    Other Other        1 brother and 1 sister both alive an healthy   Hypertension Maternal Grandmother      Social History   Tobacco Use  Smoking status: Former    Types: Cigarettes    Quit date: 04/20/1985    Years since quitting: 35.8   Smokeless tobacco: Never   Tobacco comments:    remote tobacco use   Vaping Use   Vaping Use: Never used  Substance Use Topics   Alcohol use: No    Alcohol/week: 0.0 standard drinks   Drug use: No    Allergies as of 03/04/2021   (No Known Allergies)    Review of Systems:    All systems reviewed and negative except where noted in HPI.   Physical Exam:  BP (!) 160/91 (BP Location: Left Arm, Patient Position: Sitting, Cuff Size: Normal)   Pulse 69   Temp 97.6 F (36.4 C) (Oral)   Ht 5\' 7"  (1.702 m)   Wt 217 lb 6.4 oz (98.6 kg)   LMP 01/20/2016   BMI 34.05 kg/m   Patient's last menstrual period was 01/20/2016.  General:   Alert,  Well-developed, well-nourished, pleasant and cooperative in NAD Head:  Normocephalic and atraumatic. Eyes:  Sclera clear, no icterus.   Conjunctiva pink. Ears:  Normal auditory acuity. Nose:  No deformity, discharge, or lesions. Mouth:  No deformity or lesions,oropharynx pink & moist. Neck:  Supple; no masses or thyromegaly. Lungs:  Respirations even and unlabored.  Clear throughout to auscultation.   No wheezes, crackles, or rhonchi. No acute distress. Heart:  Regular rate and rhythm; no murmurs, clicks, rubs, or gallops. Abdomen:  Normal bowel sounds. Soft, obese, non-tender and non-distended without masses, hepatosplenomegaly or hernias noted.  No guarding or rebound tenderness.   Rectal: Perianal skin tags and slightly prolapsed right posterior external hemorrhoid, nontender Msk:  Symmetrical without gross deformities. Good, equal movement & strength bilaterally. Pulses:  Normal pulses noted. Extremities:  No clubbing or edema.  No cyanosis. Neurologic:  Alert and oriented x3;  grossly normal neurologically. Skin:  Intact without significant lesions or rashes. No jaundice. Psych:  Alert and cooperative. Normal mood and affect.  Imaging Studies: Last ultrasound abdomen in 05/2015 which was unremarkable  Ultrasound abdomen 06/2017 unremarkable with no evidence of cirrhosis and no portal hypertension  Assessment and Plan:   INDIGO BARBIAN is a 60 y.o. female with obesity, hypertension, GERD is here for follow-up of chronic hepatitis B infection. Her hepatitis D antigen is negative. She does not have clinical signs or symptoms to suggest chronic liver disease. There is no evidence of portal hypertension. Secondary liver disease workup revealed weakly positive anti-smooth muscle antibodies. She does not have hep C. Status post liver biopsy.  Patient did not tolerate viread, developed stomach upset and myalgias.  Therefore,  treated with entecavir which was stopped in 07/2019.  Chronic hepatitis B infection, currently seroconverted, E antigen negative, E antibody positive and HBV DNA undetected as of 11/2019 - Okay to hold off on antiviral therapy at this time - Recheck LFTs, HBV DNA today every 6 months - Hepatitis B e antigen, hepatitis B e antibody today then every 6 months - HBsAg annually - to avoid alcohol use, limit intake of NSAIDs and Tylenol use for arthralgias - Recommend right upper quadrant ultrasound for follow-up of fatty liver  Colon cancer screening: overdue Average risk, schedule colonoscopy I have discussed alternative options, risks & benefits,  which include, but are not limited to, bleeding, infection, perforation,respiratory complication & drug reaction.  The patient agrees with this plan & written consent will be obtained.    Follow up every 12 months   Melody Cirrincione R  Marius Ditch, MD

## 2021-03-04 NOTE — Telephone Encounter (Signed)
Reason for Disposition  [1] Systolic BP  >= 130 OR Diastolic >= 80 AND [2] taking BP medications  Answer Assessment - Initial Assessment Questions 1. BLOOD PRESSURE: "What is the blood pressure?" "Did you take at least two measurements 5 minutes apart?"     160/91 at GI appt today  2. ONSET: "When did you take your blood pressure?"     At GI appt today  3. HOW: "How did you obtain the blood pressure?" (e.g., visiting nurse, automatic home BP monitor)     Nurse in GI dr office  4. HISTORY: "Do you have a history of high blood pressure?"     yes 5. MEDICATIONS: "Are you taking any medications for blood pressure?" "Have you missed any doses recently?"     Yes microzide has not missed any doses 6. OTHER SYMPTOMS: "Do you have any symptoms?" (e.g., headache, chest pain, blurred vision, difficulty breathing, weakness)     Mild Headache  7. PREGNANCY: "Is there any chance you are pregnant?" "When was your last menstrual period?"     na  Protocols used: Blood Pressure - High-A-AH

## 2021-03-04 NOTE — Telephone Encounter (Signed)
Schedule ultrasound at [facility] [phone] nothing to eat after midnight on wed your appointment is 03/06/2021 at 8:30am called patient to let her know about appointment left voicemail on approved number

## 2021-03-04 NOTE — Telephone Encounter (Signed)
Gave patient central scheduling so she could reschedule her ultrasound to her needs

## 2021-03-06 ENCOUNTER — Encounter: Payer: Self-pay | Admitting: Internal Medicine

## 2021-03-06 ENCOUNTER — Ambulatory Visit: Payer: BC Managed Care – PPO | Admitting: Internal Medicine

## 2021-03-06 ENCOUNTER — Other Ambulatory Visit: Payer: Self-pay

## 2021-03-06 ENCOUNTER — Ambulatory Visit: Admission: RE | Admit: 2021-03-06 | Payer: BC Managed Care – PPO | Source: Ambulatory Visit

## 2021-03-06 DIAGNOSIS — E6609 Other obesity due to excess calories: Secondary | ICD-10-CM

## 2021-03-06 DIAGNOSIS — Z6833 Body mass index (BMI) 33.0-33.9, adult: Secondary | ICD-10-CM

## 2021-03-06 DIAGNOSIS — K219 Gastro-esophageal reflux disease without esophagitis: Secondary | ICD-10-CM

## 2021-03-06 DIAGNOSIS — I10 Essential (primary) hypertension: Secondary | ICD-10-CM | POA: Diagnosis not present

## 2021-03-06 LAB — HEPATITIS B E ANTIBODY: Hep B E Ab: POSITIVE — AB

## 2021-03-06 LAB — HEPATITIS B DNA, ULTRAQUANTITATIVE, PCR: HBV DNA SERPL PCR-ACNC: NOT DETECTED IU/mL

## 2021-03-06 LAB — HEPATITIS B E ANTIGEN: Hep B E Ag: NEGATIVE

## 2021-03-06 LAB — HEPATITIS B SURFACE ANTIGEN: Hepatitis B Surface Ag: NEGATIVE

## 2021-03-06 MED ORDER — LISINOPRIL-HYDROCHLOROTHIAZIDE 10-12.5 MG PO TABS: 1.0000 | ORAL_TABLET | Freq: Every day | ORAL | 0 refills | Status: DC

## 2021-03-06 NOTE — Assessment & Plan Note (Signed)
Increase Omeprazole to 40 mg 2 times daily

## 2021-03-06 NOTE — Assessment & Plan Note (Signed)
Slightly elevated today Will change HCTZ to Lisinopril HCT Reinforced DASH diet and exercise weight loss We will check c-Met in 2 weeks

## 2021-03-06 NOTE — Progress Notes (Signed)
Subjective:    Patient ID: Diana [name], female    DOB: 07-25-1960, 60 y.o.   MRN: [id number]  HPI  Patient presents the clinic today with concern for elevated blood pressures.  She was seen at the GI doctor 2 days ago.  Her BP at that time was 160/91.  Her BP today is 135/73.  She has been on antihypertensive medication in the past but was taken off secondary to weight loss.  She reports she has been having frequent headaches all over.  This has been going on for a few weeks now.  She has trouble describing the pain but reports ports her she also reports a ringing in her ears.  She denies runny nose, nasal congestion or ear pain.  She also reports intermittent sore throat and cough.  The sore throat alternates from side to side.  The cough is worse when she lays down at night.  She denies hoarseness, difficulty swallowing or shortness of breath.  She reports she has having bilateral rib pain secondary to the cough.  She does have a history of reflux and is taking  Omeprazole daily.  She also reports fatigue.  This has been an ongoing issue.  She reports she is so tired in the evenings after work that she will sometimes come home and lay down.  She reports she is sleeping well at night.  She does feel rested when she wakes up.  She does not snore.  She has had a normal thyroid function test in the past.  She has not had her vitamin D checked in a few years.  She does not exercise.  Fatigue  Review of Systems     Past Medical History:  Diagnosis Date   Allergy    Ear infection    recurrent    Current Outpatient Medications  Medication Sig Dispense Refill   Ascorbic Acid 125 MG CHEW Chew by mouth.     cetirizine (ZYRTEC) 10 MG tablet Take 10 mg by mouth daily.     Cholecalciferol (VITAMIN D3) 25 MCG (1000 UT) CAPS Take 5,000 Units by mouth.      clonazePAM (KLONOPIN) 0.5 MG tablet TAKE 1 TABLET BY MOUTH DAILY AS NEEDED FOR ANXIETY 30 tablet 0   hydrochlorothiazide (MICROZIDE) 12.5 MG  capsule TAKE 1 CAPSULE BY MOUTH DAILY. MUST SCHEDULE PHYSICAL EXAM 90 capsule 1   hydrocortisone (ANUSOL-HC) 2.5 % rectal cream Place 1 application rectally 2 (two) times daily. 30 g 0   omeprazole (PRILOSEC) 40 MG capsule Take 1 capsule (40 mg total) by mouth daily. 90 capsule 1   triamcinolone cream (KENALOG) 0.1 % Apply 1 application topically 2 (two) times daily. 30 g 0   Zinc 50 MG CAPS Take by mouth.     No current facility-administered medications for this visit.    No Known Allergies  Family History  Problem Relation Age of Onset   Hypertension Mother        alive   Arthritis Mother    Lung cancer Father        deceased   Heart disease Father    Other Other        1 brother and 1 sister both alive an healthy   Hypertension Maternal Grandmother     Social History   Socioeconomic History   Marital status: Widowed    Spouse name: Not on file   Number of children: 3   Years of education: Not on file   Highest education  level: Not on file  Occupational History   Occupation: customer service    Employer: [employer]  Tobacco Use   Smoking status: Former    Types: Cigarettes    Quit date: 04/20/1985    Years since quitting: 35.9   Smokeless tobacco: Never   Tobacco comments:    remote tobacco use   Vaping Use   Vaping Use: Never used  Substance and Sexual Activity   Alcohol use: No    Alcohol/week: 0.0 standard drinks   Drug use: No   Sexual activity: Never  Other Topics Concern   Not on file  Social History Narrative   Not on file   Social Determinants of Health   Financial Resource Strain: Not on file  Food Insecurity: Not on file  Transportation Needs: Not on file  Physical Activity: Not on file  Stress: Not on file  Social Connections: Not on file  Intimate Partner Violence: Not on file     Constitutional: Patient reports fatigue and headaches.  Denies fever, malaise, or abrupt weight changes.  HEENT: Patient reports ringing in her ears,  intermittent sore throat.  Denies eye pain, eye redness, ear pain, wax buildup, runny nose, nasal congestion, bloody nose. Respiratory: Patient reports nocturnal cough.  Denies difficulty breathing, shortness of breath, or sputum production.   Cardiovascular: Denies chest pain, chest tightness, palpitations or swelling in the hands or feet.  Gastrointestinal: Denies abdominal pain, bloating, constipation, diarrhea or blood in the stool.  Neurological: Denies dizziness, difficulty with memory, difficulty with speech or problems with balance and coordination.    No other specific complaints in a complete review of systems (except as listed in HPI above).  Objective:   Physical Exam  BP 135/73 (BP Location: Right Arm, Patient Position: Sitting, Cuff Size: Large)   Pulse 70   Temp 97.7 F (36.5 C) (Temporal)   Ht 5\' 7"  (1.702 m)   Wt 216 lb 9.6 oz (98.2 kg)   LMP 01/20/2016   SpO2 99%   BMI 33.92 kg/m  Wt Readings from Last 3 Encounters:  03/06/21 216 lb 9.6 oz (98.2 kg)  03/04/21 217 lb 6.4 oz (98.6 kg)  02/18/21 216 lb 6.4 oz (98.2 kg)    General: Appears her stated age, obese, in NAD. HEENT: Head: normal shape and size; Eyes:  EOMs intact;  Cardiovascular: Normal rate and rhythm. Pulmonary/Chest: Normal effort and positive vesicular breath sounds.  Neurological: Alert and oriented.   BMET    Component Value Date/Time   NA 142 02/06/2021 0941   K 3.5 02/06/2021 0941   CL 101 02/06/2021 0941   CO2 32 02/06/2021 0941   GLUCOSE 156 (H) 02/06/2021 0941   BUN 12 02/06/2021 0941   CREATININE 0.59 02/06/2021 0941   CALCIUM 9.5 02/06/2021 0941   GFRNONAA >60 07/27/2017 2153   GFRAA >60 07/27/2017 2153    Lipid Panel     Component Value Date/Time   CHOL 171 02/18/2021 1016   TRIG 68 02/18/2021 1016   HDL 27 (L) 02/18/2021 1016   CHOLHDL 6.3 (H) 02/18/2021 1016   VLDL 21.0 02/09/2019 1245   LDLCALC 128 (H) 02/18/2021 1016    CBC    Component Value Date/Time   WBC  5.2 02/18/2021 1016   RBC 4.63 02/18/2021 1016   HGB 13.9 02/18/2021 1016   HCT 43.3 02/18/2021 1016   PLT 228 02/18/2021 1016   MCV 93.5 02/18/2021 1016   MCH 30.0 02/18/2021 1016   MCHC 32.1 02/18/2021  1016   RDW 13.0 02/18/2021 1016   LYMPHSABS 1.1 07/28/2016 1232   MONOABS 0.4 07/28/2016 1232   EOSABS 0.0 07/28/2016 1232   BASOSABS 0.0 07/28/2016 1232    Hgb A1C Lab Results  Component Value Date   HGBA1C 5.9 (H) 02/18/2021            Assessment & Plan:   Intermittent Sore Throat, Cough:  We will try to increase her omeprazole to 40 mg 2 times daily to see if this helps improves her symptoms  Fatigue:  In her visit in 2 weeks, will check TSH, vitamin D for further evaluation If labs are normal, will consider sleep study versus consistent daily exercise  RTC in 2 weeks for follow-up HTN [name], NP This visit occurred during the SARS-CoV-2 public health emergency.  Safety protocols were in place, including screening questions prior to the visit, additional usage of staff PPE, and extensive cleaning of exam room while observing appropriate contact time as indicated for disinfecting solutions.

## 2021-03-06 NOTE — Assessment & Plan Note (Signed)
Encourage diet and exercise for weight loss 

## 2021-03-06 NOTE — Patient Instructions (Signed)
How to Take Your Blood Pressure ?Blood pressure measures how strongly your blood is pressing against the walls of your arteries. Arteries are blood vessels that carry blood from your heart throughout your body. You can take your blood pressure at home with a machine. ?You may need to check your blood pressure at home: ?To check if you have high blood pressure (hypertension). ?To check your blood pressure over time. ?To make sure your blood pressure medicine is working. ?Supplies needed: ?Blood pressure machine, or monitor. ?Dining room chair to sit in. ?Table or desk. ?Small notebook. ?Pencil or pen. ?How to prepare ?Avoid these things for 30 minutes before checking your blood pressure: ?Having drinks with caffeine in them, such as coffee or tea. ?Drinking alcohol. ?Eating. ?Smoking. ?Exercising. ?Do these things five minutes before checking your blood pressure: ?Go to the bathroom and pee (urinate). ?Sit in a dining chair. Do not sit on a soft couch or an armchair. ?Be quiet. Do not talk. ?How to take your blood pressure ?Follow the instructions that came with your machine. If you have a digital blood pressure monitor, these may be the instructions: ?Sit up straight. ?Place your feet on the floor. Do not cross your ankles or legs. ?Rest your left arm at the level of your heart. You may rest it on a table, desk, or chair. ?Pull up your shirt sleeve. ?Wrap the blood pressure cuff around the upper part of your left arm. The cuff should be 1 inch (2.5 cm) above your elbow. It is best to wrap the cuff around bare skin. ?Fit the cuff snugly around your arm. You should be able to place only one finger between the cuff and your arm. ?Place the cord so that it rests in the bend of your elbow. ?Press the power button. ?Sit quietly while the cuff fills with air and loses air. ?Write down the numbers on the screen. ?Wait 2-3 minutes and then repeat steps 1-10. ?What do the numbers mean? ?Two numbers make up your blood  pressure. The first number is called systolic pressure. The second is called diastolic pressure. An example of a blood pressure reading is "120 over 80" (or 120/80). ?If you are an adult and do not have a medical condition, use this guide to find out if your blood pressure is normal: ?Normal ?First number: below 120. ?Second number: below 80. ?Elevated ?First number: 120-129. ?Second number: below 80. ?Hypertension stage 1 ?First number: 130-139. ?Second number: 80-89. ?Hypertension stage 2 ?First number: 140 or above. ?Second number: 90 or above. ?Your blood pressure is above normal even if only the first or only the second number is above normal. ?Follow these instructions at home: ?Medicines ?Take over-the-counter and prescription medicines only as told by your doctor. ?Tell your doctor if your medicine is causing side effects. ?General instructions ?Check your blood pressure as often as your doctor tells you to. ?Check your blood pressure at the same time every day. ?Take your monitor to your next doctor's appointment. Your doctor will: ?Make sure you are using it correctly. ?Make sure it is working right. ?Understand what your blood pressure numbers should be. ?Keep all follow-up visits as told by your doctor. This is important. ?General tips ?You will need a blood pressure machine, or monitor. Your doctor can suggest a monitor. You can buy one at a drugstore or online. When choosing one: ?Choose one with an arm cuff. ?Choose one that wraps around your upper arm. Only one finger should fit between   your arm and the cuff. ?Do not choose one that measures your blood pressure from your wrist or finger. ?Where to find more information ?American Heart Association: www.heart.org ?Contact a doctor if: ?Your blood pressure keeps being high. ?Your blood pressure is suddenly low. ?Get help right away if: ?Your first blood pressure number is higher than 180. ?Your second blood pressure number is higher than  120. ?Summary ?Check your blood pressure at the same time every day. ?Avoid caffeine, alcohol, smoking, and exercise for 30 minutes before checking your blood pressure. ?Make sure you understand what your blood pressure numbers should be. ?This information is not intended to replace advice given to you by your health care provider. Make sure you discuss any questions you have with your health care provider. ?Document Revised: 02/14/2020 Document Reviewed: 03/31/2019 ?Elsevier Patient Education ? 2022 Elsevier Inc. ? ?

## 2021-03-10 ENCOUNTER — Ambulatory Visit: Payer: BC Managed Care – PPO

## 2021-03-17 ENCOUNTER — Ambulatory Visit: Admission: RE | Admit: 2021-03-17 | Payer: BC Managed Care – PPO | Source: Ambulatory Visit

## 2021-03-17 ENCOUNTER — Ambulatory Visit: Payer: BC Managed Care – PPO | Admitting: Internal Medicine

## 2021-03-25 ENCOUNTER — Other Ambulatory Visit: Payer: Self-pay | Admitting: Internal Medicine

## 2021-03-25 DIAGNOSIS — K219 Gastro-esophageal reflux disease without esophagitis: Secondary | ICD-10-CM

## 2021-03-25 DIAGNOSIS — K644 Residual hemorrhoidal skin tags: Secondary | ICD-10-CM

## 2021-03-25 NOTE — Telephone Encounter (Signed)
Requested medication (s) are due for refill today: No  Requested medication (s) are on the active medication list: yes  Last refill: 03/06/21 #180  1 refill  Future visit scheduled yes  03/28/21  Notes to clinic: Listed as 'Historical Med.'  Requested Prescriptions  Pending Prescriptions Disp Refills   omeprazole (PRILOSEC) 40 MG capsule [Pharmacy Med Name: OMEPRAZOLE 40 MG CPDR 40 Capsule] 30 capsule 2    Sig: TAKE 1 CAPSULE BY MOUTH DAILY.     Gastroenterology: Proton Pump Inhibitors Passed - 03/25/2021  1:21 PM      Passed - Valid encounter within last 12 months    Recent Outpatient Visits           2 weeks ago Gastroesophageal reflux disease without esophagitis   [facility] [place], Salvadore Oxford, NP   1 month ago Encounter for general adult medical examination with abnormal findings   [facility], Salvadore Oxford, NP   1 month ago Genital warts   [facility] [place], Salvadore Oxford, NP   1 month ago Labial lesion   [facility] [place], Salvadore Oxford, NP   2 months ago Urinary urgency   [facility] [place], Salvadore Oxford, NP       Future Appointments             In 3 days Sampson Si, Salvadore Oxford, NP [facility]            Signed Prescriptions Disp Refills   hydrocortisone (ANUSOL-HC) 2.5 % rectal cream 30 g 0    Sig: PLACE 1 APPLICATION RECTALLY 2 TIMES DAILY.     Over the Counter:  OTC Passed - 03/25/2021  1:21 PM      Passed - Valid encounter within last 12 months    Recent Outpatient Visits           2 weeks ago Gastroesophageal reflux disease without esophagitis   [facility] [place], Salvadore Oxford, NP   1 month ago Encounter for general adult medical examination with abnormal findings   [facility] [place], Salvadore Oxford, NP   1 month ago Genital warts   [facility] [place], Salvadore Oxford, NP   1 month ago Labial lesion   [facility] [place],  Salvadore Oxford, NP   2 months ago Urinary urgency   [facility] [place], Salvadore Oxford, NP       Future Appointments             In 3 days [place], Salvadore Oxford, NP [facility], [facility]

## 2021-03-28 ENCOUNTER — Other Ambulatory Visit: Payer: Self-pay

## 2021-03-28 ENCOUNTER — Encounter: Payer: Self-pay | Admitting: Internal Medicine

## 2021-03-28 ENCOUNTER — Ambulatory Visit: Payer: BC Managed Care – PPO | Admitting: Internal Medicine

## 2021-03-28 VITALS — BP 122/76 | HR 71 | Temp 97.3°F | Resp 17 | Ht 67.0 in | Wt 217.0 lb

## 2021-03-28 DIAGNOSIS — Z6833 Body mass index (BMI) 33.0-33.9, adult: Secondary | ICD-10-CM | POA: Diagnosis not present

## 2021-03-28 DIAGNOSIS — E6609 Other obesity due to excess calories: Secondary | ICD-10-CM

## 2021-03-28 DIAGNOSIS — I10 Essential (primary) hypertension: Secondary | ICD-10-CM

## 2021-03-28 DIAGNOSIS — E559 Vitamin D deficiency, unspecified: Secondary | ICD-10-CM | POA: Diagnosis not present

## 2021-03-28 DIAGNOSIS — R5382 Chronic fatigue, unspecified: Secondary | ICD-10-CM | POA: Diagnosis not present

## 2021-03-28 NOTE — Patient Instructions (Signed)

## 2021-03-28 NOTE — Assessment & Plan Note (Signed)
Encouraged diet and exercise for weight loss ?

## 2021-03-28 NOTE — Progress Notes (Signed)
Subjective:    Patient ID: Diana [name], female    DOB: 1961/02/15, 60 y.o.   MRN: [id number]  HPI  Pt presents to the clinic today for follow up of HTN. She is taking HCTZ as prescribed. She has been taking the medication as prescribed and denies adverse side effects. Her BP today is 122/76. ECG from 03/2017 reviewed.  She asp reports fatigue. This is a chronic issue for her. She reports she sleeps well at night but is so tired throughout the day. She does not snore.  Review of Systems     Past Medical History:  Diagnosis Date   Allergy    Ear infection    recurrent    Current Outpatient Medications  Medication Sig Dispense Refill   Ascorbic Acid 125 MG CHEW Chew by mouth.     cetirizine (ZYRTEC) 10 MG tablet Take 10 mg by mouth daily.     Cholecalciferol (VITAMIN D3) 25 MCG (1000 UT) CAPS Take 5,000 Units by mouth.      clonazePAM (KLONOPIN) 0.5 MG tablet TAKE 1 TABLET BY MOUTH DAILY AS NEEDED FOR ANXIETY 30 tablet 0   hydrocortisone (ANUSOL-HC) 2.5 % rectal cream PLACE 1 APPLICATION RECTALLY 2 TIMES DAILY. 30 g 0   lisinopril-hydrochlorothiazide (ZESTORETIC) 10-12.5 MG tablet Take 1 tablet by mouth daily. 30 tablet 0   omeprazole (PRILOSEC) 40 MG capsule TAKE 1 CAPSULE BY MOUTH DAILY. 90 capsule 0   triamcinolone cream (KENALOG) 0.1 % Apply 1 application topically 2 (two) times daily. 30 g 0   Zinc 50 MG CAPS Take by mouth.     No current facility-administered medications for this visit.    No Known Allergies  Family History  Problem Relation Age of Onset   Hypertension Mother        alive   Arthritis Mother    Lung cancer Father        deceased   Heart disease Father    Other Other        1 brother and 1 sister both alive an healthy   Hypertension Maternal Grandmother     Social History   Socioeconomic History   Marital status: Widowed    Spouse name: Not on file   Number of children: 3   Years of education: Not on file   Highest education level: Not  on file  Occupational History   Occupation: customer service    Employer: [employer]  Tobacco Use   Smoking status: Former    Types: Cigarettes    Quit date: 04/20/1985    Years since quitting: 35.9   Smokeless tobacco: Never   Tobacco comments:    remote tobacco use   Vaping Use   Vaping Use: Never used  Substance and Sexual Activity   Alcohol use: No    Alcohol/week: 0.0 standard drinks   Drug use: No   Sexual activity: Never  Other Topics Concern   Not on file  Social History Narrative   Not on file   Social Determinants of Health   Financial Resource Strain: Not on file  Food Insecurity: Not on file  Transportation Needs: Not on file  Physical Activity: Not on file  Stress: Not on file  Social Connections: Not on file  Intimate Partner Violence: Not on file     Constitutional: Pt reports fatigue. Denies fever, malaise, or abrupt weight changes.  Respiratory: Denies difficulty breathing, shortness of breath, cough or sputum production.   Cardiovascular: Denies chest pain,  chest tightness, palpitations or swelling in the hands or feet.  Neurological: Denies dizziness, difficulty with memory, difficulty with speech or problems with balance and coordination.    No other specific complaints in a complete review of systems (except as listed in HPI above).  Objective:   Physical Exam  BP 122/76 (BP Location: Right Arm, Patient Position: Sitting, Cuff Size: Large)   Pulse 71   Temp (!) 97.3 F (36.3 C) (Temporal)   Resp 17   Ht 5\' 7"  (1.702 m)   Wt 217 lb (98.4 kg)   LMP 01/20/2016   SpO2 100%   BMI 33.99 kg/m   Wt Readings from Last 3 Encounters:  03/06/21 216 lb 9.6 oz (98.2 kg)  03/04/21 217 lb 6.4 oz (98.6 kg)  02/18/21 216 lb 6.4 oz (98.2 kg)    General: Appears her stated age, obese, in NAD. Cardiovascular: Normal rate and rhythm. S1,S2 noted.  No murmur, rubs or gallops noted. No JVD or BLE edema.  Pulmonary/Chest: Normal effort and positive  vesicular breath sounds. No respiratory distress. No wheezes, rales or ronchi noted.  Neurological: Alert and oriented.   BMET    Component Value Date/Time   NA 142 02/06/2021 0941   K 3.5 02/06/2021 0941   CL 101 02/06/2021 0941   CO2 32 02/06/2021 0941   GLUCOSE 156 (H) 02/06/2021 0941   BUN 12 02/06/2021 0941   CREATININE 0.59 02/06/2021 0941   CALCIUM 9.5 02/06/2021 0941   GFRNONAA >60 07/27/2017 2153   GFRAA >60 07/27/2017 2153    Lipid Panel     Component Value Date/Time   CHOL 171 02/18/2021 1016   TRIG 68 02/18/2021 1016   HDL 27 (L) 02/18/2021 1016   CHOLHDL 6.3 (H) 02/18/2021 1016   VLDL 21.0 02/09/2019 1245   LDLCALC 128 (H) 02/18/2021 1016    CBC    Component Value Date/Time   WBC 5.2 02/18/2021 1016   RBC 4.63 02/18/2021 1016   HGB 13.9 02/18/2021 1016   HCT 43.3 02/18/2021 1016   PLT 228 02/18/2021 1016   MCV 93.5 02/18/2021 1016   MCH 30.0 02/18/2021 1016   MCHC 32.1 02/18/2021 1016   RDW 13.0 02/18/2021 1016   LYMPHSABS 1.1 07/28/2016 1232   MONOABS 0.4 07/28/2016 1232   EOSABS 0.0 07/28/2016 1232   BASOSABS 0.0 07/28/2016 1232    Hgb A1C Lab Results  Component Value Date   HGBA1C 5.9 (H) 02/18/2021           Assessment & Plan:   Chronic Fatigue:  Will check TSH, Vit D and B12 today Consider sleep study pending labs  Return precautions discussed   13/04/2020, NP This visit occurred during the SARS-CoV-2 public health emergency.  Safety protocols were in place, including screening questions prior to the visit, additional usage of staff PPE, and extensive cleaning of exam room while observing appropriate contact time as indicated for disinfecting solutions.

## 2021-03-28 NOTE — Assessment & Plan Note (Signed)
Controlled on HCTZ Reinforced DASH diet and exercise for weight loss

## 2021-03-29 LAB — TSH: TSH: 1.39 mIU/L (ref 0.40–4.50)

## 2021-03-29 LAB — VITAMIN B12: Vitamin B-12: 335 pg/mL (ref 200–1100)

## 2021-03-29 LAB — VITAMIN D 25 HYDROXY (VIT D DEFICIENCY, FRACTURES): Vit D, 25-Hydroxy: 54 ng/mL (ref 30–100)

## 2021-04-17 ENCOUNTER — Ambulatory Visit: Payer: Self-pay

## 2021-04-17 ENCOUNTER — Telehealth: Payer: BC Managed Care – PPO | Admitting: Physician Assistant

## 2021-04-17 DIAGNOSIS — R6889 Other general symptoms and signs: Secondary | ICD-10-CM

## 2021-04-17 MED ORDER — OSELTAMIVIR PHOSPHATE 75 MG PO CAPS: 75.0000 mg | ORAL_CAPSULE | Freq: Two times a day (BID) | ORAL | 0 refills | Status: DC

## 2021-04-17 NOTE — Telephone Encounter (Signed)
°  Chief Complaint: requesting appt tomorrow due to cold sx  Symptoms: cough, constant runny nose, body aches, chills, headache  Frequency: na Pertinent Negatives: Patient denies difficulty breathing, fever Disposition: [] ED /[x] Urgent Care (no appt availability in office) / [] Appointment(In office/virtual)/ [x]   Virtual Care/ [] Home Care/ [] Refused Recommended Disposition  Additional Notes:  Requesting appt tomorrow if possible and call back for treatment of symptoms.  Patietn is going to try to get [facility] visit         Reason for Disposition  Care advice for mild cough, questions about  Answer Assessment - Initial Assessment Questions 1. ONSET: "When did the nasal discharge start?"      Na  2. AMOUNT: "How much discharge is there?"      Constant runny of nose  3. COUGH: "Do you have a cough?" If yes, ask: "Describe the color of your sputum" (clear, white, yellow, green)     Yes  4. RESPIRATORY DISTRESS: "Describe your breathing."      Breathing is not affected  5. FEVER: "Do you have a fever?" If Yes, ask: "What is your temperature, how was it measured, and when did it start?"     chills 6. SEVERITY: "Overall, how bad are you feeling right now?" (e.g., doesn't interfere with normal activities, staying home from school/work, staying in bed)      Na  7. OTHER SYMPTOMS: "Do you have any other symptoms?" (e.g., sore throat, earache, wheezing, vomiting)     Cough , chills, body aches nose constant running clear mucus  8. PREGNANCY: "Is there any chance you are pregnant?" "When was your last menstrual period?"     na  Protocols used: Common Cold-A-AH

## 2021-04-17 NOTE — Telephone Encounter (Signed)
Patient called, left VM to return the call to the office to discuss symptoms with a nurse.     Summary: poss flu per patient neg covid   Pt called saying she has a cough, congestion, headache, .  Tested neg for covid.  No appts until next week.   CB#  [phone]

## 2021-04-17 NOTE — Progress Notes (Signed)

## 2021-04-17 NOTE — Progress Notes (Signed)
I have spent 5 minutes in review of e-visit questionnaire, review and updating patient chart, medical decision making and response to patient.   Ariaunna Longsworth Cody Jaiel Saraceno, PA-C    

## 2021-04-18 NOTE — Telephone Encounter (Signed)
I contacted the patient and she informed me that she did an Evisit.

## 2021-04-18 NOTE — Telephone Encounter (Signed)
Pt. Had e-visit yesterday and prescribed Tamiflu. Today has tested positive for COVID 19. Cough is keeping her up at night. Requesting cough medication. Asking if she needs to take an anti-viral and should she hold Tamiflu. Please advise pt.

## 2021-04-18 NOTE — Telephone Encounter (Signed)
The pt was notified of Regina recommendation. She verbalize understanding, no questions or concerns. 

## 2021-04-18 NOTE — Telephone Encounter (Signed)
If symptoms are mild, would not recommend taking Tamiflu or antiviral.  I would just treat symptoms with OTC medications.  As far as prescription cough medicine, I cannot send this in without evaluating her.  Would recommend Delsym or NyQuil OTC

## 2021-04-22 ENCOUNTER — Encounter: Payer: Self-pay | Admitting: Internal Medicine

## 2021-04-22 ENCOUNTER — Telehealth (INDEPENDENT_AMBULATORY_CARE_PROVIDER_SITE_OTHER): Payer: BC Managed Care – PPO | Admitting: Internal Medicine

## 2021-04-22 DIAGNOSIS — U071 COVID-19: Secondary | ICD-10-CM

## 2021-04-22 DIAGNOSIS — K219 Gastro-esophageal reflux disease without esophagitis: Secondary | ICD-10-CM

## 2021-04-22 MED ORDER — CLONAZEPAM 0.5 MG PO TABS: 0.5000 mg | ORAL_TABLET | Freq: Every day | ORAL | 0 refills | Status: DC | PRN

## 2021-04-22 MED ORDER — OMEPRAZOLE 40 MG PO CPDR: 40.0000 mg | DELAYED_RELEASE_CAPSULE | Freq: Two times a day (BID) | ORAL | 0 refills | Status: DC

## 2021-04-22 MED ORDER — PROMETHAZINE-DM 6.25-15 MG/5ML PO SYRP: 5.0000 mL | ORAL_SOLUTION | Freq: Four times a day (QID) | ORAL | 0 refills | Status: DC | PRN

## 2021-04-22 NOTE — Progress Notes (Signed)
Virtual Visit via Video Note  I connected with Diana [name] on 04/22/21 at 11:20 AM EST by a video enabled telemedicine application and verified that I am speaking with the correct person using two identifiers.  Location: Patient: Home Provider: Office  Person's participating in a video call: [name], NP-C and Cadedra Abdullah   I discussed the limitations of evaluation and management by telemedicine and the availability of in person appointments. The patient expressed understanding and agreed to proceed.  History of Present Illness:  Pt reports headache, cough, nausea, chills, reflux and epigastric pain. She reports this started 1 week ago. The headache she feels is because of the cough. The cough is mostly nonproductive. She denies vomiting,  constipation, diarrhea or blood in her stool. She denies runny nose, nasal congestion, ear pain, sore throat, shortness of breath, fever or body aches at this time. She did an Evisit 12/29, was prescribed Tamiflu for flu like symptoms, only took 2 pills. She tested positive for covid on 12/30.   Past Medical History:  Diagnosis Date   Allergy    Ear infection    recurrent    Current Outpatient Medications  Medication Sig Dispense Refill   Ascorbic Acid 125 MG CHEW Chew by mouth.     cetirizine (ZYRTEC) 10 MG tablet Take 10 mg by mouth daily.     Cholecalciferol (VITAMIN D3) 25 MCG (1000 UT) CAPS Take 5,000 Units by mouth.      clonazePAM (KLONOPIN) 0.5 MG tablet TAKE 1 TABLET BY MOUTH DAILY AS NEEDED FOR ANXIETY 30 tablet 0   hydrochlorothiazide (MICROZIDE) 12.5 MG capsule Take 12.5 mg by mouth daily.     hydrocortisone (ANUSOL-HC) 2.5 % rectal cream PLACE 1 APPLICATION RECTALLY 2 TIMES DAILY. 30 g 0   omeprazole (PRILOSEC) 40 MG capsule TAKE 1 CAPSULE BY MOUTH DAILY. 90 capsule 0   oseltamivir (TAMIFLU) 75 MG capsule Take 1 capsule (75 mg total) by mouth 2 (two) times daily. 10 capsule 0   triamcinolone cream (KENALOG) 0.1 % Apply 1  application topically 2 (two) times daily. 30 g 0   Zinc 50 MG CAPS Take by mouth.     No current facility-administered medications for this visit.    No Known Allergies  Family History  Problem Relation Age of Onset   Hypertension Mother        alive   Arthritis Mother    Lung cancer Father        deceased   Heart disease Father    Other Other        1 brother and 1 sister both alive an healthy   Hypertension Maternal Grandmother     Social History   Socioeconomic History   Marital status: Widowed    Spouse name: Not on file   Number of children: 3   Years of education: Not on file   Highest education level: Not on file  Occupational History   Occupation: customer service    Employer: [employer]  Tobacco Use   Smoking status: Former    Types: Cigarettes    Quit date: 04/20/1985    Years since quitting: 36.0   Smokeless tobacco: Never   Tobacco comments:    remote tobacco use   Vaping Use   Vaping Use: Never used  Substance and Sexual Activity   Alcohol use: No    Alcohol/week: 0.0 standard drinks   Drug use: No   Sexual activity: Never  Other Topics Concern  Not on file  Social History Narrative   Not on file   Social Determinants of Health   Financial Resource Strain: Not on file  Food Insecurity: Not on file  Transportation Needs: Not on file  Physical Activity: Not on file  Stress: Not on file  Social Connections: Not on file  Intimate Partner Violence: Not on file     Constitutional: Pt reports headache. Denies fever, malaise, fatigue, or abrupt weight changes.  HEENT: Denies eye pain, eye redness, ear pain, ringing in the ears, wax buildup, runny nose, nasal congestion, bloody nose, or sore throat. Respiratory: Pt reports cough. Denies difficulty breathing, shortness of breath, or sputum production.   Cardiovascular: Denies chest pain, chest tightness, palpitations or swelling in the hands or feet.  Gastrointestinal: Pt reports nausea,  reflux and epigastric pain. Denies bloating, constipation, diarrhea or blood in the stool.  Psych: Denies anxiety, depression, SI/HI.  No other specific complaints in a complete review of systems (except as listed in HPI above).    Observations/Objective:   Wt Readings from Last 3 Encounters:  03/28/21 217 lb (98.4 kg)  03/06/21 216 lb 9.6 oz (98.2 kg)  03/04/21 217 lb 6.4 oz (98.6 kg)    General: Appears her stated age, in NAD. HEENT: Nose: no congestion noted; Throat/Mouth: no hoarseness noted Neurological: Alert and oriented.   BMET    Component Value Date/Time   NA 142 02/06/2021 0941   K 3.5 02/06/2021 0941   CL 101 02/06/2021 0941   CO2 32 02/06/2021 0941   GLUCOSE 156 (H) 02/06/2021 0941   BUN 12 02/06/2021 0941   CREATININE 0.59 02/06/2021 0941   CALCIUM 9.5 02/06/2021 0941   GFRNONAA >60 07/27/2017 2153   GFRAA >60 07/27/2017 2153    Lipid Panel     Component Value Date/Time   CHOL 171 02/18/2021 1016   TRIG 68 02/18/2021 1016   HDL 27 (L) 02/18/2021 1016   CHOLHDL 6.3 (H) 02/18/2021 1016   VLDL 21.0 02/09/2019 1245   LDLCALC 128 (H) 02/18/2021 1016    CBC    Component Value Date/Time   WBC 5.2 02/18/2021 1016   RBC 4.63 02/18/2021 1016   HGB 13.9 02/18/2021 1016   HCT 43.3 02/18/2021 1016   PLT 228 02/18/2021 1016   MCV 93.5 02/18/2021 1016   MCH 30.0 02/18/2021 1016   MCHC 32.1 02/18/2021 1016   RDW 13.0 02/18/2021 1016   LYMPHSABS 1.1 07/28/2016 1232   MONOABS 0.4 07/28/2016 1232   EOSABS 0.0 07/28/2016 1232   BASOSABS 0.0 07/28/2016 1232    Hgb A1C Lab Results  Component Value Date   HGBA1C 5.9 (H) 02/18/2021       Assessment and Plan:  Covid 19:  Get some rest and drink plenty of fluids RX for Promethazine DM cough syrup- sedation caution given Increase Omeprazole to 40 mg BID- refilled today Work note provided  Return precautions discussed  Follow Up Instructions:    I discussed the assessment and treatment plan with  the patient. The patient was provided an opportunity to ask questions and all were answered. The patient agreed with the plan and demonstrated an understanding of the instructions.   The patient was advised to call back or seek an in-person evaluation if the symptoms worsen or if the condition fails to improve as anticipated.   Webb Silversmith, NP

## 2021-04-22 NOTE — Patient Instructions (Signed)

## 2021-04-24 ENCOUNTER — Other Ambulatory Visit: Payer: Self-pay

## 2021-04-24 ENCOUNTER — Emergency Department (HOSPITAL_COMMUNITY)
Admission: EM | Admit: 2021-04-24 | Discharge: 2021-04-25 | Disposition: A | Discharge status: Home / Self Care | Payer: BC Managed Care – PPO | Attending: Emergency Medicine | Admitting: Emergency Medicine

## 2021-04-24 ENCOUNTER — Ambulatory Visit
Admission: EM | Admit: 2021-04-24 | Discharge: 2021-04-24 | Disposition: A | Discharge status: Home / Self Care | Payer: BC Managed Care – PPO | Attending: Internal Medicine | Admitting: Internal Medicine

## 2021-04-24 ENCOUNTER — Ambulatory Visit: Payer: Self-pay | Admitting: *Deleted

## 2021-04-24 ENCOUNTER — Encounter: Payer: Self-pay | Admitting: Emergency Medicine

## 2021-04-24 ENCOUNTER — Emergency Department (HOSPITAL_COMMUNITY): Payer: BC Managed Care – PPO

## 2021-04-24 ENCOUNTER — Telehealth: Payer: BC Managed Care – PPO | Admitting: Physician Assistant

## 2021-04-24 DIAGNOSIS — M549 Dorsalgia, unspecified: Secondary | ICD-10-CM | POA: Diagnosis not present

## 2021-04-24 DIAGNOSIS — R079 Chest pain, unspecified: Secondary | ICD-10-CM | POA: Diagnosis not present

## 2021-04-24 DIAGNOSIS — R42 Dizziness and giddiness: Secondary | ICD-10-CM | POA: Insufficient documentation

## 2021-04-24 DIAGNOSIS — R1013 Epigastric pain: Secondary | ICD-10-CM

## 2021-04-24 DIAGNOSIS — Z5321 Procedure and treatment not carried out due to patient leaving prior to being seen by health care provider: Secondary | ICD-10-CM | POA: Insufficient documentation

## 2021-04-24 DIAGNOSIS — R0789 Other chest pain: Secondary | ICD-10-CM | POA: Diagnosis not present

## 2021-04-24 LAB — CBC
HCT: 42.7 % (ref 36.0–46.0)
Hemoglobin: 14.9 g/dL (ref 12.0–15.0)
MCH: 30.8 pg (ref 26.0–34.0)
MCHC: 34.9 g/dL (ref 30.0–36.0)
MCV: 88.2 fL (ref 80.0–100.0)
Platelets: 208 10*3/uL (ref 150–400)
RBC: 4.84 MIL/uL (ref 3.87–5.11)
RDW: 13.1 % (ref 11.5–15.5)
WBC: 3.6 10*3/uL — ABNORMAL LOW (ref 4.0–10.5)
nRBC: 0 % (ref 0.0–0.2)

## 2021-04-24 LAB — BASIC METABOLIC PANEL
Anion gap: 10 (ref 5–15)
BUN: 5 mg/dL — ABNORMAL LOW (ref 6–20)
CO2: 33 mmol/L — ABNORMAL HIGH (ref 22–32)
Calcium: 9 mg/dL (ref 8.9–10.3)
Chloride: 94 mmol/L — ABNORMAL LOW (ref 98–111)
Creatinine, Ser: 0.57 mg/dL (ref 0.44–1.00)
GFR, Estimated: >60 mL/min (ref >60)
Glucose, Bld: 119 mg/dL — ABNORMAL HIGH (ref 70–99)
Potassium: 3.1 mmol/L — ABNORMAL LOW (ref 3.5–5.1)
Sodium: 137 mmol/L (ref 135–145)

## 2021-04-24 LAB — TROPONIN I (HIGH SENSITIVITY)
Troponin I (High Sensitivity): 3 ng/L (ref <18)
Troponin I (High Sensitivity): 3 ng/L (ref <18)

## 2021-04-24 LAB — I-STAT BETA HCG BLOOD, ED (MC, WL, AP ONLY): I-stat hCG, quantitative: <5 m[IU]/mL (ref <5)

## 2021-04-24 NOTE — ED Triage Notes (Signed)
Pt reports three days of central chest pain that she states feels like acid reflux. Pain radiates through to her mid back all the way across. Dx with covid seven days ago and sent here from UC for further eval. Has associated nausea.

## 2021-04-24 NOTE — Telephone Encounter (Signed)
Reached out to patient via her MyChart to notify her that Virutal UC Visit not appropriate giving worsening symptoms despite increase in PPI and 10/10 pain noted in triage note.

## 2021-04-24 NOTE — Progress Notes (Signed)
Virtual Visit Consent   Diana [name], you are scheduled for a virtual visit with a [facility] provider today.     Just as with appointments in the office, your consent must be obtained to participate.  Your consent will be active for this visit and any virtual visit you may have with one of our providers in the next 365 days.     If you have a MyChart account, a copy of this consent can be sent to you electronically.  All virtual visits are billed to your insurance company just like a traditional visit in the office.    As this is a virtual visit, video technology does not allow for your provider to perform a traditional examination.  This may limit your provider's ability to fully assess your condition.  If your provider identifies any concerns that need to be evaluated in person or the need to arrange testing (such as labs, EKG, etc.), we will make arrangements to do so.     Although advances in technology are sophisticated, we cannot ensure that it will always work on either your end or our end.  If the connection with a video visit is poor, the visit may have to be switched to a telephone visit.  With either a video or telephone visit, we are not always able to ensure that we have a secure connection.     I need to obtain your verbal consent now.   Are you willing to proceed with your visit today?    Diana [name] has provided verbal consent on 04/24/2021 for a virtual visit (video or telephone).   Diana [name], [place]   Date: 04/24/2021 3:27 PM   Virtual Visit via Video Note   I, Diana [name], connected with  Diana [name]  ([id number], 12/07/1960) on 04/24/21 at  3:30 PM EST by a video-enabled telemedicine application and verified that I am speaking with the correct person using two identifiers.  Location: Patient: Virtual Visit Location Patient: Mobile Provider: Virtual Visit Location Provider: Home Office   I discussed the limitations of evaluation and management  by telemedicine and the availability of in person appointments. The patient expressed understanding and agreed to proceed.    History of Present Illness: Diana [name] is a 61 y.o. who identifies as a female who was assigned female at birth, and is being seen today for 10/10 upper abdominal pain radiating through to her back with associated increased heartburn and reflux despite recent increase in her PPI. Was triaged by RN through [facility] and was recommended to do a video visit. Denies any vomiting. Notes she feels horrendous and that things continue to slowly worsen.   HPI: HPI  Problems:  Patient Active Problem List   Diagnosis Date Noted   Prediabetes 01/02/2021   Class 1 obesity due to excess calories with body mass index (BMI) of 33.0 to 33.9 in adult 11/12/2020   Anxiety and depression 06/08/2019   Chronic constipation 04/26/2019   Intermittent low back pain 10/11/2018   Chronic hepatitis B virus infection (HCC) 11/10/2017   GERD (gastroesophageal reflux disease) 06/09/2016   Essential hypertension 03/07/2015    Allergies: No Known Allergies Medications:  Current Outpatient Medications:    Ascorbic Acid 125 MG CHEW, Chew by mouth., Disp: , Rfl:    cetirizine (ZYRTEC) 10 MG tablet, Take 10 mg by mouth daily., Disp: , Rfl:    Cholecalciferol (VITAMIN D3) 25 MCG (1000 UT) CAPS, Take 5,000 Units by mouth. ,  Disp: , Rfl:    clonazePAM (KLONOPIN) 0.5 MG tablet, Take 1 tablet (0.5 mg total) by mouth daily as needed. for anxiety, Disp: 30 tablet, Rfl: 0   hydrochlorothiazide (MICROZIDE) 12.5 MG capsule, Take 12.5 mg by mouth daily., Disp: , Rfl:    hydrocortisone (ANUSOL-HC) 2.5 % rectal cream, PLACE 1 APPLICATION RECTALLY 2 TIMES DAILY., Disp: 30 g, Rfl: 0   omeprazole (PRILOSEC) 40 MG capsule, Take 1 capsule (40 mg total) by mouth 2 (two) times daily., Disp: 180 capsule, Rfl: 0   oseltamivir (TAMIFLU) 75 MG capsule, Take 1 capsule (75 mg total) by mouth 2 (two) times daily., Disp: 10  capsule, Rfl: 0   promethazine-dextromethorphan (PROMETHAZINE-DM) 6.25-15 MG/5ML syrup, Take 5 mLs by mouth 4 (four) times daily as needed for cough., Disp: 118 mL, Rfl: 0   triamcinolone cream (KENALOG) 0.1 %, Apply 1 application topically 2 (two) times daily., Disp: 30 g, Rfl: 0   Zinc 50 MG CAPS, Take by mouth., Disp: , Rfl:   Observations/Objective: Patient is well-developed, well-nourished in no acute distress.  Resting comfortably at home.  Head is normocephalic, atraumatic.  No labored breathing. Speech is clear and coherent with logical content.  Patient is alert and oriented at baseline.   Assessment and Plan: 1. Epigastric pain  10/10 wiuth radiation to the back. She needs evaluation in person ASAP. Discussed ER near her. She is worried about wait. Discussed she can be seen first at local UC but someone needs to lay hands on her and do a full examination. If anything worsens, has to be seen at ER.   Follow Up Instructions: I discussed the assessment and treatment plan with the patient. The patient was provided an opportunity to ask questions and all were answered. The patient agreed with the plan and demonstrated an understanding of the instructions.  A copy of instructions were sent to the patient via MyChart unless otherwise noted below.   The patient was advised to call back or seek an in-person evaluation if the symptoms worsen or if the condition fails to improve as anticipated.  Time:  I spent 10 minutes with the patient via telehealth technology discussing the above problems/concerns.    Diana Climes, PA-C

## 2021-04-24 NOTE — Telephone Encounter (Signed)
°  Chief Complaint: abdominal pain Symptoms: upper GI pain, back pain, burning sensation Frequency: 2 weeks Pertinent Negatives: NA Disposition: [] ED /[] Urgent Care (no appt availability in office) / [] Appointment(In office/virtual)/ [x]  [place] [facility]/ [] Home Care/ [] Refused Recommended Disposition /[] [place] [facility]/ []  Follow-up with PCP Additional Notes: no available appt in office so pt scheduled for virtual UC visit. She states the increased omeprazole hasnt helped and she has been unable to eat because the burning is so bad. Care advice given and pt verbalized understanding. No other questions/concerns noted.     Reason for Disposition  [1] MILD-MODERATE pain AND [2] constant AND [3] present > 2 hours  Answer Assessment - Initial Assessment Questions 1. LOCATION: "Where does it hurt?"      In middle of breast and back  3. ONSET: "When did the pain begin?" (e.g., minutes, hours or days ago)      2 weeks 4. SUDDEN: "Gradual or sudden onset?"     sudden 5. PATTERN "Does the pain come and go, or is it constant?"    - If constant: "Is it getting better, staying the same, or worsening?"      (Note: Constant means the pain never goes away completely; most serious pain is constant and it progresses)     - If intermittent: "How long does it last?" "Do you have pain now?"     (Note: Intermittent means the pain goes away completely between bouts)     constant 6. SEVERITY: "How bad is the pain?"  (e.g., Scale 1-10; mild, moderate, or severe)    - MILD (1-3): doesn't interfere with normal activities, abdomen soft and not tender to touch     - MODERATE (4-7): interferes with normal activities or awakens from sleep, abdomen tender to touch     - SEVERE (8-10): excruciating pain, doubled over, unable to do any normal activities       10 7. RECURRENT SYMPTOM: "Have you ever had this type of stomach pain before?" If Yes, ask: "When was the last time?" and "What happened that time?"       6-7 years 8. AGGRAVATING FACTORS: "Does anything seem to cause this pain?" (e.g., foods, stress, alcohol)     Food, movement, liquids 10. OTHER SYMPTOMS: "Do you have any other symptoms?" (e.g., back pain, diarrhea, fever, urination pain, vomiting)       nausea  Protocols used: Abdominal Pain - Upper-A-AH

## 2021-04-24 NOTE — ED Triage Notes (Signed)
Patient states that she tested positive for COVID x 6 days ago.  Patient's acid reflux started hurting really badly x 2 days ago.  Patient's PCP recommended that patient increase her Omeprazole 40mg  bid which hasn't helped.  The pain does radiate to her back and it's in the center of her chest.  Some burping.

## 2021-04-24 NOTE — Patient Instructions (Signed)
°  Ermalene Postin, thank you for joining Piedad Climes, PA-C for today's virtual visit.  While this provider is not your primary care provider (PCP), if your PCP is located in our provider database this encounter information will be shared with them immediately following your visit.  Consent: (Patient) Diana [name] provided verbal consent for this virtual visit at the beginning of the encounter.  Current Medications:  Current Outpatient Medications:    Ascorbic Acid 125 MG CHEW, Chew by mouth., Disp: , Rfl:    cetirizine (ZYRTEC) 10 MG tablet, Take 10 mg by mouth daily., Disp: , Rfl:    Cholecalciferol (VITAMIN D3) 25 MCG (1000 UT) CAPS, Take 5,000 Units by mouth. , Disp: , Rfl:    clonazePAM (KLONOPIN) 0.5 MG tablet, Take 1 tablet (0.5 mg total) by mouth daily as needed. for anxiety, Disp: 30 tablet, Rfl: 0   hydrochlorothiazide (MICROZIDE) 12.5 MG capsule, Take 12.5 mg by mouth daily., Disp: , Rfl:    hydrocortisone (ANUSOL-HC) 2.5 % rectal cream, PLACE 1 APPLICATION RECTALLY 2 TIMES DAILY., Disp: 30 g, Rfl: 0   omeprazole (PRILOSEC) 40 MG capsule, Take 1 capsule (40 mg total) by mouth 2 (two) times daily., Disp: 180 capsule, Rfl: 0   oseltamivir (TAMIFLU) 75 MG capsule, Take 1 capsule (75 mg total) by mouth 2 (two) times daily., Disp: 10 capsule, Rfl: 0   promethazine-dextromethorphan (PROMETHAZINE-DM) 6.25-15 MG/5ML syrup, Take 5 mLs by mouth 4 (four) times daily as needed for cough., Disp: 118 mL, Rfl: 0   triamcinolone cream (KENALOG) 0.1 %, Apply 1 application topically 2 (two) times daily., Disp: 30 g, Rfl: 0   Zinc 50 MG CAPS, Take by mouth., Disp: , Rfl:    Medications ordered in this encounter:  No orders of the defined types were placed in this encounter.    *If you need refills on other medications prior to your next appointment, please contact your pharmacy*  Follow-Up: Call back or seek an in-person evaluation if the symptoms worsen or if the condition fails to  improve as anticipated.  Other Instructions Please use the link below to get scheduled at one of our Urgent [facility]ares this evening for evaluation. If they are unable to get you in or anything worsens, you need an ER evaluation.    If you have been instructed to have an in-person evaluation today at a local Urgent Care facility, please use the link below. It will take you to a list of all of our available [facility], including address, phone number and hours of operation. Please do not delay care.  [facility]  If you or a family member do not have a primary care provider, use the link below to schedule a visit and establish care. When you choose a [facility] primary care physician or advanced practice provider, you gain a long-term partner in health. Find a Primary Care Provider  Learn more about [facility] in-office and virtual care options: [facility] Get Care Now

## 2021-04-24 NOTE — Telephone Encounter (Signed)
Agreed, recommend UC/ER eval

## 2021-04-24 NOTE — Discharge Instructions (Signed)
Please go to the hospital as soon as you leave urgent care for further evaluation and management. 

## 2021-04-24 NOTE — ED Provider Notes (Signed)
[name]    CSN: [id number] Arrival date & time: 04/24/21  1610      History   Chief Complaint Chief Complaint  Patient presents with   Gastroesophageal Reflux    HPI Diana [name] is a 61 y.o. female.   Patient presents with centralized chest pain that has been present for approximately 2 days.  Patient rates the pain 8/10 on pain scale and is described as a constant and sharp pain. Pain does not radiate. Patient reports that the pain radiates to left chest around to mid back.  No relieving or aggravating factors to pain.  She reports that she tested positive for COVID-19 approximately 6 days ago and still has some associated upper respiratory symptoms as well.  Denies any shortness of breath.  She was seen by video visit today and advised to go to the ER for further evaluation and management.   Gastroesophageal Reflux   Past Medical History:  Diagnosis Date   Allergy    Ear infection    recurrent    Patient Active Problem List   Diagnosis Date Noted   Prediabetes 01/02/2021   Class 1 obesity due to excess calories with body mass index (BMI) of 33.0 to 33.9 in adult 11/12/2020   Anxiety and depression 06/08/2019   Chronic constipation 04/26/2019   Intermittent low back pain 10/11/2018   Chronic hepatitis B virus infection (HCC) 11/10/2017   GERD (gastroesophageal reflux disease) 06/09/2016   Essential hypertension 03/07/2015    Past Surgical History:  Procedure Laterality Date   ABDOMINAL HYSTERECTOMY  2019   partial   TONSILLECTOMY     TUBAL LIGATION     bilateral    OB History   No obstetric history on file.      Home Medications    Prior to Admission medications   Medication Sig Start Date End Date Taking? Authorizing Provider  Ascorbic Acid 125 MG CHEW Chew by mouth.   Yes [provider]  cetirizine (ZYRTEC) 10 MG tablet Take 10 mg by mouth daily.   Yes [provider]  Cholecalciferol (VITAMIN D3) 25 MCG (1000  UT) CAPS Take 5,000 Units by mouth.    Yes [provider]  clonazePAM (KLONOPIN) 0.5 MG tablet Take 1 tablet (0.5 mg total) by mouth daily as needed. for anxiety 04/22/21  Yes Baity, Salvadore Oxford, NP  hydrochlorothiazide (MICROZIDE) 12.5 MG capsule Take 12.5 mg by mouth daily. 03/25/21  Yes [provider]  hydrocortisone (ANUSOL-HC) 2.5 % rectal cream PLACE 1 APPLICATION RECTALLY 2 TIMES DAILY. 03/25/21  Yes [name], NP  omeprazole (PRILOSEC) 40 MG capsule Take 1 capsule (40 mg total) by mouth 2 (two) times daily. 04/22/21  Yes [name], NP  promethazine-dextromethorphan (PROMETHAZINE-DM) 6.25-15 MG/5ML syrup Take 5 mLs by mouth 4 (four) times daily as needed for cough. 04/22/21  Yes Baity, Salvadore Oxford, NP  triamcinolone cream (KENALOG) 0.1 % Apply 1 application topically 2 (two) times daily. 11/12/20  Yes [name], NP  Zinc 50 MG CAPS Take by mouth.   Yes [provider]  oseltamivir (TAMIFLU) 75 MG capsule Take 1 capsule (75 mg total) by mouth 2 (two) times daily. 04/17/21   Waldon Merl, PA-C    Family History Family History  Problem Relation Age of Onset   Hypertension Mother        alive   Arthritis Mother    Lung cancer Father        deceased  Heart disease Father    Other Other        1 brother and 1 sister both alive an healthy   Hypertension Maternal Grandmother     Social History Social History   Tobacco Use   Smoking status: Former    Types: Cigarettes    Quit date: 04/20/1985    Years since quitting: 36.0   Smokeless tobacco: Never   Tobacco comments:    remote tobacco use   Vaping Use   Vaping Use: Never used  Substance Use Topics   Alcohol use: No    Alcohol/week: 0.0 standard drinks   Drug use: No     Allergies   Patient has no known allergies.   Review of Systems Review of Systems Per HPI  Physical Exam Triage Vital Signs ED Triage Vitals [04/24/21 1621]  Enc Vitals Group     BP 129/80     Pulse Rate 79      Resp 20     Temp 98.3 F (36.8 C)     Temp Source Oral     SpO2 96 %     Weight 206 lb (93.4 kg)     Height 5\' 7"  (1.702 m)     Head Circumference      Peak Flow      Pain Score 8     Pain Loc      Pain Edu?      Excl. in GC?    No data found.  Updated Vital Signs BP 129/80 (BP Location: Left Arm)    Pulse 79    Temp 98.3 F (36.8 C) (Oral)    Resp 20    Ht 5\' 7"  (1.702 m)    Wt 206 lb (93.4 kg)    LMP 01/20/2016    SpO2 96%    BMI 32.26 kg/m   Visual Acuity Right Eye Distance:   Left Eye Distance:   Bilateral Distance:    Right Eye Near:   Left Eye Near:    Bilateral Near:     Physical Exam Constitutional:      General: She is not in acute distress.    Appearance: Normal appearance. She is not toxic-appearing or diaphoretic.  HENT:     Head: Normocephalic and atraumatic.     Right Ear: Tympanic membrane and ear canal normal.     Left Ear: Tympanic membrane and ear canal normal.     Nose: Congestion present.     Mouth/Throat:     Mouth: Mucous membranes are moist.     Pharynx: No posterior oropharyngeal erythema.  Eyes:     Extraocular Movements: Extraocular movements intact.     Conjunctiva/sclera: Conjunctivae normal.     Pupils: Pupils are equal, round, and reactive to light.  Cardiovascular:     Rate and Rhythm: Normal rate and regular rhythm.     Pulses: Normal pulses.     Heart sounds: Normal heart sounds.  Pulmonary:     Effort: Pulmonary effort is normal. No respiratory distress.     Breath sounds: Normal breath sounds. No stridor. No wheezing, rhonchi or rales.  Chest:     Chest wall: No tenderness.  Abdominal:     General: Abdomen is flat. Bowel sounds are normal. There is no distension.     Palpations: Abdomen is soft.     Tenderness: There is no abdominal tenderness.  Musculoskeletal:        General: Normal range of motion.     Cervical  back: Normal range of motion.  Skin:    General: Skin is warm and dry.  Neurological:     General:  No focal deficit present.     Mental Status: She is alert and oriented to person, place, and time. Mental status is at baseline.  Psychiatric:        Mood and Affect: Mood normal.        Behavior: Behavior normal.     UC Treatments / Results  Labs (all labs ordered are listed, but only abnormal results are displayed) Labs Reviewed - No data to display  EKG   Radiology No results found.  Procedures Procedures (including critical care time)  Medications Ordered in UC Medications - No data to display  Initial Impression / Assessment and Plan / UC Course  I have reviewed the triage vital signs and the nursing notes.  Pertinent labs & imaging results that were available during my care of the patient were reviewed by me and considered in my medical decision making (see chart for details).     EKG showing normal sinus rhythm with possible T wave abnormality.  Patient is also having severe chest/epigastric pain.  There is mild suspicion for PE given chest pain in the setting of acute respiratory infection as well.  Patient was advised that she will need to go to the hospital for more extensive evaluation.  Patient was agreeable with plan.  Suggested EMS transport due to chest pain but patient declined.  Risks associated with not going to hospital via EMS were discussed with patient.  Patient voiced understanding.  Patient left via self transport. Final Clinical Impressions(s) / UC Diagnoses   Final diagnoses:  Other chest pain     Discharge Instructions      Please go to the hospital as soon as you leave urgent care for further evaluation and management.    ED Prescriptions   None    PDMP not reviewed this encounter.   Gustavus Bryant, [place] 04/24/21 1715

## 2021-04-24 NOTE — Telephone Encounter (Signed)
Summary: stomach discomfort   The patient has tested positive for COVID 19 on Friday 03/3021   The patient shares that most of their symptoms have subsided but they are experiencing significant digestive discomfort   The patient has a previous history of acid reflux concerns and is feeling a burning sensation when they try to eat   The patient would like to discuss their discomfort further when available       Called patient to review GI symptoms s/p covid . No answer, left message to call clinic #[phone]

## 2021-04-24 NOTE — ED Provider Triage Note (Signed)
Emergency Medicine Provider Triage Evaluation Note  Diana [name] , a 61 y.o. female  was evaluated in triage.  Pt complains of reflux type chest pain.  Patient states that she was diagnosed with COVID last Thursday.  She states that then about 2 days ago she began having worsening reflux symptoms.  She feels like she is having burning in her throat down the center of her chest.  She also endorses worsening back pain.  She has endorsed lightheadedness.  Denies palpitations or shortness of breath.  Review of Systems  Positive: See above Negative:   Physical Exam  BP (!) 148/91    Pulse 80    Temp 98.3 F (36.8 C)    Resp 16    LMP 01/20/2016    SpO2 98%  Gen:   Awake, no distress   Resp:  Normal effort  MSK:   Moves extremities without difficulty  Other:  S1/S2 without murmur  Medical Decision Making  Medically screening exam initiated at 7:16 PM.  Appropriate orders placed.  KENNEDY BRINES was informed that the remainder of the evaluation will be completed by another provider, this initial triage assessment does not replace that evaluation, and the importance of remaining in the ED until their evaluation is complete.     Cristopher Peru, PA-C 04/24/21 [phone]

## 2021-04-25 ENCOUNTER — Ambulatory Visit: Payer: Self-pay | Admitting: *Deleted

## 2021-04-25 NOTE — Telephone Encounter (Signed)
Patient calling from ED and has been waiting to be evaluated by MD x 18 hours per  patient as advised yesterday to go to ED for evaluation. Patient requesting to leave and would like PCP to review labs and review with her. Instructed patient to stay at ED and NT will send message to PCP regarding further instructions. Patient has seen lab work via My Chart . Please advise . Patient wants to leave ED now. Patient requesting a call back.

## 2021-04-25 NOTE — Telephone Encounter (Signed)
The pt was notified of her results and scheduled for a f/u appt.

## 2021-04-25 NOTE — Telephone Encounter (Signed)
Potassium low but labs otherwise unremarkable. Did she stay for evaluation or did she end up leaving without being seen? If she left, would recommend follow up with me.

## 2021-04-25 NOTE — ED Notes (Signed)
Patient decided to leave stated that she couldn't wait no longer

## 2021-04-29 ENCOUNTER — Encounter: Payer: Self-pay | Admitting: Internal Medicine

## 2021-04-29 ENCOUNTER — Other Ambulatory Visit: Payer: Self-pay

## 2021-04-29 ENCOUNTER — Ambulatory Visit: Payer: BC Managed Care – PPO | Admitting: Internal Medicine

## 2021-04-29 VITALS — BP 137/79 | HR 71 | Temp 97.3°F | Resp 18 | Ht 67.0 in | Wt 209.2 lb

## 2021-04-29 DIAGNOSIS — R1013 Epigastric pain: Secondary | ICD-10-CM

## 2021-04-29 DIAGNOSIS — U071 COVID-19: Secondary | ICD-10-CM

## 2021-04-29 DIAGNOSIS — E876 Hypokalemia: Secondary | ICD-10-CM

## 2021-04-29 DIAGNOSIS — R531 Weakness: Secondary | ICD-10-CM

## 2021-04-29 DIAGNOSIS — R110 Nausea: Secondary | ICD-10-CM

## 2021-04-29 DIAGNOSIS — R051 Acute cough: Secondary | ICD-10-CM

## 2021-04-29 DIAGNOSIS — K219 Gastro-esophageal reflux disease without esophagitis: Secondary | ICD-10-CM

## 2021-04-29 NOTE — Patient Instructions (Signed)
COVID-19: Quarantine and Isolation °Quarantine °If you were exposed °Quarantine and stay away from others when you have been in close contact with someone who has COVID-19. °Isolate °If you are sick or test positive °Isolate when you are sick or when you have COVID-19, even if you don't have symptoms. °When to stay home °Calculating quarantine °The date of your exposure is considered day 0. Day 1 is the first full day after your last contact with a person who has had COVID-19. Stay home and away from other people for at least 5 days. Learn why CDC updated guidance for the general public. °IF YOU were exposed to COVID-19 and are NOT  °up to dateIF YOU were exposed to COVID-19 and are NOT on COVID-19 vaccinations °Quarantine for at least 5 days °Stay home °Stay home and quarantine for at least 5 full days. °Wear a well-fitting mask if you must be around others in your home. °Do not travel. °Get tested °Even if you don't develop symptoms, get tested at least 5 days after you last had close contact with someone with COVID-19. °After quarantine °Watch for symptoms °Watch for symptoms until 10 days after you last had close contact with someone with COVID-19. °Avoid travel °It is best to avoid travel until a full 10 days after you last had close contact with someone with COVID-19. °If you develop symptoms °Isolate immediately and get tested. Continue to stay home until you know the results. Wear a well-fitting mask around others. °Take precautions until day 10 °Wear a well-fitting mask °Wear a well-fitting mask for 10 full days any time you are around others inside your home or in public. Do not go to places where you are unable to wear a well-fitting mask. °If you must travel during days 6-10, take precautions. °Avoid being around people who are more likely to get very sick from COVID-19. °IF YOU were exposed to COVID-19 and are  °up to dateIF YOU were exposed to COVID-19 and are on COVID-19 vaccinations °No  quarantine °You do not need to stay home unless you develop symptoms. °Get tested °Even if you don't develop symptoms, get tested at least 5 days after you last had close contact with someone with COVID-19. °Watch for symptoms °Watch for symptoms until 10 days after you last had close contact with someone with COVID-19. °If you develop symptoms °Isolate immediately and get tested. Continue to stay home until you know the results. Wear a well-fitting mask around others. °Take precautions until day 10 °Wear a well-fitting mask °Wear a well-fitting mask for 10 full days any time you are around others inside your home or in public. Do not go to places where you are unable to wear a well-fitting mask. °Take precautions if traveling °Avoid being around people who are more likely to get very sick from COVID-19. °IF YOU were exposed to COVID-19 and had confirmed COVID-19 within the past 90 days (you tested positive using a viral test) °No quarantine °You do not need to stay home unless you develop symptoms. °Watch for symptoms °Watch for symptoms until 10 days after you last had close contact with someone with COVID-19. °If you develop symptoms °Isolate immediately and get tested. Continue to stay home until you know the results. Wear a well-fitting mask around others. °Take precautions until day 10 °Wear a well-fitting mask °Wear a well-fitting mask for 10 full days any time you are around others inside your home or in public. Do not go to places where you are   unable to wear a well-fitting mask. °Take precautions if traveling °Avoid being around people who are more likely to get very sick from COVID-19. °Calculating isolation °Day 0 is your first day of symptoms or a positive viral test. Day 1 is the first full day after your symptoms developed or your test specimen was collected. If you have COVID-19 or have symptoms, isolate for at least 5 days. °IF YOU tested positive for COVID-19 or have symptoms, regardless of  vaccination status °Stay home for at least 5 days °Stay home for 5 days and isolate from others in your home. °Wear a well-fitting mask if you must be around others in your home. °Do not travel. °Ending isolation if you had symptoms °End isolation after 5 full days if you are fever-free for 24 hours (without the use of fever-reducing medication) and your symptoms are improving. °Ending isolation if you did NOT have symptoms °End isolation after at least 5 full days after your positive test. °If you got very sick from COVID-19 or have a weakened immune system °You should isolate for at least 10 days. Consult your doctor before ending isolation. °Take precautions until day 10 °Wear a well-fitting mask °Wear a well-fitting mask for 10 full days any time you are around others inside your home or in public. Do not go to places where you are unable to wear a well-fitting mask. °Do not travel °Do not travel until a full 10 days after your symptoms started or the date your positive test was taken if you had no symptoms. °Avoid being around people who are more likely to get very sick from COVID-19. °Definitions °Exposure °Contact with someone infected with SARS-CoV-2, the virus that causes COVID-19, in a way that increases the likelihood of getting infected with the virus. °Close contact °A close contact is someone who was less than 6 feet away from an infected person (laboratory-confirmed or a clinical diagnosis) for a cumulative total of 15 minutes or more over a 24-hour period. For example, three individual 5-minute exposures for a total of 15 minutes. People who are exposed to someone with COVID-19 after they completed at least 5 days of isolation are not considered close contacts. °Quarantine °Quarantine is a strategy used to prevent transmission of COVID-19 by keeping people who have been in close contact with someone with COVID-19 apart from others. °Who does not need to quarantine? °If you had close contact with  someone with COVID-19 and you are in one of the following groups, you do not need to quarantine. °You are up to date with your COVID-19 vaccines. °You had confirmed COVID-19 within the last 90 days (meaning you tested positive using a viral test). °If you are up to date with COVID-19 vaccines, you should wear a well-fitting mask around others for 10 days from the date of your last close contact with someone with COVID-19 (the date of last close contact is considered day 0). Get tested at least 5 days after you last had close contact with someone with COVID-19. If you test positive or develop COVID-19 symptoms, isolate from other people and follow recommendations in the Isolation section below. If you tested positive for COVID-19 with a viral test within the previous 90 days and subsequently recovered and remain without COVID-19 symptoms, you do not need to quarantine or get tested after close contact. You should wear a well-fitting mask around others for 10 days from the date of your last close contact with someone with COVID-19 (the date of last   close contact is considered day 0). If you have COVID-19 symptoms, get tested and isolate from other people and follow recommendations in the Isolation section below. °Who should quarantine? °If you come into close contact with someone with COVID-19, you should quarantine if you are not up to date on COVID-19 vaccines. This includes people who are not vaccinated. °What to do for quarantine °Stay home and away from other people for at least 5 days (day 0 through day 5) after your last contact with a person who has COVID-19. The date of your exposure is considered day 0. Wear a well-fitting mask when around others at home, if possible. °For 10 days after your last close contact with someone with COVID-19, watch for fever (100.4°F or greater), cough, shortness of breath, or other COVID-19 symptoms. °If you develop symptoms, get tested immediately and isolate until you receive  your test results. If you test positive, follow isolation recommendations. °If you do not develop symptoms, get tested at least 5 days after you last had close contact with someone with COVID-19. °If you test negative, you can leave your home, but continue to wear a well-fitting mask when around others at home and in public until 10 days after your last close contact with someone with COVID-19. °If you test positive, you should isolate for at least 5 days from the date of your positive test (if you do not have symptoms). If you do develop COVID-19 symptoms, isolate for at least 5 days from the date your symptoms began (the date the symptoms started is day 0). Follow recommendations in the isolation section below. °If you are unable to get a test 5 days after last close contact with someone with COVID-19, you can leave your home after day 5 if you have been without COVID-19 symptoms throughout the 5-day period. Wear a well-fitting mask for 10 days after your date of last close contact when around others at home and in public. °Avoid people who are have weakened immune systems or are more likely to get very sick from COVID-19, and nursing homes and other high-risk settings, until after at least 10 days. °If possible, stay away from people you live with, especially people who are at higher risk for getting very sick from COVID-19, as well as others outside your home throughout the full 10 days after your last close contact with someone with COVID-19. °If you are unable to quarantine, you should wear a well-fitting mask for 10 days when around others at home and in public. °If you are unable to wear a mask when around others, you should continue to quarantine for 10 days. Avoid people who have weakened immune systems or are more likely to get very sick from COVID-19, and nursing homes and other high-risk settings, until after at least 10 days. °See additional information about travel. °Do not go to places where you are  unable to wear a mask, such as restaurants and some gyms, and avoid eating around others at home and at work until after 10 days after your last close contact with someone with COVID-19. °After quarantine °Watch for symptoms until 10 days after your last close contact with someone with COVID-19. °If you have symptoms, isolate immediately and get tested. °Quarantine in high-risk congregate settings °In certain congregate settings that have high risk of secondary transmission (such as correctional and detention facilities, homeless shelters, or cruise ships), CDC recommends a 10-day quarantine for residents, regardless of vaccination and booster status. During periods of critical staffing   shortages, facilities may consider shortening the quarantine period for staff to ensure continuity of operations. Decisions to shorten quarantine in these settings should be made in consultation with state, local, tribal, or territorial health departments and should take into consideration the context and characteristics of the facility. CDC's setting-specific guidance provides additional recommendations for these settings. °Isolation °Isolation is used to separate people with confirmed or suspected COVID-19 from those without COVID-19. People who are in isolation should stay home until it's safe for them to be around others. At home, anyone sick or infected should separate from others, or wear a well-fitting mask when they need to be around others. People in isolation should stay in a specific "sick room" or area and use a separate bathroom if available. Everyone who has presumed or confirmed COVID-19 should stay home and isolate from other people for at least 5 full days (day 0 is the first day of symptoms or the date of the day of the positive viral test for asymptomatic persons). They should wear a mask when around others at home and in public for an additional 5 days. People who are confirmed to have COVID-19 or are showing  symptoms of COVID-19 need to isolate regardless of their vaccination status. This includes: °People who have a positive viral test for COVID-19, regardless of whether or not they have symptoms. °People with symptoms of COVID-19, including people who are awaiting test results or have not been tested. People with symptoms should isolate even if they do not know if they have been in close contact with someone with COVID-19. °What to do for isolation °Monitor your symptoms. If you have an emergency warning sign (including trouble breathing), seek emergency medical care immediately. °Stay in a separate room from other household members, if possible. °Use a separate bathroom, if possible. °Take steps to improve ventilation at home, if possible. °Avoid contact with other members of the household and pets. °Don't share personal household items, like cups, towels, and utensils. °Wear a well-fitting mask when you need to be around other people. °Learn more about what to do if you are sick and how to notify your contacts. °Ending isolation for people who had COVID-19 and had symptoms °If you had COVID-19 and had symptoms, isolate for at least 5 days. To calculate your 5-day isolation period, day 0 is your first day of symptoms. Day 1 is the first full day after your symptoms developed. You can leave isolation after 5 full days. °You can end isolation after 5 full days if you are fever-free for 24 hours without the use of fever-reducing medication and your other symptoms have improved (Loss of taste and smell may persist for weeks or months after recovery and need not delay the end of isolation). °You should continue to wear a well-fitting mask around others at home and in public for 5 additional days (day 6 through day 10) after the end of your 5-day isolation period. If you are unable to wear a mask when around others, you should continue to isolate for a full 10 days. Avoid people who have weakened immune systems or are more  likely to get very sick from COVID-19, and nursing homes and other high-risk settings, until after at least 10 days. °If you continue to have fever or your other symptoms have not improved after 5 days of isolation, you should wait to end your isolation until you are fever-free for 24 hours without the use of fever-reducing medication and your other symptoms have improved.   Continue to wear a well-fitting mask through day 10. Contact your healthcare provider if you have questions. °See additional information about travel. °Do not go to places where you are unable to wear a mask, such as restaurants and some gyms, and avoid eating around others at home and at work until a full 10 days after your first day of symptoms. °If an individual has access to a test and wants to test, the best approach is to use an antigen test1 towards the end of the 5-day isolation period. Collect the test sample only if you are fever-free for 24 hours without the use of fever-reducing medication and your other symptoms have improved (loss of taste and smell may persist for weeks or months after recovery and need not delay the end of isolation). If your test result is positive, you should continue to isolate until day 10. If your test result is negative, you can end isolation, but continue to wear a well-fitting mask around others at home and in public until day 10. Follow additional recommendations for masking and avoiding travel as described above. °1As noted in the labeling for authorized over-the counter antigen tests: Negative results should be treated as presumptive. Negative results do not rule out SARS-CoV-2 infection and should not be used as the sole basis for treatment or patient management decisions, including infection control decisions. To improve results, antigen tests should be used twice over a three-day period with at least 24 hours and no more than 48 hours between tests. °Note that these recommendations on ending isolation  do not apply to people who are moderately ill or very sick from COVID-19 or have weakened immune systems. See section below for recommendations for when to end isolation for these groups. °Ending isolation for people who tested positive for COVID-19 but had no symptoms °If you test positive for COVID-19 and never develop symptoms, isolate for at least 5 days. Day 0 is the day of your positive viral test (based on the date you were tested) and day 1 is the first full day after the specimen was collected for your positive test. You can leave isolation after 5 full days. °If you continue to have no symptoms, you can end isolation after at least 5 days. °You should continue to wear a well-fitting mask around others at home and in public until day 10 (day 6 through day 10). If you are unable to wear a mask when around others, you should continue to isolate for 10 days. Avoid people who have weakened immune systems or are more likely to get very sick from COVID-19, and nursing homes and other high-risk settings, until after at least 10 days. °If you develop symptoms after testing positive, your 5-day isolation period should start over. Day 0 is your first day of symptoms. Follow the recommendations above for ending isolation for people who had COVID-19 and had symptoms. °See additional information about travel. °Do not go to places where you are unable to wear a mask, such as restaurants and some gyms, and avoid eating around others at home and at work until 10 days after the day of your positive test. °If an individual has access to a test and wants to test, the best approach is to use an antigen test1 towards the end of the 5-day isolation period. If your test result is positive, you should continue to isolate until day 10. If your test result is positive, you can also choose to test daily and if your test result   is negative, you can end isolation, but continue to wear a well-fitting mask around others at home and in  public until day 10. Follow additional recommendations for masking and avoiding travel as described above. °1As noted in the labeling for authorized over-the counter antigen tests: Negative results should be treated as presumptive. Negative results do not rule out SARS-CoV-2 infection and should not be used as the sole basis for treatment or patient management decisions, including infection control decisions. To improve results, antigen tests should be used twice over a three-day period with at least 24 hours and no more than 48 hours between tests. °Ending isolation for people who were moderately or very sick from COVID-19 or have a weakened immune system °People who are moderately ill from COVID-19 (experiencing symptoms that affect the lungs like shortness of breath or difficulty breathing) should isolate for 10 days and follow all other isolation precautions. To calculate your 10-day isolation period, day 0 is your first day of symptoms. Day 1 is the first full day after your symptoms developed. If you are unsure if your symptoms are moderate, talk to a healthcare provider for further guidance. °People who are very sick from COVID-19 (this means people who were hospitalized or required intensive care or ventilation support) and people who have weakened immune systems might need to isolate at home longer. They may also require testing with a viral test to determine when they can be around others. CDC recommends an isolation period of at least 10 and up to 20 days for people who were very sick from COVID-19 and for people with weakened immune systems. Consult with your healthcare provider about when you can resume being around other people. If you are unsure if your symptoms are severe or if you have a weakened immune system, talk to a healthcare provider for further guidance. °People who have a weakened immune system should talk to their healthcare provider about the potential for reduced immune responses to  COVID-19 vaccines and the need to continue to follow current prevention measures (including wearing a well-fitting mask and avoiding crowds and poorly ventilated indoor spaces) to protect themselves against COVID-19 until advised otherwise by their healthcare provider. Close contacts of immunocompromised people--including household members--should also be encouraged to receive all recommended COVID-19 vaccine doses to help protect these people. °Isolation in high-risk congregate settings °In certain high-risk congregate settings that have high risk of secondary transmission and where it is not feasible to cohort people (such as correctional and detention facilities, homeless shelters, and cruise ships), CDC recommends a 10-day isolation period for residents. During periods of critical staffing shortages, facilities may consider shortening the isolation period for staff to ensure continuity of operations. Decisions to shorten isolation in these settings should be made in consultation with state, local, tribal, or territorial health departments and should take into consideration the context and characteristics of the facility. CDC's setting-specific guidance provides additional recommendations for these settings. °This CDC guidance is meant to supplement--not replace--any federal, state, local, territorial, or tribal health and safety laws, rules, and regulations. °Recommendations for specific settings °These recommendations do not apply to healthcare professionals. For guidance specific to these settings, see °Healthcare professionals: Interim Guidance for Managing Healthcare Personnel with SARS-CoV-2 Infection or Exposure to SARS-CoV-2 °Patients, residents, and visitors to healthcare settings: Interim Infection Prevention and Control Recommendations for Healthcare Personnel During the Coronavirus Disease 2019 (COVID-19) Pandemic °Additional setting-specific guidance and recommendations are available. °These  recommendations on quarantine and isolation do apply to K[employer] School   settings. Additional guidance is available here: Overview of COVID-19 Quarantine for K-12 Schools °Travelers: Travel information and recommendations °Congregate facilities and other settings: guidance pages for community, work, and school settings °Ongoing COVID-19 exposure FAQs °I live with someone with COVID-19, but I cannot be separated from them. How do we manage quarantine in this situation? °It is very important for people with COVID-19 to remain apart from other people, if possible, even if they are living together. If separation of the person with COVID-19 from others that they live with is not possible, the other people that they live with will have ongoing exposure, meaning they will be repeatedly exposed until that person is no longer able to spread the virus to other people. In this situation, there are precautions you can take to limit the spread of COVID-19: °The person with COVID-19 and everyone they live with should wear a well-fitting mask inside the home. °If possible, one person should care for the person with COVID-19 to limit the number of people who are in close contact with the infected person. °Take steps to protect yourself and others to reduce transmission in the home: °Quarantine if you are not up to date with your COVID-19 vaccines. °Isolate if you are sick or tested positive for COVID-19, even if you don't have symptoms. °Learn more about the public health recommendations for testing, mask use and quarantine of close contacts, like yourself, who have ongoing exposure. These recommendations differ depending on your vaccination status. °What should I do if I have ongoing exposure to COVID-19 from someone I live with? °Recommendations for this situation depend on your vaccination status: °If you are not up to date on COVID-19 vaccines and have ongoing exposure to COVID-19, you should: °Begin quarantine immediately and  continue to quarantine throughout the isolation period of the person with COVID-19. °Continue to quarantine for an additional 5 days starting the day after the end of isolation for the person with COVID-19. °Get tested at least 5 days after the end of isolation of the infected person that lives with them. °If you test negative, you can leave the home but should continue to wear a well-fitting mask when around others at home and in public until 10 days after the end of isolation for the person with COVID-19. °Isolate immediately if you develop symptoms of COVID-19 or test positive. °If you are up to date with COVID-19 vaccines and have ongoing exposure to COVID-19, you should: °Get tested at least 5 days after your first exposure. A person with COVID-19 is considered infectious starting 2 days before they develop symptoms, or 2 days before the date of their positive test if they do not have symptoms. °Get tested again at least 5 days after the end of isolation for the person with COVID-19. °Wear a well-fitting mask when you are around the person with COVID-19, and do this throughout their isolation period. °Wear a well-fitting mask around others for 10 days after the infected person's isolation period ends. °Isolate immediately if you develop symptoms of COVID-19 or test positive. °What should I do if multiple people I live with test positive for COVID-19 at different times? °Recommendations for this situation depend on your vaccination status: °If you are not up to date with your COVID-19 vaccines, you should: °Quarantine throughout the isolation period of any infected person that you live with. °Continue to quarantine until 5 days after the end of isolation date for the most recently infected person that lives with you. For example, if   the last day of isolation of the person most recently infected with COVID-19 was June 30, the new 5-day quarantine period starts on July 1. °Get tested at least 5 days after the end  of isolation for the most recently infected person that lives with you. °Wear a well-fitting mask when you are around any person with COVID-19 while that person is in isolation. °Wear a well-fitting mask when you are around other people until 10 days after your last close contact. °Isolate immediately if you develop symptoms of COVID-19 or test positive. °If you are up to date with your COVID-19 vaccines, you should: °Get tested at least 5 days after your first exposure. A person with COVID-19 is considered infectious starting 2 days before they developed symptoms, or 2 days before the date of their positive test if they do not have symptoms. °Get tested again at least 5 days after the end of isolation for the most recently infected person that lives with you. °Wear a well-fitting mask when you are around any person with COVID-19 while that person is in isolation. °Wear a well-fitting mask around others for 10 days after the end of isolation for the most recently infected person that lives with you. For example, if the last day of isolation for the person most recently infected with COVID-19 was June 30, the new 10-day period to wear a well-fitting mask indoors in public starts on July 1. °Isolate immediately if you develop symptoms of COVID-19 or test positive. °I had COVID-19 and completed isolation. Do I have to quarantine or get tested if someone I live with gets COVID-19 shortly after I completed isolation? °No. If you recently completed isolation and someone that lives with you tests positive for the virus that causes COVID-19 shortly after the end of your isolation period, you do not have to quarantine or get tested as long as you do not develop new symptoms. Once all of the people that live together have completed isolation or quarantine, refer to the guidance below for new exposures to COVID-19. °If you had COVID-19 in the previous 90 days and then came into close contact with someone with COVID-19, you do  not have to quarantine or get tested if you do not have symptoms. But you should: °Wear a well-fitting mask indoors in public for 10 days after your last close contact. °Monitor for COVID-19 symptoms for 10 days from the date of your last close contact. °Isolate immediately and get tested if symptoms develop. °If more than 90 days have passed since your recovery from infection, follow CDC's recommendations for close contacts. These recommendations will differ depending on your vaccination status. °07/17/2020 °Content source: National Center for Immunization and Respiratory Diseases (NCIRD), Division of Viral Diseases °This information is not intended to replace advice given to you by your health care provider. Make sure you discuss any questions you have with your health care provider. °Document Revised: 11/20/2020 Document Reviewed: 11/20/2020 °Elsevier Patient Education © 2022 Elsevier Inc. ° °

## 2021-04-29 NOTE — Progress Notes (Signed)
Subjective:    Patient ID: Diana [name], female    DOB: 1960-08-08, 61 y.o.   MRN: [id number]  HPI  Patient presents to clinic today for follow-up.  She initially did an E-visit 12/29 for flulike symptoms.  She was prescribed Tamiflu but never started this medication.  She subsequently tested positive and was seen virtually by PCP 1/3.  She described worsening GERD symptoms and nausea.  She was prescribed Promethazine DM and advised to increase her Omeprazole to 40 mg 2 times daily.  She did another E-visit 1/5 with complaints of epigastric pain.  It was recommended she go to urgent care or ER for further evaluation.  She went to urgent care 1/5.  ECG and chest x-ray at that time were normal.  She was referred to the ER to rule out PE.  CBC and troponin were unremarkable.  Her c-Met revealed hypokalemia 3.1.  She ended up leaving without being seen.  She reports she is feeling weak, runny nose, nasal congestion, she feels lightheaded when she coughs, itching, nausea and epigastric pain. She is blowing blood tinged yellow mucous out of her nose. The cough is productive of yellow mucous. She denies headache, ear pain, sore throat, shortness of breath, chest pain, vomiting, diarrhea or blood in her stool. She denies fever, chills or body aches. She is taking Ibuprofen and Delsym.  She does have FMLA forms that she needs to be completed. She has been out since 12/30. Went back to work on 1/5 but ended up going to the ER. She would like to return to work tomorrow.  Review of Systems     Past Medical History:  Diagnosis Date   Allergy    Ear infection    recurrent    Current Outpatient Medications  Medication Sig Dispense Refill   Ascorbic Acid 125 MG CHEW Chew by mouth.     cetirizine (ZYRTEC) 10 MG tablet Take 10 mg by mouth daily.     Cholecalciferol (VITAMIN D3) 25 MCG (1000 UT) CAPS Take 5,000 Units by mouth.      clonazePAM (KLONOPIN) 0.5 MG tablet Take 1 tablet (0.5 mg total) by mouth  daily as needed. for anxiety 30 tablet 0   hydrochlorothiazide (MICROZIDE) 12.5 MG capsule Take 12.5 mg by mouth daily.     hydrocortisone (ANUSOL-HC) 2.5 % rectal cream PLACE 1 APPLICATION RECTALLY 2 TIMES DAILY. 30 g 0   omeprazole (PRILOSEC) 40 MG capsule Take 1 capsule (40 mg total) by mouth 2 (two) times daily. 180 capsule 0   oseltamivir (TAMIFLU) 75 MG capsule Take 1 capsule (75 mg total) by mouth 2 (two) times daily. 10 capsule 0   promethazine-dextromethorphan (PROMETHAZINE-DM) 6.25-15 MG/5ML syrup Take 5 mLs by mouth 4 (four) times daily as needed for cough. 118 mL 0   triamcinolone cream (KENALOG) 0.1 % Apply 1 application topically 2 (two) times daily. 30 g 0   Zinc 50 MG CAPS Take by mouth.     No current facility-administered medications for this visit.    No Known Allergies  Family History  Problem Relation Age of Onset   Hypertension Mother        alive   Arthritis Mother    Lung cancer Father        deceased   Heart disease Father    Other Other        1 brother and 1 sister both alive an healthy   Hypertension Maternal Grandmother     Social History  Socioeconomic History   Marital status: Widowed    Spouse name: Not on file   Number of children: 3   Years of education: Not on file   Highest education level: Not on file  Occupational History   Occupation: customer service    Employer: [employer]  Tobacco Use   Smoking status: Former    Types: Cigarettes    Quit date: 04/20/1985    Years since quitting: 36.0   Smokeless tobacco: Never   Tobacco comments:    remote tobacco use   Vaping Use   Vaping Use: Never used  Substance and Sexual Activity   Alcohol use: No    Alcohol/week: 0.0 standard drinks   Drug use: No   Sexual activity: Never  Other Topics Concern   Not on file  Social History Narrative   Not on file   Social Determinants of Health   Financial Resource Strain: Not on file  Food Insecurity: Not on file  Transportation Needs:  Not on file  Physical Activity: Not on file  Stress: Not on file  Social Connections: Not on file  Intimate Partner Violence: Not on file     Constitutional: Denies fever, malaise, fatigue, headache or abrupt weight changes.  HEENT: Pt reports runny nose, nasal congestion. Denies eye pain, eye redness, ear pain, ringing in the ears, wax buildup, bloody nose, or sore throat. Respiratory: Pt reports cough. Denies difficulty breathing, shortness of breath.   Cardiovascular: Denies chest pain, chest tightness, palpitations or swelling in the hands or feet.  Gastrointestinal: Pt reports nausea and epigastric pain. Denies bloating, constipation, diarrhea or blood in the stool.  Skin: Pt reports itching. Denies redness, rashes, lesions or ulcercations.  Neurological: Pt reports lightheadedness. Denies difficulty with memory, difficulty with speech or problems with balance and coordination.    No other specific complaints in a complete review of systems (except as listed in HPI above).  Objective:   Physical Exam  BP 137/79 (BP Location: Right Arm, Patient Position: Sitting, Cuff Size: Large)    Pulse 71    Temp (!) 97.3 F (36.3 C) (Temporal)    Resp 18    Ht _0  (1.702 m)    Wt 209 lb 3.2 oz (94.9 kg)    LMP 01/20/2016    SpO2 100%    BMI 32.77 kg/m   Wt Readings from Last 3 Encounters:  04/24/21 206 lb (93.4 kg)  03/28/21 217 lb (98.4 kg)  03/06/21 216 lb 9.6 oz (98.2 kg)    General: Appears her stated age, obese, in NAD. Skin: Warm, dry and intact. No rashes noted. HEENT: Head: normal shape and size; Eyes: EOMs intact;  Neck:  No adenopathy noted. Cardiovascular: Normal rate and rhythm. S1,S2 noted.  No murmur, rubs or gallops noted.  Pulmonary/Chest: Normal effort and positive vesicular breath sounds. No respiratory distress. No wheezes, rales or ronchi noted.  Abdomen: Soft and nontender. Normal bowel sounds.  Musculoskeletal:  No difficulty with gait.  Neurological: Alert and  oriented.    BMET    Component Value Date/Time   NA 137 04/24/2021 1843   K 3.1 (L) 04/24/2021 1843   CL 94 (L) 04/24/2021 1843   CO2 33 (H) 04/24/2021 1843   GLUCOSE 119 (H) 04/24/2021 1843   BUN 5 (L) 04/24/2021 1843   CREATININE 0.57 04/24/2021 1843   CREATININE 0.59 02/06/2021 0941   CALCIUM 9.0 04/24/2021 1843   GFRNONAA >60 04/24/2021 1843   GFRAA >60 07/27/2017 2153  Lipid Panel     Component Value Date/Time   CHOL 171 02/18/2021 1016   TRIG 68 02/18/2021 1016   HDL 27 (L) 02/18/2021 1016   CHOLHDL 6.3 (H) 02/18/2021 1016   VLDL 21.0 02/09/2019 1245   LDLCALC 128 (H) 02/18/2021 1016    CBC    Component Value Date/Time   WBC 3.6 (L) 04/24/2021 1843   RBC 4.84 04/24/2021 1843   HGB 14.9 04/24/2021 1843   HCT 42.7 04/24/2021 1843   PLT 208 04/24/2021 1843   MCV 88.2 04/24/2021 1843   MCH 30.8 04/24/2021 1843   MCHC 34.9 04/24/2021 1843   RDW 13.1 04/24/2021 1843   LYMPHSABS 1.1 07/28/2016 1232   MONOABS 0.4 07/28/2016 1232   EOSABS 0.0 07/28/2016 1232   BASOSABS 0.0 07/28/2016 1232    Hgb A1C Lab Results  Component Value Date   HGBA1C 5.9 (H) 02/18/2021            Assessment & Plan:   Follow Up for Epigastric Pain, Nausea, GERD, Weakness, Cough, Hypokalemia secondary to Covid 19:  ER/UC/telehealth notes, labs and imaging reviewed She is feeling better, just some residual weakness and cough She will take Delsym during the day, Promethazine DM at night Ok to take Benadryl as needed for itching Continue Omeprazole 40 mg BID Avoid Ibuprofen while having abdominal pain as this can make this worse CMET today FMLA forms will be completed and faxed back per request  Will follow up after labs with further recommendation and treatment plan    Webb Silversmith, NP This visit occurred during the SARS-CoV-2 public health emergency.  Safety protocols were in place, including screening questions prior to the visit, additional usage of staff PPE, and  extensive cleaning of exam room while observing appropriate contact time as indicated for disinfecting solutions.

## 2021-04-30 ENCOUNTER — Encounter: Payer: Self-pay | Admitting: Internal Medicine

## 2021-04-30 LAB — COMPLETE METABOLIC PANEL WITH GFR
AG Ratio: 1.7 (calc) (ref 1.0–2.5)
ALT: 13 U/L (ref 6–29)
AST: 17 U/L (ref 10–35)
Albumin: 4.1 g/dL (ref 3.6–5.1)
Alkaline phosphatase (APISO): 71 U/L (ref 37–153)
BUN: 11 mg/dL (ref 7–25)
CO2: 39 mmol/L — ABNORMAL HIGH (ref 20–32)
Calcium: 9.4 mg/dL (ref 8.6–10.4)
Chloride: 97 mmol/L — ABNORMAL LOW (ref 98–110)
Creat: 0.67 mg/dL (ref 0.50–1.03)
Globulin: 2.4 g/dL (calc) (ref 1.9–3.7)
Glucose, Bld: 126 mg/dL (ref 65–139)
Potassium: 3.1 mmol/L — ABNORMAL LOW (ref 3.5–5.3)
Sodium: 144 mmol/L (ref 135–146)
Total Bilirubin: 1 mg/dL (ref 0.2–1.2)
Total Protein: 6.5 g/dL (ref 6.1–8.1)
eGFR: 101 mL/min/{1.73_m2} (ref >=60)

## 2021-05-01 MED ORDER — POTASSIUM CHLORIDE CRYS ER 10 MEQ PO TBCR: 10.0000 meq | EXTENDED_RELEASE_TABLET | Freq: Every day | ORAL | 0 refills | Status: DC

## 2021-05-14 ENCOUNTER — Ambulatory Visit: Payer: BC Managed Care – PPO | Admitting: Internal Medicine

## 2021-05-14 ENCOUNTER — Other Ambulatory Visit: Payer: Self-pay

## 2021-05-14 ENCOUNTER — Encounter: Payer: Self-pay | Admitting: Internal Medicine

## 2021-05-14 VITALS — BP 118/86 | HR 69 | Ht 67.0 in | Wt 211.0 lb

## 2021-05-14 DIAGNOSIS — K2100 Gastro-esophageal reflux disease with esophagitis, without bleeding: Secondary | ICD-10-CM

## 2021-05-14 DIAGNOSIS — R0789 Other chest pain: Secondary | ICD-10-CM | POA: Diagnosis not present

## 2021-05-14 DIAGNOSIS — K5909 Other constipation: Secondary | ICD-10-CM

## 2021-05-14 DIAGNOSIS — M549 Dorsalgia, unspecified: Secondary | ICD-10-CM

## 2021-05-14 DIAGNOSIS — R52 Pain, unspecified: Secondary | ICD-10-CM | POA: Diagnosis not present

## 2021-05-14 DIAGNOSIS — Z8616 Personal history of COVID-19: Secondary | ICD-10-CM

## 2021-05-14 NOTE — Patient Instructions (Signed)

## 2021-05-14 NOTE — Progress Notes (Signed)
Subjective:    Patient ID: Diana [name], female    DOB: Nov 17, 1960, 61 y.o.   MRN: [id number]  HPI  Pt presents to the clinic today with c/o persistent reflux.  This has been an ongoing issue since COVID.  She reports she burps with residual regurgitation.  She has chest pain which she describes as burning.  This pain can wake her up in the middle of the night.  She also reports upper back pain from her mid back to her shoulders.  She denies hoarseness, sore throat, nausea, vomiting or diarrhea.  She has constipation at baseline.  She has had a slight cough which is nonproductive but denies pain with taking a deep breath or shortness of breath.  She denies headache, dizziness, abdominal pain, urinary or vaginal symptoms, other muscle or joint pain.  She is taking Omeprazole 2 times daily as prescribed.  She has taken Ibuprofen OTC with some relief of symptoms.  There is no upper GI on file.    Review of Systems     Past Medical History:  Diagnosis Date   Allergy    Ear infection    recurrent    Current Outpatient Medications  Medication Sig Dispense Refill   Ascorbic Acid 125 MG CHEW Chew by mouth.     cetirizine (ZYRTEC) 10 MG tablet Take 10 mg by mouth daily.     Cholecalciferol (VITAMIN D3) 25 MCG (1000 UT) CAPS Take 5,000 Units by mouth.      clonazePAM (KLONOPIN) 0.5 MG tablet Take 1 tablet (0.5 mg total) by mouth daily as needed. for anxiety 30 tablet 0   dextromethorphan (DELSYM) 30 MG/5ML liquid Take by mouth as needed for cough.     hydrochlorothiazide (MICROZIDE) 12.5 MG capsule Take 12.5 mg by mouth daily.     hydrocortisone (ANUSOL-HC) 2.5 % rectal cream PLACE 1 APPLICATION RECTALLY 2 TIMES DAILY. 30 g 0   omeprazole (PRILOSEC) 40 MG capsule Take 1 capsule (40 mg total) by mouth 2 (two) times daily. 180 capsule 0   potassium chloride (KLOR-CON M) 10 MEQ tablet Take 1 tablet (10 mEq total) by mouth daily. 90 tablet 0   promethazine-dextromethorphan (PROMETHAZINE-DM)  6.25-15 MG/5ML syrup Take 5 mLs by mouth 4 (four) times daily as needed for cough. (Patient not taking: Reported on 04/29/2021) 118 mL 0   triamcinolone cream (KENALOG) 0.1 % Apply 1 application topically 2 (two) times daily. (Patient not taking: Reported on 04/29/2021) 30 g 0   Zinc 50 MG CAPS Take by mouth.     No current facility-administered medications for this visit.    No Known Allergies  Family History  Problem Relation Age of Onset   Hypertension Mother        alive   Arthritis Mother    Lung cancer Father        deceased   Heart disease Father    Other Other        1 brother and 1 sister both alive an healthy   Hypertension Maternal Grandmother     Social History   Socioeconomic History   Marital status: Widowed    Spouse name: Not on file   Number of children: 3   Years of education: Not on file   Highest education level: Not on file  Occupational History   Occupation: customer service    Employer: [employer]  Tobacco Use   Smoking status: Former    Types: Cigarettes    Quit date: 04/20/1985  Years since quitting: 36.0   Smokeless tobacco: Never   Tobacco comments:    remote tobacco use   Vaping Use   Vaping Use: Never used  Substance and Sexual Activity   Alcohol use: No    Alcohol/week: 0.0 standard drinks   Drug use: No   Sexual activity: Never  Other Topics Concern   Not on file  Social History Narrative   Not on file   Social Determinants of Health   Financial Resource Strain: Not on file  Food Insecurity: Not on file  Transportation Needs: Not on file  Physical Activity: Not on file  Stress: Not on file  Social Connections: Not on file  Intimate Partner Violence: Not on file     Constitutional: Denies fever, malaise, fatigue, headache or abrupt weight changes.  HEENT: Denies eye pain, eye redness, ear pain, ringing in the ears, wax buildup, runny nose, nasal congestion, bloody nose, or sore throat. Respiratory: Patient reports  cough.  Denies difficulty breathing, shortness of breath, or sputum production.   Cardiovascular: Patient reports chest pain.  Denies chest tightness, palpitations or swelling in the hands or feet.  Gastrointestinal: Patient reports constipation.  Denies abdominal pain, bloating, diarrhea or blood in the stool.  GU: Denies urgency, frequency, pain with urination, burning sensation, blood in urine, odor or discharge. Musculoskeletal: Patient reports upper back pain.  Denies decrease in range of motion, difficulty with gait, or joint pain and swelling.  Skin: Denies redness, rashes, lesions or ulcercations.  Neurological: Denies dizziness, difficulty with memory, difficulty with speech or problems with balance and coordination.    No other specific complaints in a complete review of systems (except as listed in HPI above).  Objective:   Physical Exam  BP 118/86 (BP Location: Right Arm, Patient Position: Sitting, Cuff Size: Large)    Pulse 69    Ht 5\' 7"  (1.702 m)    Wt 211 lb (95.7 kg)    LMP 01/20/2016    SpO2 94%    BMI 33.05 kg/m   Wt Readings from Last 3 Encounters:  04/29/21 209 lb 3.2 oz (94.9 kg)  04/24/21 206 lb (93.4 kg)  03/28/21 217 lb (98.4 kg)    General: Appears her stated age, obese, in NAD. Skin: Warm, dry and intact. No rashes noted. Cardiovascular: Normal rate and rhythm. S1,S2 noted.  No murmur, rubs or gallops noted.  Pulmonary/Chest: Normal effort and positive vesicular breath sounds. No respiratory distress. No wheezes, rales or ronchi noted.  Abdomen: Soft and nontender. Normal bowel sounds. No distention or masses noted.  Musculoskeletal: Chest wall nontender with palpation.  No bony tenderness over the thoracic spine or parathoracic muscles.  Shoulder shrug equal.  Strength 5/5 BUE. Neurological: Alert and oriented.   BMET    Component Value Date/Time   NA 144 04/29/2021 1342   K 3.1 (L) 04/29/2021 1342   CL 97 (L) 04/29/2021 1342   CO2 39 (H) 04/29/2021  1342   GLUCOSE 126 04/29/2021 1342   BUN 11 04/29/2021 1342   CREATININE 0.67 04/29/2021 1342   CALCIUM 9.4 04/29/2021 1342   GFRNONAA >60 04/24/2021 1843   GFRAA >60 07/27/2017 2153    Lipid Panel     Component Value Date/Time   CHOL 171 02/18/2021 1016   TRIG 68 02/18/2021 1016   HDL 27 (L) 02/18/2021 1016   CHOLHDL 6.3 (H) 02/18/2021 1016   VLDL 21.0 02/09/2019 1245   LDLCALC 128 (H) 02/18/2021 1016    CBC  Component Value Date/Time   WBC 3.6 (L) 04/24/2021 1843   RBC 4.84 04/24/2021 1843   HGB 14.9 04/24/2021 1843   HCT 42.7 04/24/2021 1843   PLT 208 04/24/2021 1843   MCV 88.2 04/24/2021 1843   MCH 30.8 04/24/2021 1843   MCHC 34.9 04/24/2021 1843   RDW 13.1 04/24/2021 1843   LYMPHSABS 1.1 07/28/2016 1232   MONOABS 0.4 07/28/2016 1232   EOSABS 0.0 07/28/2016 1232   BASOSABS 0.0 07/28/2016 1232    Hgb A1C Lab Results  Component Value Date   HGBA1C 5.9 (H) 02/18/2021           Assessment & Plan:   GERD, Chest Pain, Chronic Constipation, Upper Back Pain, History of COVID:  ?  If COVID has made her reflux worse or caused inflammation in her body We have increased Omeprazole to 40 mg to twice daily with some relief of symptoms but still has persistent issues She has been on Carafate in the past and does not really want to restart this at this time Could consider adding Famotidine 10 to 20 mg at bedtime We will check CBC, c-Met, H. pylori stool antigen, ESR, CRP and ANA Advised her to go ahead and call her GI doctor and schedule a follow-up appointment for further evaluation  We will follow-up after labs with further recommendation and treatment plan  Webb Silversmith, NP This visit occurred during the SARS-CoV-2 public health emergency.  Safety protocols were in place, including screening questions prior to the visit, additional usage of staff PPE, and extensive cleaning of exam room while observing appropriate contact time as indicated for disinfecting  solutions.

## 2021-05-16 LAB — COMPLETE METABOLIC PANEL WITH GFR
AG Ratio: 1.7 (calc) (ref 1.0–2.5)
ALT: 10 U/L (ref 6–29)
AST: 12 U/L (ref 10–35)
Albumin: 4.2 g/dL (ref 3.6–5.1)
Alkaline phosphatase (APISO): 66 U/L (ref 37–153)
BUN: 10 mg/dL (ref 7–25)
CO2: 37 mmol/L — ABNORMAL HIGH (ref 20–32)
Calcium: 9.4 mg/dL (ref 8.6–10.4)
Chloride: 101 mmol/L (ref 98–110)
Creat: 0.66 mg/dL (ref 0.50–1.03)
Globulin: 2.5 g/dL (calc) (ref 1.9–3.7)
Glucose, Bld: 120 mg/dL (ref 65–139)
Potassium: 3.3 mmol/L — ABNORMAL LOW (ref 3.5–5.3)
Sodium: 143 mmol/L (ref 135–146)
Total Bilirubin: 1.3 mg/dL — ABNORMAL HIGH (ref 0.2–1.2)
Total Protein: 6.7 g/dL (ref 6.1–8.1)
eGFR: 102 mL/min/{1.73_m2} (ref >=60)

## 2021-05-16 LAB — ANA: Anti Nuclear Antibody (ANA): NEGATIVE

## 2021-05-16 LAB — CBC
HCT: 40.2 % (ref 35.0–45.0)
Hemoglobin: 13.2 g/dL (ref 11.7–15.5)
MCH: 30.2 pg (ref 27.0–33.0)
MCHC: 32.8 g/dL (ref 32.0–36.0)
MCV: 92 fL (ref 80.0–100.0)
MPV: 12.2 fL (ref 7.5–12.5)
Platelets: 213 10*3/uL (ref 140–400)
RBC: 4.37 10*6/uL (ref 3.80–5.10)
RDW: 13.3 % (ref 11.0–15.0)
WBC: 4.4 10*3/uL (ref 3.8–10.8)

## 2021-05-16 LAB — SEDIMENTATION RATE: Sed Rate: 9 mm/h (ref 0–30)

## 2021-05-16 LAB — C-REACTIVE PROTEIN: CRP: 1.3 mg/L (ref <8.0)

## 2021-05-26 ENCOUNTER — Other Ambulatory Visit: Payer: Self-pay | Admitting: Internal Medicine

## 2021-05-26 DIAGNOSIS — K644 Residual hemorrhoidal skin tags: Secondary | ICD-10-CM

## 2021-05-27 NOTE — Telephone Encounter (Signed)
Requested medication (s) are due for refill today: Yes  Requested medication (s) are on the active medication list: Yes  Last refill:  03/25/21  Future visit scheduled: No  Notes to clinic:  See request. Not delegated.    Requested Prescriptions  Pending Prescriptions Disp Refills   hydrocortisone (ANUSOL-HC) 2.5 % rectal cream [Pharmacy Med Name: HYDROCORTISONE 2.5% CREAM 2.5 Cream] 30 g 0    Sig: PLACE 1 APPLICATION RECTALLY 2 TIMES DAILY.     Not Delegated - Over the Counter: OTC 2 Failed - 05/26/2021  2:59 PM      Failed - This refill cannot be delegated      Passed - Valid encounter within last 12 months    Recent Outpatient Visits           1 week ago Upper back pain   [facility] [place], Salvadore Oxford, NP   4 weeks ago Hypokalemia   [facility] [place], Salvadore Oxford, NP   1 month ago COVID-19   [facility], Salvadore Oxford, NP   2 months ago Chronic fatigue   [facility] [place], [place] W, NP   2 months ago Gastroesophageal reflux disease without esophagitis   [facility], Salvadore Oxford, NP       Future Appointments             In 2 months Vanga, Loel Dubonnet, MD [facility] [place]

## 2021-06-04 ENCOUNTER — Other Ambulatory Visit: Payer: Self-pay | Admitting: Internal Medicine

## 2021-06-04 DIAGNOSIS — K644 Residual hemorrhoidal skin tags: Secondary | ICD-10-CM

## 2021-06-04 NOTE — Telephone Encounter (Signed)
Requested medications are due for refill today.  no  Requested medications are on the active medications list.  yes  Last refill. 05/27/2021  Future visit scheduled.   no  Notes to clinic.  Medication not delegated. Refilled 05/27/2021 - same pharmacy as this request.    Requested Prescriptions  Pending Prescriptions Disp Refills   hydrocortisone (ANUSOL-HC) 2.5 % rectal cream [Pharmacy Med Name: HYDROCORTISONE 2.5% CREAM 2.5 Cream] 30 g 0    Sig: PLACE 1 APPLICATION RECTALLY 2 TIMES DAILY.     Not Delegated - Over the Counter: OTC 2 Failed - 06/04/2021  5:27 PM      Failed - This refill cannot be delegated      Passed - Valid encounter within last 12 months    Recent Outpatient Visits           3 weeks ago Upper back pain   [facility] [place], Salvadore Oxford, NP   1 month ago Hypokalemia   [facility] [place], Salvadore Oxford, NP   1 month ago COVID-19   [facility] [place], Salvadore Oxford, NP   2 months ago Chronic fatigue   [facility] [place], [place] W, NP   3 months ago Gastroesophageal reflux disease without esophagitis   [facility] [place], Salvadore Oxford, NP       Future Appointments             In 1 month Vanga, Loel Dubonnet, MD [facility] [place]

## 2021-07-02 ENCOUNTER — Emergency Department (HOSPITAL_BASED_OUTPATIENT_CLINIC_OR_DEPARTMENT_OTHER)
Admission: EM | Admit: 2021-07-02 | Discharge: 2021-07-02 | Disposition: A | Discharge status: Home / Self Care | Payer: BC Managed Care – PPO | Attending: Emergency Medicine | Admitting: Emergency Medicine

## 2021-07-02 ENCOUNTER — Encounter (HOSPITAL_BASED_OUTPATIENT_CLINIC_OR_DEPARTMENT_OTHER): Payer: Self-pay

## 2021-07-02 ENCOUNTER — Emergency Department (HOSPITAL_BASED_OUTPATIENT_CLINIC_OR_DEPARTMENT_OTHER): Payer: BC Managed Care – PPO

## 2021-07-02 ENCOUNTER — Other Ambulatory Visit: Payer: Self-pay

## 2021-07-02 ENCOUNTER — Ambulatory Visit: Payer: Self-pay | Admitting: *Deleted

## 2021-07-02 DIAGNOSIS — I10 Essential (primary) hypertension: Secondary | ICD-10-CM | POA: Insufficient documentation

## 2021-07-02 DIAGNOSIS — R531 Weakness: Secondary | ICD-10-CM

## 2021-07-02 DIAGNOSIS — R0789 Other chest pain: Secondary | ICD-10-CM | POA: Diagnosis not present

## 2021-07-02 DIAGNOSIS — R1010 Upper abdominal pain, unspecified: Secondary | ICD-10-CM | POA: Diagnosis not present

## 2021-07-02 DIAGNOSIS — R1084 Generalized abdominal pain: Secondary | ICD-10-CM | POA: Diagnosis not present

## 2021-07-02 DIAGNOSIS — E876 Hypokalemia: Secondary | ICD-10-CM

## 2021-07-02 DIAGNOSIS — R072 Precordial pain: Secondary | ICD-10-CM | POA: Diagnosis not present

## 2021-07-02 DIAGNOSIS — R079 Chest pain, unspecified: Secondary | ICD-10-CM | POA: Diagnosis not present

## 2021-07-02 DIAGNOSIS — M6281 Muscle weakness (generalized): Secondary | ICD-10-CM | POA: Diagnosis not present

## 2021-07-02 LAB — COMPREHENSIVE METABOLIC PANEL
ALT: 13 U/L (ref 0–44)
AST: 18 U/L (ref 15–41)
Albumin: 4.2 g/dL (ref 3.5–5.0)
Alkaline Phosphatase: 72 U/L (ref 38–126)
Anion gap: 11 (ref 5–15)
BUN: 14 mg/dL (ref 6–20)
CO2: 30 mmol/L (ref 22–32)
Calcium: 9.3 mg/dL (ref 8.9–10.3)
Chloride: 100 mmol/L (ref 98–111)
Creatinine, Ser: 0.64 mg/dL (ref 0.44–1.00)
GFR, Estimated: >60 mL/min (ref >60)
Glucose, Bld: 109 mg/dL — ABNORMAL HIGH (ref 70–99)
Potassium: 3.2 mmol/L — ABNORMAL LOW (ref 3.5–5.1)
Sodium: 141 mmol/L (ref 135–145)
Total Bilirubin: 1.3 mg/dL — ABNORMAL HIGH (ref 0.3–1.2)
Total Protein: 7 g/dL (ref 6.5–8.1)

## 2021-07-02 LAB — CBC
HCT: 41.5 % (ref 36.0–46.0)
Hemoglobin: 14.1 g/dL (ref 12.0–15.0)
MCH: 29.8 pg (ref 26.0–34.0)
MCHC: 34 g/dL (ref 30.0–36.0)
MCV: 87.7 fL (ref 80.0–100.0)
Platelets: 219 10*3/uL (ref 150–400)
RBC: 4.73 MIL/uL (ref 3.87–5.11)
RDW: 13.5 % (ref 11.5–15.5)
WBC: 3.9 10*3/uL — ABNORMAL LOW (ref 4.0–10.5)
nRBC: 0 % (ref 0.0–0.2)

## 2021-07-02 LAB — URINALYSIS, ROUTINE W REFLEX MICROSCOPIC
Bilirubin Urine: NEGATIVE
Glucose, UA: NEGATIVE mg/dL
Hgb urine dipstick: NEGATIVE
Ketones, ur: NEGATIVE mg/dL
Leukocytes,Ua: NEGATIVE
Nitrite: NEGATIVE
Protein, ur: NEGATIVE mg/dL
Specific Gravity, Urine: 1.009 (ref 1.005–1.030)
pH: 6.5 (ref 5.0–8.0)

## 2021-07-02 LAB — LIPASE, BLOOD: Lipase: 25 U/L (ref 11–51)

## 2021-07-02 LAB — TROPONIN I (HIGH SENSITIVITY): Troponin I (High Sensitivity): <2 ng/L (ref <18)

## 2021-07-02 MED ORDER — ACETAMINOPHEN 500 MG PO TABS
1000.0000 mg | ORAL_TABLET | Freq: Once | ORAL | Status: AC
  Administered 2021-07-02: 1000 mg via ORAL
  Filled 2021-07-02: qty 2

## 2021-07-02 MED ORDER — POTASSIUM CHLORIDE CRYS ER 20 MEQ PO TBCR
40.0000 meq | EXTENDED_RELEASE_TABLET | Freq: Once | ORAL | Status: AC
Start: 2021-07-02 — End: 2021-07-02
  Administered 2021-07-02: 40 meq via ORAL
  Filled 2021-07-02: qty 2

## 2021-07-02 MED ORDER — POTASSIUM CHLORIDE CRYS ER 20 MEQ PO TBCR: 20.0000 meq | EXTENDED_RELEASE_TABLET | Freq: Every day | ORAL | 0 refills | Status: DC

## 2021-07-02 MED ORDER — FAMOTIDINE 20 MG PO TABS
20.0000 mg | ORAL_TABLET | Freq: Once | ORAL | Status: AC
Start: 2021-07-02 — End: 2021-07-02
  Administered 2021-07-02: 20 mg via ORAL
  Filled 2021-07-02: qty 1

## 2021-07-02 MED ORDER — ALUM & MAG HYDROXIDE-SIMETH 200-200-20 MG/5ML PO SUSP
30.0000 mL | Freq: Once | ORAL | Status: AC
  Administered 2021-07-02: 30 mL via ORAL
  Filled 2021-07-02: qty 30

## 2021-07-02 NOTE — ED Notes (Signed)
?  Asked patient if she could obtain a urine specimen and she stated she did not think she could get one at this time.  Patient instructed to hit call light and let use know when she could get a specimen.  ?

## 2021-07-02 NOTE — Telephone Encounter (Signed)
?  Chief Complaint: low potassium level- patient states she forgot to take her potassium supplement and is now having symptoms ?Symptoms: body aches, cramps in feet, hot flashes ?Frequency: 2-3 weeks ?Pertinent Negatives: Patient denies rash, cold or flu symptoms, weight loss ?Disposition: [] ED /[] Urgent Care (no appt availability in office) / [x] Appointment(In office/virtual)/ []  [facility]/ [] Home Care/ [] Refused Recommended Disposition /[] [place] [facility]/ []  Follow-up with PCP ?Additional Notes: Patient states she will not go to ED- patient has started her potasium, she is advised go to ED for chest pain-irregular heart rate. Appointment scheduled for tomorrow.  ?

## 2021-07-02 NOTE — Telephone Encounter (Signed)
Summary: feeling bad-may have low K  ? Pt was told by Rene Kocher that her K was low. And she was prescribed potassium chloride (KLOR-CON M) 10 MEQ tablet, but she never took them.  Now she is feeling really bad, her body hurts all over, having cramps, hot flashes, just all kinds of stuff.  Pt wants to know what she needs to do- does she need IV K? Appt? Please advise.   ?  ? ?Reason for Disposition ?? [1] MODERATE pain (e.g., interferes with normal activities) AND [2] present > 3 days ? ?Answer Assessment - Initial Assessment Questions ?1. ONSET: "When did the muscle aches or body pains start?"  ?    2-3 weeks- patient has concluded her potassium is low- she forgot about her prescription ?2. LOCATION: "What part of your body is hurting?" (e.g., entire body, arms, legs)  ?    Back- chest- soreness, cramping in feet, hot flashes ?3. SEVERITY: "How bad is the pain?" (Scale 1-10; or mild, moderate, severe) ?  - MILD (1-3): doesn't interfere with normal activities  ?  - MODERATE (4-7): interferes with normal activities or awakens from sleep  ?  - SEVERE (8-10):  excruciating pain, unable to do any normal activities  ?    moderate ?4. CAUSE: "What do you think is causing the pains?" ?    Low potassium ?5. FEVER: "Have you been having fever?" ?    no ?6. OTHER SYMPTOMS: "Do you have any other symptoms?" (e.g., chest pain, weakness, rash, cold or flu symptoms, weight loss) ?    Chest/back soreness- patient reports- "not pain" ?7. PREGNANCY: "Is there any chance you are pregnant?" "When was your last menstrual period?" ?    na ?8. TRAVEL: "Have you traveled out of the country in the last month?" (e.g., travel history, exposures) ?    no ? ?Protocols used: Muscle Aches and Body Pain-A-AH ? ?

## 2021-07-02 NOTE — ED Triage Notes (Signed)
Patient here POV from Home with Weakness. ? ?Patient endorses Generalized Weakness and Cramping as well as Bilateral CP. Began 2-3 Weeks ago and has been worsening since.  ? ?States she was diagnosed with Hypokalemia approximately 2 months ago and was prescribed Potassium Supplements. States she originally took Supplements for 2 weeks and then forgot to continue.  ? ?NAD Noted during Triage. A&Ox4. GCS 15. Ambulatory.  ?

## 2021-07-02 NOTE — Discharge Instructions (Addendum)
It was our pleasure to provide your ER care today - we hope that you feel better. ? ?Drink plenty of fluids/stay well hydrated. Take acetaminophen or ibuprofen as need.  ? ?If reflux symptoms, try pepcid and maalox as need for symptom relief. ? ?For chest pain, follow up with cardiologist in the next 1-2 weeks - call office to arrange appointment.  ? ?Also follow up closely with primary care doctor - have blood pressure rechecked then, as it is high tonight. Also, your potassium is mildly low - eat plenty of fruits and vegetables, take potassium supplement as prescribed, and follow up with primary care doctor in one week.  ? ?Return to ER if worse, new symptoms, fevers, recurrent/persistent chest pain, increased trouble breathing, or other concern.  ?

## 2021-07-02 NOTE — ED Provider Notes (Signed)
?[facility] ?Provider Note ? ? ?CSN: [id number] ?Arrival date & time: 07/02/21  1924 ? ?  ? ?History ? ?Chief Complaint  ?Patient presents with  ? Weakness  ? ? ?Diana [name] is a 61 y.o. female. ? ?Patient c/o pain to lower chest/upper abd area in past few weeks. Also questions whether potassium low as history of same. Pts symptoms at  rest, constant, dull, without consistent exacerbating or alleviating factors. No exertional cp or discomfort. No pleuritic pain. Not related to eating. No abd distension. Having normal bms. No vomiting. No hx pud, pancreatitis or gallstones. +hx gerd. No hx cad or fam hx premature cad. No leg pain or swelling. No hx dvt or pe. No associated nv, diaphoresis or sob. No acute change today.  ? ?The history is provided by the patient and medical records.  ?Weakness ?Associated symptoms: abdominal pain and chest pain   ?Associated symptoms: no diarrhea, no dysuria, no fever, no headaches, no shortness of breath and no vomiting   ? ?  ? ?Home Medications ?Prior to Admission medications   ?Medication Sig Start Date End Date Taking? Authorizing Provider  ?Ascorbic Acid 125 MG CHEW Chew 1 each by mouth daily.    [provider]  ?cetirizine (ZYRTEC) 10 MG tablet Take 10 mg by mouth daily as needed.    [provider]  ?Cholecalciferol (VITAMIN D3) 25 MCG (1000 UT) CAPS Take 5,000 Units by mouth.     [provider]  ?clonazePAM (KLONOPIN) 0.5 MG tablet Take 1 tablet (0.5 mg total) by mouth daily as needed. for anxiety 04/22/21   Jearld Fenton, NP  ?hydrochlorothiazide (MICROZIDE) 12.5 MG capsule Take 12.5 mg by mouth daily. 03/25/21   [provider]  ?hydrocortisone (ANUSOL-HC) 2.5 % rectal cream PLACE 1 APPLICATION RECTALLY 2 TIMES DAILY. 05/27/21   Jearld Fenton, NP  ?ibuprofen (ADVIL) 200 MG tablet Take 400-600 mg by mouth every 6 (six) hours as needed.    [provider]  ?omeprazole (PRILOSEC) 40 MG capsule Take 1  capsule (40 mg total) by mouth 2 (two) times daily. 04/22/21   Jearld Fenton, NP  ?potassium chloride (KLOR-CON M) 10 MEQ tablet Take 1 tablet (10 mEq total) by mouth daily. 05/01/21   Jearld Fenton, NP  ?triamcinolone cream (KENALOG) 0.1 % Apply 1 application topically 2 (two) times daily. 11/12/20   Jearld Fenton, NP  ?   ? ?Allergies    ?Patient has no known allergies.   ? ?Review of Systems   ?Review of Systems  ?Constitutional:  Negative for chills and fever.  ?HENT:  Negative for sore throat.   ?Eyes:  Negative for redness.  ?Respiratory:  Negative for shortness of breath.   ?Cardiovascular:  Positive for chest pain. Negative for leg swelling.  ?Gastrointestinal:  Positive for abdominal pain. Negative for diarrhea and vomiting.  ?Genitourinary:  Negative for dysuria, flank pain and hematuria.  ?Musculoskeletal:  Negative for neck pain.  ?Skin:  Negative for rash.  ?Neurological:  Positive for weakness. Negative for headaches.  ?Hematological:  Does not bruise/bleed easily.  ?Psychiatric/Behavioral:  Negative for confusion.   ? ?Physical Exam ?Updated Vital Signs ?BP (!) 151/87 (BP Location: Right Arm)   Pulse 78   Temp 98.3 ?F (36.8 ?C) (Oral)   Resp 20   Ht 1.702 m (5\' 7" )   Wt 95.7 kg   LMP 01/20/2016   SpO2 99%   BMI 33.04 kg/m?  ?Physical Exam ?Vitals and  nursing note reviewed.  ?Constitutional:   ?   Appearance: Normal appearance. She is well-developed.  ?HENT:  ?   Head: Atraumatic.  ?   Nose: Nose normal.  ?   Mouth/Throat:  ?   Mouth: Mucous membranes are moist.  ?Eyes:  ?   General: No scleral icterus. ?   Conjunctiva/sclera: Conjunctivae normal.  ?Neck:  ?   Trachea: No tracheal deviation.  ?Cardiovascular:  ?   Rate and Rhythm: Normal rate and regular rhythm.  ?   Pulses: Normal pulses.  ?   Heart sounds: Normal heart sounds. No murmur heard. ?  No friction rub. No gallop.  ?Pulmonary:  ?   Effort: Pulmonary effort is normal. No respiratory distress.  ?   Breath sounds: Normal breath  sounds.  ?Chest:  ?   Chest wall: No tenderness.  ?Abdominal:  ?   General: Bowel sounds are normal. There is no distension.  ?   Palpations: Abdomen is soft.  ?   Tenderness: There is no abdominal tenderness. There is no guarding.  ?Genitourinary: ?   Comments: No cva tenderness.  ?Musculoskeletal:     ?   General: No swelling or tenderness.  ?   Cervical back: Normal range of motion and neck supple. No rigidity. No muscular tenderness.  ?   Right lower leg: No edema.  ?   Left lower leg: No edema.  ?   Comments: T/L spine non tender, aligned.   ?Skin: ?   General: Skin is warm and dry.  ?   Findings: No rash.  ?Neurological:  ?   Mental Status: She is alert.  ?   Comments: Alert, speech normal. Motor/sens grossly intact bil. Steady gait.   ?Psychiatric:     ?   Mood and Affect: Mood normal.  ? ? ?ED Results / Procedures / Treatments   ?Labs ?(all labs ordered are listed, but only abnormal results are displayed) ?Results for orders placed or performed during the hospital encounter of 07/02/21  ?CBC  ?Result Value Ref Range  ? WBC 3.9 (L) 4.0 - 10.5 K/uL  ? RBC 4.73 3.87 - 5.11 MIL/uL  ? Hemoglobin 14.1 12.0 - 15.0 g/dL  ? HCT 41.5 36.0 - 46.0 %  ? MCV 87.7 80.0 - 100.0 fL  ? MCH 29.8 26.0 - 34.0 pg  ? MCHC 34.0 30.0 - 36.0 g/dL  ? RDW 13.5 11.5 - 15.5 %  ? Platelets 219 150 - 400 K/uL  ? nRBC 0.0 0.0 - 0.2 %  ?Urinalysis, Routine w reflex microscopic Urine, Clean Catch  ?Result Value Ref Range  ? Color, Urine COLORLESS (A) YELLOW  ? APPearance CLEAR CLEAR  ? Specific Gravity, Urine 1.009 1.005 - 1.030  ? pH 6.5 5.0 - 8.0  ? Glucose, UA NEGATIVE NEGATIVE mg/dL  ? Hgb urine dipstick NEGATIVE NEGATIVE  ? Bilirubin Urine NEGATIVE NEGATIVE  ? Ketones, ur NEGATIVE NEGATIVE mg/dL  ? Protein, ur NEGATIVE NEGATIVE mg/dL  ? Nitrite NEGATIVE NEGATIVE  ? Leukocytes,Ua NEGATIVE NEGATIVE  ? RBC / HPF 0-5 0 - 5 RBC/hpf  ? WBC, UA 0-5 0 - 5 WBC/hpf  ? Bacteria, UA RARE (A) NONE SEEN  ?Comprehensive metabolic panel  ?Result Value  Ref Range  ? Sodium 141 135 - 145 mmol/L  ? Potassium 3.2 (L) 3.5 - 5.1 mmol/L  ? Chloride 100 98 - 111 mmol/L  ? CO2 30 22 - 32 mmol/L  ? Glucose, Bld 109 (H) 70 - 99 mg/dL  ?  BUN 14 6 - 20 mg/dL  ? Creatinine, Ser 0.64 0.44 - 1.00 mg/dL  ? Calcium 9.3 8.9 - 10.3 mg/dL  ? Total Protein 7.0 6.5 - 8.1 g/dL  ? Albumin 4.2 3.5 - 5.0 g/dL  ? AST 18 15 - 41 U/L  ? ALT 13 0 - 44 U/L  ? Alkaline Phosphatase 72 38 - 126 U/L  ? Total Bilirubin 1.3 (H) 0.3 - 1.2 mg/dL  ? GFR, Estimated >60 >60 mL/min  ? Anion gap 11 5 - 15  ?Lipase, blood  ?Result Value Ref Range  ? Lipase 25 11 - 51 U/L  ?Troponin I (High Sensitivity)  ?Result Value Ref Range  ? Troponin I (High Sensitivity) <2 <18 ng/L  ? ? ? ?EKG ?EKG Interpretation ? ?Date/Time:  Wednesday July 02 2021 19:33:50 EDT ?Ventricular Rate:  72 ?PR Interval:  163 ?QRS Duration: 80 ?QT Interval:  387 ?QTC Calculation: 424 ?R Axis:   26 ?Text Interpretation: Sinus rhythm Nonspecific ST abnormality No significant change since last tracing Confirmed by Lajean Saver [phone]) on 07/02/2021 7:37:08 PM ? ?Radiology ?DG Chest Port 1 View ? ?Result Date: 07/02/2021 ?CLINICAL DATA:  Generalized weakness and bilateral chest pain EXAM: PORTABLE CHEST 1 VIEW COMPARISON:  Chest radiograph dated April 24, 2021 FINDINGS: The heart size and mediastinal contours are within normal limits. Both lungs are clear. The visualized skeletal structures are unremarkable. IMPRESSION: No active disease. Electronically Signed   By: Keane Police D.O.   On: 07/02/2021 20:22   ? ?Procedures ?Procedures  ? ? ?Medications Ordered in ED ?Medications - No data to display ? ?ED Course/ Medical Decision Making/ A&P ?  ?                        ?Medical Decision Making ?Problems Addressed: ?Essential hypertension: acute illness or injury ?General weakness: acute illness or injury ?Precordial chest pain: acute illness or injury that poses a threat to life or bodily functions ?Upper abdominal pain: acute illness or  injury ? ?Amount and/or Complexity of Data Reviewed ?External Data Reviewed: notes. ?Labs: ordered. Decision-making details documented in ED Course. ?Radiology: ordered and independent interpretation performed. Decision-

## 2021-07-03 ENCOUNTER — Encounter: Payer: Self-pay | Admitting: Internal Medicine

## 2021-07-03 ENCOUNTER — Ambulatory Visit
Admission: RE | Admit: 2021-07-03 | Discharge: 2021-07-03 | Disposition: A | Discharge status: Home / Self Care | Payer: BC Managed Care – PPO | Source: Ambulatory Visit | Attending: Internal Medicine | Admitting: Internal Medicine

## 2021-07-03 ENCOUNTER — Other Ambulatory Visit: Payer: Self-pay | Admitting: Internal Medicine

## 2021-07-03 ENCOUNTER — Ambulatory Visit
Admission: RE | Admit: 2021-07-03 | Discharge: 2021-07-03 | Disposition: A | Discharge status: Home / Self Care | Payer: BC Managed Care – PPO | Attending: Internal Medicine | Admitting: Internal Medicine

## 2021-07-03 ENCOUNTER — Ambulatory Visit: Payer: BC Managed Care – PPO | Admitting: Internal Medicine

## 2021-07-03 VITALS — BP 144/88 | HR 84 | Temp 97.3°F | Wt 212.0 lb

## 2021-07-03 DIAGNOSIS — R5382 Chronic fatigue, unspecified: Secondary | ICD-10-CM | POA: Diagnosis not present

## 2021-07-03 DIAGNOSIS — R0683 Snoring: Secondary | ICD-10-CM

## 2021-07-03 DIAGNOSIS — M2550 Pain in unspecified joint: Secondary | ICD-10-CM

## 2021-07-03 DIAGNOSIS — R531 Weakness: Secondary | ICD-10-CM

## 2021-07-03 DIAGNOSIS — E876 Hypokalemia: Secondary | ICD-10-CM | POA: Diagnosis not present

## 2021-07-03 DIAGNOSIS — M791 Myalgia, unspecified site: Secondary | ICD-10-CM

## 2021-07-03 DIAGNOSIS — M546 Pain in thoracic spine: Secondary | ICD-10-CM | POA: Diagnosis not present

## 2021-07-03 NOTE — Progress Notes (Signed)
? ?Subjective:  ? ? Patient ID: Diana [name], female    DOB: 06/27/1960, 61 y.o.   MRN: [id number] ? ?HPI ? ?Pt presents to the clinic today for ER follow-up.  She presented to the ER 3/15 with complaint of back pain abdominal pain, nausea.  She reports this started a few weeks ago.  She describes the upper back pain is tight and tense.  The pain radiates around to her upper abdomen.  She reports some associated nausea but denies vomiting, diarrhea constipation or blood in her stool.  Her chest x-ray was normal.  ECG did not show any acute findings.  Labs revealed hypokalemia, however she admits to not taking her potassium supplement as prescribed.  Labs were otherwise unremarkable.  She was discharged without intervention advised to follow-up with her PCP.  Since discharge, she reports persistent bilateral upper back pain.  She also reports intermittent nausea and fatigue.  She reports she does not know why she is feeling like this.  Everyone keeps telling her there is nothing wrong but she knows something is wrong. ? ?Review of Systems ? ?   ?Past Medical History:  ?Diagnosis Date  ? Allergy   ? Ear infection   ? recurrent  ? ? ?Current Outpatient Medications  ?Medication Sig Dispense Refill  ? Ascorbic Acid 125 MG CHEW Chew 1 each by mouth daily.    ? cetirizine (ZYRTEC) 10 MG tablet Take 10 mg by mouth daily as needed.    ? Cholecalciferol (VITAMIN D3) 25 MCG (1000 UT) CAPS Take 5,000 Units by mouth.     ? clonazePAM (KLONOPIN) 0.5 MG tablet Take 1 tablet (0.5 mg total) by mouth daily as needed. for anxiety 30 tablet 0  ? hydrochlorothiazide (MICROZIDE) 12.5 MG capsule Take 12.5 mg by mouth daily.    ? hydrocortisone (ANUSOL-HC) 2.5 % rectal cream PLACE 1 APPLICATION RECTALLY 2 TIMES DAILY. 30 g 0  ? ibuprofen (ADVIL) 200 MG tablet Take 400-600 mg by mouth every 6 (six) hours as needed.    ? omeprazole (PRILOSEC) 40 MG capsule Take 1 capsule (40 mg total) by mouth 2 (two) times daily. 180 capsule 0  ? potassium  chloride SA (KLOR-CON M) 20 MEQ tablet Take 1 tablet (20 mEq total) by mouth daily. 15 tablet 0  ? triamcinolone cream (KENALOG) 0.1 % Apply 1 application topically 2 (two) times daily. 30 g 0  ? ?No current facility-administered medications for this visit.  ? ? ?No Known Allergies ? ?Family History  ?Problem Relation Age of Onset  ? Hypertension Mother   ?     alive  ? Arthritis Mother   ? Lung cancer Father   ?     deceased  ? Heart disease Father   ? Other Other   ?     1 brother and 1 sister both alive an healthy  ? Hypertension Maternal Grandmother   ? ? ?Social History  ? ?Socioeconomic History  ? Marital status: Widowed  ?  Spouse name: Not on file  ? Number of children: 3  ? Years of education: Not on file  ? Highest education level: Not on file  ?Occupational History  ? Occupation: customer service  ?  Employer: [employer]  ?Tobacco Use  ? Smoking status: Former  ?  Types: Cigarettes  ?  Quit date: 04/20/1985  ?  Years since quitting: 36.2  ? Smokeless tobacco: Never  ? Tobacco comments:  ?  remote tobacco use   ?  Vaping Use  ? Vaping Use: Never used  ?Substance and Sexual Activity  ? Alcohol use: No  ?  Alcohol/week: 0.0 standard drinks  ? Drug use: No  ? Sexual activity: Not Currently  ?Other Topics Concern  ? Not on file  ?Social History Narrative  ? Not on file  ? ?Social Determinants of Health  ? ?Financial Resource Strain: Not on file  ?Food Insecurity: Not on file  ?Transportation Needs: Not on file  ?Physical Activity: Not on file  ?Stress: Not on file  ?Social Connections: Not on file  ?Intimate Partner Violence: Not on file  ? ? ? ?Constitutional: Patient reports fatigue.  Denies fever, malaise, headache or abrupt weight changes.  ?HEENT: Denies eye pain, eye redness, ear pain, ringing in the ears, wax buildup, runny nose, nasal congestion, bloody nose, or sore throat. ?Respiratory: Denies difficulty breathing, shortness of breath, cough or sputum production.   ?Cardiovascular: Denies chest pain,  chest tightness, palpitations or swelling in the hands or feet.  ?Gastrointestinal: Pt reports nausea. Denies abdominal pain, bloating, constipation, diarrhea or blood in the stool.  ?GU: Denies urgency, frequency, pain with urination, burning sensation, blood in urine, odor or discharge. ?Musculoskeletal: Patient reports thoracic back pain.  Denies decrease in range of motion, difficulty with gait, or joint swelling.  ?Skin: Denies redness, rashes, lesions or ulcercations.  ?Neurological: Denies dizziness, difficulty with memory, difficulty with speech or problems with balance and coordination.  ? ? ?No other specific complaints in a complete review of systems (except as listed in HPI above). ? ?Objective:  ? Physical Exam ? ? ?BP (!) 144/88 (BP Location: Left Arm, Patient Position: Sitting, Cuff Size: Large)   Pulse 84   Temp (!) 97.3 ?F (36.3 ?C) (Temporal)   Wt 212 lb (96.2 kg)   LMP 01/20/2016   SpO2 99%   BMI 33.20 kg/m?  ? ?Wt Readings from Last 3 Encounters:  ?07/02/21 210 lb 15.7 oz (95.7 kg)  ?05/14/21 211 lb (95.7 kg)  ?04/29/21 209 lb 3.2 oz (94.9 kg)  ? ? ?General: Appears her stated age, obese, in NAD. ?Skin: Warm, dry and intact. No rashes noted. ?HEENT: Head: normal shape and size; Eyes: EOMs intact; ?Cardiovascular: Normal rate and rhythm. S1,S2 noted.  No murmur, rubs or gallops noted. ?Pulmonary/Chest: Normal effort and positive vesicular breath sounds. No respiratory distress. No wheezes, rales or ronchi noted.  ?Abdomen: Soft and nontender. Normal bowel sounds.  ?Musculoskeletal: Normal flexion, extension, rotation and lateral bending of the spine.  No bony tenderness noted over the spine.  No difficulty with gait.  ?Neurological: Alert and oriented.  ? ?BMET ?   ?Component Value Date/Time  ? NA 141 07/02/2021 2000  ? K 3.2 (L) 07/02/2021 2000  ? CL 100 07/02/2021 2000  ? CO2 30 07/02/2021 2000  ? GLUCOSE 109 (H) 07/02/2021 2000  ? BUN 14 07/02/2021 2000  ? CREATININE 0.64 07/02/2021 2000   ? CREATININE 0.66 05/14/2021 1030  ? CALCIUM 9.3 07/02/2021 2000  ? GFRNONAA >60 07/02/2021 2000  ? GFRAA >60 07/27/2017 2153  ? ? ?Lipid Panel  ?   ?Component Value Date/Time  ? CHOL 171 02/18/2021 1016  ? TRIG 68 02/18/2021 1016  ? HDL 27 (L) 02/18/2021 1016  ? CHOLHDL 6.3 (H) 02/18/2021 1016  ? VLDL 21.0 02/09/2019 1245  ? LDLCALC 128 (H) 02/18/2021 1016  ? ? ?CBC ?   ?Component Value Date/Time  ? WBC 3.9 (L) 07/02/2021 2000  ? RBC 4.73 07/02/2021 2000  ?  HGB 14.1 07/02/2021 2000  ? HCT 41.5 07/02/2021 2000  ? PLT 219 07/02/2021 2000  ? MCV 87.7 07/02/2021 2000  ? MCH 29.8 07/02/2021 2000  ? MCHC 34.0 07/02/2021 2000  ? RDW 13.5 07/02/2021 2000  ? LYMPHSABS 1.1 07/28/2016 1232  ? MONOABS 0.4 07/28/2016 1232  ? EOSABS 0.0 07/28/2016 1232  ? BASOSABS 0.0 07/28/2016 1232  ? ? ?Hgb A1C ?Lab Results  ?Component Value Date  ? HGBA1C 5.9 (H) 02/18/2021  ? ? ? ? ? ? ?   ?Assessment & Plan:  ? ?ER follow-up for Fatigue, Weakness, Thoracic Back Pain, Hypokalemia: ? ?ER notes, labs and imaging reviewed ?Discussed possibility of sleep apnea versus fibromyalgia ?We will obtain x-ray of her thoracic spine today ?She will restart her potassium supplement ?Encourage regular stretching and core strengthening ? ?We will follow-up after x-ray with further recommendation and treatment plan ? ?[name], NP ?This visit occurred during the SARS-CoV-2 public health emergency.  Safety protocols were in place, including screening questions prior to the visit, additional usage of staff PPE, and extensive cleaning of exam room while observing appropriate contact time as indicated for disinfecting solutions.  ? ?

## 2021-07-04 ENCOUNTER — Encounter: Payer: Self-pay | Admitting: Internal Medicine

## 2021-07-04 MED ORDER — ONDANSETRON 4 MG PO TBDP: 4.0000 mg | ORAL_TABLET | Freq: Three times a day (TID) | ORAL | 0 refills | Status: DC | PRN

## 2021-07-04 MED ORDER — CLONAZEPAM 0.5 MG PO TABS: 0.5000 mg | ORAL_TABLET | Freq: Every day | ORAL | 0 refills | Status: DC | PRN

## 2021-07-04 NOTE — Patient Instructions (Signed)

## 2021-07-17 DIAGNOSIS — L820 Inflamed seborrheic keratosis: Secondary | ICD-10-CM | POA: Diagnosis not present

## 2021-07-28 ENCOUNTER — Other Ambulatory Visit: Payer: Self-pay

## 2021-07-28 ENCOUNTER — Ambulatory Visit (INDEPENDENT_AMBULATORY_CARE_PROVIDER_SITE_OTHER): Payer: BC Managed Care – PPO | Admitting: Gastroenterology

## 2021-07-28 ENCOUNTER — Encounter: Payer: Self-pay | Admitting: Gastroenterology

## 2021-07-28 VITALS — BP 131/83 | HR 73 | Temp 98.6°F | Ht 67.0 in | Wt 220.1 lb

## 2021-07-28 DIAGNOSIS — B181 Chronic viral hepatitis B without delta-agent: Secondary | ICD-10-CM | POA: Diagnosis not present

## 2021-07-28 DIAGNOSIS — K760 Fatty (change of) liver, not elsewhere classified: Secondary | ICD-10-CM | POA: Diagnosis not present

## 2021-07-28 NOTE — Progress Notes (Signed)
?  ?[name], MD ?[address]  ?[address]  ?[place], [place] [address]  ?Main: [phone]  ?Fax: [phone] ? ? ? ?Gastroenterology Consultation ? ?Referring Provider:     Jearld Fenton, NP ?Primary Care Physician:  Jearld Fenton, NP ?Primary Gastroenterologist:  Dr. [name] ?Reason for Consultation:  Chronic hepatitis B infection, fatty liver, colon cancer screening, flareup of GERD ?      ? HPI:   ?Diana [name] is a 61 y.o. female referred by Dr. Garnette Gunner, Coralie Keens, NP  for consultation & management of chronic hepatitis B infection.Ms. Conneely has history of hepatitis B infection, treated with entecavir with no detection of active infection for last 2 years, E antigen negative, E antibody positive.   And, viral load is undetectable most recently in 11/2019. She does have fatty liver. She denies any GI symptoms today. ?LFTs are normal except for indirect hyperbilirubinemia. ? ?Patient did not show for her screening colonoscopy last year.  She reports intermittent constipation.  She does have flareup of hemorrhoids, mostly swelling and discomfort.  She has been gaining weight ? ?Follow-up visit 07/28/21 ?Patient made a follow-up appointment when she had flareup of GERD symptoms about 3 months ago.  Her PCP increased omeprazole 40 mg to twice daily and apparently patient reports that it did not help.  Currently, her GERD symptoms have subsided And she is on omeprazole 40 mg once a day.  She continues to gain weight, does admit to drinking sweet tea daily.  Patient did not undergo ultrasound liver as she was not able to afford co-pay.  She did not undergo colonoscopy as well ? ?She is also overdue for colon cancer screening ? ?NSAIDs: she is no longer taking NSAIDs ? ?Antiplts/Anticoagulants/Anti thrombotics: none ? ?GI Procedures: none ?She denies family history of colon cancer ? ?Ultrasound-guided liver biopsy 07/27/2017 ?DIAGNOSIS:  ?A. LIVER; ULTRASOUND-GUIDED CORE BIOPSY:  ?- HEPATITIC  PROCESS WITH MILD TO MODERATE PORTAL AND LOBULAR  ?INFLAMMATION, CONSISTENT WITH VIRAL HEPATITIS B, SEE COMMENT.  ?- NEGATIVE FOR FIBROSIS.  ?- NEGATIVE FOR STEATOSIS.  ? ?Past Medical History:  ?Diagnosis Date  ? Allergy   ? Ear infection   ? recurrent  ? ? ?Past Surgical History:  ?Procedure Laterality Date  ? ABDOMINAL HYSTERECTOMY  2019  ? partial  ? TONSILLECTOMY    ? TUBAL LIGATION    ? bilateral  ? ? ? ?Current Outpatient Medications:  ?  Ascorbic Acid 125 MG CHEW, Chew 1 each by mouth daily., Disp: , Rfl:  ?  cetirizine (ZYRTEC) 10 MG tablet, Take 10 mg by mouth daily as needed., Disp: , Rfl:  ?  Cholecalciferol (VITAMIN D3) 25 MCG (1000 UT) CAPS, Take 5,000 Units by mouth. , Disp: , Rfl:  ?  clonazePAM (KLONOPIN) 0.5 MG tablet, Take 1 tablet (0.5 mg total) by mouth daily as needed. for anxiety, Disp: 30 tablet, Rfl: 0 ?  hydrochlorothiazide (MICROZIDE) 12.5 MG capsule, Take 12.5 mg by mouth daily., Disp: , Rfl:  ?  hydrocortisone (ANUSOL-HC) 2.5 % rectal cream, PLACE 1 APPLICATION RECTALLY 2 TIMES DAILY., Disp: 30 g, Rfl: 0 ?  ibuprofen (ADVIL) 200 MG tablet, Take 400-600 mg by mouth every 6 (six) hours as needed., Disp: , Rfl:  ?  omeprazole (PRILOSEC) 40 MG capsule, Take 1 capsule (40 mg total) by mouth 2 (two) times daily., Disp: 180 capsule, Rfl: 0 ?  ondansetron (ZOFRAN-ODT) 4 MG disintegrating tablet, Take 1 tablet (4 mg total) by  mouth every 8 (eight) hours as needed for nausea or vomiting., Disp: 30 tablet, Rfl: 0 ?  potassium chloride (KLOR-CON) 10 MEQ tablet, Take 10 mEq by mouth daily., Disp: , Rfl:  ?  triamcinolone cream (KENALOG) 0.1 %, Apply 1 application topically 2 (two) times daily., Disp: 30 g, Rfl: 0 ? ? ?Family History  ?Problem Relation Age of Onset  ? Hypertension Mother   ?     alive  ? Arthritis Mother   ? Lung cancer Father   ?     deceased  ? Heart disease Father   ? Other Other   ?     1 brother and 1 sister both alive an healthy  ? Hypertension Maternal Grandmother   ?   ? ?Social History  ? ?Tobacco Use  ? Smoking status: Former  ?  Types: Cigarettes  ?  Quit date: 04/20/1985  ?  Years since quitting: 36.2  ? Smokeless tobacco: Never  ? Tobacco comments:  ?  remote tobacco use   ?Vaping Use  ? Vaping Use: Never used  ?Substance Use Topics  ? Alcohol use: No  ?  Alcohol/week: 0.0 standard drinks  ? Drug use: No  ? ? ?Allergies as of 07/28/2021  ? (No Known Allergies)  ? ? ?Review of Systems:    ?All systems reviewed and negative except where noted in HPI. ? ? Physical Exam:  ?BP 131/83 (BP Location: Left Arm, Patient Position: Sitting, Cuff Size: Large)   Pulse 73   Temp 98.6 ?F (37 ?C) (Oral)   Ht 5\' 7"  (1.702 m)   Wt 220 lb 2 oz (99.8 kg)   LMP 01/20/2016   BMI 34.48 kg/m?  ?Patient's last menstrual period was 01/20/2016. ? ?General:   Alert,  Well-developed, well-nourished, pleasant and cooperative in NAD ?Head:  Normocephalic and atraumatic. ?Eyes:  Sclera clear, no icterus.   Conjunctiva pink. ?Ears:  Normal auditory acuity. ?Nose:  No deformity, discharge, or lesions. ?Mouth:  No deformity or lesions,oropharynx pink & moist. ?Neck:  Supple; no masses or thyromegaly. ?Lungs:  Respirations even and unlabored.  Clear throughout to auscultation.   No wheezes, crackles, or rhonchi. No acute distress. ?Heart:  Regular rate and rhythm; no murmurs, clicks, rubs, or gallops. ?Abdomen:  Normal bowel sounds. Soft, obese, non-tender and non-distended without masses, hepatosplenomegaly or hernias noted.  No guarding or rebound tenderness.   ?Rectal: Not performed ?Msk:  Symmetrical without gross deformities. Good, equal movement & strength bilaterally. ?Pulses:  Normal pulses noted. ?Extremities:  No clubbing or edema.  No cyanosis. ?Neurologic:  Alert and oriented x3;  grossly normal neurologically. ?Skin:  Intact without significant lesions or rashes. No jaundice. ?Psych:  Alert and cooperative. Normal mood and affect. ? ?Imaging Studies: ?Last ultrasound abdomen in 05/2015 which was  unremarkable ? ?Ultrasound abdomen 06/2017 unremarkable with no evidence of cirrhosis and no portal hypertension ? ?Assessment and Plan:  ? ?Diana [name] is a 61 y.o. female with obesity, hypertension, GERD is here for follow-up of chronic hepatitis B infection. Her hepatitis D antigen is negative. She does not have clinical signs or symptoms to suggest chronic liver disease. There is no evidence of portal hypertension. Secondary liver disease workup revealed weakly positive anti-smooth muscle antibodies. She does not have hep C. Status post liver biopsy.  Patient did not tolerate viread, developed stomach upset and myalgias.  Therefore, treated with entecavir which was stopped in 07/2019. ? ?Chronic hepatitis B infection, currently seroconverted, E antigen negative, E  antibody positive and HBV DNA undetected as of 11/2019 ?- Okay to hold off on antiviral therapy at this time ?- Recheck LFTs, HBV DNA today every 6 months ?- Hepatitis B e antigen, hepatitis B e antibody today then every 6 months ?- HBsAg annually ?- to avoid alcohol use, limit intake of NSAIDs and Tylenol use for arthralgias ?- Check [facility] ? ?Colon cancer screening: overdue ?Average risk, discussed with patient other screening modalities and advised her to discuss about these with her PCP if she is not willing to undergo colonoscopy ? ? ?Follow up every 12 months ? ? ?[name], MD ?

## 2021-07-30 LAB — HEPATITIS B DNA, ULTRAQUANTITATIVE, PCR: HBV DNA SERPL PCR-ACNC: NOT DETECTED IU/mL

## 2021-07-30 LAB — NASH FIBROSURE
ALPHA 2-MACROGLOBULINS, QN: 336 mg/dL — ABNORMAL HIGH (ref 110–276)
ALT (SGPT) P5P: 13 IU/L (ref 0–40)
AST (SGOT) P5P: 15 IU/L (ref 0–40)
Apolipoprotein A-1: 74 mg/dL — ABNORMAL LOW (ref 116–209)
Bilirubin, Total: 0.7 mg/dL (ref 0.0–1.2)
Cholesterol, Total: 189 mg/dL (ref 100–199)
Fibrosis Score: 0.59 — ABNORMAL HIGH (ref 0.00–0.21)
GGT: 13 IU/L (ref 0–60)
Glucose: 102 mg/dL — ABNORMAL HIGH (ref 70–99)
Haptoglobin: 151 mg/dL (ref 33–346)
Height: 67 in
NASH Score: 0.5 — ABNORMAL HIGH
Steatosis Score: 0.52 — ABNORMAL HIGH (ref 0.00–0.30)
Triglycerides: 120 mg/dL (ref 0–149)
Weight: 220 [lb_av]

## 2021-08-06 ENCOUNTER — Ambulatory Visit: Payer: Self-pay | Admitting: *Deleted

## 2021-08-06 ENCOUNTER — Other Ambulatory Visit (HOSPITAL_COMMUNITY)
Admission: RE | Admit: 2021-08-06 | Discharge: 2021-08-06 | Disposition: A | Discharge status: Home / Self Care | Payer: BC Managed Care – PPO | Source: Ambulatory Visit | Attending: Internal Medicine | Admitting: Internal Medicine

## 2021-08-06 ENCOUNTER — Encounter: Payer: Self-pay | Admitting: Internal Medicine

## 2021-08-06 ENCOUNTER — Ambulatory Visit: Payer: BC Managed Care – PPO | Admitting: Internal Medicine

## 2021-08-06 VITALS — BP 130/88 | HR 72 | Temp 97.1°F | Wt 218.0 lb

## 2021-08-06 DIAGNOSIS — R102 Pelvic and perineal pain: Secondary | ICD-10-CM

## 2021-08-06 DIAGNOSIS — Z1211 Encounter for screening for malignant neoplasm of colon: Secondary | ICD-10-CM

## 2021-08-06 DIAGNOSIS — Z113 Encounter for screening for infections with a predominantly sexual mode of transmission: Secondary | ICD-10-CM | POA: Diagnosis not present

## 2021-08-06 LAB — POCT URINALYSIS DIPSTICK
Bilirubin, UA: NEGATIVE
Blood, UA: NEGATIVE
Glucose, UA: NEGATIVE
Ketones, UA: NEGATIVE
Leukocytes, UA: NEGATIVE
Nitrite, UA: POSITIVE
Protein, UA: NEGATIVE
Spec Grav, UA: 1.015
Urobilinogen, UA: 0.2 U/dL
pH, UA: 5

## 2021-08-06 MED ORDER — HYDROCHLOROTHIAZIDE 12.5 MG PO CAPS: 12.5000 mg | ORAL_CAPSULE | Freq: Every day | ORAL | 0 refills | Status: DC

## 2021-08-06 MED ORDER — OMEPRAZOLE 40 MG PO CPDR: 40.0000 mg | DELAYED_RELEASE_CAPSULE | Freq: Two times a day (BID) | ORAL | 0 refills | Status: DC

## 2021-08-06 MED ORDER — POTASSIUM CHLORIDE ER 10 MEQ PO TBCR: 10.0000 meq | EXTENDED_RELEASE_TABLET | Freq: Every day | ORAL | 0 refills | Status: DC

## 2021-08-06 MED ORDER — NITROFURANTOIN MONOHYD MACRO 100 MG PO CAPS: 100.0000 mg | ORAL_CAPSULE | Freq: Two times a day (BID) | ORAL | 0 refills | Status: DC

## 2021-08-06 NOTE — Addendum Note (Signed)
Addended by: Kavin Leech E on: 08/06/2021 10:37 AM ? ? Modules accepted: Orders ? ?

## 2021-08-06 NOTE — Telephone Encounter (Signed)
FYI

## 2021-08-06 NOTE — Telephone Encounter (Signed)
?  Chief Complaint: Lower abdominal pain ?Symptoms: Pain lower abdomen, below navel, bladder area, constant 5/10 presently ?ncy: 4 days onset ?Pertinent Negatives: Patient denies fever, dysuria (Has been taking AZO) ?Disposition: [] ED /[] Urgent Care (no appt availability in office) / [x] Appointment(In office/virtual)/ []  [facility]/ [] Home Care/ [] Refused Recommended Disposition /[] [facility]/ []  Follow-up with PCP ?Additional Notes: Appt secured for this AM.  Care advise given, verbalizes understanding. ? Reason for Disposition ? [1] MODERATE pain (e.g., interferes with normal activities) AND [2] pain comes and goes (cramps) AND [3] present > 24 hours  (Exception: pain with Vomiting or Diarrhea - see that Guideline) ? ?Answer Assessment - Initial Assessment Questions ?1. LOCATION: "Where does it hurt?"  ?    Below navel ?2. RADIATION: "Does the pain shoot anywhere else?" (e.g., chest, back) ?    Lower back ?3. ONSET: "When did the pain begin?" (e.g., minutes, hours or days ago)  ?    4 days ago ?4. SUDDEN: "Gradual or sudden onset?" ?     ?5. PATTERN "Does the pain come and go, or is it constant?" ?   - If constant: "Is it getting better, staying the same, or worsening?"  ?    (Note: Constant means the pain never goes away completely; most serious pain is constant and it progresses)  ?   - If intermittent: "How long does it last?" "Do you have pain now?" ?    (Note: Intermittent means the pain goes away completely between bouts) ?    Varies ?6. SEVERITY: "How bad is the pain?"  (e.g., Scale 1-10; mild, moderate, or severe) ?  - MILD (1-3): doesn't interfere with normal activities, abdomen soft and not tender to touch  ?  - MODERATE (4-7): interferes with normal activities or awakens from sleep, abdomen tender to touch  ?  - SEVERE (8-10): excruciating pain, doubled over, unable to do any normal activities  ?    5/10 ?7. RECURRENT SYMPTOM: "Have you ever had this type of stomach pain  before?" If Yes, ask: "When was the last time?" and "What happened that time?"  ?     ?8. CAUSE: "What do you think is causing the stomach pain?" ?     ?9. RELIEVING/AGGRAVATING FACTORS: "What makes it better or worse?" (e.g., movement, antacids, bowel movement) ?     ?10. OTHER SYMPTOMS: "Do you have any other symptoms?" (e.g., back pain, diarrhea, fever, urination pain, vomiting) ?      Back pain ? ?Protocols used: Abdominal Pain - Female-A-AH ? ?

## 2021-08-06 NOTE — Progress Notes (Signed)
? ?Subjective:  ? ? Patient ID: Diana [name], female    DOB: 24-Jun-1960, 61 y.o.   MRN: [id number] ? ?HPI ? ?Patient presents to clinic today with complaint of lower abdominal pain.  This started 4 days ago.  She describes the pain as constant pressure/sharp pain. She denies urgency, frequency, dysuria or blood in her urine. She denies vaginal discharge, odor, irritation or abnormal bleeding. She reports her bowels are moving fine. She reports increased chronic low back pain. She has tried a heating pad and AZO OTC with minimal relief of symptoms. ? ?She does have a new sexual partner and would like STD screening. ? ?She would like her Cologuard ordered today.  She also needs refills on Omeprazole, HCTZ and Potassium. ? ?Review of Systems ? ?   ?Past Medical History:  ?Diagnosis Date  ? Allergy   ? Ear infection   ? recurrent  ? ? ?Current Outpatient Medications  ?Medication Sig Dispense Refill  ? Ascorbic Acid 125 MG CHEW Chew 1 each by mouth daily.    ? cetirizine (ZYRTEC) 10 MG tablet Take 10 mg by mouth daily as needed.    ? Cholecalciferol (VITAMIN D3) 25 MCG (1000 UT) CAPS Take 5,000 Units by mouth.     ? clonazePAM (KLONOPIN) 0.5 MG tablet Take 1 tablet (0.5 mg total) by mouth daily as needed. for anxiety 30 tablet 0  ? hydrochlorothiazide (MICROZIDE) 12.5 MG capsule Take 12.5 mg by mouth daily.    ? hydrocortisone (ANUSOL-HC) 2.5 % rectal cream PLACE 1 APPLICATION RECTALLY 2 TIMES DAILY. 30 g 0  ? ibuprofen (ADVIL) 200 MG tablet Take 400-600 mg by mouth every 6 (six) hours as needed.    ? omeprazole (PRILOSEC) 40 MG capsule Take 1 capsule (40 mg total) by mouth 2 (two) times daily. 180 capsule 0  ? ondansetron (ZOFRAN-ODT) 4 MG disintegrating tablet Take 1 tablet (4 mg total) by mouth every 8 (eight) hours as needed for nausea or vomiting. 30 tablet 0  ? potassium chloride (KLOR-CON) 10 MEQ tablet Take 10 mEq by mouth daily.    ? triamcinolone cream (KENALOG) 0.1 % Apply 1 application topically 2 (two)  times daily. 30 g 0  ? ?No current facility-administered medications for this visit.  ? ? ?No Known Allergies ? ?Family History  ?Problem Relation Age of Onset  ? Hypertension Mother   ?     alive  ? Arthritis Mother   ? Lung cancer Father   ?     deceased  ? Heart disease Father   ? Other Other   ?     1 brother and 1 sister both alive an healthy  ? Hypertension Maternal Grandmother   ? ? ?Social History  ? ?Socioeconomic History  ? Marital status: Widowed  ?  Spouse name: Not on file  ? Number of children: 3  ? Years of education: Not on file  ? Highest education level: Not on file  ?Occupational History  ? Occupation: customer service  ?  Employer: [employer]  ?Tobacco Use  ? Smoking status: Former  ?  Types: Cigarettes  ?  Quit date: 04/20/1985  ?  Years since quitting: 36.3  ? Smokeless tobacco: Never  ? Tobacco comments:  ?  remote tobacco use   ?Vaping Use  ? Vaping Use: Never used  ?Substance and Sexual Activity  ? Alcohol use: No  ?  Alcohol/week: 0.0 standard drinks  ? Drug use: No  ? Sexual activity:  Not Currently  ?Other Topics Concern  ? Not on file  ?Social History Narrative  ? Not on file  ? ?Social Determinants of Health  ? ?Financial Resource Strain: Not on file  ?Food Insecurity: Not on file  ?Transportation Needs: Not on file  ?Physical Activity: Not on file  ?Stress: Not on file  ?Social Connections: Not on file  ?Intimate Partner Violence: Not on file  ? ? ? ?Constitutional: Denies fever, malaise, fatigue, headache or abrupt weight changes.  ?HEENT: Denies eye pain, eye redness, ear pain, ringing in the ears, wax buildup, runny nose, nasal congestion, bloody nose, or sore throat. ?Respiratory: Denies difficulty breathing, shortness of breath, cough or sputum production.   ?Cardiovascular: Denies chest pain, chest tightness, palpitations or swelling in the hands or feet.  ?Gastrointestinal: Patient reports lower abdominal pain.  Denies bloating, constipation, diarrhea or blood in the stool.   ?GU: Denies urgency, frequency, pain with urination, burning sensation, blood in urine, odor or discharge. ?Musculoskeletal: Pt reports low back pain. Denies decrease in range of motion, difficulty with gait, or joint swelling.  ?Skin: Denies redness, rashes, lesions or ulcercations.  ? ?No other specific complaints in a complete review of systems (except as listed in HPI above). ? ?Objective:  ? Physical Exam ?BP 130/88 (BP Location: Left Arm, Patient Position: Sitting, Cuff Size: Large)   Pulse 72   Temp (!) 97.1 ?F (36.2 ?C) (Temporal)   Wt 218 lb (98.9 kg)   LMP 01/20/2016   SpO2 99%   BMI 34.14 kg/m?  ? ?Wt Readings from Last 3 Encounters:  ?07/28/21 220 lb 2 oz (99.8 kg)  ?07/03/21 212 lb (96.2 kg)  ?07/02/21 210 lb 15.7 oz (95.7 kg)  ? ? ?General: Appears her stated age, obese, in NAD. ?Cardiovascular: Normal rate and rhythm. S1,S2 noted.  No murmur, rubs or gallops noted.  ?Pulmonary/Chest: Normal effort and positive vesicular breath sounds. No respiratory distress. No wheezes, rales or ronchi noted.  ?Abdomen: Soft and tender in suprapubic area. Normal bowel sounds. No distention or masses noted.  ?Neurological: Alert and oriented.  ? ?BMET ?   ?Component Value Date/Time  ? NA 141 07/02/2021 2000  ? K 3.2 (L) 07/02/2021 2000  ? CL 100 07/02/2021 2000  ? CO2 30 07/02/2021 2000  ? GLUCOSE 109 (H) 07/02/2021 2000  ? BUN 14 07/02/2021 2000  ? CREATININE 0.64 07/02/2021 2000  ? CREATININE 0.66 05/14/2021 1030  ? CALCIUM 9.3 07/02/2021 2000  ? GFRNONAA >60 07/02/2021 2000  ? GFRAA >60 07/27/2017 2153  ? ? ?Lipid Panel  ?   ?Component Value Date/Time  ? CHOL 189 07/28/2021 1553  ? TRIG 120 07/28/2021 1553  ? HDL 27 (L) 02/18/2021 1016  ? CHOLHDL 6.3 (H) 02/18/2021 1016  ? VLDL 21.0 02/09/2019 1245  ? LDLCALC 128 (H) 02/18/2021 1016  ? ? ?CBC ?   ?Component Value Date/Time  ? WBC 3.9 (L) 07/02/2021 2000  ? RBC 4.73 07/02/2021 2000  ? HGB 14.1 07/02/2021 2000  ? HCT 41.5 07/02/2021 2000  ? PLT 219 07/02/2021  2000  ? MCV 87.7 07/02/2021 2000  ? MCH 29.8 07/02/2021 2000  ? MCHC 34.0 07/02/2021 2000  ? RDW 13.5 07/02/2021 2000  ? LYMPHSABS 1.1 07/28/2016 1232  ? MONOABS 0.4 07/28/2016 1232  ? EOSABS 0.0 07/28/2016 1232  ? BASOSABS 0.0 07/28/2016 1232  ? ? ?Hgb A1C ?Lab Results  ?Component Value Date  ? HGBA1C 5.9 (H) 02/18/2021  ? ? ? ? ? ? ?   ?  Assessment & Plan:  ?Suprapubic Pain, Screen for STD: ? ?Urinalysis: Positive nitrites ?We will send urine culture ?Push fluids ?Rx for Macrobid 100 mg 2 times daily for 5 days ?Wet prep to check for gonorrhea, chlamydia, trichomonas, BV and yeast ? ?RTC in 1 month for follow-up of chronic conditions ?Nicki Reaperegina Taiwana Willison, NP ? ?

## 2021-08-06 NOTE — Telephone Encounter (Signed)
Pt already seen

## 2021-08-06 NOTE — Patient Instructions (Signed)
Urinary Tract Infection, Adult A urinary tract infection (UTI) is an infection of any part of the urinary tract. The urinary tract includes: The kidneys. The ureters. The bladder. The urethra. These organs make, store, and get rid of pee (urine) in the body. What are the causes? This infection is caused by germs (bacteria) in your genital area. These germs grow and cause swelling (inflammation) of your urinary tract. What increases the risk? The following factors may make you more likely to develop this condition: Using a small, thin tube (catheter) to drain pee. Not being able to control when you pee or poop (incontinence). Being female. If you are female, these things can increase the risk: Using these methods to prevent pregnancy: A medicine that kills sperm (spermicide). A device that blocks sperm (diaphragm). Having low levels of a female hormone (estrogen). Being pregnant. You are more likely to develop this condition if: You have genes that add to your risk. You are sexually active. You take antibiotic medicines. You have trouble peeing because of: A prostate that is bigger than normal, if you are female. A blockage in the part of your body that drains pee from the bladder. A kidney stone. A nerve condition that affects your bladder. Not getting enough to drink. Not peeing often enough. You have other conditions, such as: Diabetes. A weak disease-fighting system (immune system). Sickle cell disease. Gout. Injury of the spine. What are the signs or symptoms? Symptoms of this condition include: Needing to pee right away. Peeing small amounts often. Pain or burning when peeing. Blood in the pee. Pee that smells bad or not like normal. Trouble peeing. Pee that is cloudy. Fluid coming from the vagina, if you are female. Pain in the belly or lower back. Other symptoms include: Vomiting. Not feeling hungry. Feeling mixed up (confused). This may be the first symptom in  older adults. Being tired and grouchy (irritable). A fever. Watery poop (diarrhea). How is this treated? Taking antibiotic medicine. Taking other medicines. Drinking enough water. In some cases, you may need to see a specialist. Follow these instructions at home:  Medicines Take over-the-counter and prescription medicines only as told by your doctor. If you were prescribed an antibiotic medicine, take it as told by your doctor. Do not stop taking it even if you start to feel better. General instructions Make sure you: Pee until your bladder is empty. Do not hold pee for a long time. Empty your bladder after sex. Wipe from front to back after peeing or pooping if you are a female. Use each tissue one time when you wipe. Drink enough fluid to keep your pee pale yellow. Keep all follow-up visits. Contact a doctor if: You do not get better after 1-2 days. Your symptoms go away and then come back. Get help right away if: You have very bad back pain. You have very bad pain in your lower belly. You have a fever. You have chills. You feeling like you will vomit or you vomit. Summary A urinary tract infection (UTI) is an infection of any part of the urinary tract. This condition is caused by germs in your genital area. There are many risk factors for a UTI. Treatment includes antibiotic medicines. Drink enough fluid to keep your pee pale yellow. This information is not intended to replace advice given to you by your health care provider. Make sure you discuss any questions you have with your health care provider. Document Revised: 11/17/2019 Document Reviewed: 11/17/2019 Elsevier Patient Education    2023 Else[employer] Inc[employer]  

## 2021-08-07 LAB — URINE CULTURE
MICRO NUMBER:: 13285573
SPECIMEN QUALITY:: ADEQUATE

## 2021-08-08 ENCOUNTER — Other Ambulatory Visit: Payer: Self-pay | Admitting: Internal Medicine

## 2021-08-08 ENCOUNTER — Encounter: Payer: Self-pay | Admitting: Internal Medicine

## 2021-08-08 LAB — CERVICOVAGINAL ANCILLARY ONLY
Bacterial Vaginitis (gardnerella): POSITIVE — AB
Candida Glabrata: NEGATIVE
Candida Vaginitis: NEGATIVE
Chlamydia: NEGATIVE
Comment: NEGATIVE
Comment: NEGATIVE
Comment: NEGATIVE
Comment: NEGATIVE
Comment: NEGATIVE
Comment: NORMAL
Neisseria Gonorrhea: NEGATIVE
Trichomonas: NEGATIVE

## 2021-08-08 MED ORDER — METRONIDAZOLE 500 MG PO TABS
500.0000 mg | ORAL_TABLET | Freq: Two times a day (BID) | ORAL | 0 refills | Status: DC
Start: 2021-08-08 — End: 2021-08-15

## 2021-08-15 ENCOUNTER — Ambulatory Visit: Payer: BC Managed Care – PPO | Admitting: Internal Medicine

## 2021-08-15 ENCOUNTER — Ambulatory Visit: Payer: Self-pay | Admitting: *Deleted

## 2021-08-15 ENCOUNTER — Encounter: Payer: Self-pay | Admitting: Internal Medicine

## 2021-08-15 VITALS — BP 122/86 | HR 69 | Temp 97.3°F

## 2021-08-15 DIAGNOSIS — H01135 Eczematous dermatitis of left lower eyelid: Secondary | ICD-10-CM | POA: Diagnosis not present

## 2021-08-15 DIAGNOSIS — H01131 Eczematous dermatitis of right upper eyelid: Secondary | ICD-10-CM

## 2021-08-15 DIAGNOSIS — H01132 Eczematous dermatitis of right lower eyelid: Secondary | ICD-10-CM

## 2021-08-15 DIAGNOSIS — H01134 Eczematous dermatitis of left upper eyelid: Secondary | ICD-10-CM

## 2021-08-15 DIAGNOSIS — R102 Pelvic and perineal pain: Secondary | ICD-10-CM | POA: Diagnosis not present

## 2021-08-15 LAB — POCT URINALYSIS DIPSTICK
Bilirubin, UA: NEGATIVE
Blood, UA: NEGATIVE
Glucose, UA: NEGATIVE
Ketones, UA: NEGATIVE
Leukocytes, UA: NEGATIVE
Nitrite, UA: NEGATIVE
Protein, UA: NEGATIVE
Spec Grav, UA: 1.015 (ref 1.010–1.025)
Urobilinogen, UA: 0.2 E.U./dL
pH, UA: 5 (ref 5.0–8.0)

## 2021-08-15 NOTE — Telephone Encounter (Signed)
I returned pt's call.   C/o urinary burning and frequency after being seen recently and treated with an antibiotic which she completed but still having symptoms. ? ? ?Reason for Disposition ? Age > 50 years ? ?Answer Assessment - Initial Assessment Questions ?1. SEVERITY: "How bad is the pain?"  (e.g., Scale 1-10; mild, moderate, or severe) ?  - MILD (1-3): complains slightly about urination hurting ?  - MODERATE (4-7): interferes with normal activities   ?  - SEVERE (8-10): excruciating, unwilling or unable to urinate because of the pain  ?    I had pain in lower pelvic region.   I also had BV.   So I was given 2 antibiotics for BV and UTI. ?I have a burning sensation in general and urgency.    I may burn for hours and it can be intense.    ?2. FREQUENCY: "How many times have you had painful urination today?"  ?    Not  with urination ?No vaginal itching or discharge.   I am incontinent of urine so I wear a pad. ?3. PATTERN: "Is pain present every time you urinate or just sometimes?"  ?    *No Answer* ?4. ONSET: "When did the painful urination start?"  ?    *No Answer* ?5. FEVER: "Do you have a fever?" If Yes, ask: "What is your temperature, how was it measured, and when did it start?" ?    *No Answer* ?6. PAST UTI: "Have you had a urine infection before?" If Yes, ask: "When was the last time?" and "What happened that time?"  ?    *No Answer* ?7. CAUSE: "What do you think is causing the painful urination?"  (e.g., UTI, scratch, Herpes sore) ?    *No Answer* ?8. OTHER SYMPTOMS: "Do you have any other symptoms?" (e.g., flank pain, vaginal discharge, genital sores, urgency, blood in urine) ?    *No Answer* ?9. PREGNANCY: "Is there any chance you are pregnant?" "When was your last menstrual period?" ?    *No Answer* ? ?Protocols used: Urination Pain - Female-A-AH ? ?

## 2021-08-15 NOTE — Telephone Encounter (Signed)
?  Chief Complaint: generalized burning, not with urination and having urinary frequency after completing the antibiotic for UTI (Also treated for BV) ?Symptoms: Generalized burning around urethra and urinary frequency ?Frequency: Yes with urination and urgency ?Pertinent Negatives: Patient denies blood in urine or pelvic pain or fever or burning with urination just a generalized burning that can be intense at times. ?Disposition: [] ED /[] Urgent Care (no appt availability in office) / [x] Appointment(In office/virtual)/ []  [facility]/ [] Home Care/ [] Refused Recommended Disposition /[] [facility]/ []  Follow-up with PCP ?Additional Notes: Appt made with instructions to go to the urgent care over the weekend for worsening of symptoms.  ?

## 2021-08-15 NOTE — Patient Instructions (Signed)
Pelvic Pain, Female Pelvic pain is pain in your lower belly (abdomen), below your belly button and between your hips. The pain may: Start all of a sudden (be acute). Keep coming back (be recurring). Last a long time (become chronic). Pelvic pain that lasts longer than 6 months is called chronic pelvic pain. There are many causes of pelvic pain. Sometimes the cause of pelvic pain is not known. Follow these instructions at home:  Take over-the-counter and prescription medicines only as told by your doctor. Rest as told by your doctor. Do not have sex if it hurts. Keep a journal of your pelvic pain. Write down: When the pain started. Where the pain is located. What seems to make the pain better or worse, such as food or your monthly period (menstrual cycle). Any symptoms you have along with the pain. Keep all follow-up visits. Contact a doctor if: Medicine does not help your pain, or your pain comes back. You have new symptoms. You have unusual discharge or bleeding from your vagina. You have a fever or chills. You are having trouble pooping (constipation). You have blood in your pee (urine) or poop (stool). Your pee smells bad. You feel weak or light-headed. Get help right away if: You have sudden pain that is very bad. You have very bad pain and also have any of these symptoms: A fever. Feeling like you may vomit (nauseous). Vomiting. Being very sweaty. You faint. These symptoms may be an emergency. Get help right away. Call your local emergency services (911 in the [place] Do not wait to see if the symptoms will go away. Do not drive yourself to the hospital. Summary Pelvic pain is pain in your lower belly (abdomen), below your belly button and between your hips. There are many causes of pelvic pain. Keep a journal of your pelvic pain. This information is not intended to replace advice given to you by your health care provider. Make sure you discuss any questions you have with  your health care provider. Document Revised: 08/13/2020 Document Reviewed: 08/13/2020 Elsevier Patient Education  2023 Elsevier Inc.   

## 2021-08-15 NOTE — Progress Notes (Signed)
? ?Subjective:  ? ? Patient ID: Diana [name], female    DOB: 08/19/1960, 61 y.o.   MRN: [id number] ? ?HPI ? ?Patient presents to clinic today with complaint of pelvic pain.  This has been an ongoing issue.  She describes the pain as a burning sensation on the inside. Recent urinalysis was concerning for infection but culture did not grow any bacteria.  Wet prep did test positive for BV which she was treated with Metronidazole.  She reports her symptoms have not improved.  She denies urinary urgency, frequency, dysuria, blood in her urine, vaginal discharge, vaginal odor, vaginal irritation, painful intercourse or abnormal vaginal bleeding. She has had a partial hysterectomy. ? ?She also reports swelling and rash around her eyes, R>L. This has been an intermittent issue over the last year. She denies eye itching, discharge or redness. She has been treated for viral conjunctivitis, bacterial conjunctivitis and atopic dermatitis in the past with minimal relief of symptoms. She has seen ophthalmology in the past for the same. ? ?Review of Systems ? ?   ?Past Medical History:  ?Diagnosis Date  ? Allergy   ? Ear infection   ? recurrent  ? ? ?Current Outpatient Medications  ?Medication Sig Dispense Refill  ? Ascorbic Acid 125 MG CHEW Chew 1 each by mouth daily.    ? cetirizine (ZYRTEC) 10 MG tablet Take 10 mg by mouth daily as needed.    ? Cholecalciferol (VITAMIN D3) 25 MCG (1000 UT) CAPS Take 5,000 Units by mouth.     ? clonazePAM (KLONOPIN) 0.5 MG tablet Take 1 tablet (0.5 mg total) by mouth daily as needed. for anxiety 30 tablet 0  ? hydrochlorothiazide (MICROZIDE) 12.5 MG capsule Take 1 capsule (12.5 mg total) by mouth daily. 90 capsule 0  ? hydrocortisone (ANUSOL-HC) 2.5 % rectal cream PLACE 1 APPLICATION RECTALLY 2 TIMES DAILY. 30 g 0  ? ibuprofen (ADVIL) 200 MG tablet Take 400-600 mg by mouth every 6 (six) hours as needed.    ? metroNIDAZOLE (FLAGYL) 500 MG tablet Take 1 tablet (500 mg total) by mouth 2 (two)  times daily for 7 days. 14 tablet 0  ? nitrofurantoin, macrocrystal-monohydrate, (MACROBID) 100 MG capsule Take 1 capsule (100 mg total) by mouth 2 (two) times daily. 10 capsule 0  ? omeprazole (PRILOSEC) 40 MG capsule Take 1 capsule (40 mg total) by mouth 2 (two) times daily. 180 capsule 0  ? ondansetron (ZOFRAN-ODT) 4 MG disintegrating tablet Take 1 tablet (4 mg total) by mouth every 8 (eight) hours as needed for nausea or vomiting. 30 tablet 0  ? potassium chloride (KLOR-CON) 10 MEQ tablet Take 1 tablet (10 mEq total) by mouth daily. 90 tablet 0  ? triamcinolone cream (KENALOG) 0.1 % Apply 1 application topically 2 (two) times daily. 30 g 0  ? ?No current facility-administered medications for this visit.  ? ? ?No Known Allergies ? ?Family History  ?Problem Relation Age of Onset  ? Hypertension Mother   ?     alive  ? Arthritis Mother   ? Lung cancer Father   ?     deceased  ? Heart disease Father   ? Other Other   ?     1 brother and 1 sister both alive an healthy  ? Hypertension Maternal Grandmother   ? ? ?Social History  ? ?Socioeconomic History  ? Marital status: Widowed  ?  Spouse name: Not on file  ? Number of children: 3  ? Years of education:  Not on file  ? Highest education level: Not on file  ?Occupational History  ? Occupation: customer service  ?  Employer: [employer]  ?Tobacco Use  ? Smoking status: Former  ?  Types: Cigarettes  ?  Quit date: 04/20/1985  ?  Years since quitting: 36.3  ? Smokeless tobacco: Never  ? Tobacco comments:  ?  remote tobacco use   ?Vaping Use  ? Vaping Use: Never used  ?Substance and Sexual Activity  ? Alcohol use: No  ?  Alcohol/week: 0.0 standard drinks  ? Drug use: No  ? Sexual activity: Not Currently  ?Other Topics Concern  ? Not on file  ?Social History Narrative  ? Not on file  ? ?Social Determinants of Health  ? ?Financial Resource Strain: Not on file  ?Food Insecurity: Not on file  ?Transportation Needs: Not on file  ?Physical Activity: Not on file  ?Stress: Not on  file  ?Social Connections: Not on file  ?Intimate Partner Violence: Not on file  ? ? ? ?Constitutional: Denies fever, malaise, fatigue, headache or abrupt weight changes.  ?ENT: Pt reports swelling around eyes. Denies eye pain, redness, discharge or vision changes. ?Respiratory: Denies difficulty breathing, shortness of breath, cough or sputum production.   ?Cardiovascular: Denies chest pain, chest tightness, palpitations or swelling in the hands or feet.  ?Gastrointestinal: Pt reports pelvic pain. Denies bloating, constipation, diarrhea or blood in the stool.  ?GU:  Denies urgency, frequency, pain with urination, blood in urine, odor or discharge. ? ?No other specific complaints in a complete review of systems (except as listed in HPI above). ? ?Objective:  ? Physical Exam ? ?BP 122/86 (BP Location: Right Arm, Patient Position: Sitting, Cuff Size: Large)   Pulse 69   Temp (!) 97.3 ?F (36.3 ?C) (Temporal)   LMP 01/20/2016   SpO2 99%  ? ?Wt Readings from Last 3 Encounters:  ?08/06/21 218 lb (98.9 kg)  ?07/28/21 220 lb 2 oz (99.8 kg)  ?07/03/21 212 lb (96.2 kg)  ? ? ?General: Appears her stated age, obese, in NAD. ?ENT: Slight periorbital edema noted bilaterally with scaly rash. Sclera white, EOM's intact. ?Cardiovascular: Normal rate and rhythm. S1,S2 noted.  No murmur, rubs or gallops noted.  ?Pulmonary/Chest: Normal effort and positive vesicular breath sounds. No respiratory distress. No wheezes, rales or ronchi noted.  ?Abdomen: Soft, mild discomfort in bilateral pelvic region. Normal bowel sounds. No distention or masses noted.  ?Pelvic: She declines pelvic exam today. ?Musculoskeletal: Normal range of motion. ?Neurological: Alert and oriented.  ?BMET ?   ?Component Value Date/Time  ? NA 141 07/02/2021 2000  ? K 3.2 (L) 07/02/2021 2000  ? CL 100 07/02/2021 2000  ? CO2 30 07/02/2021 2000  ? GLUCOSE 109 (H) 07/02/2021 2000  ? BUN 14 07/02/2021 2000  ? CREATININE 0.64 07/02/2021 2000  ? CREATININE 0.66  05/14/2021 1030  ? CALCIUM 9.3 07/02/2021 2000  ? GFRNONAA >60 07/02/2021 2000  ? GFRAA >60 07/27/2017 2153  ? ? ?Lipid Panel  ?   ?Component Value Date/Time  ? CHOL 189 07/28/2021 1553  ? TRIG 120 07/28/2021 1553  ? HDL 27 (L) 02/18/2021 1016  ? CHOLHDL 6.3 (H) 02/18/2021 1016  ? VLDL 21.0 02/09/2019 1245  ? LDLCALC 128 (H) 02/18/2021 1016  ? ? ?CBC ?   ?Component Value Date/Time  ? WBC 3.9 (L) 07/02/2021 2000  ? RBC 4.73 07/02/2021 2000  ? HGB 14.1 07/02/2021 2000  ? HCT 41.5 07/02/2021 2000  ? PLT  219 07/02/2021 2000  ? MCV 87.7 07/02/2021 2000  ? MCH 29.8 07/02/2021 2000  ? MCHC 34.0 07/02/2021 2000  ? RDW 13.5 07/02/2021 2000  ? LYMPHSABS 1.1 07/28/2016 1232  ? MONOABS 0.4 07/28/2016 1232  ? EOSABS 0.0 07/28/2016 1232  ? BASOSABS 0.0 07/28/2016 1232  ? ? ?Hgb A1C ?Lab Results  ?Component Value Date  ? HGBA1C 5.9 (H) 02/18/2021  ? ? ? ? ? ? ?   ?Assessment & Plan:  ? ?Pelvic Pain: ? ?Repeat urinalysis normal ?No indication to send for urine culture ?Discussed pelvic ultrasound but given her partial hysterectomy, may not reveal much ?Will refer to GYN for further evaluation of symptoms ? ?Periorbital Eczema: ? ?Continue Triamcinolone 0.1% BID prn ?Consider taking daily antihistamine ? ?RTC in 1 month, follow up chronic conditions ? ? ?Webb Silversmith, NP ? ?

## 2021-08-15 NOTE — Telephone Encounter (Signed)
I will see her today at 1 if she wants to come in ?

## 2021-08-18 ENCOUNTER — Ambulatory Visit: Payer: BC Managed Care – PPO | Admitting: Internal Medicine

## 2021-08-18 ENCOUNTER — Other Ambulatory Visit: Payer: Self-pay | Admitting: Internal Medicine

## 2021-08-18 ENCOUNTER — Institutional Professional Consult (permissible substitution): Payer: BC Managed Care – PPO | Admitting: Primary Care

## 2021-08-18 DIAGNOSIS — K644 Residual hemorrhoidal skin tags: Secondary | ICD-10-CM

## 2021-08-19 NOTE — Telephone Encounter (Signed)
Requested medication (s) are due for refill today: yes ? ?Requested medication (s) are on the active medication list: yes ? ?Last refill:  05/27/21 ? ?Future visit scheduled: yes ? ?Notes to clinic:  Unable to refill per protocol, cannot delegate. ? ?  ?Requested Prescriptions  ?Pending Prescriptions Disp Refills  ? hydrocortisone (ANUSOL-HC) 2.5 % rectal cream [Pharmacy Med Name: HYDROCORTISONE 2.5% CREAM 2.5 Cream] 30 g 0  ?  Sig: PLACE 1 APPLICATION RECTALLY 2 TIMES DAILY.  ?  ? Not Delegated - Over the Counter: OTC 2 Failed - 08/18/2021  3:03 PM  ?  ?  Failed - This refill cannot be delegated  ?  ?  Passed - Valid encounter within last 12 months  ?  Recent Outpatient Visits   ? ?      ? 4 days ago Pelvic pain  ? [facility] [place], [place] W, NP  ? 1 week ago Suprapubic pain  ? [facility] [place], [place] W, NP  ? 1 month ago Acute bilateral thoracic back pain  ? [facility] [place], [place] W, NP  ? 3 months ago Upper back pain  ? [facility] [place], [place] W, NP  ? 3 months ago Hypokalemia  ? [facility] [place], Salvadore Oxford, NP  ? ?  ?  ?Future Appointments   ? ?        ? In 2 weeks Baity, Salvadore Oxford, NP [facility]  ? ?  ? ? ?  ?  ?  ? ?

## 2021-09-05 ENCOUNTER — Ambulatory Visit: Payer: BC Managed Care – PPO | Admitting: Internal Medicine

## 2021-09-05 NOTE — Progress Notes (Deleted)
Subjective:    Patient ID: Diana [name], female    DOB: 1960/10/03, 61 y.o.   MRN: [id number]  HPI  Pt presents to the clinic today for follow up of chronic conditions.  HTN: Her BP today is. She is taking HCTZ and Potassium as prescribed. ECG from 06/2021 reviewed.  Anxiety: Persistent. She takes Clonazepam as needed with good results. She is not currently seeing a therapist. She denies depression, SI/HI.  GERD: Triggered by. She denies breakthrough on Omeprazole. There is no upper GI on file.  History of Hep B: s/p antiviral therapy. She does not follow with GI anymore.  Intermittent Low Back Pain: She takes Ibuprofen as needed with good relief of symptoms.  MRI lumbar spine from 04/2014 reviewed.  She does not follow with orthopedics.  Prediabetes: Her last A1c was 5.9%, 02/2021.  She is not taking any oral diabetic medication at this time.  She does not check her sugars.  HLD: Her last LDL was 128, triglycerides 120, 02/2021.  She is not taking any cholesterol-lowering medication at this time.  She does not consume a low-fat diet.  Neutropenia: Her last WBC was 3.9%, 06/2021.  She does not follow with hematology. Review of Systems     Past Medical History:  Diagnosis Date   Allergy    Ear infection    recurrent    Current Outpatient Medications  Medication Sig Dispense Refill   Ascorbic Acid 125 MG CHEW Chew 1 each by mouth daily.     cetirizine (ZYRTEC) 10 MG tablet Take 10 mg by mouth daily as needed.     Cholecalciferol (VITAMIN D3) 25 MCG (1000 UT) CAPS Take 5,000 Units by mouth.      clonazePAM (KLONOPIN) 0.5 MG tablet Take 1 tablet (0.5 mg total) by mouth daily as needed. for anxiety 30 tablet 0   hydrochlorothiazide (MICROZIDE) 12.5 MG capsule Take 1 capsule (12.5 mg total) by mouth daily. 90 capsule 0   hydrocortisone (ANUSOL-HC) 2.5 % rectal cream PLACE 1 APPLICATION RECTALLY 2 TIMES DAILY. 30 g 0   ibuprofen (ADVIL) 200 MG tablet Take 400-600 mg by mouth every  6 (six) hours as needed.     omeprazole (PRILOSEC) 40 MG capsule Take 1 capsule (40 mg total) by mouth 2 (two) times daily. 180 capsule 0   ondansetron (ZOFRAN-ODT) 4 MG disintegrating tablet Take 1 tablet (4 mg total) by mouth every 8 (eight) hours as needed for nausea or vomiting. 30 tablet 0   potassium chloride (KLOR-CON) 10 MEQ tablet Take 1 tablet (10 mEq total) by mouth daily. 90 tablet 0   triamcinolone cream (KENALOG) 0.1 % Apply 1 application topically 2 (two) times daily. 30 g 0   No current facility-administered medications for this visit.    No Known Allergies  Family History  Problem Relation Age of Onset   Hypertension Mother        alive   Arthritis Mother    Lung cancer Father        deceased   Heart disease Father    Other Other        1 brother and 1 sister both alive an healthy   Hypertension Maternal Grandmother     Social History   Socioeconomic History   Marital status: Widowed    Spouse name: Not on file   Number of children: 3   Years of education: Not on file   Highest education level: Not on file  Occupational History   Occupation:  customer service    Employer: [employer]  Tobacco Use   Smoking status: Former    Types: Cigarettes    Quit date: 04/20/1985    Years since quitting: 36.4   Smokeless tobacco: Never   Tobacco comments:    remote tobacco use   Vaping Use   Vaping Use: Never used  Substance and Sexual Activity   Alcohol use: No    Alcohol/week: 0.0 standard drinks   Drug use: No   Sexual activity: Not Currently  Other Topics Concern   Not on file  Social History Narrative   Not on file   Social Determinants of Health   Financial Resource Strain: Not on file  Food Insecurity: Not on file  Transportation Needs: Not on file  Physical Activity: Not on file  Stress: Not on file  Social Connections: Not on file  Intimate Partner Violence: Not on file     Constitutional: Denies fever, malaise, fatigue, headache or abrupt  weight changes.  HEENT: Denies eye pain, eye redness, ear pain, ringing in the ears, wax buildup, runny nose, nasal congestion, bloody nose, or sore throat. Respiratory: Denies difficulty breathing, shortness of breath, cough or sputum production.   Cardiovascular: Denies chest pain, chest tightness, palpitations or swelling in the hands or feet.  Gastrointestinal: Denies abdominal pain, bloating, constipation, diarrhea or blood in the stool.  GU: Denies urgency, frequency, pain with urination, burning sensation, blood in urine, odor or discharge. Musculoskeletal: Patient reports intermittent low back pain.  Denies decrease in range of motion, difficulty with gait, or joint swelling.  Skin: Denies redness, rashes, lesions or ulcercations.  Neurological: Denies dizziness, difficulty with memory, difficulty with speech or problems with balance and coordination.  Psych: Patient reports anxiety.  Denies depression, SI/HI.  No other specific complaints in a complete review of systems (except as listed in HPI above).  Objective:   Physical Exam   LMP 01/20/2016  Wt Readings from Last 3 Encounters:  08/06/21 218 lb (98.9 kg)  07/28/21 220 lb 2 oz (99.8 kg)  07/03/21 212 lb (96.2 kg)    General: Appears their stated age, well developed, well nourished in NAD. Skin: Warm, dry and intact. No rashes, lesions or ulcerations noted. HEENT: Head: normal shape and size; Eyes: sclera white, no icterus, conjunctiva pink, PERRLA and EOMs intact; Ears: Tm's gray and intact, normal light reflex; Nose: mucosa pink and moist, septum midline; Throat/Mouth: Teeth present, mucosa pink and moist, no exudate, lesions or ulcerations noted.  Neck:  Neck supple, trachea midline. No masses, lumps or thyromegaly present.  Cardiovascular: Normal rate and rhythm. S1,S2 noted.  No murmur, rubs or gallops noted. No JVD or BLE edema. No carotid bruits noted. Pulmonary/Chest: Normal effort and positive vesicular breath  sounds. No respiratory distress. No wheezes, rales or ronchi noted.  Abdomen: Soft and nontender. Normal bowel sounds. No distention or masses noted. Liver, spleen and kidneys non palpable. Musculoskeletal: Normal range of motion. No signs of joint swelling. No difficulty with gait.  Neurological: Alert and oriented. Cranial nerves II-XII grossly intact. Coordination normal.  Psychiatric: Mood and affect normal. Behavior is normal. Judgment and thought content normal.     BMET    Component Value Date/Time   NA 141 07/02/2021 2000   K 3.2 (L) 07/02/2021 2000   CL 100 07/02/2021 2000   CO2 30 07/02/2021 2000   GLUCOSE 109 (H) 07/02/2021 2000   BUN 14 07/02/2021 2000   CREATININE 0.64 07/02/2021 2000   CREATININE  0.66 05/14/2021 1030   CALCIUM 9.3 07/02/2021 2000   GFRNONAA >60 07/02/2021 2000   GFRAA >60 07/27/2017 2153    Lipid Panel     Component Value Date/Time   CHOL 189 07/28/2021 1553   TRIG 120 07/28/2021 1553   HDL 27 (L) 02/18/2021 1016   CHOLHDL 6.3 (H) 02/18/2021 1016   VLDL 21.0 02/09/2019 1245   LDLCALC 128 (H) 02/18/2021 1016    CBC    Component Value Date/Time   WBC 3.9 (L) 07/02/2021 2000   RBC 4.73 07/02/2021 2000   HGB 14.1 07/02/2021 2000   HCT 41.5 07/02/2021 2000   PLT 219 07/02/2021 2000   MCV 87.7 07/02/2021 2000   MCH 29.8 07/02/2021 2000   MCHC 34.0 07/02/2021 2000   RDW 13.5 07/02/2021 2000   LYMPHSABS 1.1 07/28/2016 1232   MONOABS 0.4 07/28/2016 1232   EOSABS 0.0 07/28/2016 1232   BASOSABS 0.0 07/28/2016 1232    Hgb A1C Lab Results  Component Value Date   HGBA1C 5.9 (H) 02/18/2021           Assessment & Plan:    Diana Reaperegina Shalandria Elsbernd, NP

## 2021-09-08 DIAGNOSIS — H01112 Allergic dermatitis of right lower eyelid: Secondary | ICD-10-CM | POA: Diagnosis not present

## 2021-09-08 DIAGNOSIS — H01111 Allergic dermatitis of right upper eyelid: Secondary | ICD-10-CM | POA: Diagnosis not present

## 2021-09-08 DIAGNOSIS — B081 Molluscum contagiosum: Secondary | ICD-10-CM | POA: Diagnosis not present

## 2021-09-09 ENCOUNTER — Ambulatory Visit: Payer: BC Managed Care – PPO | Admitting: Internal Medicine

## 2021-09-09 NOTE — Progress Notes (Unsigned)
Subjective:    Patient ID: Diana [name], female    DOB: 11/01/1960, [age] y.o.   MRN: [id number]  HPI  Patient presents to clinic today for follow-up of chronic conditions.  HTN: Her BP today is.  She is taking HCTZ and Potassium as prescribed.  ECG from 06/2021 reviewed.  Anxiety and Depression: Persistent.  She is taking Clonazepam as needed.  She is not currently seeing a therapist.  She denies SI/HI.  Chronic Constipation: She tries to manage this with diet.  She takes as needed with good relief of symptoms.  She does not follow with GI  GERD: Triggered by.  She denies breakthrough on Omeprazole.  There is no upper GI on file.  History of Hepatitis B: Status post antiviral therapy.  She no longer follows with GI.  Intermittent Low Back Pain:  She takes Ibuprofen as needed with good relief of symptoms.  She is not following with orthopedics.  Prediabetes: Her last A1c was 5.9%, 02/2021.  She is not taking any oral diabetic medication at this time.  She does not check her sugars.  Neutropenia: Her last WBC count was 3.9%, 06/2021.  She does not follow with hematology.  HLD: Her last LDL was 128, triglycerides 120, 02/2021.  She is not taking any cholesterol-lowering medication at this time.  She does not consume a low-fat diet.  Review of Systems     Past Medical History:  Diagnosis Date   Allergy    Ear infection    recurrent    Current Outpatient Medications  Medication Sig Dispense Refill   Ascorbic Acid 125 MG CHEW Chew 1 each by mouth daily.     cetirizine (ZYRTEC) 10 MG tablet Take 10 mg by mouth daily as needed.     Cholecalciferol (VITAMIN D3) 25 MCG (1000 UT) CAPS Take 5,000 Units by mouth.      clonazePAM (KLONOPIN) 0.5 MG tablet Take 1 tablet (0.5 mg total) by mouth daily as needed. for anxiety 30 tablet 0   hydrochlorothiazide (MICROZIDE) 12.5 MG capsule Take 1 capsule (12.5 mg total) by mouth daily. 90 capsule 0   hydrocortisone (ANUSOL-HC) 2.5 % rectal cream  PLACE 1 APPLICATION RECTALLY 2 TIMES DAILY. 30 g 0   ibuprofen (ADVIL) 200 MG tablet Take 400-600 mg by mouth every 6 (six) hours as needed.     omeprazole (PRILOSEC) 40 MG capsule Take 1 capsule (40 mg total) by mouth 2 (two) times daily. 180 capsule 0   ondansetron (ZOFRAN-ODT) 4 MG disintegrating tablet Take 1 tablet (4 mg total) by mouth every 8 (eight) hours as needed for nausea or vomiting. 30 tablet 0   potassium chloride (KLOR-CON) 10 MEQ tablet Take 1 tablet (10 mEq total) by mouth daily. 90 tablet 0   triamcinolone cream (KENALOG) 0.1 % Apply 1 application topically 2 (two) times daily. 30 g 0   No current facility-administered medications for this visit.    No Known Allergies  Family History  Problem Relation Age of Onset   Hypertension Mother        alive   Arthritis Mother    Lung cancer Father        deceased   Heart disease Father    Other Other        1 brother and 1 sister both alive an healthy   Hypertension Maternal Grandmother     Social History   Socioeconomic History   Marital status: Widowed    Spouse name: Not on file  Number of children: 3   Years of education: Not on file   Highest education level: Not on file  Occupational History   Occupation: customer service    Employer: [employer]  Tobacco Use   Smoking status: Former    Types: Cigarettes    Quit date: 04/20/1985    Years since quitting: 36.4   Smokeless tobacco: Never   Tobacco comments:    remote tobacco use   Vaping Use   Vaping Use: Never used  Substance and Sexual Activity   Alcohol use: No    Alcohol/week: 0.0 standard drinks   Drug use: No   Sexual activity: Not Currently  Other Topics Concern   Not on file  Social History Narrative   Not on file   Social Determinants of Health   Financial Resource Strain: Not on file  Food Insecurity: Not on file  Transportation Needs: Not on file  Physical Activity: Not on file  Stress: Not on file  Social Connections: Not on  file  Intimate Partner Violence: Not on file     Constitutional: Denies fever, malaise, fatigue, headache or abrupt weight changes.  HEENT: Denies eye pain, eye redness, ear pain, ringing in the ears, wax buildup, runny nose, nasal congestion, bloody nose, or sore throat. Respiratory: Denies difficulty breathing, shortness of breath, cough or sputum production.   Cardiovascular: Denies chest pain, chest tightness, palpitations or swelling in the hands or feet.  Gastrointestinal: Patient reports intermittent constipation.  Denies abdominal pain, bloating, diarrhea or blood in the stool.  GU: Denies urgency, frequency, pain with urination, burning sensation, blood in urine, odor or discharge. Musculoskeletal: Patient reports intermittent low back pain.  Denies decrease in range of motion, difficulty with gait, or joint swelling.  Skin: Denies redness, rashes, lesions or ulcercations.  Neurological: Denies dizziness, difficulty with memory, difficulty with speech or problems with balance and coordination.  Psych: Patient has a history of anxiety and depression.  Denies SI/HI.  No other specific complaints in a complete review of systems (except as listed in HPI above).  Objective:   Physical Exam   LMP 01/20/2016  Wt Readings from Last 3 Encounters:  08/06/21 218 lb (98.9 kg)  07/28/21 220 lb 2 oz (99.8 kg)  07/03/21 212 lb (96.2 kg)    General: Appears their stated age, well developed, well nourished in NAD. Skin: Warm, dry and intact. No rashes, lesions or ulcerations noted. HEENT: Head: normal shape and size; Eyes: sclera white, no icterus, conjunctiva pink, PERRLA and EOMs intact; Ears: Tm's gray and intact, normal light reflex; Nose: mucosa pink and moist, septum midline; Throat/Mouth: Teeth present, mucosa pink and moist, no exudate, lesions or ulcerations noted.  Neck:  Neck supple, trachea midline. No masses, lumps or thyromegaly present.  Cardiovascular: Normal rate and rhythm.  S1,S2 noted.  No murmur, rubs or gallops noted. No JVD or BLE edema. No carotid bruits noted. Pulmonary/Chest: Normal effort and positive vesicular breath sounds. No respiratory distress. No wheezes, rales or ronchi noted.  Abdomen: Soft and nontender. Normal bowel sounds. No distention or masses noted. Liver, spleen and kidneys non palpable. Musculoskeletal: Normal range of motion. No signs of joint swelling. No difficulty with gait.  Neurological: Alert and oriented. Cranial nerves II-XII grossly intact. Coordination normal.  Psychiatric: Mood and affect normal. Behavior is normal. Judgment and thought content normal.   EKG:  BMET    Component Value Date/Time   NA 141 07/02/2021 2000   K 3.2 (L) 07/02/2021 2000  CL 100 07/02/2021 2000   CO2 30 07/02/2021 2000   GLUCOSE 109 (H) 07/02/2021 2000   BUN 14 07/02/2021 2000   CREATININE 0.64 07/02/2021 2000   CREATININE 0.66 05/14/2021 1030   CALCIUM 9.3 07/02/2021 2000   GFRNONAA >60 07/02/2021 2000   GFRAA >60 07/27/2017 2153    Lipid Panel     Component Value Date/Time   CHOL 189 07/28/2021 1553   TRIG 120 07/28/2021 1553   HDL 27 (L) 02/18/2021 1016   CHOLHDL 6.3 (H) 02/18/2021 1016   VLDL 21.0 02/09/2019 1245   LDLCALC 128 (H) 02/18/2021 1016    CBC    Component Value Date/Time   WBC 3.9 (L) 07/02/2021 2000   RBC 4.73 07/02/2021 2000   HGB 14.1 07/02/2021 2000   HCT 41.5 07/02/2021 2000   PLT 219 07/02/2021 2000   MCV 87.7 07/02/2021 2000   MCH 29.8 07/02/2021 2000   MCHC 34.0 07/02/2021 2000   RDW 13.5 07/02/2021 2000   LYMPHSABS 1.1 07/28/2016 1232   MONOABS 0.4 07/28/2016 1232   EOSABS 0.0 07/28/2016 1232   BASOSABS 0.0 07/28/2016 1232    Hgb A1C Lab Results  Component Value Date   HGBA1C 5.9 (H) 02/18/2021           Assessment & Plan:     [name], NP

## 2021-09-22 ENCOUNTER — Other Ambulatory Visit: Payer: Self-pay | Admitting: Internal Medicine

## 2021-09-22 DIAGNOSIS — K644 Residual hemorrhoidal skin tags: Secondary | ICD-10-CM

## 2021-09-23 NOTE — Telephone Encounter (Signed)
Requested medication (s) are due for refill today: yes  Requested medication (s) are on the active medication list: yes  Last refill:  08/19/21 30 grams  Future visit scheduled: no  Notes to clinic:  med not delegated to NT to RF   Requested Prescriptions  Pending Prescriptions Disp Refills   hydrocortisone (ANUSOL-HC) 2.5 % rectal cream [Pharmacy Med Name: HYDROCORTISONE 2.5% CREAM 2.5 Cream] 30 g 0    Sig: PLACE 1 APPLICATION RECTALLY 2 TIMES DAILY.     Not Delegated - Over the Counter: OTC 2 Failed - 09/22/2021 11:45 AM      Failed - This refill cannot be delegated      Passed - Valid encounter within last 12 months    Recent Outpatient Visits           1 month ago Pelvic pain   [facility] [place], Salv[place]ore [place], NP   1 month ago Suprapubic pain   [facility] [place], [place], NP   2 months ago Acute bilateral thoracic back pain   [facility] [place], Salvadore [place], NP   4 months ago Upper back pain   [facility] [place], Salvadore [place], NP   4 months ago Hypokalemia   [facility] [place], Salvadore [place], [place]

## 2021-10-01 DIAGNOSIS — L300 Nummular dermatitis: Secondary | ICD-10-CM | POA: Diagnosis not present

## 2021-10-01 DIAGNOSIS — B081 Molluscum contagiosum: Secondary | ICD-10-CM | POA: Diagnosis not present

## 2021-10-15 ENCOUNTER — Ambulatory Visit: Payer: Self-pay

## 2021-10-15 NOTE — Telephone Encounter (Signed)
  Chief Complaint: tick bite  Symptoms: red spots around bite Frequency: today Pertinent Negatives: Patient denies fever, joint pain, muscle aches Disposition: [] ED /[] Urgent Care (no appt availability in office) / [x] Appointment(In office/virtual)/ []  [facility]/ [] Home Care/ [] Refused Recommended Disposition /[] [facility]/ []  Follow-up with PCP Additional Notes: pt requesting appt- Appt made for tomorrow with PCP.  Reason for Disposition  [1] 2 to 14 days following tick bite AND [2] widespread rash or headache AND [3] no fever  Answer Assessment - Initial Assessment Questions 1. TYPE of TICK: "Is it a wood tick or a deer tick?" (e.g., deer tick, wood tick; unsure)     unsure 2. SIZE of TICK: "How big is the tick?" (e.g., size of poppy seed, apple seed, watermelon seed; unsure) Note: Deer ticks can be the size of a poppy seed (nymph) or an apple seed (adult).       unsure 3. ENGORGED: "Did the tick look flat or engorged (full, swollen)?" (e.g., flat, engorged; unsure)     unsure 4. LOCATION: "Where is the tick bite located?"      Back of neck 5. ONSET: "How long do you think the tick was attached before you removed it?" (e.g., 5 hours, 2 days)      unsure 6. APPEARANCE of BITE or RASH: "What does the site look like?"     Red at bite and red areas around it  Protocols used: Tick Bite-A-AH

## 2021-10-16 ENCOUNTER — Ambulatory Visit (INDEPENDENT_AMBULATORY_CARE_PROVIDER_SITE_OTHER): Payer: BC Managed Care – PPO | Admitting: Internal Medicine

## 2021-10-16 ENCOUNTER — Encounter: Payer: Self-pay | Admitting: Internal Medicine

## 2021-10-16 VITALS — BP 136/88 | HR 68 | Temp 97.1°F | Wt 218.0 lb

## 2021-10-16 DIAGNOSIS — W57XXXA Bitten or stung by nonvenomous insect and other nonvenomous arthropods, initial encounter: Secondary | ICD-10-CM

## 2021-10-16 DIAGNOSIS — S1096XA Insect bite of unspecified part of neck, initial encounter: Secondary | ICD-10-CM

## 2021-10-16 MED ORDER — DOXYCYCLINE HYCLATE 100 MG PO TABS: 100.0000 mg | ORAL_TABLET | Freq: Two times a day (BID) | ORAL | 0 refills | Status: DC

## 2021-10-16 NOTE — Patient Instructions (Signed)
Tick Bite Information, Adult  Ticks are insects that can bite. Most ticks live in shrubs and grassy areas. They climb onto people and animals that go by. Then they bite. Some ticks carry germs that can make you sick. How can I prevent tick bites? Take these steps: Use insect repellent Use an insect repellent that has 20% or higher of the ingredients DEET, picaridin, or IR3535. Follow the instructions on the label. Put it on: Bare skin. The tops of your boots. Your pant legs. The ends of your sleeves. If you use an insect repellent that has the ingredient permethrin, follow the instructions on the label. Put it on: Clothing. Boots. Supplies or outdoor gear. Tents. When you are outside Wear long sleeves and long pants. Wear light-colored clothes. Tuck your pant legs into your socks. Stay in the middle of the trail. Do not touch the bushes. Avoid walking through long grass. Check for ticks on your clothes, hair, and skin often while you are outside. Before going inside your house, check your clothes, skin, head, neck, armpits, waist, groin, and joint areas. When you go indoors Check your clothes for ticks. Dry your clothes in a dryer on high heat for 10 minutes or more. If clothes are damp, additional time may be needed. Wash your clothes right away if they need to be washed. Use hot water. Check your pets and outdoor gear. Shower right away. Check your body for ticks. Do a full body check using a mirror. What is the right way to remove a tick? Remove the tick from your skin as soon as possible. Do not remove the tick with your bare fingers. To remove a tick that is crawling on your skin: Go outdoors and brush the tick off. Use tape or a lint roller. To remove a tick that is biting: Wash your hands. If you have latex gloves, put them on. Use tweezers, curved forceps, or a tick-removal tool to grasp the tick. Grasp the tick as close to your skin and as close to the tick's head as  possible. Gently pull up until the tick lets go. Try to keep the tick's head attached to its body. Do not twist or jerk the tick. Do not squeeze or crush the tick. Do not try to remove a tick with heat, alcohol, petroleum jelly, or fingernail polish. What should I do after taking out a tick? Throw away the tick. Do not crush a tick with your fingers. Clean the bite area and your hands with soap and water, rubbing alcohol, or an iodine wash. If an antiseptic cream or ointment is available, apply a small amount to the bite area. Wash and disinfect any instruments that you used to remove the tick. How should I get rid of a live tick? To dispose of a live tick, use one of these methods: Place the tick in rubbing alcohol. Place the tick in a bag or container you can close tightly. Wrap the tick tightly in tape. Flush the tick down the toilet. Contact a doctor if: You have symptoms, such as: A fever or chills. A red rash that makes a circle (bull's-eye rash) in the bite area. Redness and swelling where the tick bit you. Headache. Pain in a muscle, joint, or bone. Being more tired than normal. Trouble walking or moving your legs. Numbness in your legs. Tender and swollen lymph glands. A part of a tick breaks off and gets stuck in your skin. Get help right away if: You cannot remove   a tick. You cannot move (have paralysis) or feel weak. You are feeling worse or have new symptoms. You find a tick that is biting you and filled with blood. This is important if you are in an area where diseases from ticks are common. Summary Ticks may carry germs that can make you sick. To prevent tick bites wear long sleeves, long pants, and light colors. Use insect repellent. Follow the instructions on the label. If the tick is biting, do not try to remove it with heat, alcohol, petroleum jelly, or fingernail polish. Use tweezers, curved forceps, or a tick-removal tool to grasp the tick. Gently pull up  until the tick lets go. Do not twist or jerk the tick. Do not squeeze or crush the tick. If you have symptoms, contact a doctor. This information is not intended to replace advice given to you by your health care provider. Make sure you discuss any questions you have with your health care provider. Document Revised: 04/03/2019 Document Reviewed: 04/03/2019 Elsevier Patient Education  2023 Elsevier Inc.  

## 2021-10-16 NOTE — Progress Notes (Signed)
Subjective:    Patient ID: Diana [name], female    DOB: 1960/07/21, 61 y.o.   MRN: [id number]  HPI  Patient presents to clinic today with complaint of a tick bite to the back of her neck.  She pulled the tick off of her neck yesterday.  She has noticed red spots around this area.  She has no idea how long the tick was on her neck or if it was embedded.  She denies headaches, dizziness, vision changes, nausea, joint pain or diarrhea.  She has not tried anything OTC for this.  Review of Systems     Past Medical History:  Diagnosis Date   Allergy    Ear infection    recurrent    Current Outpatient Medications  Medication Sig Dispense Refill   Ascorbic Acid 125 MG CHEW Chew 1 each by mouth daily.     cetirizine (ZYRTEC) 10 MG tablet Take 10 mg by mouth daily as needed.     Cholecalciferol (VITAMIN D3) 25 MCG (1000 UT) CAPS Take 5,000 Units by mouth.      clonazePAM (KLONOPIN) 0.5 MG tablet Take 1 tablet (0.5 mg total) by mouth daily as needed. for anxiety 30 tablet 0   hydrochlorothiazide (MICROZIDE) 12.5 MG capsule Take 1 capsule (12.5 mg total) by mouth daily. 90 capsule 0   hydrocortisone (ANUSOL-HC) 2.5 % rectal cream PLACE 1 APPLICATION RECTALLY 2 TIMES DAILY. 30 g 0   ibuprofen (ADVIL) 200 MG tablet Take 400-600 mg by mouth every 6 (six) hours as needed.     omeprazole (PRILOSEC) 40 MG capsule Take 1 capsule (40 mg total) by mouth 2 (two) times daily. 180 capsule 0   ondansetron (ZOFRAN-ODT) 4 MG disintegrating tablet Take 1 tablet (4 mg total) by mouth every 8 (eight) hours as needed for nausea or vomiting. 30 tablet 0   potassium chloride (KLOR-CON) 10 MEQ tablet Take 1 tablet (10 mEq total) by mouth daily. 90 tablet 0   triamcinolone cream (KENALOG) 0.1 % Apply 1 application topically 2 (two) times daily. 30 g 0   No current facility-administered medications for this visit.    No Known Allergies  Family History  Problem Relation Age of Onset   Hypertension Mother         alive   Arthritis Mother    Lung cancer Father        deceased   Heart disease Father    Other Other        1 brother and 1 sister both alive an healthy   Hypertension Maternal Grandmother     Social History   Socioeconomic History   Marital status: Widowed    Spouse name: Not on file   Number of children: 3   Years of education: Not on file   Highest education level: Not on file  Occupational History   Occupation: customer service    Employer: [employer]  Tobacco Use   Smoking status: Former    Types: Cigarettes    Quit date: 04/20/1985    Years since quitting: 36.5   Smokeless tobacco: Never   Tobacco comments:    remote tobacco use   Vaping Use   Vaping Use: Never used  Substance and Sexual Activity   Alcohol use: No    Alcohol/week: 0.0 standard drinks of alcohol   Drug use: No   Sexual activity: Not Currently  Other Topics Concern   Not on file  Social History Narrative   Not on  file   Social Determinants of Health   Financial Resource Strain: Not on file  Food Insecurity: Not on file  Transportation Needs: Not on file  Physical Activity: Not on file  Stress: Not on file  Social Connections: Not on file  Intimate Partner Violence: Not on file     Constitutional: Denies fever, malaise, fatigue, headache or abrupt weight changes.  Respiratory: Denies difficulty breathing, shortness of breath, cough or sputum production.   Cardiovascular: Denies chest pain, chest tightness, palpitations or swelling in the hands or feet.  Gastrointestinal: Denies abdominal pain, bloating, constipation, diarrhea or blood in the stool.  Musculoskeletal: Denies decrease in range of motion, difficulty with gait, muscle pain or joint pain and swelling.  Skin: Patient reports tick bite to back of neck.  Denies ulcercations.  Neurological: Denies dizziness, difficulty with memory, difficulty with speech or problems with balance and coordination.   No other specific complaints  in a complete review of systems (except as listed in HPI above).  Objective:   Physical Exam BP 136/88 (BP Location: Left Arm, Patient Position: Sitting, Cuff Size: Large)   Pulse 68   Temp (!) 97.1 F (36.2 C) (Temporal)   Wt 218 lb (98.9 kg)   LMP 01/20/2016   SpO2 99%   BMI 34.14 kg/m   Wt Readings from Last 3 Encounters:  08/06/21 218 lb (98.9 kg)  07/28/21 220 lb 2 oz (99.8 kg)  07/03/21 212 lb (96.2 kg)    General: Appears her stated age, well developed, well nourished in NAD. Skin: Nodule noted to midline posterior neck with some redness that extends upward into the scalp. Cardiovascular: Normal rate. Pulmonary/Chest: Normal effort. Neurological: Alert and oriented.  BMET    Component Value Date/Time   NA 141 07/02/2021 2000   K 3.2 (L) 07/02/2021 2000   CL 100 07/02/2021 2000   CO2 30 07/02/2021 2000   GLUCOSE 109 (H) 07/02/2021 2000   BUN 14 07/02/2021 2000   CREATININE 0.64 07/02/2021 2000   CREATININE 0.66 05/14/2021 1030   CALCIUM 9.3 07/02/2021 2000   GFRNONAA >60 07/02/2021 2000   GFRAA >60 07/27/2017 2153    Lipid Panel     Component Value Date/Time   CHOL 189 07/28/2021 1553   TRIG 120 07/28/2021 1553   HDL 27 (L) 02/18/2021 1016   CHOLHDL 6.3 (H) 02/18/2021 1016   VLDL 21.0 02/09/2019 1245   LDLCALC 128 (H) 02/18/2021 1016    CBC    Component Value Date/Time   WBC 3.9 (L) 07/02/2021 2000   RBC 4.73 07/02/2021 2000   HGB 14.1 07/02/2021 2000   HCT 41.5 07/02/2021 2000   PLT 219 07/02/2021 2000   MCV 87.7 07/02/2021 2000   MCH 29.8 07/02/2021 2000   MCHC 34.0 07/02/2021 2000   RDW 13.5 07/02/2021 2000   LYMPHSABS 1.1 07/28/2016 1232   MONOABS 0.4 07/28/2016 1232   EOSABS 0.0 07/28/2016 1232   BASOSABS 0.0 07/28/2016 1232    Hgb A1C Lab Results  Component Value Date   HGBA1C 5.9 (H) 02/18/2021            Assessment & Plan:   Tick Bite of Neck:  Does not appear infected but she has some redness that could be concerning  for erythema migrans Rx for Doxycycline 100 mg twice daily x7 days Can use Hydrocortisone OTC as needed for itching  Schedule an appointment for follow-up of chronic conditions.  [name], NP

## 2021-10-17 ENCOUNTER — Institutional Professional Consult (permissible substitution): Payer: BC Managed Care – PPO | Admitting: Primary Care

## 2021-10-17 NOTE — Progress Notes (Deleted)
@Patient  ID: , female    DOB: 06-28-1960, 61 y.o.   MRN: [id number]  No chief complaint on file.   Referring provider: [id number], NP  HPI: 61 year old female, former smoker quit 1987.  Past medical history significant for hypertension, GERD, obesity, anxiety and depression.  10/17/2021 Patient presents today for sleep consult.   Sleep questionnaire Symptoms Prior sleep study Bedtime Time to fall asleep Nocturnal awakenings Start of day Weight changes Do you operate heavy machinery Current CPAP use Epworth score      No Known Allergies  Immunization History  Administered Date(s) Administered   Tdap 04/16/2015    Past Medical History:  Diagnosis Date   Allergy    Ear infection    recurrent    Tobacco History: Social History   Tobacco Use  Smoking Status Former   Types: Cigarettes   Quit date: 04/20/1985   Years since quitting: 36.5  Smokeless Tobacco Never  Tobacco Comments   remote tobacco use    Counseling given: Not Answered Tobacco comments: remote tobacco use    Outpatient Medications Prior to Visit  Medication Sig Dispense Refill   Ascorbic Acid 125 MG CHEW Chew 1 each by mouth daily.     cetirizine (ZYRTEC) 10 MG tablet Take 10 mg by mouth daily as needed.     Cholecalciferol (VITAMIN D3) 25 MCG (1000 UT) CAPS Take 5,000 Units by mouth.      clonazePAM (KLONOPIN) 0.5 MG tablet Take 1 tablet (0.5 mg total) by mouth daily as needed. for anxiety 30 tablet 0   doxycycline (VIBRA-TABS) 100 MG tablet Take 1 tablet (100 mg total) by mouth 2 (two) times daily. 14 tablet 0   hydrochlorothiazide (MICROZIDE) 12.5 MG capsule Take 1 capsule (12.5 mg total) by mouth daily. 90 capsule 0   hydrocortisone (ANUSOL-HC) 2.5 % rectal cream PLACE 1 APPLICATION RECTALLY 2 TIMES DAILY. 30 g 0   ibuprofen (ADVIL) 200 MG tablet Take 400-600 mg by mouth every 6 (six) hours as needed.     omeprazole (PRILOSEC) 40 MG capsule Take 1 capsule (40 mg  total) by mouth 2 (two) times daily. 180 capsule 0   ondansetron (ZOFRAN-ODT) 4 MG disintegrating tablet Take 1 tablet (4 mg total) by mouth every 8 (eight) hours as needed for nausea or vomiting. 30 tablet 0   potassium chloride (KLOR-CON) 10 MEQ tablet Take 1 tablet (10 mEq total) by mouth daily. 90 tablet 0   triamcinolone cream (KENALOG) 0.1 % Apply 1 application topically 2 (two) times daily. 30 g 0   No facility-administered medications prior to visit.      Review of Systems  Review of Systems   Physical Exam  LMP 01/20/2016  Physical Exam   Lab Results:  CBC    Component Value Date/Time   WBC 3.9 (L) 07/02/2021 2000   RBC 4.73 07/02/2021 2000   HGB 14.1 07/02/2021 2000   HCT 41.5 07/02/2021 2000   PLT 219 07/02/2021 2000   MCV 87.7 07/02/2021 2000   MCH 29.8 07/02/2021 2000   MCHC 34.0 07/02/2021 2000   RDW 13.5 07/02/2021 2000   LYMPHSABS 1.1 07/28/2016 1232   MONOABS 0.4 07/28/2016 1232   EOSABS 0.0 07/28/2016 1232   BASOSABS 0.0 07/28/2016 1232    BMET    Component Value Date/Time   NA 141 07/02/2021 2000   K 3.2 (L) 07/02/2021 2000   CL 100 07/02/2021 2000   CO2 30 07/02/2021 2000   GLUCOSE 109 (H)  07/02/2021 2000   BUN 14 07/02/2021 2000   CREATININE 0.64 07/02/2021 2000   CREATININE 0.66 05/14/2021 1030   CALCIUM 9.3 07/02/2021 2000   GFRNONAA >60 07/02/2021 2000   GFRAA >60 07/27/2017 2153    BNP No results found for: "BNP"  ProBNP No results found for: "PROBNP"  Imaging: No results found.   Assessment & Plan:   No problem-specific Assessment & Plan notes found for this encounter.     Glenford Bayley, NP 10/17/2021

## 2021-10-24 ENCOUNTER — Other Ambulatory Visit: Payer: Self-pay | Admitting: Internal Medicine

## 2021-10-24 ENCOUNTER — Ambulatory Visit: Payer: Self-pay

## 2021-10-24 DIAGNOSIS — K644 Residual hemorrhoidal skin tags: Secondary | ICD-10-CM

## 2021-10-24 DIAGNOSIS — B079 Viral wart, unspecified: Secondary | ICD-10-CM | POA: Diagnosis not present

## 2021-10-24 MED ORDER — HYDROCORTISONE (PERIANAL) 2.5 % EX CREA: TOPICAL_CREAM | CUTANEOUS | 0 refills | Status: DC

## 2021-10-24 NOTE — Addendum Note (Signed)
Addended by: Kavin Leech E on: 10/24/2021 03:50 PM   Modules accepted: Orders

## 2021-10-24 NOTE — Telephone Encounter (Signed)
This was refilled earlier.

## 2021-10-24 NOTE — Telephone Encounter (Signed)
  Chief Complaint: rectal bleeding Symptoms: per pt symptoms are chronic and not new: constipation Frequency: 1 week Pertinent Negatives: Patient denies dizziness Disposition: [] ED /[] Urgent Care (no appt availability in office) / [] Appointment(In office/virtual)/ []  [facility]/ [] Home Care/ [x] Refused Recommended Disposition /[] [place] [facility]/ []  Follow-up with PCP Additional Notes: pt has appt with GI doctor Monday 10/27/21- pt requested rectal cream refill. Pt refused appt with PCP since appt on Monday  Reason for Disposition  MODERATE rectal bleeding (small blood clots, passing blood without stool, or toilet water turns red)  Answer Assessment - Initial Assessment Questions 1. APPEARANCE of BLOOD: "What color is it?" "Is it passed separately, on the surface of the stool, or mixed in with the stool?"      Bright red- water is pink 2. AMOUNT: "How much blood was passed?"      Pink toilet water 3. FREQUENCY: "How many times has blood been passed with the stools?"      Last time was last night 4. ONSET: "When was the blood first seen in the stools?" (Days or weeks)      1 week  5. DIARRHEA: "Is there also some diarrhea?" If Yes, ask: "How many diarrhea stools in the past 24 hours?"      no 6. CONSTIPATION: "Do you have constipation?" If Yes, ask: "How bad is it?"     Yes- has to strain 7. RECURRENT SYMPTOMS: "Have you had blood in your stools before?" If Yes, ask: "When was the last time?" and "What happened that time?"      Yes 20 years ago doesn't remember tx 8. BLOOD THINNERS: "Do you take any blood thinners?" (e.g., Coumadin/warfarin, Pradaxa/dabigatran, aspirin)     no 9. OTHER SYMPTOMS: "Do you have any other symptoms?"  (e.g., abdomen pain, vomiting, dizziness, fever)     Stomach dx IBS, bloating and gassy pain  Protocols used: Rectal Bleeding-A-AH

## 2021-10-24 NOTE — Telephone Encounter (Signed)
Medication Refill - Medication: hydrocortisone (ANUSOL-HC) 2.5 % rectal cream  Patient is out of this med, trying to get right away Has the patient contacted their pharmacy? No, she thought she could get this faster over the phone (Agent: If no, request that the patient contact the pharmacy for the refill. If patient does not wish to contact the pharmacy document the reason why and proceed with request.) (Agent: If yes, when and what did the pharmacy advise?)  Preferred Pharmacy (with phone number or street name):  [facility] - [place], [place] - [address] Phone:  [phone]  Fax:  [phone]     Has the patient been seen for an appointment in the last year OR does the patient have an upcoming appointment? yes  Agent: Please be advised that RX refills may take up to 3 business days. We ask that you follow-up with your pharmacy.

## 2021-10-24 NOTE — Telephone Encounter (Signed)
Requested medication (s) are due for refill today:   Yes  Requested medication (s) are on the active medication list:   Yes  Future visit scheduled:   Yes   Last ordered: 09/23/2021 #30, 0 refills  Returned because no protocol assigned to this medication   Requested Prescriptions  Pending Prescriptions Disp Refills   hydrocortisone (ANUSOL-HC) 2.5 % rectal cream 30 g 0     Off-Protocol Failed - 10/24/2021 12:23 PM      Failed - Medication not assigned to a protocol, review manually.      Passed - Valid encounter within last 12 months    Recent Outpatient Visits           1 week ago Tick bite of neck, initial encounter   [facility] [place], Salvadore Oxford, NP   2 months ago Pelvic pain   [facility] [place], Salvadore Oxford, NP   2 months ago Suprapubic pain   [facility] [place], [place] W, NP   3 months ago Acute bilateral thoracic back pain   [facility] [place], Salvadore Oxford, NP   5 months ago Upper back pain   [facility] [place], Salvadore Oxford, NP       Future Appointments             In 3 days Vanga, Loel Dubonnet, MD [facility] [place]   In 2 weeks Sampson Si, Salvadore Oxford, NP [facility], [facility]           Not Delegated - Over the Counter: OTC 2 Failed - 10/24/2021 12:23 PM      Failed - This refill cannot be delegated      Passed - Valid encounter within last 12 months    Recent Outpatient Visits           1 week ago Tick bite of neck, initial encounter   [facility] [place], Salvadore Oxford, NP   2 months ago Pelvic pain   [facility] [place], Salvadore Oxford, NP   2 months ago Suprapubic pain   [facility] [place], [place], NP   3 months ago Acute bilateral thoracic back pain   [facility] [place], Salvadore Oxford, NP   5 months ago Upper back pain   [facility] [place], Salvadore Oxford, NP       Future Appointments             In 3 days Vanga,  Loel Dubonnet, MD [facility] [place]   In 2 weeks [place], Salvadore Oxford, NP [facility], [facility]

## 2021-10-27 ENCOUNTER — Encounter: Payer: Self-pay | Admitting: Gastroenterology

## 2021-10-27 ENCOUNTER — Other Ambulatory Visit: Payer: Self-pay

## 2021-10-27 ENCOUNTER — Ambulatory Visit (INDEPENDENT_AMBULATORY_CARE_PROVIDER_SITE_OTHER): Payer: BC Managed Care – PPO | Admitting: Gastroenterology

## 2021-10-27 VITALS — BP 133/84 | HR 81 | Temp 98.7°F | Ht 67.0 in | Wt 213.2 lb

## 2021-10-27 DIAGNOSIS — K625 Hemorrhage of anus and rectum: Secondary | ICD-10-CM | POA: Diagnosis not present

## 2021-10-27 MED ORDER — NA SULFATE-K SULFATE-MG SULF 17.5-3.13-1.6 GM/177ML PO SOLN
354.0000 mL | Freq: Once | ORAL | 0 refills | Status: AC
Start: 2021-10-27 — End: 2021-10-27

## 2021-10-27 MED ORDER — ONDANSETRON HCL 4 MG PO TABS: 4.0000 mg | ORAL_TABLET | Freq: Three times a day (TID) | ORAL | 0 refills | Status: DC | PRN

## 2021-10-27 NOTE — Progress Notes (Signed)
Diana Repress, MD [address]  [address]  [place], [place] [address]  Main: [phone]  Fax: [phone]    Gastroenterology Consultation  Referring Provider:     [name], NP Primary Care Physician:  [name], NP Primary Gastroenterologist:  Dr. Arlyss [name] Reason for Consultation: Painless rectal bleeding       HPI:   Diana [name] is a 61 y.o. female referred by Dr. Sampson Si, Salvadore Oxford, NP  for consultation & management of chronic hepatitis B infection.[name] has history of hepatitis B infection, treated with entecavir with no detection of active infection for last 2 years, E antigen negative, E antibody positive.   And, viral load is undetectable most recently in 11/2019. She does have fatty liver. She denies any GI symptoms today. LFTs are normal except for indirect hyperbilirubinemia.  Patient did not show for her screening colonoscopy last year.  She reports intermittent constipation.  She does have flareup of hemorrhoids, mostly swelling and discomfort.  She has been gaining weight  Follow-up visit 07/28/21 Patient made a follow-up appointment when she had flareup of GERD symptoms about 3 months ago.  Her PCP increased omeprazole 40 mg to twice daily and apparently patient reports that it did not help.  Currently, her GERD symptoms have subsided And she is on omeprazole 40 mg once a day.  She continues to gain weight, does admit to drinking sweet tea daily.  Patient did not undergo ultrasound liver as she was not able to afford co-pay.  She did not undergo colonoscopy as well  She is also overdue for colon cancer screening  Follow-up visit 10/28/2021 Patient made an appointment to discuss about 2 weeks history of painless rectal bleeding.  Patient reports that she has been passing bright red blood per rectum, dripping into the toilet bowl as well as on wiping.  Her bowel movements tend to be on the harder side associated with some straining.  She does  have bowel movement on a daily basis.  Patient is past due for her colonoscopy.  She would like to know if this is secondary to hemorrhoids.  Patient has not tried any over-the-counter hemorrhoidal remedies.  NSAIDs: she is no longer taking NSAIDs  Antiplts/Anticoagulants/Anti thrombotics: none  GI Procedures: none She denies family history of colon cancer  Ultrasound-guided liver biopsy 07/27/2017 DIAGNOSIS:  A. LIVER; ULTRASOUND-GUIDED CORE BIOPSY:  - HEPATITIC PROCESS WITH MILD TO MODERATE PORTAL AND LOBULAR  INFLAMMATION, CONSISTENT WITH VIRAL HEPATITIS B, SEE COMMENT.  - NEGATIVE FOR FIBROSIS.  - NEGATIVE FOR STEATOSIS.   Past Medical History:  Diagnosis Date   Allergy    Ear infection    recurrent    Past Surgical History:  Procedure Laterality Date   ABDOMINAL HYSTERECTOMY  2019   partial   TONSILLECTOMY     TUBAL LIGATION     bilateral     Current Outpatient Medications:    Ascorbic Acid 125 MG CHEW, Chew 1 each by mouth daily., Disp: , Rfl:    cetirizine (ZYRTEC) 10 MG tablet, Take 10 mg by mouth daily as needed., Disp: , Rfl:    Cholecalciferol (VITAMIN D3) 25 MCG (1000 UT) CAPS, Take 5,000 Units by mouth. , Disp: , Rfl:    clonazePAM (KLONOPIN) 0.5 MG tablet, Take 1 tablet (0.5 mg total) by mouth daily as needed. for anxiety, Disp: 30 tablet, Rfl: 0   hydrochlorothiazide (MICROZIDE) 12.5 MG capsule, Take 1 capsule (12.5 mg total) by  mouth daily., Disp: 90 capsule, Rfl: 0   hydrocortisone (ANUSOL-HC) 2.5 % rectal cream, PLACE 1 APPLICATION RECTALLY 2 TIMES DAILY., Disp: 30 g, Rfl: 0   ibuprofen (ADVIL) 200 MG tablet, Take 400-600 mg by mouth every 6 (six) hours as needed., Disp: , Rfl:    omeprazole (PRILOSEC) 40 MG capsule, Take 1 capsule (40 mg total) by mouth 2 (two) times daily., Disp: 180 capsule, Rfl: 0   triamcinolone cream (KENALOG) 0.1 %, Apply 1 application topically 2 (two) times daily., Disp: 30 g, Rfl: 0   Family History  Problem Relation Age of  Onset   Hypertension Mother        alive   Arthritis Mother    Lung cancer Father        deceased   Heart disease Father    Other Other        1 brother and 1 sister both alive an healthy   Hypertension Maternal Grandmother      Social History   Tobacco Use   Smoking status: Former    Types: Cigarettes    Quit date: 04/20/1985    Years since quitting: 36.5   Smokeless tobacco: Never   Tobacco comments:    remote tobacco use   Vaping Use   Vaping Use: Never used  Substance Use Topics   Alcohol use: No    Alcohol/week: 0.0 standard drinks of alcohol   Drug use: No    Allergies as of 10/27/2021   (No Known Allergies)    Review of Systems:    All systems reviewed and negative except where noted in HPI.   Physical Exam:  BP 133/84 (BP Location: Left Arm, Patient Position: Sitting, Cuff Size: Normal)   Pulse 81   Temp 98.7 F (37.1 C) (Oral)   Ht 5\' 7"  (1.702 m)   Wt 213 lb 4 oz (96.7 kg)   LMP 01/20/2016   BMI 33.40 kg/m  Patient's last menstrual period was 01/20/2016.  General:   Alert,  Well-developed, well-nourished, pleasant and cooperative in NAD Head:  Normocephalic and atraumatic. Eyes:  Sclera clear, no icterus.   Conjunctiva pink. Ears:  Normal auditory acuity. Nose:  No deformity, discharge, or lesions. Mouth:  No deformity or lesions,oropharynx pink & moist. Neck:  Supple; no masses or thyromegaly. Lungs:  Respirations even and unlabored.  Clear throughout to auscultation.   No wheezes, crackles, or rhonchi. No acute distress. Heart:  Regular rate and rhythm; no murmurs, clicks, rubs, or gallops. Abdomen:  Normal bowel sounds. Soft, obese, non-tender and non-distended without masses, hepatosplenomegaly or hernias noted.  No guarding or rebound tenderness.   Rectal: Not performed Msk:  Symmetrical without gross deformities. Good, equal movement & strength bilaterally. Pulses:  Normal pulses noted. Extremities:  No clubbing or edema.  No  cyanosis. Neurologic:  Alert and oriented x3;  grossly normal neurologically. Skin:  Intact without significant lesions or rashes. No jaundice. Psych:  Alert and cooperative. Normal mood and affect.  Imaging Studies: Last ultrasound abdomen in 05/2015 which was unremarkable  Ultrasound abdomen 06/2017 unremarkable with no evidence of cirrhosis and no portal hypertension  Assessment and Plan:   SHAIDA ROUTE is a 61 y.o. female with obesity, hypertension, GERD is here for follow-up of chronic hepatitis B infection. Her hepatitis D antigen is negative. She does not have clinical signs or symptoms to suggest chronic liver disease. There is no evidence of portal hypertension. Secondary liver disease workup revealed weakly positive anti-smooth muscle antibodies. She  does not have hep C. Status post liver biopsy.  Patient did not tolerate viread, developed stomach upset and myalgias.  Therefore, treated with entecavir which was stopped in 07/2019.  Patient made an urgent visit to discuss about painless bright red blood per rectum  Painless rectal bleeding Strongly recommended patient to undergo colonoscopy to evaluate for alternative etiology.  I have discussed with her regarding outpatient hemorrhoid ligation if colonoscopy is otherwise unremarkable Patient is agreeable to undergo diagnostic colonoscopy  I have discussed alternative options, risks & benefits,  which include, but are not limited to, bleeding, infection, perforation,respiratory complication & drug reaction.  The patient agrees with this plan & written consent will be obtained.     Chronic hepatitis B infection, currently seroconverted, E antigen negative, E antibody positive and HBV DNA undetected as of 1423 - Okay to hold off on antiviral therapy at this time - Recheck LFTs, HBV DNA every 6 months - Hepatitis B e antigen, hepatitis B e antibody every 6 months - HBsAg annually - to avoid alcohol use, limit intake of NSAIDs and  Tylenol use for arthralgias - Elita Boone FibroSure revealed F3 fibrosis, mild to moderate steatosis.  We will discuss more regarding fibrosis during next visit   Follow up based on the colonoscopy results   Diana Repress, MD

## 2021-10-28 ENCOUNTER — Encounter: Payer: Self-pay | Admitting: Gastroenterology

## 2021-11-10 ENCOUNTER — Ambulatory Visit: Payer: BC Managed Care – PPO | Admitting: Certified Registered Nurse Anesthetist

## 2021-11-10 ENCOUNTER — Ambulatory Visit: Payer: BC Managed Care – PPO | Admitting: Internal Medicine

## 2021-11-10 ENCOUNTER — Encounter: Payer: Self-pay | Admitting: Gastroenterology

## 2021-11-10 ENCOUNTER — Ambulatory Visit
Admission: RE | Admit: 2021-11-10 | Discharge: 2021-11-10 | Disposition: A | Discharge status: Home / Self Care | Payer: BC Managed Care – PPO | Source: Ambulatory Visit | Attending: Gastroenterology | Admitting: Gastroenterology

## 2021-11-10 ENCOUNTER — Encounter
Admission: RE | Disposition: A | Discharge status: Home / Self Care | Payer: Self-pay | Source: Ambulatory Visit | Attending: Gastroenterology

## 2021-11-10 DIAGNOSIS — Z87891 Personal history of nicotine dependence: Secondary | ICD-10-CM | POA: Insufficient documentation

## 2021-11-10 DIAGNOSIS — K644 Residual hemorrhoidal skin tags: Secondary | ICD-10-CM | POA: Diagnosis not present

## 2021-11-10 DIAGNOSIS — K648 Other hemorrhoids: Secondary | ICD-10-CM | POA: Diagnosis not present

## 2021-11-10 DIAGNOSIS — Z79899 Other long term (current) drug therapy: Secondary | ICD-10-CM | POA: Insufficient documentation

## 2021-11-10 DIAGNOSIS — K642 Third degree hemorrhoids: Secondary | ICD-10-CM | POA: Diagnosis not present

## 2021-11-10 DIAGNOSIS — F419 Anxiety disorder, unspecified: Secondary | ICD-10-CM | POA: Diagnosis not present

## 2021-11-10 DIAGNOSIS — I10 Essential (primary) hypertension: Secondary | ICD-10-CM | POA: Insufficient documentation

## 2021-11-10 DIAGNOSIS — K625 Hemorrhage of anus and rectum: Secondary | ICD-10-CM

## 2021-11-10 HISTORY — PX: COLONOSCOPY WITH PROPOFOL: SHX5780

## 2021-11-10 SURGERY — COLONOSCOPY WITH PROPOFOL
Anesthesia: General

## 2021-11-10 MED ORDER — PROPOFOL 10 MG/ML IV BOLUS
INTRAVENOUS | Status: DC | PRN
  Administered 2021-11-10: 20 mg via INTRAVENOUS
  Administered 2021-11-10: 80 mg via INTRAVENOUS

## 2021-11-10 MED ORDER — PROPOFOL 1000 MG/100ML IV EMUL
INTRAVENOUS | Status: AC
  Filled 2021-11-10: qty 100

## 2021-11-10 MED ORDER — LIDOCAINE HCL (CARDIAC) PF 100 MG/5ML IV SOSY
PREFILLED_SYRINGE | INTRAVENOUS | Status: DC | PRN
  Administered 2021-11-10: 80 mg via INTRAVENOUS

## 2021-11-10 MED ORDER — LIDOCAINE HCL (PF) 2 % IJ SOLN
INTRAMUSCULAR | Status: AC
  Filled 2021-11-10: qty 10

## 2021-11-10 MED ORDER — PROPOFOL 500 MG/50ML IV EMUL
INTRAVENOUS | Status: DC | PRN
  Administered 2021-11-10: 150 ug/kg/min via INTRAVENOUS

## 2021-11-10 MED ORDER — PHENYLEPHRINE 80 MCG/ML (10ML) SYRINGE FOR IV PUSH (FOR BLOOD PRESSURE SUPPORT)
PREFILLED_SYRINGE | INTRAVENOUS | Status: AC
  Filled 2021-11-10: qty 20

## 2021-11-10 MED ORDER — SODIUM CHLORIDE 0.9 % IV SOLN: INTRAVENOUS | Status: DC

## 2021-11-10 NOTE — Anesthesia Postprocedure Evaluation (Signed)
Anesthesia Post Note  Patient: Diana [name]  Procedure(s) Performed: COLONOSCOPY WITH PROPOFOL  Patient location during evaluation: Endoscopy Anesthesia Type: General Level of consciousness: awake and alert Pain management: pain level controlled Vital Signs Assessment: post-procedure vital signs reviewed and stable Respiratory status: spontaneous breathing, nonlabored ventilation, respiratory function stable and patient connected to nasal cannula oxygen Cardiovascular status: blood pressure returned to baseline and stable Postop Assessment: no apparent nausea or vomiting Anesthetic complications: no   No notable events documented.   Last Vitals:  Vitals:   11/10/21 1129 11/10/21 1139  BP: 114/73 132/69  Pulse: 63 63  Resp: 20 15  Temp:    SpO2: 100% 100%    Last Pain:  Vitals:   11/10/21 1139  TempSrc:   PainSc: 0-No pain                 Corinda Gubler

## 2021-11-10 NOTE — Anesthesia Preprocedure Evaluation (Signed)
Anesthesia Evaluation  Patient identified by MRN, date of birth, ID band Patient awake    Reviewed: Allergy & Precautions, NPO status , Patient's Chart, lab work & pertinent test results  History of Anesthesia Complications Negative for: history of anesthetic complications  Airway Mallampati: II  TM Distance: <3 FB Neck ROM: Full    Dental no notable dental hx. (+) Teeth Intact   Pulmonary neg pulmonary ROS, neg sleep apnea, neg COPD, Patient abstained from smoking.Not current smoker, former smoker,    Pulmonary exam normal breath sounds clear to auscultation       Cardiovascular Exercise Tolerance: Good METShypertension, Pt. on medications (-) CAD and (-) Past MI (-) dysrhythmias  Rhythm:Regular Rate:Normal - Systolic murmurs    Neuro/Psych PSYCHIATRIC DISORDERS Anxiety Depression negative neurological ROS     GI/Hepatic GERD  Medicated and Controlled,(+)     (-) substance abuse  , Hepatitis -, B  Endo/Other  neg diabetes  Renal/GU negative Renal ROS     Musculoskeletal   Abdominal   Peds  Hematology   Anesthesia Other Findings Past Medical History: No date: Allergy No date: Ear infection     Comment:  recurrent  Reproductive/Obstetrics                             Anesthesia Physical Anesthesia Plan  ASA: 2  Anesthesia Plan: General   Post-op Pain Management: Minimal or no pain anticipated   Induction: Intravenous  PONV Risk Score and Plan: 3 and Propofol infusion, TIVA and Ondansetron  Airway Management Planned: Nasal Cannula  Additional Equipment: None  Intra-op Plan:   Post-operative Plan:   Informed Consent: I have reviewed the patients History and Physical, chart, labs and discussed the procedure including the risks, benefits and alternatives for the proposed anesthesia with the patient or authorized representative who has indicated his/her understanding and  acceptance.     Dental advisory given  Plan Discussed with: CRNA and Surgeon  Anesthesia Plan Comments: (Discussed risks of anesthesia with patient, including possibility of difficulty with spontaneous ventilation under anesthesia necessitating airway intervention, PONV, and rare risks such as cardiac or respiratory or neurological events, and allergic reactions. Discussed the role of CRNA in patient's perioperative care. Patient understands.)        Anesthesia Quick Evaluation

## 2021-11-10 NOTE — Op Note (Signed)
[facility] Gastroenterology Patient Name: Diana [name] Procedure Date: 11/10/2021 10:41 AM MRN: [id number] Account #: [id number] Date of Birth: 06-17-1960 Admit Type: Outpatient Age: 61 Room: [facility] ENDO ROOM 4 Gender: Female Note Status: Finalized Instrument Name: Colonoscope [id number] Procedure:             Colonoscopy Indications:           This is the patient's first colonoscopy, Rectal                         bleeding Providers:             Lin Landsman MD, MD Referring MD:          Jearld Fenton (Referring MD) Medicines:             General Anesthesia Complications:         No immediate complications. Estimated blood loss: None. Procedure:             Pre-Anesthesia Assessment:                        - Prior to the procedure, a History and Physical was                         performed, and patient medications and allergies were                         reviewed. The patient is competent. The risks and                         benefits of the procedure and the sedation options and                         risks were discussed with the patient. All questions                         were answered and informed consent was obtained.                         Patient identification and proposed procedure were                         verified by the physician, the nurse, the                         anesthesiologist, the anesthetist and the technician                         in the pre-procedure area in the procedure room in the                         endoscopy suite. Mental Status Examination: alert and                         oriented. Airway Examination: normal oropharyngeal                         airway and neck mobility. Respiratory Examination:  clear to auscultation. CV Examination: normal.                         Prophylactic Antibiotics: The patient does not require                         prophylactic antibiotics. Prior  Anticoagulants: The                         patient has taken no previous anticoagulant or                         antiplatelet agents. ASA Grade Assessment: III - A                         patient with severe systemic disease. After reviewing                         the risks and benefits, the patient was deemed in                         satisfactory condition to undergo the procedure. The                         anesthesia plan was to use general anesthesia.                         Immediately prior to administration of medications,                         the patient was re-assessed for adequacy to receive                         sedatives. The heart rate, respiratory rate, oxygen                         saturations, blood pressure, adequacy of pulmonary                         ventilation, and response to care were monitored                         throughout the procedure. The physical status of the                         patient was re-assessed after the procedure.                        After obtaining informed consent, the colonoscope was                         passed under direct vision. Throughout the procedure,                         the patient's blood pressure, pulse, and oxygen                         saturations were monitored continuously. The  Colonoscope was introduced through the anus and                         advanced to the 10 cm into the ileum. The colonoscopy                         was performed without difficulty. The patient                         tolerated the procedure well. The quality of the bowel                         preparation was evaluated using the BBPS [facility] Bowel                         Preparation Scale) with scores of: Right Colon = 3,                         Transverse Colon = 3 and Left Colon = 3 (entire mucosa                         seen well with no residual staining, small fragments                         of  stool or opaque liquid). The total BBPS score                         equals 9. Findings:      Hemorrhoids were found on perianal exam.      The terminal ileum appeared normal.      Non-bleeding external and internal hemorrhoids were found during       retroflexion. The hemorrhoids were large and Grade III (internal       hemorrhoids that prolapse but require manual reduction).      The exam was otherwise without abnormality. Impression:            - Hemorrhoids found on perianal exam.                        - The examined portion of the ileum was normal.                        - Non-bleeding external and internal hemorrhoids.                        - The examination was otherwise normal.                        - No specimens collected. Recommendation:        - Discharge patient to home (with escort).                        - High fiber diet.                        - Continue present medications.                        - Repeat colonoscopy  in 10 years for screening                         purposes.                        - Return to my office at appointment to be scheduled                         for hemorrhoid banding if patient is agreeable. Procedure Code(s):     --- Professional ---                        [phone], Colonoscopy, flexible; diagnostic, including                         collection of specimen(s) by brushing or washing, when                         performed (separate procedure) Diagnosis Code(s):     --- Professional ---                        K64.2, Third degree hemorrhoids                        K62.5, Hemorrhage of anus and rectum CPT copyright 2019 [employer] Association. All rights reserved. The codes documented in this report are preliminary and upon coder review may  be revised to meet current compliance requirements. Dr. Libby Maw Toney Reil MD, MD 11/10/2021 11:09:41 AM This report has been signed electronically. Number of Addenda: 0 Note  Initiated On: 11/10/2021 10:41 AM Scope Withdrawal Time: 0 hours 8 minutes 35 seconds  Total Procedure Duration: 0 hours 12 minutes 55 seconds  Estimated Blood Loss:  Estimated blood loss: none.      [facility]

## 2021-11-10 NOTE — H&P (Signed)
Diana Repress, MD [address]  [address]  [place], [place] [address]  Main: [phone]  Fax: [phone] Pager: [phone]  Primary Care Physician:  Diana Munroe, NP Primary Gastroenterologist:  Dr. Arlyss [name]  Pre-Procedure History & Physical: HPI:  Diana [name] is a 61 y.o. female is here for an colonoscopy.   Past Medical History:  Diagnosis Date   Allergy    Ear infection    recurrent    Past Surgical History:  Procedure Laterality Date   ABDOMINAL HYSTERECTOMY  2019   partial   TONSILLECTOMY     TUBAL LIGATION     bilateral    Prior to Admission medications   Medication Sig Start Date End Date Taking? Authorizing Provider  Acetaminophen (TYLENOL ARTHRITIS PAIN PO) Take by mouth.   Yes [provider]  Ascorbic Acid 125 MG CHEW Chew 1 each by mouth daily.    [provider]  cetirizine (ZYRTEC) 10 MG tablet Take 10 mg by mouth daily as needed.    [provider]  Cholecalciferol (VITAMIN D3) 25 MCG (1000 UT) CAPS Take 5,000 Units by mouth.     [provider]  clonazePAM (KLONOPIN) 0.5 MG tablet Take 1 tablet (0.5 mg total) by mouth daily as needed. for anxiety 07/04/21   Diana Munroe, NP  hydrochlorothiazide (MICROZIDE) 12.5 MG capsule Take 1 capsule (12.5 mg total) by mouth daily. 08/06/21   Diana Munroe, NP  hydrocortisone (ANUSOL-HC) 2.5 % rectal cream PLACE 1 APPLICATION RECTALLY 2 TIMES DAILY. 10/24/21   Diana Munroe, NP  ibuprofen (ADVIL) 200 MG tablet Take 400-600 mg by mouth every 6 (six) hours as needed.    [provider]  omeprazole (PRILOSEC) 40 MG capsule Take 1 capsule (40 mg total) by mouth 2 (two) times daily. 08/06/21   Diana Munroe, NP  ondansetron (ZOFRAN) 4 MG tablet Take 1 tablet (4 mg total) by mouth every 8 (eight) hours as needed for nausea or vomiting. 10/27/21   Diana [name], Loel Dubonnet, MD  triamcinolone cream (KENALOG) 0.1 % Apply 1 application topically 2 (two) times daily.  11/12/20   Diana Munroe, NP    Allergies as of 10/28/2021   (No Known Allergies)    Family History  Problem Relation Age of Onset   Hypertension Mother        alive   Arthritis Mother    Lung cancer Father        deceased   Heart disease Father    Other Other        1 brother and 1 sister both alive an healthy   Hypertension Maternal Grandmother     Social History   Socioeconomic History   Marital status: Widowed    Spouse name: Not on file   Number of children: 3   Years of education: Not on file   Highest education level: Not on file  Occupational History   Occupation: customer service    Employer: [employer]  Tobacco Use   Smoking status: Former    Types: Cigarettes    Quit date: 04/20/1985    Years since quitting: 36.5   Smokeless tobacco: Never   Tobacco comments:    remote tobacco use   Vaping Use   Vaping Use: Never used  Substance and Sexual Activity   Alcohol use: No    Alcohol/week: 0.0 standard drinks of alcohol   Drug use: No   Sexual activity: Not Currently  Other Topics Concern   Not on file  Social History Narrative   Not on file   Social Determinants of Health   Financial Resource Strain: Not on file  Food Insecurity: Not on file  Transportation Needs: Not on file  Physical Activity: Not on file  Stress: Not on file  Social Connections: Not on file  Intimate Partner Violence: Not on file    Review of Systems: See HPI, otherwise negative ROS  Physical Exam: BP (!) 148/89   Pulse 76   Temp (!) 96.5 F (35.8 C) (Temporal)   Resp 16   Ht 5\' 6"  (1.676 m)   Wt 96.2 kg   LMP 01/20/2016   SpO2 100%   BMI 34.22 kg/m  General:   Alert,  pleasant and cooperative in NAD Head:  Normocephalic and atraumatic. Neck:  Supple; no masses or thyromegaly. Lungs:  Clear throughout to auscultation.    Heart:  Regular rate and rhythm. Abdomen:  Soft, nontender and nondistended. Normal bowel sounds, without guarding, and without rebound.    Neurologic:  Alert and  oriented x4;  grossly normal neurologically.  Impression/Plan: Diana [name] is here for an colonoscopy to be performed for rectal bleeding  Risks, benefits, limitations, and alternatives regarding  colonoscopy have been reviewed with the patient.  Questions have been answered.  All parties agreeable.   Ermalene Postin, MD  11/10/2021, 10:45 AM

## 2021-11-10 NOTE — Transfer of Care (Signed)
Immediate Anesthesia Transfer of Care Note  Patient: Diana [name]  Procedure(s) Performed: COLONOSCOPY WITH PROPOFOL  Patient Location: PACU  Anesthesia Type:General  Level of Consciousness: drowsy  Airway & Oxygen Therapy: Patient Spontanous Breathing and Patient connected to face mask oxygen  Post-op Assessment: Report given to RN and Post -op Vital signs reviewed and stable  Post vital signs: Reviewed and stable  Last Vitals:  Vitals Value Taken Time  BP    Temp    Pulse    Resp    SpO2      Last Pain:  Vitals:   11/10/21 1011  TempSrc: Temporal  PainSc: 0-No pain         Complications: No notable events documented.

## 2021-11-11 ENCOUNTER — Encounter: Payer: Self-pay | Admitting: Gastroenterology

## 2021-11-17 ENCOUNTER — Other Ambulatory Visit: Payer: Self-pay | Admitting: Internal Medicine

## 2021-11-17 ENCOUNTER — Ambulatory Visit: Payer: BC Managed Care – PPO | Admitting: Internal Medicine

## 2021-11-17 DIAGNOSIS — K644 Residual hemorrhoidal skin tags: Secondary | ICD-10-CM

## 2021-11-17 DIAGNOSIS — E1169 Type 2 diabetes mellitus with other specified complication: Secondary | ICD-10-CM | POA: Insufficient documentation

## 2021-11-17 DIAGNOSIS — E7800 Pure hypercholesterolemia, unspecified: Secondary | ICD-10-CM | POA: Insufficient documentation

## 2021-11-17 NOTE — Progress Notes (Deleted)
Subjective:    Patient ID: Diana [name], female    DOB: 11-01-1960, 61 y.o.   MRN: [id number]  HPI  Patient presents to clinic today for follow-up of chronic conditions.  HTN: Her BP today is.  She is taking HCTZ as prescribed.  ECG from 06/2021 reviewed.  Anxiety: Chronic, managed on Clonazepam as needed.  She is not currently seeing a therapist.  She denies depression, SI/HI.  GERD: She denies breakthrough on Omeprazole.  There is no upper GI on file.  Hep B: Status post antiviral therapy.  She follows with GI.  Low Back Pain: Intermittent.  She takes Methocarbamol as needed with good relief of symptoms.  Prediabetes: Her last A1c was 5.9%, 02/2021.  She is not taking any oral diabetic medication at this time.  She does not check her sugars.  HLD: Her last LDL was 128, triglycerides 120, 02/2021.  She is not taking any cholesterol-lowering medication at this time.  She does not consume a low-fat diet.  Review of Systems     Past Medical History:  Diagnosis Date   Allergy    Ear infection    recurrent    Current Outpatient Medications  Medication Sig Dispense Refill   Acetaminophen (TYLENOL ARTHRITIS PAIN PO) Take by mouth.     Ascorbic Acid 125 MG CHEW Chew 1 each by mouth daily.     cetirizine (ZYRTEC) 10 MG tablet Take 10 mg by mouth daily as needed.     Cholecalciferol (VITAMIN D3) 25 MCG (1000 UT) CAPS Take 5,000 Units by mouth.      clonazePAM (KLONOPIN) 0.5 MG tablet Take 1 tablet (0.5 mg total) by mouth daily as needed. for anxiety 30 tablet 0   hydrochlorothiazide (MICROZIDE) 12.5 MG capsule Take 1 capsule (12.5 mg total) by mouth daily. 90 capsule 0   hydrocortisone (ANUSOL-HC) 2.5 % rectal cream PLACE 1 APPLICATION RECTALLY 2 TIMES DAILY. 30 g 0   ibuprofen (ADVIL) 200 MG tablet Take 400-600 mg by mouth every 6 (six) hours as needed.     omeprazole (PRILOSEC) 40 MG capsule Take 1 capsule (40 mg total) by mouth 2 (two) times daily. 180 capsule 0   ondansetron  (ZOFRAN) 4 MG tablet Take 1 tablet (4 mg total) by mouth every 8 (eight) hours as needed for nausea or vomiting. 10 tablet 0   triamcinolone cream (KENALOG) 0.1 % Apply 1 application topically 2 (two) times daily. 30 g 0   No current facility-administered medications for this visit.    No Known Allergies  Family History  Problem Relation Age of Onset   Hypertension Mother        alive   Arthritis Mother    Lung cancer Father        deceased   Heart disease Father    Other Other        1 brother and 1 sister both alive an healthy   Hypertension Maternal Grandmother     Social History   Socioeconomic History   Marital status: Widowed    Spouse name: Not on file   Number of children: 3   Years of education: Not on file   Highest education level: Not on file  Occupational History   Occupation: customer service    Employer: [employer]  Tobacco Use   Smoking status: Former    Types: Cigarettes    Quit date: 04/20/1985    Years since quitting: 36.6   Smokeless tobacco: Never   Tobacco  comments:    remote tobacco use   Vaping Use   Vaping Use: Never used  Substance and Sexual Activity   Alcohol use: No    Alcohol/week: 0.0 standard drinks of alcohol   Drug use: No   Sexual activity: Not Currently  Other Topics Concern   Not on file  Social History Narrative   Not on file   Social Determinants of Health   Financial Resource Strain: Not on file  Food Insecurity: Not on file  Transportation Needs: Not on file  Physical Activity: Not on file  Stress: Not on file  Social Connections: Not on file  Intimate Partner Violence: Not on file     Constitutional: Denies fever, malaise, fatigue, headache or abrupt weight changes.  HEENT: Denies eye pain, eye redness, ear pain, ringing in the ears, wax buildup, runny nose, nasal congestion, bloody nose, or sore throat. Respiratory: Denies difficulty breathing, shortness of breath, cough or sputum production.    Cardiovascular: Denies chest pain, chest tightness, palpitations or swelling in the hands or feet.  Gastrointestinal: Denies abdominal pain, bloating, constipation, diarrhea or blood in the stool.  GU: Denies urgency, frequency, pain with urination, burning sensation, blood in urine, odor or discharge. Musculoskeletal: Patient reports intermittent low back pain.  Denies decrease in range of motion, difficulty with gait, or joint pain and swelling.  Skin: Denies redness, rashes, lesions or ulcercations.  Neurological: Denies dizziness, difficulty with memory, difficulty with speech or problems with balance and coordination.  Psych: Patient has a history of anxiety.  Denies depression, SI/HI.  No other specific complaints in a complete review of systems (except as listed in HPI above).  Objective:   Physical Exam  LMP 01/20/2016  Wt Readings from Last 3 Encounters:  11/10/21 212 lb (96.2 kg)  10/27/21 213 lb 4 oz (96.7 kg)  10/16/21 218 lb (98.9 kg)    General: Appears their stated age, well developed, well nourished in NAD. Skin: Warm, dry and intact. No rashes, lesions or ulcerations noted. HEENT: Head: normal shape and size; Eyes: sclera white, no icterus, conjunctiva pink, PERRLA and EOMs intact; Ears: Tm's gray and intact, normal light reflex; Nose: mucosa pink and moist, septum midline; Throat/Mouth: Teeth present, mucosa pink and moist, no exudate, lesions or ulcerations noted.  Neck:  Neck supple, trachea midline. No masses, lumps or thyromegaly present.  Cardiovascular: Normal rate and rhythm. S1,S2 noted.  No murmur, rubs or gallops noted. No JVD or BLE edema. No carotid bruits noted. Pulmonary/Chest: Normal effort and positive vesicular breath sounds. No respiratory distress. No wheezes, rales or ronchi noted.  Abdomen: Soft and nontender. Normal bowel sounds. No distention or masses noted. Liver, spleen and kidneys non palpable. Musculoskeletal: Normal range of motion. No signs  of joint swelling. No difficulty with gait.  Neurological: Alert and oriented. Cranial nerves II-XII grossly intact. Coordination normal.  Psychiatric: Mood and affect normal. Behavior is normal. Judgment and thought content normal.    BMET    Component Value Date/Time   NA 141 07/02/2021 2000   K 3.2 (L) 07/02/2021 2000   CL 100 07/02/2021 2000   CO2 30 07/02/2021 2000   GLUCOSE 109 (H) 07/02/2021 2000   BUN 14 07/02/2021 2000   CREATININE 0.64 07/02/2021 2000   CREATININE 0.66 05/14/2021 1030   CALCIUM 9.3 07/02/2021 2000   GFRNONAA >60 07/02/2021 2000   GFRAA >60 07/27/2017 2153    Lipid Panel     Component Value Date/Time   CHOL 189 07/28/2021  1553   TRIG 120 07/28/2021 1553   HDL 27 (L) 02/18/2021 1016   CHOLHDL 6.3 (H) 02/18/2021 1016   VLDL 21.0 02/09/2019 1245   LDLCALC 128 (H) 02/18/2021 1016    CBC    Component Value Date/Time   WBC 3.9 (L) 07/02/2021 2000   RBC 4.73 07/02/2021 2000   HGB 14.1 07/02/2021 2000   HCT 41.5 07/02/2021 2000   PLT 219 07/02/2021 2000   MCV 87.7 07/02/2021 2000   MCH 29.8 07/02/2021 2000   MCHC 34.0 07/02/2021 2000   RDW 13.5 07/02/2021 2000   LYMPHSABS 1.1 07/28/2016 1232   MONOABS 0.4 07/28/2016 1232   EOSABS 0.0 07/28/2016 1232   BASOSABS 0.0 07/28/2016 1232    Hgb A1C Lab Results  Component Value Date   HGBA1C 5.9 (H) 02/18/2021            Assessment & Plan:     RTC in 6 months for your annual exam Webb Silversmith, NP

## 2021-11-18 NOTE — Telephone Encounter (Signed)
Requested medication (s) are due for refill today: yes  Requested medication (s) are on the active medication list: yes  Last refill:  10/24/21 #30 grams  Future visit scheduled: yes  Notes to clinic:  med not delegated to NT to RF/off protocol   Requested Prescriptions  Pending Prescriptions Disp Refills   hydrocortisone (ANUSOL-HC) 2.5 % rectal cream [Pharmacy Med Name: HYDROCORTISONE 2.5% CREAM 2.5 Cream] 30 g 0    Sig: PLACE 1 APPLICATION RECTALLY 2 TIMES DAILY.     Off-Protocol Failed - 11/17/2021 11:12 AM      Failed - Medication not assigned to a protocol, review manually.      Passed - Valid encounter within last 12 months    Recent Outpatient Visits           1 month ago Tick bite of neck, initial encounter   [facility] [place], Salvadore Oxford, NP   3 months ago Pelvic pain   [facility] [place], Salvadore Oxford, NP   3 months ago Suprapubic pain   [facility] [place], [place] W, NP   4 months ago Acute bilateral thoracic back pain   [facility] [place], Salvadore Oxford, NP   6 months ago Upper back pain   [facility] [place], Salvadore Oxford, NP       Future Appointments             In 1 week Sampson Si, Salvadore Oxford, NP [facility]           Not Delegated - Over the Counter: OTC 2 Failed - 11/17/2021 11:12 AM      Failed - This refill cannot be delegated      Passed - Valid encounter within last 12 months    Recent Outpatient Visits           1 month ago Tick bite of neck, initial encounter   [facility] [place], Salvadore Oxford, NP   3 months ago Pelvic pain   [facility] [place], Salvadore Oxford, NP   3 months ago Suprapubic pain   [facility] [place], [place], NP   4 months ago Acute bilateral thoracic back pain   [facility] [place], Salvadore Oxford, NP   6 months ago Upper back pain   [facility] [place], Salvadore Oxford, NP       Future  Appointments             In 1 week Sampson Si, Salvadore Oxford, NP [facility], [place]

## 2021-11-20 ENCOUNTER — Other Ambulatory Visit: Payer: Self-pay | Admitting: Internal Medicine

## 2021-11-20 DIAGNOSIS — K219 Gastro-esophageal reflux disease without esophagitis: Secondary | ICD-10-CM

## 2021-11-20 NOTE — Addendum Note (Signed)
Addended by: Kavin Leech E on: 11/20/2021 04:12 PM   Modules accepted: Orders

## 2021-11-20 NOTE — Telephone Encounter (Signed)
Pt called asking about the refill on her Anusol.  She said it will be 4 weeks tomorrow since she got it filled.  She is out and needs a refill.  [facility] is her pharmacy  [phone]  [phone]

## 2021-11-21 NOTE — Telephone Encounter (Signed)
She should not be using this daily.  How often is she using this?

## 2021-11-21 NOTE — Telephone Encounter (Signed)
Requested medication (s) are due for refill today:   Provider to review  Requested medication (s) are on the active medication list:   Yes  Future visit scheduled:   Yes in 6 days   Last ordered: 07/04/2021 #30, 0 refills  Non delegated refill    Requested Prescriptions  Pending Prescriptions Disp Refills   clonazePAM (KLONOPIN) 0.5 MG tablet [Pharmacy Med Name: CLONAZEPAM 0.5 MG TABLET 0.5 Tablet] 30 tablet 0    Sig: TAKE 1 TABLET BY MOUTH DAILY AS NEEDED FOR ANXIETY     Not Delegated - Psychiatry: Anxiolytics/Hypnotics 2 Failed - 11/20/2021  3:18 PM      Failed - This refill cannot be delegated      Failed - Urine Drug Screen completed in last 360 days      Passed - Patient is not pregnant      Passed - Valid encounter within last 6 months    Recent Outpatient Visits           1 month ago Tick bite of neck, initial encounter   [facility] [place], Salvadore Oxford, NP   3 months ago Pelvic pain   [facility] [place], Salvadore Oxford, NP   3 months ago Suprapubic pain   [facility] [place], [place], NP   4 months ago Acute bilateral thoracic back pain   [facility] [place], Salvadore Oxford, NP   6 months ago Upper back pain   [facility] [place], Salvadore Oxford, NP       Future Appointments             In 6 days [place], Salvadore Oxford, NP [facility], [facility]

## 2021-11-26 NOTE — Telephone Encounter (Signed)
Pt states she does use everyday but has had a recent flare since being constipated.

## 2021-11-27 ENCOUNTER — Ambulatory Visit: Payer: BC Managed Care – PPO | Admitting: Internal Medicine

## 2021-12-13 ENCOUNTER — Other Ambulatory Visit: Payer: Self-pay | Admitting: Internal Medicine

## 2021-12-15 ENCOUNTER — Ambulatory Visit: Payer: BC Managed Care – PPO | Admitting: Internal Medicine

## 2021-12-15 ENCOUNTER — Encounter: Payer: Self-pay | Admitting: Internal Medicine

## 2021-12-15 VITALS — BP 156/98 | HR 69 | Temp 96.9°F | Wt 218.0 lb

## 2021-12-15 DIAGNOSIS — E7800 Pure hypercholesterolemia, unspecified: Secondary | ICD-10-CM | POA: Diagnosis not present

## 2021-12-15 DIAGNOSIS — Z79899 Other long term (current) drug therapy: Secondary | ICD-10-CM

## 2021-12-15 DIAGNOSIS — F32A Depression, unspecified: Secondary | ICD-10-CM

## 2021-12-15 DIAGNOSIS — B181 Chronic viral hepatitis B without delta-agent: Secondary | ICD-10-CM

## 2021-12-15 DIAGNOSIS — R102 Pelvic and perineal pain: Secondary | ICD-10-CM | POA: Diagnosis not present

## 2021-12-15 DIAGNOSIS — K219 Gastro-esophageal reflux disease without esophagitis: Secondary | ICD-10-CM

## 2021-12-15 DIAGNOSIS — M545 Low back pain, unspecified: Secondary | ICD-10-CM

## 2021-12-15 DIAGNOSIS — F419 Anxiety disorder, unspecified: Secondary | ICD-10-CM

## 2021-12-15 DIAGNOSIS — I10 Essential (primary) hypertension: Secondary | ICD-10-CM

## 2021-12-15 DIAGNOSIS — R7303 Prediabetes: Secondary | ICD-10-CM

## 2021-12-15 DIAGNOSIS — Z6835 Body mass index (BMI) 35.0-35.9, adult: Secondary | ICD-10-CM

## 2021-12-15 MED ORDER — OMEPRAZOLE 40 MG PO CPDR: 40.0000 mg | DELAYED_RELEASE_CAPSULE | Freq: Two times a day (BID) | ORAL | 1 refills | Status: DC

## 2021-12-15 MED ORDER — LOSARTAN POTASSIUM-HCTZ 50-12.5 MG PO TABS: 1.0000 | ORAL_TABLET | Freq: Every day | ORAL | 1 refills | Status: DC

## 2021-12-15 NOTE — Assessment & Plan Note (Signed)
C-Met and lipid profile today Encourage low-fat diet 

## 2021-12-15 NOTE — Assessment & Plan Note (Signed)
Encourage diet and exercise weight loss 

## 2021-12-15 NOTE — Patient Instructions (Signed)
Gallbladder Eating Plan High blood cholesterol, obesity, a sedentary lifestyle, an unhealthy diet, and diabetes are risk factors for developing gallstones. If you have a gallbladder condition, you may have trouble digesting fats and tolerating high fat intake. Eating a low-fat diet can help reduce your symptoms and may be helpful before and after having surgery to remove your gallbladder (cholecystectomy). Your health care provider may recommend that you work with a dietitian to help you reduce the amount of fat in your diet. What are tips for following this plan? General guidelines Limit your fat intake to less than 30% of your total daily calories. If you eat around 1,800 calories each day, this means eating less than 60 grams (g) of fat per day. Fat is an important part of a healthy diet. Eating a low-fat diet can make it hard to maintain a healthy body weight. Ask your dietitian how much fat, calories, and other nutrients you need each day. Eat small, frequent meals throughout the day instead of three large meals. Drink at least 8-10 cups (1.9-2.4 L) of fluid a day. Drink enough fluid to keep your urine pale yellow. If you drink alcohol: Limit how much you have to: 0-1 drink a day for women who are not pregnant. 0-2 drinks a day for men. Know how much alcohol is in a drink. In the [place] one drink equals one 12 oz bottle of beer (355 mL), one 5 oz glass of wine (148 mL), or one 1 oz glass of hard liquor (44 mL). Reading food labels  Check nutrition facts on food labels for the amount of fat per serving. Choose foods with less than 3 grams of fat per serving. Shopping Choose nonfat and low-fat healthy foods. Look for the words "nonfat," "low-fat," or "fat-free." Avoid buying processed or prepackaged foods. Cooking Cook using low-fat methods, such as baking, broiling, grilling, or boiling. Cook with small amounts of healthy fats, such as olive oil, grapeseed oil, canola oil, avocado oil, or  sunflower oil. What foods are recommended?  All fresh, frozen, or canned fruits and vegetables. Whole grains. Low-fat or nonfat (skim) milk and yogurt. Lean meat, skinless poultry, fish, eggs, and beans. Low-fat protein supplement powders or drinks. Spices and herbs. The items listed above may not be a complete list of foods and beverages you can eat and drink. Contact a dietitian for more information. What foods are not recommended? High-fat foods. These include baked goods, fast food, fatty cuts of meat, ice cream, french toast, sweet rolls, pizza, cheese bread, foods covered with butter, creamy sauces, or cheese. Fried foods. These include french fries, tempura, battered fish, breaded chicken, fried breads, and sweets. Foods that cause bloating and gas. The items listed above may not be a complete list of foods that you should avoid. Contact a dietitian for more information. Summary A low-fat diet can be helpful if you have a gallbladder condition, or before and after gallbladder surgery. Limit your fat intake to less than 30% of your total daily calories. This is about 60 g of fat if you eat 1,800 calories each day. Eat small, frequent meals throughout the day instead of three large meals. This information is not intended to replace advice given to you by your health care provider. Make sure you discuss any questions you have with your health care provider. Document Revised: 03/21/2021 Document Reviewed: 03/21/2021 Elsevier Patient Education  2023 [employer] Inc.  

## 2021-12-15 NOTE — Assessment & Plan Note (Signed)
Try to identify and avoid foods that trigger reflux Encourage weight loss as this can help reduce reflux symptoms Continue omeprazole, refilled today

## 2021-12-15 NOTE — Assessment & Plan Note (Signed)
Encourage weight loss and core strengthening as this can help reduce low back pain Continue methocarbamol as needed

## 2021-12-15 NOTE — Assessment & Plan Note (Signed)
C-Met today 

## 2021-12-15 NOTE — Progress Notes (Signed)
Subjective:    Patient ID: Diana [name], female    DOB: [birth year], 61 y.o.   MRN: [id number]  HPI  Patient presents to clinic today for follow-up of chronic conditions.  HTN: Her BP today is 148/96.  She is taking HCTZ as prescribed.  ECG from 06/2021 reviewed.  Anxiety: Persistent.  She takes Clonazepam as needed with good results.  She is not currently seeing a therapist.  She denies depression, SI/HI.  GERD: She is not sure what triggers this.  She denies breakthrough on Omeprazole.  There is no upper GI on file.  Chronic Hep B: In remission status post antiviral therapy.  She follows with GI.  Low Back Pain: Intermittent, she takes Methocarbamol as needed with good relief of symptoms.  Prediabetes: Her last A1c was 5.9%, 02/2021.  She is not taking any oral diabetic medication at this time.  She does not check her sugars.  HLD: Her last LDL was 128, triglycerides 120, 02/2021.  She is not taking any cholesterol-lowering medication at this time.  She does not consume a low-fat diet.  Review of Systems     Past Medical History:  Diagnosis Date   Allergy    Ear infection    recurrent    Current Outpatient Medications  Medication Sig Dispense Refill   Acetaminophen (TYLENOL ARTHRITIS PAIN PO) Take by mouth.     Ascorbic Acid 125 MG CHEW Chew 1 each by mouth daily.     cetirizine (ZYRTEC) 10 MG tablet Take 10 mg by mouth daily as needed.     Cholecalciferol (VITAMIN D3) 25 MCG (1000 UT) CAPS Take 5,000 Units by mouth.      clonazePAM (KLONOPIN) 0.5 MG tablet TAKE 1 TABLET BY MOUTH DAILY AS NEEDED FOR ANXIETY 30 tablet 0   hydrochlorothiazide (MICROZIDE) 12.5 MG capsule Take 1 capsule (12.5 mg total) by mouth daily. 90 capsule 0   hydrocortisone (ANUSOL-HC) 2.5 % rectal cream PLACE 1 APPLICATION RECTALLY 2 TIMES DAILY. 30 g 0   ibuprofen (ADVIL) 200 MG tablet Take 400-600 mg by mouth every 6 (six) hours as needed.     omeprazole (PRILOSEC) 40 MG capsule Take 1 capsule (40  mg total) by mouth 2 (two) times daily. 180 capsule 0   ondansetron (ZOFRAN) 4 MG tablet Take 1 tablet (4 mg total) by mouth every 8 (eight) hours as needed for nausea or vomiting. 10 tablet 0   triamcinolone cream (KENALOG) 0.1 % Apply 1 application topically 2 (two) times daily. 30 g 0   No current facility-administered medications for this visit.    No Known Allergies  Family History  Problem Relation Age of Onset   Hypertension Mother        alive   Arthritis Mother    Lung cancer Father        deceased   Heart disease Father    Other Other        1 brother and 1 sister both alive an healthy   Hypertension Maternal Grandmother     Social History   Socioeconomic History   Marital status: Widowed    Spouse name: Not on file   Number of children: 3   Years of education: Not on file   Highest education level: Not on file  Occupational History   Occupation: customer service    Employer: [employer]  Tobacco Use   Smoking status: Former    Types: Cigarettes    Quit date: 04/20/1985  Years since quitting: 36.6   Smokeless tobacco: Never   Tobacco comments:    remote tobacco use   Vaping Use   Vaping Use: Never used  Substance and Sexual Activity   Alcohol use: No    Alcohol/week: 0.0 standard drinks of alcohol   Drug use: No   Sexual activity: Not Currently  Other Topics Concern   Not on file  Social History Narrative   Not on file   Social Determinants of Health   Financial Resource Strain: Not on file  Food Insecurity: Not on file  Transportation Needs: Not on file  Physical Activity: Not on file  Stress: Not on file  Social Connections: Not on file  Intimate Partner Violence: Not on file     Constitutional: Patient reports weight gain.  Denies fever, malaise, fatigue, headache.  HEENT: Denies eye pain, eye redness, ear pain, ringing in the ears, wax buildup, runny nose, nasal congestion, bloody nose, or sore throat. Respiratory: Denies difficulty  breathing, shortness of breath, cough or sputum production.   Cardiovascular: Denies chest pain, chest tightness, palpitations or swelling in the hands or feet.  Gastrointestinal: Patient reports hemorrhoids.  Denies abdominal pain, bloating, constipation, diarrhea or blood in the stool.  GU: Denies urgency, frequency, pain with urination, burning sensation, blood in urine, odor or discharge. Musculoskeletal: Patient reports chronic low back pain.  Denies decrease in range of motion, difficulty with gait, or joint swelling.  Skin: Denies redness, rashes, lesions or ulcercations.  Neurological: Denies dizziness, difficulty with memory, difficulty with speech or problems with balance and coordination.  Psych: Patient has a history of anxiety.  Denies depression, SI/HI.  No other specific complaints in a complete review of systems (except as listed in HPI above).  Objective:   Physical Exam  BP (!) 156/98 (BP Location: Right Arm, Patient Position: Sitting, Cuff Size: Large)   Pulse 69   Temp (!) 96.9 F (36.1 C) (Temporal)   Wt 218 lb (98.9 kg)   LMP 01/20/2016   SpO2 100%   BMI 35.19 kg/m   Wt Readings from Last 3 Encounters:  11/10/21 212 lb (96.2 kg)  10/27/21 213 lb 4 oz (96.7 kg)  10/16/21 218 lb (98.9 kg)    General: Appears her stated age, obese, in NAD. Skin: Warm, dry and intact.  HEENT: Head: normal shape and size; Eyes: sclera white, no icterus, conjunctiva pink, PERRLA and EOMs intact;  Cardiovascular: Normal rate and rhythm. S1,S2 noted.  No murmur, rubs or gallops noted. No JVD or BLE edema. No carotid bruits noted. Pulmonary/Chest: Normal effort and positive vesicular breath sounds. No respiratory distress. No wheezes, rales or ronchi noted.  Abdomen: Soft and nontender. Normal bowel sounds.  Musculoskeletal:  No difficulty with gait.  Neurological: Alert and oriented.  Psychiatric: Mood and affect normal. Behavior is normal. Judgment and thought content normal.    BMET    Component Value Date/Time   NA 141 07/02/2021 2000   K 3.2 (L) 07/02/2021 2000   CL 100 07/02/2021 2000   CO2 30 07/02/2021 2000   GLUCOSE 109 (H) 07/02/2021 2000   BUN 14 07/02/2021 2000   CREATININE 0.64 07/02/2021 2000   CREATININE 0.66 05/14/2021 1030   CALCIUM 9.3 07/02/2021 2000   GFRNONAA >60 07/02/2021 2000   GFRAA >60 07/27/2017 2153    Lipid Panel     Component Value Date/Time   CHOL 189 07/28/2021 1553   TRIG 120 07/28/2021 1553   HDL 27 (L) 02/18/2021 1016  CHOLHDL 6.3 (H) 02/18/2021 1016   VLDL 21.0 02/09/2019 1245   LDLCALC 128 (H) 02/18/2021 1016    CBC    Component Value Date/Time   WBC 3.9 (L) 07/02/2021 2000   RBC 4.73 07/02/2021 2000   HGB 14.1 07/02/2021 2000   HCT 41.5 07/02/2021 2000   PLT 219 07/02/2021 2000   MCV 87.7 07/02/2021 2000   MCH 29.8 07/02/2021 2000   MCHC 34.0 07/02/2021 2000   RDW 13.5 07/02/2021 2000   LYMPHSABS 1.1 07/28/2016 1232   MONOABS 0.4 07/28/2016 1232   EOSABS 0.0 07/28/2016 1232   BASOSABS 0.0 07/28/2016 1232    Hgb A1C Lab Results  Component Value Date   HGBA1C 5.9 (H) 02/18/2021           Assessment & Plan:     RTC in 2 weeks for BP follow-up, 6 months for your annual exam [name], NP

## 2021-12-15 NOTE — Assessment & Plan Note (Signed)
Stable on clonazepam as needed UDS today

## 2021-12-15 NOTE — Assessment & Plan Note (Signed)
A1c today Encourage low-carb diet and exercise for weight loss 

## 2021-12-15 NOTE — Telephone Encounter (Signed)
Discontinued today. Requested Prescriptions  Pending Prescriptions Disp Refills  . hydrochlorothiazide (MICROZIDE) 12.5 MG capsule [Pharmacy Med Name: HYDROCHLOROTHIAZIDE 12.5 MG 12.5 Capsule] 30 capsule 1    Sig: TAKE 1 CAPSULE BY MOUTH DAILY. MUST SCHEDULE PHYSICAL EXAM     Cardiovascular: Diuretics - Thiazide Failed - 12/13/2021 12:29 PM      Failed - K in normal range and within 180 days    Potassium  Date Value Ref Range Status  07/02/2021 3.2 (L) 3.5 - 5.1 mmol/L Final         Passed - Cr in normal range and within 180 days    Creat  Date Value Ref Range Status  05/14/2021 0.66 0.50 - 1.03 mg/dL Final   Creatinine, Ser  Date Value Ref Range Status  07/02/2021 0.64 0.44 - 1.00 mg/dL Final         Passed - Na in normal range and within 180 days    Sodium  Date Value Ref Range Status  07/02/2021 141 135 - 145 mmol/L Final         Passed - Last BP in normal range    BP Readings from Last 1 Encounters:  12/15/21 (!) 156/98         Passed - Valid encounter within last 6 months    Recent Outpatient Visits          Today Prediabetes   [facility] [place], Salvadore Oxford, NP   2 months ago Tick bite of neck, initial encounter   [facility] [place], Salvadore Oxford, NP   4 months ago Pelvic pain   [facility] [place], Salvadore Oxford, NP   4 months ago Suprapubic pain   [facility] [place], [place], NP   5 months ago Acute bilateral thoracic back pain   [facility] [place], Salvadore Oxford, NP      Future Appointments            In 2 weeks Sampson Si, Salvadore Oxford, NP [facility], [facility]

## 2021-12-15 NOTE — Assessment & Plan Note (Signed)
Uncontrolled We will add losartan HCT C-Met today Reinforced DASH diet and exercise for weight loss

## 2021-12-16 ENCOUNTER — Telehealth: Payer: Self-pay

## 2021-12-16 DIAGNOSIS — E7800 Pure hypercholesterolemia, unspecified: Secondary | ICD-10-CM

## 2021-12-16 LAB — DRUG MONITORING, PANEL 8 WITH CONFIRMATION, URINE
6 Acetylmorphine: NEGATIVE ng/mL (ref <10)
Alcohol Metabolites: NEGATIVE ng/mL (ref <500)
Amphetamines: NEGATIVE ng/mL (ref <500)
Benzodiazepines: NEGATIVE ng/mL (ref <100)
Buprenorphine, Urine: NEGATIVE ng/mL (ref <5)
Cocaine Metabolite: NEGATIVE ng/mL (ref <150)
Creatinine: 76.1 mg/dL (ref >=20.0)
MDMA: NEGATIVE ng/mL (ref <500)
Marijuana Metabolite: NEGATIVE ng/mL (ref <20)
Opiates: NEGATIVE ng/mL (ref <100)
Oxidant: NEGATIVE ug/mL (ref <200)
Oxycodone: NEGATIVE ng/mL (ref <100)
pH: 6.8 (ref 4.5–9.0)

## 2021-12-16 LAB — COMPLETE METABOLIC PANEL WITH GFR
AG Ratio: 1.6 (calc) (ref 1.0–2.5)
ALT: 11 U/L (ref 6–29)
AST: 11 U/L (ref 10–35)
Albumin: 4.4 g/dL (ref 3.6–5.1)
Alkaline phosphatase (APISO): 80 U/L (ref 37–153)
BUN: 12 mg/dL (ref 7–25)
CO2: 25 mmol/L (ref 20–32)
Calcium: 9.2 mg/dL (ref 8.6–10.4)
Chloride: 105 mmol/L (ref 98–110)
Creat: 0.64 mg/dL (ref 0.50–1.03)
Globulin: 2.8 g/dL (calc) (ref 1.9–3.7)
Glucose, Bld: 135 mg/dL — ABNORMAL HIGH (ref 65–99)
Potassium: 3.8 mmol/L (ref 3.5–5.3)
Sodium: 143 mmol/L (ref 135–146)
Total Bilirubin: 1.7 mg/dL — ABNORMAL HIGH (ref 0.2–1.2)
Total Protein: 7.2 g/dL (ref 6.1–8.1)
eGFR: 102 mL/min/{1.73_m2} (ref >=60)

## 2021-12-16 LAB — LIPID PANEL
Cholesterol: 220 mg/dL — ABNORMAL HIGH (ref <200)
HDL: 23 mg/dL — ABNORMAL LOW (ref >=50)
LDL Cholesterol (Calc): 177 mg/dL (calc) — ABNORMAL HIGH
Non-HDL Cholesterol (Calc): 197 mg/dL (calc) — ABNORMAL HIGH (ref <130)
Total CHOL/HDL Ratio: 9.6 (calc) — ABNORMAL HIGH (ref <5.0)
Triglycerides: 88 mg/dL (ref <150)

## 2021-12-16 LAB — DM TEMPLATE

## 2021-12-16 LAB — HEMOGLOBIN A1C
Hgb A1c MFr Bld: 6 % of total Hgb — ABNORMAL HIGH (ref <5.7)
Mean Plasma Glucose: 126 mg/dL
eAG (mmol/L): 7 mmol/L

## 2021-12-16 NOTE — Telephone Encounter (Signed)
Pt. Calling about lab results seen in My Chart. States she will take a statin. Concerned about the elevated bilirubin. Please advise pt.

## 2021-12-17 NOTE — Telephone Encounter (Signed)
Patient calling back to check status. Please advise.

## 2021-12-19 MED ORDER — ATORVASTATIN CALCIUM 10 MG PO TABS: 10.0000 mg | ORAL_TABLET | Freq: Every day | ORAL | 1 refills | Status: DC

## 2021-12-19 NOTE — Telephone Encounter (Signed)
Atorvastatin sent to pharmacy.  She is to schedule 3 month lab only appointment to repeat cholesterol and liver function.  Her bilirubin has been chronically elevated and stable.  She has no evidence of gallstones on imaging.  Given the fact that she has a normal AST/ALT, elevated bilirubin is not concerning but something we will monitor.

## 2021-12-19 NOTE — Addendum Note (Signed)
Addended by: [name] on: 12/19/2021 09:57 AM   Modules accepted: Orders

## 2021-12-19 NOTE — Telephone Encounter (Signed)
LMTCB 12/19/2021.  PEC please advise pt as below and also schedule a 3 month lab only appointment when she calls back.   Thanks,   -Vernona Rieger

## 2021-12-23 ENCOUNTER — Institutional Professional Consult (permissible substitution): Payer: BC Managed Care – PPO | Admitting: Adult Health

## 2021-12-30 ENCOUNTER — Encounter: Payer: Self-pay | Admitting: Internal Medicine

## 2021-12-30 ENCOUNTER — Ambulatory Visit: Payer: BC Managed Care – PPO | Admitting: Internal Medicine

## 2021-12-30 ENCOUNTER — Institutional Professional Consult (permissible substitution): Payer: BC Managed Care – PPO | Admitting: Primary Care

## 2021-12-30 DIAGNOSIS — I10 Essential (primary) hypertension: Secondary | ICD-10-CM | POA: Diagnosis not present

## 2021-12-30 DIAGNOSIS — Z6835 Body mass index (BMI) 35.0-35.9, adult: Secondary | ICD-10-CM

## 2021-12-30 NOTE — Assessment & Plan Note (Signed)
Controlled on Losartan HCT Reinforced DASH diet and exercise for weight loss

## 2021-12-30 NOTE — Progress Notes (Signed)
Subjective:    Patient ID: Diana [name], female    DOB: Aug 04, 1960, 61 y.o.   MRN: [id number]  HPI  Patient presents to clinic today for 2-week follow-up of HTN.  At her last visit, her BP was elevated and she was started on Losartan HCT.  She has been taking the medication as prescribed.  Her BP today is 134/78.  ECG from 06/2021 reviewed.  Review of Systems     Past Medical History:  Diagnosis Date   Allergy    Ear infection    recurrent    Current Outpatient Medications  Medication Sig Dispense Refill   Acetaminophen (TYLENOL ARTHRITIS PAIN PO) Take by mouth.     Ascorbic Acid 125 MG CHEW Chew 1 each by mouth daily.     atorvastatin (LIPITOR) 10 MG tablet Take 1 tablet (10 mg total) by mouth daily. 90 tablet 1   cetirizine (ZYRTEC) 10 MG tablet Take 10 mg by mouth daily as needed.     Cholecalciferol (VITAMIN D3) 25 MCG (1000 UT) CAPS Take 5,000 Units by mouth.      clonazePAM (KLONOPIN) 0.5 MG tablet TAKE 1 TABLET BY MOUTH DAILY AS NEEDED FOR ANXIETY 30 tablet 0   hydrocortisone (ANUSOL-HC) 2.5 % rectal cream PLACE 1 APPLICATION RECTALLY 2 TIMES DAILY. 30 g 0   ibuprofen (ADVIL) 200 MG tablet Take 400-600 mg by mouth every 6 (six) hours as needed.     losartan-hydrochlorothiazide (HYZAAR) 50-12.5 MG tablet Take 1 tablet by mouth daily. 90 tablet 1   omeprazole (PRILOSEC) 40 MG capsule Take 1 capsule (40 mg total) by mouth 2 (two) times daily. 180 capsule 1   No current facility-administered medications for this visit.    No Known Allergies  Family History  Problem Relation Age of Onset   Hypertension Mother        alive   Arthritis Mother    Lung cancer Father        deceased   Heart disease Father    Other Other        1 brother and 1 sister both alive an healthy   Hypertension Maternal Grandmother     Social History   Socioeconomic History   Marital status: Widowed    Spouse name: Not on file   Number of children: 3   Years of education: Not on file    Highest education level: Not on file  Occupational History   Occupation: customer service    Employer: [employer]  Tobacco Use   Smoking status: Former    Types: Cigarettes    Quit date: 04/20/1985    Years since quitting: 36.7   Smokeless tobacco: Never   Tobacco comments:    remote tobacco use   Vaping Use   Vaping Use: Never used  Substance and Sexual Activity   Alcohol use: No    Alcohol/week: 0.0 standard drinks of alcohol   Drug use: No   Sexual activity: Not Currently  Other Topics Concern   Not on file  Social History Narrative   Not on file   Social Determinants of Health   Financial Resource Strain: Not on file  Food Insecurity: Not on file  Transportation Needs: Not on file  Physical Activity: Not on file  Stress: Not on file  Social Connections: Not on file  Intimate Partner Violence: Not on file     Constitutional: Denies fever, malaise, fatigue, headache or abrupt weight changes.  Respiratory: Denies difficulty breathing, shortness  of breath, cough or sputum production.   Cardiovascular: Denies chest pain, chest tightness, palpitations or swelling in the hands or feet.  Neurological: Denies dizziness, difficulty with memory, difficulty with speech or problems with balance and coordination.    No other specific complaints in a complete review of systems (except as listed in HPI above).  Objective:   Physical Exam  Wt 221 lb (100.2 kg)   LMP 01/20/2016   BMI 35.67 kg/m   Wt Readings from Last 3 Encounters:  12/15/21 218 lb (98.9 kg)  11/10/21 212 lb (96.2 kg)  10/27/21 213 lb 4 oz (96.7 kg)    General: Appears her stated age, obese, in NAD. Cardiovascular: Normal rate and rhythm. S1,S2 noted.  No murmur, rubs or gallops noted. No JVD or BLE edema.  Pulmonary/Chest: Normal effort and positive vesicular breath sounds. No respiratory distress. No wheezes, rales or ronchi noted.  Neurological: Alert and oriented.    BMET    Component Value  Date/Time   NA 143 12/15/2021 1003   K 3.8 12/15/2021 1003   CL 105 12/15/2021 1003   CO2 25 12/15/2021 1003   GLUCOSE 135 (H) 12/15/2021 1003   BUN 12 12/15/2021 1003   CREATININE 0.64 12/15/2021 1003   CALCIUM 9.2 12/15/2021 1003   GFRNONAA >60 07/02/2021 2000   GFRAA >60 07/27/2017 2153    Lipid Panel     Component Value Date/Time   CHOL 220 (H) 12/15/2021 1003   CHOL 189 07/28/2021 1553   TRIG 88 12/15/2021 1003   TRIG 120 07/28/2021 1553   HDL 23 (L) 12/15/2021 1003   CHOLHDL 9.6 (H) 12/15/2021 1003   VLDL 21.0 02/09/2019 1245   LDLCALC 177 (H) 12/15/2021 1003    CBC    Component Value Date/Time   WBC 3.9 (L) 07/02/2021 2000   RBC 4.73 07/02/2021 2000   HGB 14.1 07/02/2021 2000   HCT 41.5 07/02/2021 2000   PLT 219 07/02/2021 2000   MCV 87.7 07/02/2021 2000   MCH 29.8 07/02/2021 2000   MCHC 34.0 07/02/2021 2000   RDW 13.5 07/02/2021 2000   LYMPHSABS 1.1 07/28/2016 1232   MONOABS 0.4 07/28/2016 1232   EOSABS 0.0 07/28/2016 1232   BASOSABS 0.0 07/28/2016 1232    Hgb A1C Lab Results  Component Value Date   HGBA1C 6.0 (H) 12/15/2021           Assessment & Plan:    RTC in 5 months for your annual exam [name], NP

## 2021-12-30 NOTE — Patient Instructions (Signed)

## 2021-12-30 NOTE — Assessment & Plan Note (Signed)
Encouraged diet and exercise for weight loss ?

## 2022-01-09 ENCOUNTER — Institutional Professional Consult (permissible substitution): Payer: BC Managed Care – PPO | Admitting: Primary Care

## 2022-01-12 ENCOUNTER — Ambulatory Visit: Payer: Self-pay

## 2022-01-12 NOTE — Telephone Encounter (Signed)
  Chief Complaint: COVID+ Symptoms: Cough chills BA, Sinus congestion and drainage. Frequency: Yesterday Pertinent Negatives: Patient denies Fever Disposition: _0 ED /_1 Urgent Care (no appt availability in office) / _2 Appointment(In office/virtual)/ _3  [facility]/ _4 Home Care/ _5 Refused Recommended Disposition /_6 [facility]/ _7  Follow-up with PCP Additional Notes: Pt reports starting s/s  this morning at 2:45 am.  Pt will treat with OTC medications, fluids and rest. Pt will call back if needed. PT in not interested in antivirals.  Summary: covid +   Pt states she tested covid + this morning and inquiring about an otc medication that helps shorten the days of symptoms   Please advise      Reason for Disposition  [1] COVID-19 diagnosed by positive lab test (e.g., PCR, rapid self-test kit) AND [2] mild symptoms (e.g., cough, fever, others) AND [5] no complications or SOB  Answer Assessment - Initial Assessment Questions 1. COVID-19 DIAGNOSIS: "How do you know that you have COVID?" (e.g., positive lab test or self-test, diagnosed by doctor or NP/PA, symptoms after exposure).      2 hours ago - home test 2. COVID-19 EXPOSURE: "Was there any known exposure to COVID before the symptoms began?" CDC Definition of close contact: within 6 feet (2 meters) for a total of 15 minutes or more over a 24-hour period.      no 3. ONSET: "When did the COVID-19 symptoms start?"      Last night 4. WORST SYMPTOM: "What is your worst symptom?" (e.g., cough, fever, shortness of breath, muscle aches)     Chills, BA Cough, Sinus congestion and drainage 5. COUGH: "Do you have a cough?" If Yes, ask: "How bad is the cough?"       Yes 6. FEVER: "Do you have a fever?" If Yes, ask: "What is your temperature, how was it measured, and when did it start?"     Unsure has chills 7. RESPIRATORY STATUS: "Describe your breathing?" (e.g., normal; shortness of breath, wheezing, unable to speak)       no 8. BETTER-SAME-WORSE: "Are you getting better, staying the same or getting worse compared to yesterday?"  If getting worse, ask, "In what way?"     Worse 9. OTHER SYMPTOMS: "Do you have any other symptoms?"  (e.g., chills, fatigue, headache, loss of smell or taste, muscle pain, sore throat)      10. HIGH RISK DISEASE: "Do you have any chronic medical problems?" (e.g., asthma, heart or lung disease, weak immune system, obesity, etc.)       none 11. VACCINE: "Have you had the COVID-19 vaccine?" If Yes, ask: "Which one, how many shots, when did you get it?"        12. PREGNANCY: "Is there any chance you are pregnant?" "When was your last menstrual period?"        13. O2 SATURATION MONITOR:  "Do you use an oxygen saturation monitor (pulse oximeter) at home?" If Yes, ask "What is your reading (oxygen level) today?" "What is your usual oxygen saturation reading?" (e.g., 95%)  Protocols used: Coronavirus (COVID-19) Diagnosed or Suspected-A-AH

## 2022-01-13 ENCOUNTER — Telehealth: Payer: BC Managed Care – PPO | Admitting: Physician Assistant

## 2022-01-13 ENCOUNTER — Ambulatory Visit: Payer: Self-pay | Admitting: *Deleted

## 2022-01-13 DIAGNOSIS — U071 COVID-19: Secondary | ICD-10-CM | POA: Diagnosis not present

## 2022-01-13 MED ORDER — MOLNUPIRAVIR EUA 200MG CAPSULE: 4.0000 | ORAL_CAPSULE | Freq: Two times a day (BID) | ORAL | 0 refills | Status: AC

## 2022-01-13 MED ORDER — FLUTICASONE PROPIONATE 50 MCG/ACT NA SUSP: 2.0000 | Freq: Every day | NASAL | 0 refills | Status: DC

## 2022-01-13 NOTE — Progress Notes (Signed)
Virtual Visit Consent   Diana [name], you are scheduled for a virtual visit with a [facility] provider today. Just as with appointments in the office, your consent must be obtained to participate. Your consent will be active for this visit and any virtual visit you may have with one of our providers in the next 365 days. If you have a MyChart account, a copy of this consent can be sent to you electronically.  As this is a virtual visit, video technology does not allow for your provider to perform a traditional examination. This may limit your provider's ability to fully assess your condition. If your provider identifies any concerns that need to be evaluated in person or the need to arrange testing (such as labs, EKG, etc.), we will make arrangements to do so. Although advances in technology are sophisticated, we cannot ensure that it will always work on either your end or our end. If the connection with a video visit is poor, the visit may have to be switched to a telephone visit. With either a video or telephone visit, we are not always able to ensure that we have a secure connection.  By engaging in this virtual visit, you consent to the provision of healthcare and authorize for your insurance to be billed (if applicable) for the services provided during this visit. Depending on your insurance coverage, you may receive a charge related to this service.  I need to obtain your verbal consent now. Are you willing to proceed with your visit today? SONG STEADY has provided verbal consent on 01/13/2022 for a virtual visit (video or telephone). Diana [name], [place]  Date: 01/13/2022 2:07 PM  Virtual Visit via Video Note   I, Diana [name], connected with  Diana [name]  ([id number], August 06, 1960) on 01/13/22 at  2:00 PM EDT by a video-enabled telemedicine application and verified that I am speaking with the correct person using two identifiers.  Location: Patient: Virtual Visit Location  Patient: Home Provider: Virtual Visit Location Provider: Home Office   I discussed the limitations of evaluation and management by telemedicine and the availability of in person appointments. The patient expressed understanding and agreed to proceed.    History of Present Illness: Diana [name] is a 61 y.o. who identifies as a female who was assigned female at birth, and is being seen today for COVID-19. Symptoms starting yesterday morning with nasal and chest congestion, aches, chills and fatigue. Denies chest pain, SOB or GI symptoms. Tested positive yesterday afternoon. Had COVID before last year and noted it was horrible.   Risk factors: HTN, HLD, Obesity  HPI: HPI  Problems:  Patient Active Problem List   Diagnosis Date Noted   Pure hypercholesterolemia 11/17/2021   Prediabetes 01/02/2021   Class 2 obesity due to excess calories with body mass index (BMI) of 35.0 to 35.9 in adult 11/12/2020   Anxiety and depression 06/08/2019   Chronic constipation 04/26/2019   Intermittent low back pain 10/11/2018   Chronic hepatitis B virus infection ([place]) 11/10/2017   GERD (gastroesophageal reflux disease) 06/09/2016   Essential hypertension 03/07/2015    Allergies: No Known Allergies Medications:  Current Outpatient Medications:    fluticasone (FLONASE) 50 MCG/ACT nasal spray, Place 2 sprays into both nostrils daily., Disp: 16 g, Rfl: 0   molnupiravir EUA (LAGEVRIO) 200 mg CAPS capsule, Take 4 capsules (800 mg total) by mouth 2 (two) times daily for 5 days., Disp: 40 capsule, Rfl: 0   Acetaminophen (TYLENOL  ARTHRITIS PAIN PO), Take by mouth., Disp: , Rfl:    Ascorbic Acid 125 MG CHEW, Chew 1 each by mouth daily., Disp: , Rfl:    atorvastatin (LIPITOR) 10 MG tablet, Take 1 tablet (10 mg total) by mouth daily. (Patient not taking: Reported on 12/30/2021), Disp: 90 tablet, Rfl: 1   cetirizine (ZYRTEC) 10 MG tablet, Take 10 mg by mouth daily as needed., Disp: , Rfl:    Cholecalciferol (VITAMIN  D3) 25 MCG (1000 UT) CAPS, Take 5,000 Units by mouth. , Disp: , Rfl:    clonazePAM (KLONOPIN) 0.5 MG tablet, TAKE 1 TABLET BY MOUTH DAILY AS NEEDED FOR ANXIETY, Disp: 30 tablet, Rfl: 0   hydrocortisone (ANUSOL-HC) 2.5 % rectal cream, PLACE 1 APPLICATION RECTALLY 2 TIMES DAILY., Disp: 30 g, Rfl: 0   ibuprofen (ADVIL) 200 MG tablet, Take 400-600 mg by mouth every 6 (six) hours as needed., Disp: , Rfl:    losartan-hydrochlorothiazide (HYZAAR) 50-12.5 MG tablet, Take 1 tablet by mouth daily., Disp: 90 tablet, Rfl: 1   omeprazole (PRILOSEC) 40 MG capsule, Take 1 capsule (40 mg total) by mouth 2 (two) times daily., Disp: 180 capsule, Rfl: 1  Observations/Objective: Patient is well-developed, well-nourished in no acute distress.  Resting comfortably at home.  Head is normocephalic, atraumatic.  No labored breathing. Speech is clear and coherent with logical content.  Patient is alert and oriented at baseline.   Assessment and Plan: 1. COVID-19 - MyChart COVID-19 home monitoring program; Future - fluticasone (FLONASE) 50 MCG/ACT nasal spray; Place 2 sprays into both nostrils daily.  Dispense: 16 g; Refill: 0 - molnupiravir EUA (LAGEVRIO) 200 mg CAPS capsule; Take 4 capsules (800 mg total) by mouth 2 (two) times daily for 5 days.  Dispense: 40 capsule; Refill: 0  Patient with multiple risk factors for complicated course of illness. Discussed risks/benefits of antiviral medications including most common potential ADRs. Patient voiced understanding and would like to proceed with antiviral medication. They are candidate for Molnupiravir. Rx sent to pharmacy. Supportive measures, OTC medications and vitamin regimen reviewed. Flonase per orders. Patient has been enrolled in a MyChart COVID symptom monitoring program. Samule Dry reviewed in detail. Strict ER precautions discussed with patient.    Follow Up Instructions: I discussed the assessment and treatment plan with the patient. The patient was  provided an opportunity to ask questions and all were answered. The patient agreed with the plan and demonstrated an understanding of the instructions.  A copy of instructions were sent to the patient via MyChart unless otherwise noted below.   The patient was advised to call back or seek an in-person evaluation if the symptoms worsen or if the condition fails to improve as anticipated.  Time:  I spent 10 minutes with the patient via telehealth technology discussing the above problems/concerns.    Diana Rio, PA-C

## 2022-01-13 NOTE — Telephone Encounter (Signed)
Summary: covid congestion   Pt tested positive for covid yesterday and is severely congested / asked for advise of what to take / nose is completely stopped up        Chief Complaint: covid positive requesting medication Symptoms: headache, cough, nasal congestion, difficulty breathing through nose. Body aches, chills. Sx started 01/12/22 covid at home test positive 01/12/22 Frequency: 01/12/22 Pertinent Negatives: Patient denies chest pain no chest congestion. No fever reported Disposition: [] ED /[] Urgent Care (no appt availability in office) / [] Appointment(In office/virtual)/ [x]  [facility]/ [] Home Care/ [] Refused Recommended Disposition /[] [facility]/ []  Follow-up with PCP Additional Notes: [facility] VV scheduled for today .        Reason for Disposition  MILD difficulty breathing (e.g., minimal/no SOB at rest, SOB with walking, pulse <100)  Answer Assessment - Initial Assessment Questions 1. COVID-19 DIAGNOSIS: "How do you know that you have COVID?" (e.g., positive lab test or self-test, diagnosed by doctor or NP/PA, symptoms after exposure).     Covid positive at home test  2. COVID-19 EXPOSURE: "Was there any known exposure to COVID before the symptoms began?" CDC Definition of close contact: within 6 feet (2 meters) for a total of 15 minutes or more over a 24-hour period.      No  3. ONSET: "When did the COVID-19 symptoms start?"      01/12/22 4 am  4. WORST SYMPTOM: "What is your worst symptom?" (e.g., cough, fever, shortness of breath, muscle aches)     Cough , nasal congestion headache chills , body aches  5. COUGH: "Do you have a cough?" If Yes, ask: "How bad is the cough?"       Yes  6. FEVER: "Do you have a fever?" If Yes, ask: "What is your temperature, how was it measured, and when did it start?"     Not sure  7. RESPIRATORY STATUS: "Describe your breathing?" (e.g., normal; shortness of breath, wheezing, unable to speak)      Shortness of breath can  not breath through nose  8. BETTER-SAME-WORSE: "Are you getting better, staying the same or getting worse compared to yesterday?"  If getting worse, ask, "In what way?"     na 9. OTHER SYMPTOMS: "Do you have any other symptoms?"  (e.g., chills, fatigue, headache, loss of smell or taste, muscle pain, sore throat)     Headache, body aches, nasal congestion, cough  10. HIGH RISK DISEASE: "Do you have any chronic medical problems?" (e.g., asthma, heart or lung disease, weak immune system, obesity, etc.)       No  11. VACCINE: "Have you had the COVID-19 vaccine?" If Yes, ask: "Which one, how many shots, when did you get it?"       na 12. PREGNANCY: "Is there any chance you are pregnant?" "When was your last menstrual period?"       na 13. O2 SATURATION MONITOR:  "Do you use an oxygen saturation monitor (pulse oximeter) at home?" If Yes, ask "What is your reading (oxygen level) today?" "What is your usual oxygen saturation reading?" (e.g., 95%)       na  Protocols used: Coronavirus (COVID-19) Diagnosed or Suspected-A-AH

## 2022-01-13 NOTE — Telephone Encounter (Signed)
Noted, agree with advice given 

## 2022-01-13 NOTE — Patient Instructions (Signed)
Ermalene Postin, thank you for joining Piedad Climes, PA-C for today's virtual visit.  While this provider is not your primary care provider (PCP), if your PCP is located in our provider database this encounter information will be shared with them immediately following your visit.  Consent: (Patient) Diana [name] provided verbal consent for this virtual visit at the beginning of the encounter.  Current Medications:  Current Outpatient Medications:    Acetaminophen (TYLENOL ARTHRITIS PAIN PO), Take by mouth., Disp: , Rfl:    Ascorbic Acid 125 MG CHEW, Chew 1 each by mouth daily., Disp: , Rfl:    atorvastatin (LIPITOR) 10 MG tablet, Take 1 tablet (10 mg total) by mouth daily. (Patient not taking: Reported on 12/30/2021), Disp: 90 tablet, Rfl: 1   cetirizine (ZYRTEC) 10 MG tablet, Take 10 mg by mouth daily as needed., Disp: , Rfl:    Cholecalciferol (VITAMIN D3) 25 MCG (1000 UT) CAPS, Take 5,000 Units by mouth. , Disp: , Rfl:    clonazePAM (KLONOPIN) 0.5 MG tablet, TAKE 1 TABLET BY MOUTH DAILY AS NEEDED FOR ANXIETY, Disp: 30 tablet, Rfl: 0   hydrocortisone (ANUSOL-HC) 2.5 % rectal cream, PLACE 1 APPLICATION RECTALLY 2 TIMES DAILY., Disp: 30 g, Rfl: 0   ibuprofen (ADVIL) 200 MG tablet, Take 400-600 mg by mouth every 6 (six) hours as needed., Disp: , Rfl:    losartan-hydrochlorothiazide (HYZAAR) 50-12.5 MG tablet, Take 1 tablet by mouth daily., Disp: 90 tablet, Rfl: 1   omeprazole (PRILOSEC) 40 MG capsule, Take 1 capsule (40 mg total) by mouth 2 (two) times daily., Disp: 180 capsule, Rfl: 1   Medications ordered in this encounter:  No orders of the defined types were placed in this encounter.    *If you need refills on other medications prior to your next appointment, please contact your pharmacy*  Follow-Up: Call back or seek an in-person evaluation if the symptoms worsen or if the condition fails to improve as anticipated.  [facility] [phone]  Other  Instructions Please keep well-hydrated and get plenty of rest. Start a saline nasal rinse to flush out your nasal passages. You can use plain Mucinex to help thin congestion. If you have a humidifier, running in the bedroom at night. I want you to start OTC vitamin D3 1000 units daily, vitamin C 1000 mg daily, and a zinc supplement. Please take prescribed medications as directed.  You have been enrolled in a MyChart symptom monitoring program. Please answer these questions daily so we can keep track of how you are doing.  You were to quarantine for 5 days from onset of your symptoms.  After day 5, if you have had no fever and you are feeling better, you can end quarantine but need to mask for an additional 5 days. After day 5 if you have a fever or are having significant symptoms, please quarantine for full 10 days.  If you note any worsening of symptoms, any significant shortness of breath or any chest pain, please seek ER evaluation ASAP.  Please do not delay care!  COVID-19: What to Do if You Are Sick If you test positive and are an older adult or someone who is at high risk of getting very sick from COVID-19, treatment may be available. Contact a healthcare provider right away after a positive test to determine if you are eligible, even if your symptoms are mild right now. You can also visit a Test to Treat location and, if eligible, receive a  prescription from a provider. Don't delay: Treatment must be started within the first few days to be effective. If you have a fever, cough, or other symptoms, you might have COVID-19. Most people have mild illness and are able to recover at home. If you are sick: Keep track of your symptoms. If you have an emergency warning sign (including trouble breathing), call 911. Steps to help prevent the spread of COVID-19 if you are sick If you are sick with COVID-19 or think you might have COVID-19, follow the steps below to care for yourself and to help  protect other people in your home and community. Stay home except to get medical care Stay home. Most people with COVID-19 have mild illness and can recover at home without medical care. Do not leave your home, except to get medical care. Do not visit public areas and do not go to places where you are unable to wear a mask. Take care of yourself. Get rest and stay hydrated. Take over-the-counter medicines, such as acetaminophen, to help you feel better. Stay in touch with your doctor. Call before you get medical care. Be sure to get care if you have trouble breathing, or have any other emergency warning signs, or if you think it is an emergency. Avoid public transportation, ride-sharing, or taxis if possible. Get tested If you have symptoms of COVID-19, get tested. While waiting for test results, stay away from others, including staying apart from those living in your household. Get tested as soon as possible after your symptoms start. Treatments may be available for people with COVID-19 who are at risk for becoming very sick. Don't delay: Treatment must be started early to be effective--some treatments must begin within 5 days of your first symptoms. Contact your healthcare provider right away if your test result is positive to determine if you are eligible. Self-tests are one of several options for testing for the virus that causes COVID-19 and may be more convenient than laboratory-based tests and point-of-care tests. Ask your healthcare provider or your local health department if you need help interpreting your test results. You can visit your state, tribal, local, and territorial health department's website to look for the latest local information on testing sites. Separate yourself from other people As much as possible, stay in a specific room and away from other people and pets in your home. If possible, you should use a separate bathroom. If you need to be around other people or animals in or  outside of the home, wear a well-fitting mask. Tell your close contacts that they may have been exposed to COVID-19. An infected person can spread COVID-19 starting 48 hours (or 2 days) before the person has any symptoms or tests positive. By letting your close contacts know they may have been exposed to COVID-19, you are helping to protect everyone. See COVID-19 and Animals if you have questions about pets. If you are diagnosed with COVID-19, someone from the health department may call you. Answer the call to slow the spread. Monitor your symptoms Symptoms of COVID-19 include fever, cough, or other symptoms. Follow care instructions from your healthcare provider and local health department. Your local health authorities may give instructions on checking your symptoms and reporting information. When to seek emergency medical attention Look for emergency warning signs* for COVID-19. If someone is showing any of these signs, seek emergency medical care immediately: Trouble breathing Persistent pain or pressure in the chest New confusion Inability to wake or stay awake Pale, gray,  or blue-colored skin, lips, or nail beds, depending on skin tone *This list is not all possible symptoms. Please call your medical provider for any other symptoms that are severe or concerning to you. Call 911 or call ahead to your local emergency facility: Notify the operator that you are seeking care for someone who has or may have COVID-19. Call ahead before visiting your doctor Call ahead. Many medical visits for routine care are being postponed or done by phone or telemedicine. If you have a medical appointment that cannot be postponed, call your doctor's office, and tell them you have or may have COVID-19. This will help the office protect themselves and other patients. If you are sick, wear a well-fitting mask You should wear a mask if you must be around other people or animals, including pets (even at home). Wear  a mask with the best fit, protection, and comfort for you. You don't need to wear the mask if you are alone. If you can't put on a mask (because of trouble breathing, for example), cover your coughs and sneezes in some other way. Try to stay at least 6 feet away from other people. This will help protect the people around you. Masks should not be placed on young children under age 49 years, anyone who has trouble breathing, or anyone who is not able to remove the mask without help. Cover your coughs and sneezes Cover your mouth and nose with a tissue when you cough or sneeze. Throw away used tissues in a lined trash can. Immediately wash your hands with soap and water for at least 20 seconds. If soap and water are not available, clean your hands with an alcohol-based hand sanitizer that contains at least 60% alcohol. Clean your hands often Wash your hands often with soap and water for at least 20 seconds. This is especially important after blowing your nose, coughing, or sneezing; going to the bathroom; and before eating or preparing food. Use hand sanitizer if soap and water are not available. Use an alcohol-based hand sanitizer with at least 60% alcohol, covering all surfaces of your hands and rubbing them together until they feel dry. Soap and water are the best option, especially if hands are visibly dirty. Avoid touching your eyes, nose, and mouth with unwashed hands. Handwashing Tips Avoid sharing personal household items Do not share dishes, drinking glasses, cups, eating utensils, towels, or bedding with other people in your home. Wash these items thoroughly after using them with soap and water or put in the dishwasher. Clean surfaces in your home regularly Clean and disinfect high-touch surfaces (for example, doorknobs, tables, handles, light switches, and countertops) in your "sick room" and bathroom. In shared spaces, you should clean and disinfect surfaces and items after each use by the  person who is ill. If you are sick and cannot clean, a caregiver or other person should only clean and disinfect the area around you (such as your bedroom and bathroom) on an as needed basis. Your caregiver/other person should wait as long as possible (at least several hours) and wear a mask before entering, cleaning, and disinfecting shared spaces that you use. Clean and disinfect areas that may have blood, stool, or body fluids on them. Use household cleaners and disinfectants. Clean visible dirty surfaces with household cleaners containing soap or detergent. Then, use a household disinfectant. Use a product from [employer] List N: Disinfectants for Coronavirus ([id number]). Be sure to follow the instructions on the label to ensure safe and effective  use of the product. Many products recommend keeping the surface wet with a disinfectant for a certain period of time (look at "contact time" on the product label). You may also need to wear personal protective equipment, such as gloves, depending on the directions on the product label. Immediately after disinfecting, wash your hands with soap and water for 20 seconds. For completed guidance on cleaning and disinfecting your home, visit Complete Disinfection Guidance. Take steps to improve ventilation at home Improve ventilation (air flow) at home to help prevent from spreading COVID-19 to other people in your household. Clear out COVID-19 virus particles in the air by opening windows, using air filters, and turning on fans in your home. Use this interactive tool to learn how to improve air flow in your home. When you can be around others after being sick with COVID-19 Deciding when you can be around others is different for different situations. Find out when you can safely end home isolation. For any additional questions about your care, contact your healthcare provider or state or local health department. 07/09/2020 Content source: [facility] for  Immunization and Respiratory Diseases ([facility]), Division of Viral Diseases This information is not intended to replace advice given to you by your health care provider. Make sure you discuss any questions you have with your health care provider. Document Revised: 08/22/2020 Document Reviewed: 08/22/2020 Elsevier Patient Education  2022 [employer].      If you have been instructed to have an in-person evaluation today at a local Urgent Care facility, please use the link below. It will take you to a list of all of our available [facility], including address, phone number and hours of operation. Please do not delay care.  [facility]  If you or a family member do not have a primary care provider, use the link below to schedule a visit and establish care. When you choose a [facility] primary care physician or advanced practice provider, you gain a long-term partner in health. Find a Primary Care Provider  Learn more about [facility]'s in-office and virtual care options: [facility] - Get Care Now

## 2022-01-19 ENCOUNTER — Institutional Professional Consult (permissible substitution): Payer: BC Managed Care – PPO | Admitting: Primary Care

## 2022-01-26 ENCOUNTER — Ambulatory Visit (INDEPENDENT_AMBULATORY_CARE_PROVIDER_SITE_OTHER): Payer: BC Managed Care – PPO | Admitting: Internal Medicine

## 2022-01-26 ENCOUNTER — Encounter: Payer: Self-pay | Admitting: Internal Medicine

## 2022-01-26 VITALS — BP 142/94 | HR 67 | Temp 96.9°F | Wt 220.0 lb

## 2022-01-26 DIAGNOSIS — N390 Urinary tract infection, site not specified: Secondary | ICD-10-CM | POA: Diagnosis not present

## 2022-01-26 DIAGNOSIS — Z6835 Body mass index (BMI) 35.0-35.9, adult: Secondary | ICD-10-CM

## 2022-01-26 DIAGNOSIS — R351 Nocturia: Secondary | ICD-10-CM

## 2022-01-26 DIAGNOSIS — M546 Pain in thoracic spine: Secondary | ICD-10-CM

## 2022-01-26 LAB — POCT URINALYSIS DIPSTICK
Bilirubin, UA: NEGATIVE
Glucose, UA: NEGATIVE
Ketones, UA: NEGATIVE
Leukocytes, UA: NEGATIVE
Nitrite, UA: NEGATIVE
Protein, UA: NEGATIVE
Spec Grav, UA: 1.01
Urobilinogen, UA: 0.2 U/dL
pH, UA: 6

## 2022-01-26 MED ORDER — METHOCARBAMOL 500 MG PO TABS: 500.0000 mg | ORAL_TABLET | Freq: Three times a day (TID) | ORAL | 0 refills | Status: DC | PRN

## 2022-01-26 NOTE — Progress Notes (Signed)
Subjective:    Patient ID: DE LIBMAN, female    DOB: 06/22/1960, 61 y.o.   MRN: [id number]  HPI  Patient presents to clinic today with complaint of back pain. She reports this pain is in her upper back and started after getting covid 2 weeks ago. She describes the pain sore and achy, but it can be sharp with certain movements. She reports the pain is constant but is worse throughout the day. She denies numbness, tingling, or burning of the upper back but she does reports some weakness in the area. She denies cough, chest congestion, shortness for breath or chest pain. She has had an xray of  thoracic spine 06/2021 which was normal.  She has a history of chronic low back pain but she reports this pain is different.  X-ray lumbar spine from 2019 showed:  IMPRESSION: Mild degenerative change without acute abnormality.   She takes Ibuprofen, Tylenol and Tumeric OTC with minimal relief of symptoms.   She also reports nocturia. This started last night. She urgency, frequency, dysuria or blood in her urine. She denies fever, chills, nausea and vomiting. She has not tried anything OTC for this.  Review of Systems     Past Medical History:  Diagnosis Date   Allergy    Ear infection    recurrent    Current Outpatient Medications  Medication Sig Dispense Refill   Acetaminophen (TYLENOL ARTHRITIS PAIN PO) Take by mouth.     Ascorbic Acid 125 MG CHEW Chew 1 each by mouth daily.     atorvastatin (LIPITOR) 10 MG tablet Take 1 tablet (10 mg total) by mouth daily. (Patient not taking: Reported on 12/30/2021) 90 tablet 1   cetirizine (ZYRTEC) 10 MG tablet Take 10 mg by mouth daily as needed.     Cholecalciferol (VITAMIN D3) 25 MCG (1000 UT) CAPS Take 5,000 Units by mouth.      clonazePAM (KLONOPIN) 0.5 MG tablet TAKE 1 TABLET BY MOUTH DAILY AS NEEDED FOR ANXIETY 30 tablet 0   fluticasone (FLONASE) 50 MCG/ACT nasal spray Place 2 sprays into both nostrils daily. 16 g 0   hydrocortisone  (ANUSOL-HC) 2.5 % rectal cream PLACE 1 APPLICATION RECTALLY 2 TIMES DAILY. 30 g 0   ibuprofen (ADVIL) 200 MG tablet Take 400-600 mg by mouth every 6 (six) hours as needed.     losartan-hydrochlorothiazide (HYZAAR) 50-12.5 MG tablet Take 1 tablet by mouth daily. 90 tablet 1   omeprazole (PRILOSEC) 40 MG capsule Take 1 capsule (40 mg total) by mouth 2 (two) times daily. 180 capsule 1   No current facility-administered medications for this visit.    No Known Allergies  Family History  Problem Relation Age of Onset   Hypertension Mother        alive   Arthritis Mother    Lung cancer Father        deceased   Heart disease Father    Other Other        1 brother and 1 sister both alive an healthy   Hypertension Maternal Grandmother     Social History   Socioeconomic History   Marital status: Widowed    Spouse name: Not on file   Number of children: 3   Years of education: Not on file   Highest education level: Not on file  Occupational History   Occupation: customer service    Employer: [employer]  Tobacco Use   Smoking status: Former    Types: Cigarettes  Quit date: 04/20/1985    Years since quitting: 36.7   Smokeless tobacco: Never   Tobacco comments:    remote tobacco use   Vaping Use   Vaping Use: Never used  Substance and Sexual Activity   Alcohol use: No    Alcohol/week: 0.0 standard drinks of alcohol   Drug use: No   Sexual activity: Not Currently  Other Topics Concern   Not on file  Social History Narrative   Not on file   Social Determinants of Health   Financial Resource Strain: Not on file  Food Insecurity: Not on file  Transportation Needs: Not on file  Physical Activity: Not on file  Stress: Not on file  Social Connections: Not on file  Intimate Partner Violence: Not on file     Constitutional: Denies fever, malaise, fatigue, headache or abrupt weight changes.  Respiratory: Denies difficulty breathing, shortness of breath, cough or sputum  production.   Cardiovascular: Denies chest pain, chest tightness, palpitations or swelling in the hands or feet.  Gastrointestinal: Denies abdominal pain, bloating, constipation, diarrhea or blood in the stool.  GU: Patient reports nocturia.  Denies urgency, frequency, pain with urination, burning sensation, blood in urine, odor or discharge. Musculoskeletal: Pt reports upper back pain, chronic low back pain. Denies decrease in range of motion, difficulty with gait, muscle pain  Neurological: Patient reports weakness of the upper back.  Denies numbness, tingling, burning or problems with balance and coordination.    No other specific complaints in a complete review of systems (except as listed in HPI above).  Objective:   Physical Exam  BP (!) 142/94 (BP Location: Right Arm, Patient Position: Sitting, Cuff Size: Normal)   Pulse 67   Temp (!) 96.9 F (36.1 C) (Temporal)   Wt 220 lb (99.8 kg)   LMP 01/20/2016   SpO2 98%   BMI 35.51 kg/m   Wt Readings from Last 3 Encounters:  12/30/21 221 lb (100.2 kg)  12/15/21 218 lb (98.9 kg)  11/10/21 212 lb (96.2 kg)    General: Appears her stated age, obese, in NAD. Skin: Warm, dry and intact. No rashesnoted. HEENT: Head: normal shape and size; Eyes: sclera white, no icterus, conjunctiva pink, PERRLA and EOMs intact;  Cardiovascular: Normal rate and rhythm. S1,S2 noted.  No murmur, rubs or gallops noted.  Pulmonary/Chest: Normal effort and positive vesicular breath sounds. No respiratory distress. No wheezes, rales or ronchi noted.  Abdomen: No CVA tenderness noted. Musculoskeletal: Decreased flexion of the spine.  Normal extension, rotation and lateral bending.  No bony tenderness noted over the thoracic spine or parathoracic muscles.  Strength 5/5 BUE.  Handgrips equal.  No difficulty with gait.  Neurological: Alert and oriented.    BMET    Component Value Date/Time   NA 143 12/15/2021 1003   K 3.8 12/15/2021 1003   CL 105 12/15/2021  1003   CO2 25 12/15/2021 1003   GLUCOSE 135 (H) 12/15/2021 1003   BUN 12 12/15/2021 1003   CREATININE 0.64 12/15/2021 1003   CALCIUM 9.2 12/15/2021 1003   GFRNONAA >60 07/02/2021 2000   GFRAA >60 07/27/2017 2153    Lipid Panel     Component Value Date/Time   CHOL 220 (H) 12/15/2021 1003   CHOL 189 07/28/2021 1553   TRIG 88 12/15/2021 1003   TRIG 120 07/28/2021 1553   HDL 23 (L) 12/15/2021 1003   CHOLHDL 9.6 (H) 12/15/2021 1003   VLDL 21.0 02/09/2019 1245   LDLCALC 177 (H) 12/15/2021 1003  CBC    Component Value Date/Time   WBC 3.9 (L) 07/02/2021 2000   RBC 4.73 07/02/2021 2000   HGB 14.1 07/02/2021 2000   HCT 41.5 07/02/2021 2000   PLT 219 07/02/2021 2000   MCV 87.7 07/02/2021 2000   MCH 29.8 07/02/2021 2000   MCHC 34.0 07/02/2021 2000   RDW 13.5 07/02/2021 2000   LYMPHSABS 1.1 07/28/2016 1232   MONOABS 0.4 07/28/2016 1232   EOSABS 0.0 07/28/2016 1232   BASOSABS 0.0 07/28/2016 1232    Hgb A1C Lab Results  Component Value Date   HGBA1C 6.0 (H) 12/15/2021            Assessment & Plan:   Nocturia:  Urinalysis: 2+ blood Will send urine culture Push water  Thoracic Back Pain:  Concern for muscle inflammation status post COVID We will check ESR, CRP and CK today Continue Tylenol, Ibuprofen and Turmeric Rx for Methocarbamol 500 mg every 8 hours as needed-sedation caution given Heat and ice have not been helpful Encouraged massage  We will follow-up after labs with further recommendation and treatment plan, RTC in 4 months for your annual exam Webb Silversmith, NP

## 2022-01-26 NOTE — Patient Instructions (Signed)

## 2022-01-26 NOTE — Addendum Note (Signed)
Addended by: Ashley Royalty E on: 01/26/2022 11:40 AM   Modules accepted: Orders

## 2022-01-26 NOTE — Assessment & Plan Note (Signed)
Encourage weight loss as this can help reduce back pain 

## 2022-01-27 ENCOUNTER — Encounter: Payer: Self-pay | Admitting: Internal Medicine

## 2022-01-27 LAB — URINE CULTURE
MICRO NUMBER:: 14025660
SPECIMEN QUALITY:: ADEQUATE

## 2022-01-27 LAB — SEDIMENTATION RATE: Sed Rate: 17 mm/h (ref 0–30)

## 2022-01-27 LAB — C-REACTIVE PROTEIN: CRP: 1.9 mg/L (ref <8.0)

## 2022-01-27 LAB — CK: Total CK: 91 U/L (ref 29–143)

## 2022-02-02 ENCOUNTER — Ambulatory Visit (INDEPENDENT_AMBULATORY_CARE_PROVIDER_SITE_OTHER): Payer: BC Managed Care – PPO | Admitting: Primary Care

## 2022-02-02 ENCOUNTER — Encounter: Payer: Self-pay | Admitting: Primary Care

## 2022-02-02 VITALS — BP 124/86 | HR 75 | Ht 67.0 in | Wt 221.4 lb

## 2022-02-02 DIAGNOSIS — G4719 Other hypersomnia: Secondary | ICD-10-CM | POA: Insufficient documentation

## 2022-02-02 NOTE — Assessment & Plan Note (Addendum)
-   Patient has symptoms of excessive daytime sleepiness.  She is unsure if she snores.  Epworth score is 8.  No overt signs of narcolepsy. Concern patient could have underlying obstructive sleep apnea, she will need home sleep study to rule out sleep apnea as cause of fatigue.  We discussed risks of untreated sleep apnea including cardiac arrhythmias, pulmonary hypertension, diabetes or stroke.  We briefly reviewed treatment options including oral appliance, CPAP therapy or referral to ENT for possible surgical options. Advised patient work on weight loss efforts, aim to get 6 to 8 hours of sleep at night, focus on side sleeping position,  recommend 20-minute daily nap, trial caffeine pills 200 mg every 4-6 hours as needed for energy (advised patient if BP/HR elevated to discontinue and notify office).  If sleep study is negative for OSA patient should follow-up with primary care to further work-up fatigue symptoms, specifically possible fibromyalgia.

## 2022-02-02 NOTE — Progress Notes (Signed)
@Patient  ID: , female    DOB: 1960-11-08, 61 y.o.   MRN: [id number]  Chief Complaint  Patient presents with   Consult    Referring provider: [id number], NP  HPI: 61 year old female, former smoker quit 1987.  Past medical history significant for hypertension, GERD, chronic hepatitis B, obesity, prediabetes, anxiety/depression.  02/02/2022 Patient presents today for sleep consult. She reports symptoms of daytime sleepiness. Symptoms were worse several months ago, she was dosing off at work frequently but this has recently improved last couple of weeks. She is unsure if she snores. She gets about 5-6 hours of sleep a night. She does not experience sudden sleep attacks. She does not take klonopin regularly. She had a sleep study several years ago which she reports was negative for sleep apnea. Weight is up 25 lbs. No breathing issues, cough, narcolepsy, cataplexy or sleep walking.   Sleep questionnaire Symptoms- Tired/sleepy  Prior sleep study- several years ago  Time to fall asleep- not long  Nocturnal awakenings- several times a night  Out of bed/start of day- 4am (5 days a week) Weight changes- up 25 lbs  Do you operate heavy machinery- No Do you currently wear CPAP- No Do you current wear oxygen- No  Epworth- 8   No Known Allergies  Immunization History  Administered Date(s) Administered   Tdap 04/16/2015    Past Medical History:  Diagnosis Date   Allergy    Ear infection    recurrent    Tobacco History: Social History   Tobacco Use  Smoking Status Former   Types: Cigarettes   Quit date: 04/20/1985   Years since quitting: 36.8  Smokeless Tobacco Never  Tobacco Comments   remote tobacco use    Counseling given: Not Answered Tobacco comments: remote tobacco use    Outpatient Medications Prior to Visit  Medication Sig Dispense Refill   Acetaminophen (TYLENOL ARTHRITIS PAIN PO) Take by mouth.     Ascorbic Acid 125 MG CHEW Chew 1 each by  mouth daily.     atorvastatin (LIPITOR) 10 MG tablet Take 1 tablet (10 mg total) by mouth daily. (Patient taking differently: Take 5 mg by mouth daily. Three times a week.) 90 tablet 1   cetirizine (ZYRTEC) 10 MG tablet Take 10 mg by mouth daily as needed.     Cholecalciferol (VITAMIN D3) 25 MCG (1000 UT) CAPS Take 5,000 Units by mouth.      clonazePAM (KLONOPIN) 0.5 MG tablet TAKE 1 TABLET BY MOUTH DAILY AS NEEDED FOR ANXIETY 30 tablet 0   fluticasone (FLONASE) 50 MCG/ACT nasal spray Place 2 sprays into both nostrils daily. 16 g 0   hydrocortisone (ANUSOL-HC) 2.5 % rectal cream PLACE 1 APPLICATION RECTALLY 2 TIMES DAILY. 30 g 0   ibuprofen (ADVIL) 200 MG tablet Take 400-600 mg by mouth every 6 (six) hours as needed.     losartan-hydrochlorothiazide (HYZAAR) 50-12.5 MG tablet Take 1 tablet by mouth daily. 90 tablet 1   methocarbamol (ROBAXIN) 500 MG tablet Take 1 tablet (500 mg total) by mouth every 8 (eight) hours as needed for muscle spasms. 20 tablet 0   omeprazole (PRILOSEC) 40 MG capsule Take 1 capsule (40 mg total) by mouth 2 (two) times daily. 180 capsule 1   No facility-administered medications prior to visit.   Review of Systems  Review of Systems  Constitutional:  Positive for fatigue.  HENT: Negative.    Respiratory:  Negative for cough, shortness of breath and wheezing.   Psychiatric/Behavioral:  Positive for sleep disturbance.     Physical Exam  BP 124/86 (BP Location: Left Arm, Cuff Size: Normal)   Pulse 75   Ht 5\' 7"  (1.702 m)   Wt 221 lb 6.4 oz (100.4 kg)   LMP 01/20/2016   SpO2 95%   BMI 34.68 kg/m  Physical Exam Constitutional:      Appearance: Normal appearance.  HENT:     Head: Normocephalic and atraumatic.  Cardiovascular:     Rate and Rhythm: Normal rate and regular rhythm.  Pulmonary:     Effort: Pulmonary effort is normal.     Breath sounds: Normal breath sounds.  Musculoskeletal:     Cervical back: Normal range of motion.  Skin:    General: Skin  is warm and dry.  Neurological:     General: No focal deficit present.     Mental Status: She is alert and oriented to person, place, and time. Mental status is at baseline.  Psychiatric:        Mood and Affect: Mood normal.        Behavior: Behavior normal.        Thought Content: Thought content normal.        Judgment: Judgment normal.      Lab Results:  CBC    Component Value Date/Time   WBC 3.9 (L) 07/02/2021 2000   RBC 4.73 07/02/2021 2000   HGB 14.1 07/02/2021 2000   HCT 41.5 07/02/2021 2000   PLT 219 07/02/2021 2000   MCV 87.7 07/02/2021 2000   MCH 29.8 07/02/2021 2000   MCHC 34.0 07/02/2021 2000   RDW 13.5 07/02/2021 2000   LYMPHSABS 1.1 07/28/2016 1232   MONOABS 0.4 07/28/2016 1232   EOSABS 0.0 07/28/2016 1232   BASOSABS 0.0 07/28/2016 1232    BMET    Component Value Date/Time   NA 143 12/15/2021 1003   K 3.8 12/15/2021 1003   CL 105 12/15/2021 1003   CO2 25 12/15/2021 1003   GLUCOSE 135 (H) 12/15/2021 1003   BUN 12 12/15/2021 1003   CREATININE 0.64 12/15/2021 1003   CALCIUM 9.2 12/15/2021 1003   GFRNONAA >60 07/02/2021 2000   GFRAA >60 07/27/2017 2153    BNP No results found for: "BNP"  ProBNP No results found for: "PROBNP"  Imaging: No results found.   Assessment & Plan:   Excessive daytime sleepiness - Patient has symptoms of excessive daytime sleepiness.  She is unsure if she snores.  Epworth score is 8.  No overt signs of narcolepsy. Concern patient could have underlying obstructive sleep apnea, she will need home sleep study to rule out sleep apnea as cause of fatigue.  We discussed risks of untreated sleep apnea including cardiac arrhythmias, pulmonary hypertension, diabetes or stroke.  We briefly reviewed treatment options including oral appliance, CPAP therapy or referral to ENT for possible surgical options. Advised patient work on weight loss efforts, aim to get 6 to 8 hours of sleep at night, focus on side sleeping position,  recommend  20-minute daily nap, trial caffeine pills 200 mg every 4-6 hours as needed for energy (advised patient if BP/HR elevated to discontinue and notify office).  If sleep study is negative for OSA patient should follow-up with primary care to further work-up fatigue symptoms, specifically possible fibromyalgia.    Martyn Ehrich, NP 02/02/2022

## 2022-02-02 NOTE — Patient Instructions (Addendum)
We will check home sleep test to rule out sleep apnea as cause for excessive daytime sleepiness, if testing is negative recommend discussing other causes of daytime fatigue with PCP specifically fibromyalgia   Recommendations: Work on weight loss efforts as able  Get 6-8 hours of sleep a night Try fitting in a 20 min nap during the day if able  Try caffeine pills - take 200mg  every 4-6 hours for energy (monitor BP and HR, discontinue if elevated) Get some physical exercise daily  Focus on side sleeping positive or elevate head of bed 30 degrees Do not drive if experiencing excessive daytime sleepiness   Follow-up: Please call 1-2 weeks after sleep study for results/ if you have sleep apnea we can do virtual visit to discuss treatment options

## 2022-02-04 NOTE — Progress Notes (Signed)
Reviewed and agree with assessment/plan.   Corinthia Helmers, MD [facility] 02/04/2022, 9:18 AM Pager:  [phone]  

## 2022-02-05 ENCOUNTER — Telehealth: Payer: Self-pay

## 2022-02-05 DIAGNOSIS — M791 Myalgia, unspecified site: Secondary | ICD-10-CM

## 2022-02-05 NOTE — Telephone Encounter (Signed)
Copied from [place] [id number]. Topic: General - Inquiry >> Feb 05, 2022 10:42 AM Penni Bombard wrote: Reason for CRM: Pt called saying [name] filled out a paper for leave of absence for work and questions 6 and 7 are incorrect.  She is to be on intermediate fmla not continual leave.  This needs to be corrected and sent back.  CB@  [phone]

## 2022-02-05 NOTE — Telephone Encounter (Signed)
Pt advised.  She is going to check with Howell Rucks.    She would also like a new referral to rheumatology for possible fibromyalgia.  She was referred to [facility] in the past but she canceled her appointment too many times so they will not reschedule her.     Thanks,   -Mickel Baas

## 2022-02-05 NOTE — Telephone Encounter (Signed)
I filled out 2 separate forms.  She was on continuous leave during her period of absence when she had COVID.  Then I filled out a separate form for intermittent leave for her back pain.

## 2022-02-05 NOTE — Addendum Note (Signed)
Addended by: Ashley Royalty E on: 02/05/2022 04:48 PM   Modules accepted: Orders

## 2022-02-06 NOTE — Addendum Note (Signed)
Addended by: Jearld Fenton on: 02/06/2022 08:38 AM   Modules accepted: Orders

## 2022-02-06 NOTE — Telephone Encounter (Signed)
Referral placed.

## 2022-03-23 ENCOUNTER — Other Ambulatory Visit: Payer: BC Managed Care – PPO

## 2022-03-27 ENCOUNTER — Other Ambulatory Visit: Payer: Self-pay | Admitting: Internal Medicine

## 2022-03-27 DIAGNOSIS — K219 Gastro-esophageal reflux disease without esophagitis: Secondary | ICD-10-CM

## 2022-03-27 NOTE — Telephone Encounter (Signed)
Requested medication (s) are due for refill today: yes  Requested medication (s) are on the active medication list: yes  Last refill:  11/21/21  Future visit scheduled: yes  Notes to clinic:  Unable to refill per protocol, cannot delegate.      Requested Prescriptions  Pending Prescriptions Disp Refills   clonazePAM (KLONOPIN) 0.5 MG tablet [Pharmacy Med Name: CLONAZEPAM 0.5 MG TABLET 0.5 Tablet] 30 tablet 0    Sig: TAKE 1 TABLET BY MOUTH DAILY AS NEEDED FOR ANXIETY     Not Delegated - Psychiatry: Anxiolytics/Hypnotics 2 Failed - 03/27/2022  1:50 PM      Failed - This refill cannot be delegated      Failed - Urine Drug Screen completed in last 360 days      Passed - Patient is not pregnant      Passed - Valid encounter within last 6 months    Recent Outpatient Visits           2 months ago Acute bilateral thoracic back pain   [facility] [place], Salvadore Oxford, NP   2 months ago Essential hypertension   [facility] [place], Salvadore Oxford, NP   3 months ago Prediabetes   [facility] [place], Salvadore Oxford, NP   5 months ago Tick bite of neck, initial encounter   [facility] [place], Salvadore Oxford, NP   7 months ago Pelvic pain   [facility] [place], Salvadore Oxford, [place]

## 2022-04-03 ENCOUNTER — Other Ambulatory Visit: Payer: Self-pay

## 2022-04-03 DIAGNOSIS — E7800 Pure hypercholesterolemia, unspecified: Secondary | ICD-10-CM

## 2022-04-06 ENCOUNTER — Other Ambulatory Visit: Payer: BC Managed Care – PPO

## 2022-04-07 ENCOUNTER — Other Ambulatory Visit: Payer: BC Managed Care – PPO

## 2022-04-09 ENCOUNTER — Other Ambulatory Visit: Payer: BC Managed Care – PPO

## 2022-04-14 ENCOUNTER — Other Ambulatory Visit: Payer: BC Managed Care – PPO

## 2022-04-21 ENCOUNTER — Encounter: Payer: Self-pay | Admitting: Internal Medicine

## 2022-04-21 ENCOUNTER — Ambulatory Visit: Payer: BC Managed Care – PPO | Admitting: Internal Medicine

## 2022-04-21 VITALS — BP 118/65 | HR 79 | Temp 96.8°F | Wt 230.0 lb

## 2022-04-21 DIAGNOSIS — R0981 Nasal congestion: Secondary | ICD-10-CM | POA: Diagnosis not present

## 2022-04-21 DIAGNOSIS — M549 Dorsalgia, unspecified: Secondary | ICD-10-CM | POA: Diagnosis not present

## 2022-04-21 DIAGNOSIS — G5701 Lesion of sciatic nerve, right lower limb: Secondary | ICD-10-CM

## 2022-04-21 LAB — POCT INFLUENZA A/B
Influenza A, POC: NEGATIVE
Influenza B, POC: NEGATIVE

## 2022-04-21 LAB — POC COVID19 BINAXNOW: SARS Coronavirus 2 Ag: NEGATIVE

## 2022-04-21 MED ORDER — METHOCARBAMOL 500 MG PO TABS: 500.0000 mg | ORAL_TABLET | Freq: Three times a day (TID) | ORAL | 0 refills | Status: DC | PRN

## 2022-04-21 NOTE — Progress Notes (Signed)
Subjective:    Patient ID: Diana [name], female    DOB: December 07, 1960, 62 y.o.   MRN: [id number]  HPI  Patient presents to clinic today with complaint of nasal congestion. This started 5 days ago. She is not able to blow anything out of her nose. She denies headache, runny nose, ear pain, sore throat, or cough. She denies fever, chills or body aches. She has not taken anything OTC for this. She has had sick contacts diagnosed with covid and flu.  She also reports mid back pain and pain in her right buttocks.  This has been an ongoing issue.  She describes the pain as sore and achy.  The pain can be sharp and stabbing at time.  She denies numbness, tingling or weakness of her lower extremities.  She denies loss of bowel or bladder control.  She is taken Ibuprofen and Tylenol OTC with some relief of symptoms.  Review of Systems     Past Medical History:  Diagnosis Date   Allergy    Ear infection    recurrent    Current Outpatient Medications  Medication Sig Dispense Refill   Acetaminophen (TYLENOL ARTHRITIS PAIN PO) Take by mouth.     Ascorbic Acid 125 MG CHEW Chew 1 each by mouth daily.     atorvastatin (LIPITOR) 10 MG tablet Take 1 tablet (10 mg total) by mouth daily. (Patient taking differently: Take 5 mg by mouth daily. Three times a week.) 90 tablet 1   cetirizine (ZYRTEC) 10 MG tablet Take 10 mg by mouth daily as needed.     Cholecalciferol (VITAMIN D3) 25 MCG (1000 UT) CAPS Take 5,000 Units by mouth.      clonazePAM (KLONOPIN) 0.5 MG tablet TAKE 1 TABLET BY MOUTH DAILY AS NEEDED FOR ANXIETY 30 tablet 0   fluticasone (FLONASE) 50 MCG/ACT nasal spray Place 2 sprays into both nostrils daily. 16 g 0   hydrocortisone (ANUSOL-HC) 2.5 % rectal cream PLACE 1 APPLICATION RECTALLY 2 TIMES DAILY. 30 g 0   ibuprofen (ADVIL) 200 MG tablet Take 400-600 mg by mouth every 6 (six) hours as needed.     losartan-hydrochlorothiazide (HYZAAR) 50-12.5 MG tablet Take 1 tablet by mouth daily. 90 tablet  1   methocarbamol (ROBAXIN) 500 MG tablet Take 1 tablet (500 mg total) by mouth every 8 (eight) hours as needed for muscle spasms. 20 tablet 0   omeprazole (PRILOSEC) 40 MG capsule Take 1 capsule (40 mg total) by mouth 2 (two) times daily. 180 capsule 1   No current facility-administered medications for this visit.    No Known Allergies  Family History  Problem Relation Age of Onset   Hypertension Mother        alive   Arthritis Mother    Lung cancer Father        deceased   Heart disease Father    Other Other        1 brother and 1 sister both alive an healthy   Hypertension Maternal Grandmother     Social History   Socioeconomic History   Marital status: Widowed    Spouse name: Not on file   Number of children: 3   Years of education: Not on file   Highest education level: Not on file  Occupational History   Occupation: customer service    Employer: [employer]  Tobacco Use   Smoking status: Former    Types: Cigarettes    Quit date: 04/20/1985    Years  since quitting: 37.0   Smokeless tobacco: Never   Tobacco comments:    remote tobacco use   Vaping Use   Vaping Use: Never used  Substance and Sexual Activity   Alcohol use: No    Alcohol/week: 0.0 standard drinks of alcohol   Drug use: No   Sexual activity: Not Currently  Other Topics Concern   Not on file  Social History Narrative   Not on file   Social Determinants of Health   Financial Resource Strain: Not on file  Food Insecurity: Not on file  Transportation Needs: Not on file  Physical Activity: Not on file  Stress: Not on file  Social Connections: Not on file  Intimate Partner Violence: Not on file     Constitutional: Denies fever, malaise, fatigue, headache or abrupt weight changes.  HEENT: Patient reports nasal congestion.  Denies eye pain, eye redness, ear pain, ringing in the ears, wax buildup, runny nose,  bloody nose, or sore throat. Respiratory: Denies difficulty breathing, shortness of  breath, cough or sputum production.   Cardiovascular: Denies chest pain, chest tightness, palpitations or swelling in the hands or feet.  Gastrointestinal: Denies abdominal pain, bloating, constipation, diarrhea or blood in the stool.  GU: Denies urgency, frequency, pain with urination, burning sensation, blood in urine, odor or discharge. Musculoskeletal: Pt reports mid back pain and pain in right buttocks. Denies decrease in range of motion, difficulty with gait, or joint pain and swelling.  Skin: Denies redness, rashes, lesions or ulcercations.  Neurological: Denies numbness, tingling, weakness or problems with balance and coordination.    No other specific complaints in a complete review of systems (except as listed in HPI above).  Objective:   Physical Exam  BP 118/65 (BP Location: Left Arm, Patient Position: Sitting, Cuff Size: Large)   Pulse 79   Temp (!) 96.8 F (36 C) (Temporal)   Wt 230 lb (104.3 kg)   LMP 01/20/2016   SpO2 97%   BMI 36.02 kg/m   Wt Readings from Last 3 Encounters:  02/02/22 221 lb 6.4 oz (100.4 kg)  01/26/22 220 lb (99.8 kg)  12/30/21 221 lb (100.2 kg)    General: Appears her stated age, obese, in NAD. Skin: Warm, dry and intact. HEENT: Head: normal shape and size; Throat/Mouth: Teeth present, mucosa pink and moist, no exudate, lesions or ulcerations noted.  Neck: No adenopathy noted.  Cardiovascular: Normal rate and rhythm.  Pulmonary/Chest: Normal effort and positive vesicular breath sounds. No respiratory distress. No wheezes, rales or ronchi noted.  Musculoskeletal: No bony tenderness noted over the spine. Pain with palpation of the right parathoracic muscles. No difficulty with gait.  Neurological: Alert and oriented.     BMET    Component Value Date/Time   NA 143 12/15/2021 1003   K 3.8 12/15/2021 1003   CL 105 12/15/2021 1003   CO2 25 12/15/2021 1003   GLUCOSE 135 (H) 12/15/2021 1003   BUN 12 12/15/2021 1003   CREATININE 0.64  12/15/2021 1003   CALCIUM 9.2 12/15/2021 1003   GFRNONAA >60 07/02/2021 2000   GFRAA >60 07/27/2017 2153    Lipid Panel     Component Value Date/Time   CHOL 220 (H) 12/15/2021 1003   CHOL 189 07/28/2021 1553   TRIG 88 12/15/2021 1003   TRIG 120 07/28/2021 1553   HDL 23 (L) 12/15/2021 1003   CHOLHDL 9.6 (H) 12/15/2021 1003   VLDL 21.0 02/09/2019 1245   LDLCALC 177 (H) 12/15/2021 1003    CBC  Component Value Date/Time   WBC 3.9 (L) 07/02/2021 2000   RBC 4.73 07/02/2021 2000   HGB 14.1 07/02/2021 2000   HCT 41.5 07/02/2021 2000   PLT 219 07/02/2021 2000   MCV 87.7 07/02/2021 2000   MCH 29.8 07/02/2021 2000   MCHC 34.0 07/02/2021 2000   RDW 13.5 07/02/2021 2000   LYMPHSABS 1.1 07/28/2016 1232   MONOABS 0.4 07/28/2016 1232   EOSABS 0.0 07/28/2016 1232   BASOSABS 0.0 07/28/2016 1232    Hgb A1C Lab Results  Component Value Date   HGBA1C 6.0 (H) 12/15/2021           Assessment & Plan:   Nasal Congestion:  Rapid covid negative Rapid flu negative Try Afrin OTC   Mid Back Pain, Piriformis Syndrome:  Encouraged stretching Advised her to take Ibuprofen or Tylenol OTC RX for Methocarbamol 500 mg Q8H prn- sedation caution given She will consider PT if symptoms persist or worsen  RTC in 2 months for annual exam Webb Silversmith, NP

## 2022-04-21 NOTE — Patient Instructions (Signed)
Piriformis Syndrome Rehab Ask your health care provider which exercises are safe for you. Do exercises exactly as told by your health care provider and adjust them as directed. It is normal to feel mild stretching, pulling, tightness, or discomfort as you do these exercises. Stop right away if you feel sudden pain or your pain gets worse. Do not begin these exercises until told by your health care provider. Stretching and range-of-motion exercises These exercises warm up your muscles and joints and improve the movement and flexibility of your hip and pelvis. The exercises also help to relieve pain, numbness, and tingling. Nerve root  Sit on a firm surface that is high enough that you can swing your left / right foot freely. Place a folded towel under your left / right thigh. This is optional. Drop your head forward and round your back. While you keep your left / right foot relaxed, slowly straighten your left / right knee until you feel a slight pull behind your knee or calf. If your leg is fully extended and you still do not feel a pull, slowly tilt your foot and toes toward you. Hold this position for __________ seconds. Slowly return your knee to its starting position. Hip rotation This is an exercise in which you lie on your back and stretch the muscles that rotate your hip (hip rotators) to stretch your buttocks. Lie on your back on a firm surface. Pull your left / right knee toward your same shoulder with your left / right hand until your knee is pointing toward the ceiling. Hold your left / right ankle with your other hand. Keeping your knee steady, gently pull your left / right ankle toward your other shoulder until you feel a stretch in your buttocks. Hold this position for __________ seconds. Repeat __________ times. Complete this exercise __________ times a day. Hip extensor This is an exercise in which you lie on your back and pull your knee to your chest. Lie on your back on a firm  surface. Both of your legs should be straight. Pull your left / right knee to your chest. Hold your leg in this position by holding on to the back of your thigh or the front of your knee. Hold this position for __________ seconds. Slowly return to the starting position. Repeat __________ times. Complete this exercise __________ times a day. Strengthening exercises These exercises build strength and endurance in your hip and thigh muscles. Endurance is the ability to use your muscles for a long time, even after they get tired. Straight leg raises, side-lying This exercise strengthens the muscles that rotate the leg at the hip and move it away from your body (hip abductors). Lie on your side with your left / right leg in the top position. Lie so your head, shoulder, knee, and hip line up. Bend your bottom knee to help you balance. Lift your top leg 4-6 inches (10-15 cm) while keeping your toes pointed straight ahead. Hold this position for __________ seconds. Slowly lower your leg to the starting position. Let your muscles relax completely after each repetition. Repeat __________ times. Complete this exercise __________ times a day. Hip abduction and rotation This is sometimes called quadruped (on hands and knees) exercises. Get on your hands and knees on a firm, lightly padded surface. Your hands should be directly below your shoulders, and your knees should be directly below your hips. Lift your left / right knee out to the side. Keep your knee bent. Do not twist   your body. Hold this position for __________ seconds. Slowly lower your leg. Repeat __________ times. Complete this exercise __________ times a day. Straight leg raises, prone This exercise stretches the muscles that move the hips (hip extensors). Lie on your abdomen on a firm surface (prone position). Tense the muscles in your buttocks and lift your left / right leg about 4 inches (10 cm). Keep your knee straight as you lift your  leg. If you cannot lift your leg that high without arching your back, place a pillow under your hips. Hold this position for __________ seconds. Slowly lower your leg to the starting position. Let your muscles relax completely after each repetition. Repeat __________ times. Complete this exercise __________ times a day. This information is not intended to replace advice given to you by your health care provider. Make sure you discuss any questions you have with your health care provider. Document Revised: 10/08/2020 Document Reviewed: 10/08/2020 Elsevier Patient Education  2023 Else[employer] Inc.  

## 2022-04-30 ENCOUNTER — Ambulatory Visit: Payer: Self-pay | Admitting: *Deleted

## 2022-04-30 MED ORDER — AMOXICILLIN-POT CLAVULANATE 875-125 MG PO TABS: 1.0000 | ORAL_TABLET | Freq: Two times a day (BID) | ORAL | 0 refills | Status: DC

## 2022-04-30 NOTE — Telephone Encounter (Signed)
Augmentin sent to pharmacy.

## 2022-04-30 NOTE — Addendum Note (Signed)
Addended by: Jearld Fenton on: 04/30/2022 11:51 AM   Modules accepted: Orders

## 2022-04-30 NOTE — Telephone Encounter (Signed)
Summary: worsing symptoms cold infection   Pt was seen on 1/2 for nasal congestion but now is coughing up a very thick green mucous and is also blowing into tissue the same. Pt is request an antibiotic at least since was seen a week or so ago. Pls fu at  [phone]       Chief Complaint: last seen in [place] 04/21/22 and now cough and nasal drainage productive, requesting antibiotic Symptoms: cough now productive - green mucus. Blowing nose green drainage. Still taking nyquil at night cold/ flu. Not getting rid of symptoms.  Frequency: after 04/21/22 Pertinent Negatives: Patient denies chest pain no difficulty breathing no fever, no shortness of breath Disposition: [] ED /[] Urgent Care (no appt availability in office) / [] Appointment(In office/virtual)/ []  [facility]/ [] Home Care/ [] Refused Recommended Disposition /[] [facility]/ [x]  Follow-up with PCP Additional Notes:   Please advise if another appt needed. Patient last seen 04/21/22 with similar sx but cough , nasal drainage now green in color. Please advise if antibiotic can be prescribed per patient request. Recommended patient continue OTC medications and will advise if PCP recommends another OV.         Reason for Disposition  [1] Nasal discharge AND [2] present > 10 days    Cough and nasal discharge started after 04/21/22.  Answer Assessment - Initial Assessment Questions 1. ONSET: "When did the cough begin?"      See OV from 04/21/22 2. SEVERITY: "How bad is the cough today?"      Coughing up green mucus  3. SPUTUM: "Describe the color of your sputum" (none, dry cough; clear, white, yellow, green)     Green  4. HEMOPTYSIS: "Are you coughing up any blood?" If so ask: "How much?" (flecks, streaks, tablespoons, etc.)     no 5. DIFFICULTY BREATHING: "Are you having difficulty breathing?" If Yes, ask: "How bad is it?" (e.g., mild, moderate, severe)    - MILD: No SOB at rest, mild SOB with walking, speaks normally in  sentences, can lie down, no retractions, pulse < 100.    - MODERATE: SOB at rest, SOB with minimal exertion and prefers to sit, cannot lie down flat, speaks in phrases, mild retractions, audible wheezing, pulse 100-120.    - SEVERE: Very SOB at rest, speaks in single words, struggling to breathe, sitting hunched forward, retractions, pulse > 120      No SOB 6. FEVER: "Do you have a fever?" If Yes, ask: "What is your temperature, how was it measured, and when did it start?"     no 7. CARDIAC HISTORY: "Do you have any history of heart disease?" (e.g., heart attack, congestive heart failure)      na 8. LUNG HISTORY: "Do you have any history of lung disease?"  (e.g., pulmonary embolus, asthma, emphysema)     na 9. PE RISK FACTORS: "Do you have a history of blood clots?" (or: recent major surgery, recent prolonged travel, bedridden)     na 10. OTHER SYMPTOMS: "Do you have any other symptoms?" (e.g., runny nose, wheezing, chest pain)       Productive cough, green mucus. Blowing nose for green mucus 11. PREGNANCY: "Is there any chance you are pregnant?" "When was your last menstrual period?"       na 12. TRAVEL: "Have you traveled out of the country in the last month?" (e.g., travel history, exposures)       na  Protocols used: Cough - Acute Productive-A-AH

## 2022-05-05 ENCOUNTER — Ambulatory Visit: Payer: BC Managed Care – PPO | Admitting: Internal Medicine

## 2022-05-05 ENCOUNTER — Encounter: Payer: Self-pay | Admitting: Internal Medicine

## 2022-05-05 VITALS — BP 136/76 | HR 73 | Temp 96.6°F | Wt 234.0 lb

## 2022-05-05 DIAGNOSIS — J069 Acute upper respiratory infection, unspecified: Secondary | ICD-10-CM

## 2022-05-05 MED ORDER — PREDNISONE 10 MG PO TABS: ORAL_TABLET | ORAL | 0 refills | Status: DC

## 2022-05-05 MED ORDER — PROMETHAZINE-DM 6.25-15 MG/5ML PO SYRP: 5.0000 mL | ORAL_SOLUTION | Freq: Four times a day (QID) | ORAL | 0 refills | Status: DC | PRN

## 2022-05-05 NOTE — Progress Notes (Signed)
HPI  Pt presents to the clinic today with c/o post nasal drip and cough. This started 2 weeks ago with nasal congestion. This has improved but now she has a nonproductive cough.  She denies headache, runny nose, nasal congestion, ear pain, sore throat, shortness of breath, chest pain, nausea, vomiting or diarrhea.  She denies fever, chills or body aches.  She was treated with Augmentin 875-125 which she has taken with minimal improvement in symptoms.  She had a negative COVID and flu test 04/21/2022.  Review of Systems      Past Medical History:  Diagnosis Date   Allergy    Ear infection    recurrent    Family History  Problem Relation Age of Onset   Hypertension Mother        alive   Arthritis Mother    Lung cancer Father        deceased   Heart disease Father    Other Other        1 brother and 1 sister both alive an healthy   Hypertension Maternal Grandmother     Social History   Socioeconomic History   Marital status: Widowed    Spouse name: Not on file   Number of children: 3   Years of education: Not on file   Highest education level: Not on file  Occupational History   Occupation: customer service    Employer: [employer]  Tobacco Use   Smoking status: Former    Types: Cigarettes    Quit date: 04/20/1985    Years since quitting: 37.0   Smokeless tobacco: Never   Tobacco comments:    remote tobacco use   Vaping Use   Vaping Use: Never used  Substance and Sexual Activity   Alcohol use: No    Alcohol/week: 0.0 standard drinks of alcohol   Drug use: No   Sexual activity: Not Currently  Other Topics Concern   Not on file  Social History Narrative   Not on file   Social Determinants of Health   Financial Resource Strain: Not on file  Food Insecurity: Not on file  Transportation Needs: Not on file  Physical Activity: Not on file  Stress: Not on file  Social Connections: Not on file  Intimate Partner Violence: Not on file    No Known  Allergies   Constitutional:  Denies headache, fatigue, fever or abrupt weight changes.  HEENT:  Positive postnasal drip. Denies eye redness, eye pain, pressure behind the eyes, facial pain, nasal congestion, ear pain, ringing in the ears, wax buildup, runny nose or sore throat. Respiratory: Positive cough. Denies difficulty breathing or shortness of breath.  Cardiovascular: Denies chest pain, chest tightness, palpitations or swelling in the hands or feet.   No other specific complaints in a complete review of systems (except as listed in HPI above).  Objective:   BP 136/76 (BP Location: Left Arm, Patient Position: Sitting, Cuff Size: Normal)   Pulse 73   Temp (!) 96.6 F (35.9 C) (Temporal)   Wt 234 lb (106.1 kg)   LMP 01/20/2016   SpO2 100%   BMI 36.65 kg/m   Wt Readings from Last 3 Encounters:  04/21/22 230 lb (104.3 kg)  02/02/22 221 lb 6.4 oz (100.4 kg)  01/26/22 220 lb (99.8 kg)     General: Appears her stated age, obese, in NAD. HEENT: Head: normal shape and size, no sinus tenderness noted; Eyes: sclera white, no icterus, conjunctiva pink; Throat/Mouth: + PND. Teeth  present, mucosa erythematous and moist, no exudate noted, no lesions or ulcerations noted.  Neck: No cervical lymphadenopathy.  Cardiovascular: Normal rate and rhythm. S1,S2 noted.  No murmur, rubs or gallops noted.  Pulmonary/Chest: Normal effort and positive vesicular breath sounds. No respiratory distress. No wheezes, rales or ronchi noted.       Assessment & Plan:   Viral Upper Respiratory Infection with Cough:  Get some rest and drink plenty of water Rx for Pred taper x 6 days Rx for Promethazine DM cough syrup  RTC in 2 months for annual exam   Webb Silversmith, NP

## 2022-05-05 NOTE — Patient Instructions (Signed)

## 2022-05-08 ENCOUNTER — Ambulatory Visit: Payer: BC Managed Care – PPO | Admitting: Adult Health

## 2022-05-08 DIAGNOSIS — G4710 Hypersomnia, unspecified: Secondary | ICD-10-CM | POA: Diagnosis not present

## 2022-05-08 DIAGNOSIS — G4719 Other hypersomnia: Secondary | ICD-10-CM

## 2022-05-18 ENCOUNTER — Other Ambulatory Visit: Payer: Self-pay | Admitting: Internal Medicine

## 2022-05-18 DIAGNOSIS — L501 Idiopathic urticaria: Secondary | ICD-10-CM | POA: Diagnosis not present

## 2022-05-19 NOTE — Telephone Encounter (Signed)
Requested medication (s) are due for refill today: yes  Requested medication (s) are on the active medication list: yes  Last refill:  04/21/22 #30  Future visit scheduled: yes  Notes to clinic:  yes   Requested Prescriptions  Pending Prescriptions Disp Refills   methocarbamol (ROBAXIN) 500 MG tablet [Pharmacy Med Name: METHOCARBAMOL 500 MG TABS 500 Tablet] 30 tablet 0    Sig: TAKE 1 TABLET BY MOUTH EVERY 8 HOURS AS NEEDED FOR MUSCLE SPASMS.     Not Delegated - Analgesics:  Muscle Relaxants Failed - 05/18/2022  3:03 PM      Failed - This refill cannot be delegated      Passed - Valid encounter within last 6 months    Recent Outpatient Visits           2 weeks ago Viral URI with cough   [facility] [place], Coralie Keens, NP   4 weeks ago Nasal congestion   [facility] [place], [place], [place]   3 months ago Acute bilateral thoracic back pain   [facility] [place], Coralie Keens, NP   4 months ago Essential hypertension   [facility] [place], Coralie Keens, NP   5 months ago [facility] [place], Coralie Keens, NP       Future Appointments             In 1 month Baity, Coralie Keens, NP [facility], [facility]

## 2022-05-22 ENCOUNTER — Telehealth: Payer: Self-pay | Admitting: Pulmonary Disease

## 2022-05-22 DIAGNOSIS — G4710 Hypersomnia, unspecified: Secondary | ICD-10-CM | POA: Diagnosis not present

## 2022-05-22 NOTE — Telephone Encounter (Signed)
Call patient  Sleep study result  Date of study: 05/10/2022  Impression: Negative study for significant sleep disordered breathing No significant oxygen desaturations  Recommendation: Clinical follow-up of symptoms  Consider in lab study if there is still significant concern for significant sleep disordered breathing however a negative home sleep study will be consistent with absence of moderate to severe obstructive sleep apnea  Encourage aggressive weight loss measures  Follow-up as previously scheduled

## 2022-05-25 NOTE — Telephone Encounter (Signed)
Called the pt and there was no answer- LMTCB.  

## 2022-05-26 NOTE — Progress Notes (Signed)
Sleep study was negative for sleep apnea, follow up with PCP regarding fatigue symptoms

## 2022-05-26 NOTE — Telephone Encounter (Signed)
Sent results message to emily to try her again

## 2022-05-27 NOTE — Progress Notes (Signed)
Spoke with Patient regarding sleep study result's.Per Beth  Sleep study was negative for sleep apnea, follow up with PCP regarding fatigue symptoms   Patient's voice was understanding.Nothing else further needed.

## 2022-05-31 NOTE — Progress Notes (Signed)
Office Visit Note  Patient: Diana [name]             Date of Birth: [birth year]           MRN: [id number]             PCP: Jearld Fenton, NP Referring: Jearld Fenton, NP Visit Date: 06/01/2022 Occupation: @GUAROCC$ @  Subjective:  No chief complaint on file.   History of Present Illness: Diana [name] is a 62 y.o. female here for evaluation of chronic joint and muscle pains and fatigue. ***    Labs reviewed ANA neg ES R 9 CRP 1.3  Activities of Daily Living:  Patient reports morning stiffness for *** {minute/hour:19697}.   Patient {ACTIONS;DENIES/REPORTS:21021675::"Denies"} nocturnal pain.  Difficulty dressing/grooming: {ACTIONS;DENIES/REPORTS:21021675::"Denies"} Difficulty climbing stairs: {ACTIONS;DENIES/REPORTS:21021675::"Denies"} Difficulty getting out of chair: {ACTIONS;DENIES/REPORTS:21021675::"Denies"} Difficulty using hands for taps, buttons, cutlery, and/or writing: {ACTIONS;DENIES/REPORTS:21021675::"Denies"}  No Rheumatology ROS completed.   PMFS History:  Patient Active Problem List   Diagnosis Date Noted   Excessive daytime sleepiness 02/02/2022   Pure hypercholesterolemia 11/17/2021   Prediabetes 01/02/2021   Class 2 obesity due to excess calories with body mass index (BMI) of 35.0 to 35.9 in adult 11/12/2020   Anxiety and depression 06/08/2019   Chronic constipation 04/26/2019   Intermittent low back pain 10/11/2018   Chronic hepatitis B virus infection ([place]) 11/10/2017   GERD (gastroesophageal reflux disease) 06/09/2016   Essential hypertension 03/07/2015    Past Medical History:  Diagnosis Date   Allergy    Ear infection    recurrent    Family History  Problem Relation Age of Onset   Hypertension Mother        alive   Arthritis Mother    Lung cancer Father        deceased   Heart disease Father    Other Other        1 brother and 1 sister both alive an healthy   Hypertension Maternal Grandmother    Past Surgical History:   Procedure Laterality Date   ABDOMINAL HYSTERECTOMY  2019   partial   COLONOSCOPY WITH PROPOFOL N/A 11/10/2021   Procedure: COLONOSCOPY WITH PROPOFOL;  Surgeon: Lin Landsman, MD;  Location: [facility] ENDOSCOPY;  Service: Gastroenterology;  Laterality: N/A;   TONSILLECTOMY     TUBAL LIGATION     bilateral   Social History   Social History Narrative   Not on file   Immunization History  Administered Date(s) Administered   Tdap 04/16/2015     Objective: Vital Signs: LMP 01/20/2016    Physical Exam   Musculoskeletal Exam: ***  CDAI Exam: CDAI Score: -- Patient Global: --; Provider Global: -- Swollen: --; Tender: -- Joint Exam 06/01/2022   No joint exam has been documented for this visit   There is currently no information documented on the homunculus. Go to the Rheumatology activity and complete the homunculus joint exam.  Investigation: No additional findings.  Imaging: No results found.  Recent Labs: Lab Results  Component Value Date   WBC 3.9 (L) 07/02/2021   HGB 14.1 07/02/2021   PLT 219 07/02/2021   NA 143 12/15/2021   K 3.8 12/15/2021   CL 105 12/15/2021   CO2 25 12/15/2021   GLUCOSE 135 (H) 12/15/2021   BUN 12 12/15/2021   CREATININE 0.64 12/15/2021   BILITOT 1.7 (H) 12/15/2021   ALKPHOS 72 07/02/2021   AST 11 12/15/2021   ALT 11 12/15/2021   PROT 7.2 12/15/2021   ALBUMIN  4.2 07/02/2021   CALCIUM 9.2 12/15/2021   GFRAA >60 07/27/2017    Speciality Comments: No specialty comments available.  Procedures:  No procedures performed Allergies: Patient has no known allergies.   Assessment / Plan:     Visit Diagnoses: No diagnosis found.  Orders: No orders of the defined types were placed in this encounter.  No orders of the defined types were placed in this encounter.   Face-to-face time spent with patient was *** minutes. Greater than 50% of time was spent in counseling and coordination of care.  Follow-Up Instructions: No follow-ups on  file.   Collier Salina, MD  Note - This record has been created using [employer].  Chart creation errors have been sought, but may not always  have been located. Such creation errors do not reflect on  the standard of medical care.

## 2022-06-01 ENCOUNTER — Ambulatory Visit: Payer: BC Managed Care – PPO | Attending: Internal Medicine | Admitting: Internal Medicine

## 2022-06-01 ENCOUNTER — Other Ambulatory Visit (HOSPITAL_COMMUNITY)
Admission: RE | Admit: 2022-06-01 | Discharge: 2022-06-01 | Disposition: A | Discharge status: Home / Self Care | Payer: BC Managed Care – PPO | Source: Ambulatory Visit | Attending: Family Medicine | Admitting: Family Medicine

## 2022-06-01 ENCOUNTER — Encounter: Payer: Self-pay | Admitting: Family Medicine

## 2022-06-01 ENCOUNTER — Encounter: Payer: Self-pay | Admitting: Internal Medicine

## 2022-06-01 ENCOUNTER — Ambulatory Visit: Payer: BC Managed Care – PPO | Admitting: Family Medicine

## 2022-06-01 VITALS — BP 134/78 | HR 84 | Ht 67.0 in | Wt 240.4 lb

## 2022-06-01 VITALS — BP 116/79 | HR 66 | Resp 16 | Ht 67.0 in | Wt 238.0 lb

## 2022-06-01 DIAGNOSIS — M7918 Myalgia, other site: Secondary | ICD-10-CM

## 2022-06-01 DIAGNOSIS — Z113 Encounter for screening for infections with a predominantly sexual mode of transmission: Secondary | ICD-10-CM | POA: Diagnosis not present

## 2022-06-01 DIAGNOSIS — M2550 Pain in unspecified joint: Secondary | ICD-10-CM | POA: Insufficient documentation

## 2022-06-01 DIAGNOSIS — Z202 Contact with and (suspected) exposure to infections with a predominantly sexual mode of transmission: Secondary | ICD-10-CM

## 2022-06-01 DIAGNOSIS — Z114 Encounter for screening for human immunodeficiency virus [HIV]: Secondary | ICD-10-CM

## 2022-06-01 DIAGNOSIS — N760 Acute vaginitis: Secondary | ICD-10-CM | POA: Diagnosis not present

## 2022-06-01 MED ORDER — FLUCONAZOLE 150 MG PO TABS: ORAL_TABLET | ORAL | 0 refills | Status: DC

## 2022-06-01 MED ORDER — METRONIDAZOLE 500 MG PO TABS: 500.0000 mg | ORAL_TABLET | Freq: Two times a day (BID) | ORAL | 0 refills | Status: DC

## 2022-06-01 MED ORDER — AZITHROMYCIN 500 MG PO TABS: 1000.0000 mg | ORAL_TABLET | Freq: Once | ORAL | 0 refills | Status: AC

## 2022-06-01 NOTE — Patient Instructions (Addendum)
Thank you for coming to the office today.  Testing today for all STDs  Chlamydia / Gonorrhea - in the swab test.  If the test is positive for Chlamydia - then please take the Azithromycin 518m x 2 = 10026m if you do get sick w this, let usKoreanow and we can switch to the Doxycycline for 1 week.  If gonorrhea is positive, then you would need an injection / shot antibiotic to treat it. Can be done at Urgent Care / ED / Health Dept, we can also arrange here, but it should be promptly treated.  If it is Trichmonas or BV Bacterial Vaginosis - take the Metronidazole (Flagyl)  If it is yeast / canididal infection - then take the Diflucan (Fluconazole)   Please schedule a Follow-up Appointment to: Return if symptoms worsen or fail to improve.  If you have any other questions or concerns, please feel free to call the office or send a message through [facility] may also schedule an earlier appointment if necessary.  Additionally, you may be receiving a survey about your experience at our office within a few days to 1 week by e-mail or mail. We value your feedback.  AlNobie PutnamDO [facility]

## 2022-06-01 NOTE — Progress Notes (Signed)
Subjective:    Patient ID: Diana [name], female    DOB: 10-26-1960, 62 y.o.   MRN: [id number]  Diana [name] is a 62 y.o. female presenting on 06/01/2022 for Exposure to STD  Patient presents for a same day appointment.  PCP Webb Silversmith, FNP  HPI  Vaginal Itching STD Exposure Reports identified some vaginal / groin itching without rash 2-3 weeks ago. She has been with sexual partner who has reportedly had STD. She wanted to get testing today for this reason. History of BV in past. Denies any fever chills body aches, vaginal discharge, dysuria, frequency, hematuria      05/05/2022    2:30 PM 04/21/2022    3:29 PM 08/06/2021   11:21 AM  Depression screen PHQ 2/9  Decreased Interest 0 0 0  Down, Depressed, Hopeless 0 0 0  PHQ - 2 Score 0 0 0  Altered sleeping   0  Tired, decreased energy   1  Change in appetite   1  Feeling bad or failure about yourself    0  Trouble concentrating   0  Moving slowly or fidgety/restless   0  Suicidal thoughts   0  PHQ-9 Score   2  Difficult doing work/chores   Not difficult at all    Social History   Tobacco Use   Smoking status: Former    Types: Cigarettes    Quit date: 04/20/1985    Years since quitting: 37.1    Passive exposure: Past   Smokeless tobacco: Never   Tobacco comments:    remote tobacco use   Vaping Use   Vaping Use: Never used  Substance Use Topics   Alcohol use: No    Alcohol/week: 0.0 standard drinks of alcohol   Drug use: No    Review of Systems Per HPI unless specifically indicated above     Objective:    BP 134/78   Pulse 84   Ht 5' 7"$  (1.702 m)   Wt 240 lb 6.4 oz (109 kg)   LMP 01/20/2016   SpO2 99%   BMI 37.65 kg/m   Wt Readings from Last 3 Encounters:  06/01/22 240 lb 6.4 oz (109 kg)  06/01/22 238 lb (108 kg)  05/05/22 234 lb (106.1 kg)    Physical Exam Vitals and nursing note reviewed.  Constitutional:      General: She is not in acute distress.    Appearance: Normal appearance. She  is well-developed. She is not diaphoretic.     Comments: Well-appearing, comfortable, cooperative  HENT:     Head: Normocephalic and atraumatic.  Eyes:     General:        Right eye: No discharge.        Left eye: No discharge.     Conjunctiva/sclera: Conjunctivae normal.  Cardiovascular:     Rate and Rhythm: Normal rate.  Pulmonary:     Effort: Pulmonary effort is normal.  Skin:    General: Skin is warm and dry.     Findings: No erythema or rash.  Neurological:     Mental Status: She is alert and oriented to person, place, and time.  Psychiatric:        Mood and Affect: Mood normal.        Behavior: Behavior normal.        Thought Content: Thought content normal.     Comments: Well groomed, good eye contact, normal speech and thoughts       Results for  orders placed or performed in visit on 04/21/22  POC COVID-19  Result Value Ref Range   SARS Coronavirus 2 Ag Negative Negative  POCT Influenza A/B  Result Value Ref Range   Influenza A, POC Negative Negative   Influenza B, POC Negative Negative      Assessment & Plan:   Problem List Items Addressed This Visit   None Visit Diagnoses     Exposure to sexually transmitted disease (STD)    -  Primary   Relevant Medications   azithromycin (ZITHROMAX) 500 MG tablet   metroNIDAZOLE (FLAGYL) 500 MG tablet   fluconazole (DIFLUCAN) 150 MG tablet   Other Relevant Orders   HIV Antibody (routine testing w rflx)   RPR   Cervicovaginal ancillary only   Screen for STD (sexually transmitted disease)       Relevant Medications   fluconazole (DIFLUCAN) 150 MG tablet   Other Relevant Orders   HIV Antibody (routine testing w rflx)   RPR   Cervicovaginal ancillary only   Screening for HIV (human immunodeficiency virus)       Relevant Medications   metroNIDAZOLE (FLAGYL) 500 MG tablet   fluconazole (DIFLUCAN) 150 MG tablet   Other Relevant Orders   HIV Antibody (routine testing w rflx)   Acute vaginitis       Relevant  Medications   metroNIDAZOLE (FLAGYL) 500 MG tablet   fluconazole (DIFLUCAN) 150 MG tablet       High risk sexual behavior with recent potential exposure to STD Symptoms with vaginitis / itching currently  Plan: STD testing collected - GC/Chlamdyia swab, HIV and RPR - f/u results this week  Printed all rx for potential treatment as indicated. Patient will be out of town this week. This was given as back up plan. Given her lack of severity with her symptoms and also uncertainty about actual STD exposure, we agreed mutually to not empirically start treatment today.  If the test is positive for Chlamydia - then please take the Azithromycin 555m x 2 = 10081m if you do get sick w this, let usKoreanow and we can switch to the Doxycycline for 1 week.  If gonorrhea is positive, then you would need an injection / shot antibiotic to treat it. Can be done at Urgent Care / ED / Health Dept, we can also arrange here, but it should be promptly treated.  If it is Trichmonas or BV Bacterial Vaginosis - take the Metronidazole (Flagyl)  If it is yeast / canididal infection - then take the Diflucan (Fluconazole)    Meds ordered this encounter  Medications   azithromycin (ZITHROMAX) 500 MG tablet    Sig: Take 2 tablets (1,000 mg total) by mouth once for 1 dose. Take with food. If nausea/vomit, then notify doctor office for alternative rx.    Dispense:  2 tablet    Refill:  0   metroNIDAZOLE (FLAGYL) 500 MG tablet    Sig: Take 1 tablet (500 mg total) by mouth 2 (two) times daily. Do not drink alcohol while taking this medicine.    Dispense:  14 tablet    Refill:  0   fluconazole (DIFLUCAN) 150 MG tablet    Sig: Take one tablet by mouth on Day 1. Repeat dose 2nd tablet on Day 3.    Dispense:  2 tablet    Refill:  0      Follow up plan: Return if symptoms worsen or fail to improve.   AlNobie PutnamDO [facility] 06/01/2022, 1:24 PM

## 2022-06-01 NOTE — Patient Instructions (Signed)
I recommend checking out the [place] patient-centered guide for fibromyalgia and chronic pain management: [email or web address]   Myofascial Pain Syndrome and Fibromyalgia Myofascial pain syndrome and fibromyalgia are both pain disorders. You may feel this pain mainly in your muscles. Myofascial pain syndrome: Always has tender points in the muscles that will cause pain when pressed (trigger points). The pain may come and go. Usually affects your neck, upper back, and shoulder areas. The pain often moves into your arms and hands. Fibromyalgia: Has muscle pains and tenderness that come and go. Is often associated with tiredness (fatigue) and sleep problems. Has trigger points. Tends to be long-lasting (chronic), but is not life-threatening. Fibromyalgia and myofascial pain syndrome are not the same. However, they often occur together. If you have both conditions, each can make the other worse. Both are common and can cause enough pain and fatigue to make day-to-day activities difficult. Both can be hard to diagnose because their symptoms are common in many other conditions. What are the causes? The exact causes of these conditions are not known. What increases the risk? You are more likely to develop either of these conditions if: You have a family history of the condition. You are female. You have certain triggers, such as: Spine disorders. An injury (trauma) or other physical stressors. Being under a lot of stress. Medical conditions such as osteoarthritis, rheumatoid arthritis, or lupus. What are the signs or symptoms? Fibromyalgia The main symptom of fibromyalgia is widespread pain and tenderness in your muscles. Pain is sometimes described as stabbing, shooting, or burning. You may also have: Tingling or numbness. Sleep problems and fatigue. Problems with attention and concentration (fibro fog). Other symptoms may include: Bowel and bladder  problems. Headaches. Vision problems. Sensitivity to odors and noises. Depression or mood changes. Painful menstrual periods (dysmenorrhea). Dry skin or eyes. These symptoms can vary over time. Myofascial pain syndrome Symptoms of myofascial pain syndrome include: Tight, ropy bands of muscle. Uncomfortable sensations in muscle areas. These may include aching, cramping, burning, numbness, tingling, and weakness. Difficulty moving certain parts of the body freely (poor range of motion). How is this diagnosed? This condition may be diagnosed by your symptoms and medical history. You will also have a physical exam. In general: Fibromyalgia is diagnosed if you have pain, fatigue, and other symptoms for more than 3 months, and symptoms cannot be explained by another condition. Myofascial pain syndrome is diagnosed if you have trigger points in your muscles, and those trigger points are tender and cause pain elsewhere in your body (referred pain). How is this treated? Treatment for these conditions depends on the type that you have. For fibromyalgia a healthy lifestyle is the most important treatment including aerobic and strength exercises. Different types of medicines are used to help treat pain and include: NSAIDs. Medicines for treating depression. Medicines that help control seizures. Medicines that relax the muscles. Treatment for myofascial pain syndrome includes: Pain medicines, such as NSAIDs. Cooling and stretching of muscles. Massage therapy with myofascial release technique. Trigger point injections. Treating these conditions often requires a team of health care providers. These may include: Your primary care provider. A physical therapist. Complementary health care providers, such as massage therapists or acupuncturists. A psychiatrist for cognitive behavioral therapy. Follow these instructions at home: Medicines Take over-the-counter and prescription medicines only as told  by your health care provider. Ask your health care provider if the medicine prescribed to you: Requires you to avoid driving or using machinery. Can cause constipation. You  may need to take these actions to prevent or treat constipation: Drink enough fluid to keep your urine pale yellow. Take over-the-counter or prescription medicines. Eat foods that are high in fiber, such as beans, whole grains, and fresh fruits and vegetables. Limit foods that are high in fat and processed sugars, such as fried or sweet foods. Lifestyle  Do exercises as told by your health care provider or physical therapist. Practice relaxation techniques to control your stress. You may want to try: Biofeedback. Visual imagery. Hypnosis. Muscle relaxation. Yoga. Meditation. Maintain a healthy lifestyle. This includes eating a healthy diet and getting enough sleep. Do not use any products that contain nicotine or tobacco. These products include cigarettes, chewing tobacco, and vaping devices, such as e-cigarettes. If you need help quitting, ask your health care provider. General instructions Talk to your health care provider about complementary treatments, such as acupuncture or massage. Do not do activities that stress or strain your muscles. This includes repetitive motions and heavy lifting. Keep all follow-up visits. This is important. Where to find support Consider joining a support group with others who are diagnosed with this condition. National Fibromyalgia Association: www.fmaware.org Where to find more information American Chronic Pain Association: www.theacpa.org Contact a health care provider if: You have new symptoms. Your symptoms get worse or your pain is severe. You have side effects from your medicines. You have trouble sleeping. Your condition is causing depression or anxiety. Get help right away if: You have thoughts of hurting yourself or others. Get help right awayif you feel like you may  hurt yourself or others, or have thoughts about taking your own life. Go to your nearest emergency room or: Call 911. Call the [facility] at [phone] or 988. This is open 24 hours a day. Text the Crisis Text Line at 6011850299. Summary Myofascial pain syndrome and fibromyalgia are pain disorders. Myofascial pain syndrome has tender points in the muscles that will cause pain when pressed (trigger points). Fibromyalgia also has muscle pains and tenderness that come and go, but this condition is often associated with fatigue and sleep disturbances. Fibromyalgia and myofascial pain syndrome are not the same but often occur together, causing pain and fatigue that make day-to-day activities difficult. Follow your health care provider's instructions for taking medicines and maintaining a healthy lifestyle. This information is not intended to replace advice given to you by your health care provider. Make sure you discuss any questions you have with your health care provider. Document Revised: 03/07/2021 Document Reviewed: 03/07/2021 Elsevier Patient Education  [place].

## 2022-06-02 LAB — RPR: RPR Ser Ql: NONREACTIVE

## 2022-06-02 LAB — HIV ANTIBODY (ROUTINE TESTING W REFLEX): HIV 1&2 Ab, 4th Generation: NONREACTIVE

## 2022-06-03 LAB — CERVICOVAGINAL ANCILLARY ONLY
Bacterial Vaginitis (gardnerella): NEGATIVE
Candida Glabrata: NEGATIVE
Candida Vaginitis: NEGATIVE
Chlamydia: NEGATIVE
Comment: NEGATIVE
Comment: NEGATIVE
Comment: NEGATIVE
Comment: NEGATIVE
Comment: NEGATIVE
Comment: NORMAL
Neisseria Gonorrhea: NEGATIVE
Trichomonas: NEGATIVE

## 2022-06-17 ENCOUNTER — Other Ambulatory Visit: Payer: Self-pay | Admitting: Internal Medicine

## 2022-06-18 NOTE — Telephone Encounter (Signed)
Requested medication (s) are due for refill today:   Yes  Requested medication (s) are on the active medication list:   Yes   Future visit scheduled:   Yes in 4 days with [name]   Last ordered: 12/15/2021 #90, 1 refill  Returned because labs are due.     Requested Prescriptions  Pending Prescriptions Disp Refills   losartan-hydrochlorothiazide (HYZAAR) 50-12.5 MG tablet [Pharmacy Med Name: LOSARTAN- HCTZ 50-12.5 MG T 50-12.5 Tablet] 30 tablet 1    Sig: Take 1 tablet by mouth daily.     Cardiovascular: ARB + Diuretic Combos Failed - 06/17/2022 12:25 PM      Failed - K in normal range and within 180 days    Potassium  Date Value Ref Range Status  12/15/2021 3.8 3.5 - 5.3 mmol/L Final         Failed - Na in normal range and within 180 days    Sodium  Date Value Ref Range Status  12/15/2021 143 135 - 146 mmol/L Final         Failed - Cr in normal range and within 180 days    Creat  Date Value Ref Range Status  12/15/2021 0.64 0.50 - 1.03 mg/dL Final         Failed - eGFR is 10 or above and within 180 days    GFR calc Af Amer  Date Value Ref Range Status  07/27/2017 >60 >60 mL/min Final    Comment:    (NOTE) The eGFR has been calculated using the CKD EPI equation. This calculation has not been validated in all clinical situations. eGFR's persistently <60 mL/min signify possible Chronic Kidney Disease.    GFR, Estimated  Date Value Ref Range Status  07/02/2021 >60 >60 mL/min Final    Comment:    (NOTE) Calculated using the CKD-EPI Creatinine Equation (2021)    GFR  Date Value Ref Range Status  09/19/2019 92.37 >60.00 mL/min Final   eGFR  Date Value Ref Range Status  12/15/2021 102 > OR = 60 mL/min/1.13m Final         Passed - Patient is not pregnant      Passed - Last BP in normal range    BP Readings from Last 1 Encounters:  06/01/22 134/78         Passed - Valid encounter within last 6 months    Recent Outpatient Visits           2 weeks ago  Exposure to sexually transmitted disease (STD)   [facility]   1 month ago [facility]   1 month [facility]al [facility]   4 months ago [facility] bilateral thoracic back pain   [facility]   5 months ago Essential hypertension   [facility]       Future Appointments             In 4 days [facility]

## 2022-06-22 ENCOUNTER — Encounter: Payer: BC Managed Care – PPO | Admitting: Internal Medicine

## 2022-06-22 NOTE — Progress Notes (Deleted)
Subjective:    Patient ID: Diana [name], female    DOB: 1960/09/19, 62 y.o.   MRN: [id number]  HPI  Patient presents to clinic today for her annual exam.  Flu: Tetanus: 03/2015 COVID: Shingrix: Pap smear: Hysterectomy Mammogram: Bone density: Colon screening: 10/2021 Vision screening: Dentist:  Diet: Exercise:  Review of Systems     Past Medical History:  Diagnosis Date   Allergy    Ear infection    recurrent   Hypertension     Current Outpatient Medications  Medication Sig Dispense Refill   Acetaminophen (TYLENOL ARTHRITIS PAIN PO) Take by mouth.     Ascorbic Acid 125 MG CHEW Chew 1 each by mouth daily.     atorvastatin (LIPITOR) 10 MG tablet Take 1 tablet (10 mg total) by mouth daily. (Patient taking differently: Take 5 mg by mouth daily. Three times a week.) 90 tablet 1   cetirizine (ZYRTEC) 10 MG tablet Take 10 mg by mouth daily as needed.     Cholecalciferol (VITAMIN D3) 25 MCG (1000 UT) CAPS Take 5,000 Units by mouth.      clonazePAM (KLONOPIN) 0.5 MG tablet TAKE 1 TABLET BY MOUTH DAILY AS NEEDED FOR ANXIETY 30 tablet 0   fluconazole (DIFLUCAN) 150 MG tablet Take one tablet by mouth on Day 1. Repeat dose 2nd tablet on Day 3. 2 tablet 0   hydrocortisone (ANUSOL-HC) 2.5 % rectal cream PLACE 1 APPLICATION RECTALLY 2 TIMES DAILY. (Patient not taking: Reported on 06/01/2022) 30 g 0   ibuprofen (ADVIL) 200 MG tablet Take 400-600 mg by mouth every 6 (six) hours as needed.     losartan-hydrochlorothiazide (HYZAAR) 50-12.5 MG tablet TAKE 1 TABLET BY MOUTH DAILY. 90 tablet 1   methocarbamol (ROBAXIN) 500 MG tablet TAKE 1 TABLET BY MOUTH EVERY 8 HOURS AS NEEDED FOR MUSCLE SPASMS. 30 tablet 0   metroNIDAZOLE (FLAGYL) 500 MG tablet Take 1 tablet (500 mg total) by mouth 2 (two) times daily. Do not drink alcohol while taking this medicine. 14 tablet 0   omeprazole (PRILOSEC) 40 MG capsule Take 1 capsule (40 mg total) by mouth 2 (two) times daily. 180 capsule 1   triamcinolone  cream (KENALOG) 0.1 % Apply topically.     Turmeric (QC TUMERIC COMPLEX) 500 MG CAPS Take by mouth.     No current facility-administered medications for this visit.    No Known Allergies  Family History  Problem Relation Age of Onset   Hypertension Mother        alive   Arthritis Mother    Lung cancer Father        deceased   Heart disease Father    Hypertension Maternal Grandmother    Other Other        1 brother and 1 sister both alive an healthy    Social History   Socioeconomic History   Marital status: Widowed    Spouse name: Not on file   Number of children: 3   Years of education: Not on file   Highest education level: Not on file  Occupational History   Occupation: customer service    Employer: [employer]  Tobacco Use   Smoking status: Former    Types: Cigarettes    Quit date: 04/20/1985    Years since quitting: 37.1    Passive exposure: Past   Smokeless tobacco: Never   Tobacco comments:    remote tobacco use   Vaping Use   Vaping Use: Never used  Substance  and Sexual Activity   Alcohol use: No    Alcohol/week: 0.0 standard drinks of alcohol   Drug use: No   Sexual activity: Not Currently  Other Topics Concern   Not on file  Social History Narrative   Not on file   Social Determinants of Health   Financial Resource Strain: Not on file  Food Insecurity: Not on file  Transportation Needs: Not on file  Physical Activity: Not on file  Stress: Not on file  Social Connections: Not on file  Intimate Partner Violence: Not on file     Constitutional: Denies fever, malaise, fatigue, headache or abrupt weight changes.  HEENT: Denies eye pain, eye redness, ear pain, ringing in the ears, wax buildup, runny nose, nasal congestion, bloody nose, or sore throat. Respiratory: Denies difficulty breathing, shortness of breath, cough or sputum production.   Cardiovascular: Denies chest pain, chest tightness, palpitations or swelling in the hands or feet.   Gastrointestinal: Patient reports constipation.  Denies abdominal pain, bloating, diarrhea or blood in the stool.  GU: Denies urgency, frequency, pain with urination, burning sensation, blood in urine, odor or discharge. Musculoskeletal: Patient reports chronic low back pain.  Denies decrease in range of motion, difficulty with gait, muscle pain or joint swelling.  Skin: Denies redness, rashes, lesions or ulcercations.  Neurological: Denies dizziness, difficulty with memory, difficulty with speech or problems with balance and coordination.  Psych: Patient has a history of anxiety and depression.  Denies SI/HI.  No other specific complaints in a complete review of systems (except as listed in HPI above).  Objective:   Physical Exam   LMP 01/20/2016  Wt Readings from Last 3 Encounters:  06/01/22 240 lb 6.4 oz (109 kg)  06/01/22 238 lb (108 kg)  05/05/22 234 lb (106.1 kg)    General: Appears their stated age, well developed, well nourished in NAD. Skin: Warm, dry and intact. No rashes, lesions or ulcerations noted. HEENT: Head: normal shape and size; Eyes: sclera white, no icterus, conjunctiva pink, PERRLA and EOMs intact; Ears: Tm's gray and intact, normal light reflex; Nose: mucosa pink and moist, septum midline; Throat/Mouth: Teeth present, mucosa pink and moist, no exudate, lesions or ulcerations noted.  Neck:  Neck supple, trachea midline. No masses, lumps or thyromegaly present.  Cardiovascular: Normal rate and rhythm. S1,S2 noted.  No murmur, rubs or gallops noted. No JVD or BLE edema. No carotid bruits noted. Pulmonary/Chest: Normal effort and positive vesicular breath sounds. No respiratory distress. No wheezes, rales or ronchi noted.  Abdomen: Soft and nontender. Normal bowel sounds. No distention or masses noted. Liver, spleen and kidneys non palpable. Musculoskeletal: Normal range of motion. No signs of joint swelling. No difficulty with gait.  Neurological: Alert and oriented.  Cranial nerves II-XII grossly intact. Coordination normal.  Psychiatric: Mood and affect normal. Behavior is normal. Judgment and thought content normal.     BMET    Component Value Date/Time   NA 143 12/15/2021 1003   K 3.8 12/15/2021 1003   CL 105 12/15/2021 1003   CO2 25 12/15/2021 1003   GLUCOSE 135 (H) 12/15/2021 1003   BUN 12 12/15/2021 1003   CREATININE 0.64 12/15/2021 1003   CALCIUM 9.2 12/15/2021 1003   GFRNONAA >60 07/02/2021 2000   GFRAA >60 07/27/2017 2153    Lipid Panel     Component Value Date/Time   CHOL 220 (H) 12/15/2021 1003   CHOL 189 07/28/2021 1553   TRIG 88 12/15/2021 1003   TRIG 120 07/28/2021 1553  HDL 23 (L) 12/15/2021 1003   CHOLHDL 9.6 (H) 12/15/2021 1003   VLDL 21.0 02/09/2019 1245   LDLCALC 177 (H) 12/15/2021 1003    CBC    Component Value Date/Time   WBC 3.9 (L) 07/02/2021 2000   RBC 4.73 07/02/2021 2000   HGB 14.1 07/02/2021 2000   HCT 41.5 07/02/2021 2000   PLT 219 07/02/2021 2000   MCV 87.7 07/02/2021 2000   MCH 29.8 07/02/2021 2000   MCHC 34.0 07/02/2021 2000   RDW 13.5 07/02/2021 2000   LYMPHSABS 1.1 07/28/2016 1232   MONOABS 0.4 07/28/2016 1232   EOSABS 0.0 07/28/2016 1232   BASOSABS 0.0 07/28/2016 1232    Hgb A1C Lab Results  Component Value Date   HGBA1C 6.0 (H) 12/15/2021           Assessment & Plan:   Preventative Health Maintenance:  Encouraged her to get a flu shot in the fall Tetanus UTD Encouraged her to get a COVID-vaccine Discussed Shingrix vaccine, she will check coverage with her insurance company and schedule visit if she would like to have this done She no longer needs to screen for cervical cancer Mammogram and bone density ordered-she will call to schedule Colon screening UTD Encouraged her to consume a balanced diet and exercise regimen Advised her seeing eye doctor and dentist annually We will check CBC, c-Met, lipid, A1c today  RTC in 6 months, follow-up chronic conditions Webb Silversmith, NP

## 2022-06-26 ENCOUNTER — Encounter: Payer: Self-pay | Admitting: Internal Medicine

## 2022-06-26 ENCOUNTER — Ambulatory Visit (INDEPENDENT_AMBULATORY_CARE_PROVIDER_SITE_OTHER): Payer: BC Managed Care – PPO | Admitting: Internal Medicine

## 2022-06-26 ENCOUNTER — Other Ambulatory Visit (HOSPITAL_COMMUNITY)
Admission: RE | Admit: 2022-06-26 | Discharge: 2022-06-26 | Disposition: A | Discharge status: Home / Self Care | Payer: BC Managed Care – PPO | Source: Ambulatory Visit | Attending: Internal Medicine | Admitting: Internal Medicine

## 2022-06-26 VITALS — BP 124/76 | HR 78 | Temp 96.8°F | Ht 67.0 in | Wt 247.0 lb

## 2022-06-26 DIAGNOSIS — N949 Unspecified condition associated with female genital organs and menstrual cycle: Secondary | ICD-10-CM | POA: Insufficient documentation

## 2022-06-26 DIAGNOSIS — M79672 Pain in left foot: Secondary | ICD-10-CM

## 2022-06-26 DIAGNOSIS — E7800 Pure hypercholesterolemia, unspecified: Secondary | ICD-10-CM | POA: Diagnosis not present

## 2022-06-26 DIAGNOSIS — E66812 Obesity, class 2: Secondary | ICD-10-CM

## 2022-06-26 DIAGNOSIS — Z6838 Body mass index (BMI) 38.0-38.9, adult: Secondary | ICD-10-CM | POA: Diagnosis not present

## 2022-06-26 DIAGNOSIS — N9489 Other specified conditions associated with female genital organs and menstrual cycle: Secondary | ICD-10-CM

## 2022-06-26 DIAGNOSIS — Z0001 Encounter for general adult medical examination with abnormal findings: Secondary | ICD-10-CM

## 2022-06-26 DIAGNOSIS — R7303 Prediabetes: Secondary | ICD-10-CM

## 2022-06-26 DIAGNOSIS — Z1231 Encounter for screening mammogram for malignant neoplasm of breast: Secondary | ICD-10-CM | POA: Diagnosis not present

## 2022-06-26 DIAGNOSIS — Z780 Asymptomatic menopausal state: Secondary | ICD-10-CM

## 2022-06-26 LAB — POCT URINALYSIS DIPSTICK
Bilirubin, UA: NEGATIVE
Blood, UA: NEGATIVE
Glucose, UA: NEGATIVE
Ketones, UA: NEGATIVE
Leukocytes, UA: NEGATIVE
Nitrite, UA: NEGATIVE
Protein, UA: NEGATIVE
Spec Grav, UA: 1.01 (ref 1.010–1.025)
Urobilinogen, UA: 0.2 E.U./dL
pH, UA: 7.5 (ref 5.0–8.0)

## 2022-06-26 MED ORDER — MELOXICAM 15 MG PO TABS: 15.0000 mg | ORAL_TABLET | Freq: Every day | ORAL | 1 refills | Status: DC

## 2022-06-26 NOTE — Progress Notes (Unsigned)
Subjective:    Patient ID: Diana [name], female    DOB: 13-Nov-1960, 62 y.o.   MRN: [id number]  HPI  Patient presents to clinic today for annual exam.  Flu: Never Tetanus: 03/2015 COVID: Never Shingrix: Never Pap smear: Hysterectomy Mammogram: >2 years ago Bone density: Never Colon screening: 10/2021 Vision screening: annually Dentist: biannually  Diet: She does eat meat. She consumes some fruits and veggies. She does eat some fried foods. She drinks mostly tea. Exercise: None  Review of Systems     Past Medical History:  Diagnosis Date   Allergy    Ear infection    recurrent   Hypertension     Current Outpatient Medications  Medication Sig Dispense Refill   Acetaminophen (TYLENOL ARTHRITIS PAIN PO) Take by mouth.     Ascorbic Acid 125 MG CHEW Chew 1 each by mouth daily.     atorvastatin (LIPITOR) 10 MG tablet Take 1 tablet (10 mg total) by mouth daily. (Patient taking differently: Take 5 mg by mouth daily. Three times a week.) 90 tablet 1   cetirizine (ZYRTEC) 10 MG tablet Take 10 mg by mouth daily as needed.     Cholecalciferol (VITAMIN D3) 25 MCG (1000 UT) CAPS Take 5,000 Units by mouth.      clonazePAM (KLONOPIN) 0.5 MG tablet TAKE 1 TABLET BY MOUTH DAILY AS NEEDED FOR ANXIETY 30 tablet 0   fluconazole (DIFLUCAN) 150 MG tablet Take one tablet by mouth on Day 1. Repeat dose 2nd tablet on Day 3. 2 tablet 0   hydrocortisone (ANUSOL-HC) 2.5 % rectal cream PLACE 1 APPLICATION RECTALLY 2 TIMES DAILY. (Patient not taking: Reported on 06/01/2022) 30 g 0   ibuprofen (ADVIL) 200 MG tablet Take 400-600 mg by mouth every 6 (six) hours as needed.     losartan-hydrochlorothiazide (HYZAAR) 50-12.5 MG tablet TAKE 1 TABLET BY MOUTH DAILY. 90 tablet 1   methocarbamol (ROBAXIN) 500 MG tablet TAKE 1 TABLET BY MOUTH EVERY 8 HOURS AS NEEDED FOR MUSCLE SPASMS. 30 tablet 0   metroNIDAZOLE (FLAGYL) 500 MG tablet Take 1 tablet (500 mg total) by mouth 2 (two) times daily. Do not drink alcohol  while taking this medicine. 14 tablet 0   omeprazole (PRILOSEC) 40 MG capsule Take 1 capsule (40 mg total) by mouth 2 (two) times daily. 180 capsule 1   triamcinolone cream (KENALOG) 0.1 % Apply topically.     Turmeric (QC TUMERIC COMPLEX) 500 MG CAPS Take by mouth.     No current facility-administered medications for this visit.    No Known Allergies  Family History  Problem Relation Age of Onset   Hypertension Mother        alive   Arthritis Mother    Lung cancer Father        deceased   Heart disease Father    Hypertension Maternal Grandmother    Other Other        1 brother and 1 sister both alive an healthy    Social History   Socioeconomic History   Marital status: Widowed    Spouse name: Not on file   Number of children: 3   Years of education: Not on file   Highest education level: Not on file  Occupational History   Occupation: customer service    Employer: [employer]  Tobacco Use   Smoking status: Former    Types: Cigarettes    Quit date: 04/20/1985    Years since quitting: 37.2    Passive  exposure: Past   Smokeless tobacco: Never   Tobacco comments:    remote tobacco use   Vaping Use   Vaping Use: Never used  Substance and Sexual Activity   Alcohol use: No    Alcohol/week: 0.0 standard drinks of alcohol   Drug use: No   Sexual activity: Not Currently  Other Topics Concern   Not on file  Social History Narrative   Not on file   Social Determinants of Health   Financial Resource Strain: Not on file  Food Insecurity: Not on file  Transportation Needs: Not on file  Physical Activity: Not on file  Stress: Not on file  Social Connections: Not on file  Intimate Partner Violence: Not on file     Constitutional: Denies fever, malaise, fatigue, headache or abrupt weight changes.  HEENT: Denies eye pain, eye redness, ear pain, ringing in the ears, wax buildup, runny nose, nasal congestion, bloody nose, or sore throat. Respiratory: Denies  difficulty breathing, shortness of breath, cough or sputum production.   Cardiovascular: Denies chest pain, chest tightness, palpitations or swelling in the hands or feet.  Gastrointestinal: Patient reports constipation, intermittent RUQ pain.  Denies abdominal pain, bloating, diarrhea or blood in the stool.  GU: Pt reports vaginal burning. Denies urgency, frequency, pain with urination, burning sensation, blood in urine, odor or discharge. Musculoskeletal: Patient reports chronic back pain, left heel pain. .  Denies decrease in range of motion, difficulty with gait, or joint swelling.  Skin: Denies redness, rashes, lesions or ulcercations.  Neurological: Denies dizziness, difficulty with memory, difficulty with speech or problems with balance and coordination.  Psych: Patient has a history of anxiety and depression.  Denies SI/HI.  No other specific complaints in a complete review of systems (except as listed in HPI above).  Objective:   Physical Exam   BP 124/76 (BP Location: Left Arm, Patient Position: Sitting, Cuff Size: Large)   Pulse 78   Temp (!) 96.8 F (36 C) (Temporal)   Ht '5\' 7"'$  (1.702 m)   Wt 247 lb (112 kg)   LMP 01/20/2016   SpO2 98%   BMI 38.69 kg/m   Wt Readings from Last 3 Encounters:  06/01/22 240 lb 6.4 oz (109 kg)  06/01/22 238 lb (108 kg)  05/05/22 234 lb (106.1 kg)    General: Appears her stated age, obese, in NAD. Skin: Warm, dry and intact. No rashes noted. HEENT: Head: normal shape and size; Eyes: sclera white, no icterus, conjunctiva pink, PERRLA and EOMs intact;  Neck:  Neck supple, trachea midline. No masses, lumps or thyromegaly present.  Cardiovascular: Normal rate and rhythm. S1,S2 noted.  No murmur, rubs or gallops noted. No JVD or BLE edema. No carotid bruits noted. Pulmonary/Chest: Normal effort and positive vesicular breath sounds. No respiratory distress. No wheezes, rales or ronchi noted.  Abdomen: Soft and nontender. Normal bowel sounds.   Musculoskeletal: Pain with palpation over the lumbar spine. Prominence noted over the right posterior heel. No pain with palpation over the achilles. Strength 5/5 BUE/BLE. No difficulty with gait.  Neurological: Alert and oriented. Cranial nerves II-XII grossly intact. Coordination normal.  Psychiatric: Mood and affect normal. Behavior is normal. Judgment and thought content normal.     BMET    Component Value Date/Time   NA 143 12/15/2021 1003   K 3.8 12/15/2021 1003   CL 105 12/15/2021 1003   CO2 25 12/15/2021 1003   GLUCOSE 135 (H) 12/15/2021 1003   BUN 12 12/15/2021 1003  CREATININE 0.64 12/15/2021 1003   CALCIUM 9.2 12/15/2021 1003   GFRNONAA >60 07/02/2021 2000   GFRAA >60 07/27/2017 2153    Lipid Panel     Component Value Date/Time   CHOL 220 (H) 12/15/2021 1003   CHOL 189 07/28/2021 1553   TRIG 88 12/15/2021 1003   TRIG 120 07/28/2021 1553   HDL 23 (L) 12/15/2021 1003   CHOLHDL 9.6 (H) 12/15/2021 1003   VLDL 21.0 02/09/2019 1245   LDLCALC 177 (H) 12/15/2021 1003    CBC    Component Value Date/Time   WBC 3.9 (L) 07/02/2021 2000   RBC 4.73 07/02/2021 2000   HGB 14.1 07/02/2021 2000   HCT 41.5 07/02/2021 2000   PLT 219 07/02/2021 2000   MCV 87.7 07/02/2021 2000   MCH 29.8 07/02/2021 2000   MCHC 34.0 07/02/2021 2000   RDW 13.5 07/02/2021 2000   LYMPHSABS 1.1 07/28/2016 1232   MONOABS 0.4 07/28/2016 1232   EOSABS 0.0 07/28/2016 1232   BASOSABS 0.0 07/28/2016 1232    Hgb A1C Lab Results  Component Value Date   HGBA1C 6.0 (H) 12/15/2021           Assessment & Plan:   Preventative Health Maintenance:  Encouraged her to get a flu shot in the fall Tetanus UTD Encouraged her to get her COVID booster Discussed Shingrix vaccine, she will check coverage with her insurance company and schedule a visit if she would like to have this done She no longer needs to screen for cervical cancer Mammogram and bone density ordered-she will call to  schedule Colon screening UTD Encouraged her to consume a balanced diet and exercise regimen Encouraged her to see an eye doctor and dentist annually We will check CBC, c-Met, lipid, A1c today  Left Heel Pain:  Bone spur versus Achilles tendinitis X-ray left foot ordered Rx for Meloxicam 50 mg daily-avoid other anti-inflammatories OTC Consider referral to podiatry for further evaluation and treatment  Vaginal Burning:  Will check wet prep for BV and yeast Urinalysis pending RTC in 6 months, follow-up chronic conditions Webb Silversmith, NP

## 2022-06-26 NOTE — Patient Instructions (Signed)
Health Maintenance for Postmenopausal Women Menopause is a normal process in which your ability to get pregnant comes to an end. This process happens slowly over many months or years, usually between the ages of 48 and 55. Menopause is complete when you have missed your menstrual period for 12 months. It is important to talk with your health care provider about some of the most common conditions that affect women after menopause (postmenopausal women). These include heart disease, cancer, and bone loss (osteoporosis). Adopting a healthy lifestyle and getting preventive care can help to promote your health and wellness. The actions you take can also lower your chances of developing some of these common conditions. What are the signs and symptoms of menopause? During menopause, you may have the following symptoms: Hot flashes. These can be moderate or severe. Night sweats. Decrease in sex drive. Mood swings. Headaches. Tiredness (fatigue). Irritability. Memory problems. Problems falling asleep or staying asleep. Talk with your health care provider about treatment options for your symptoms. Do I need hormone replacement therapy? Hormone replacement therapy is effective in treating symptoms that are caused by menopause, such as hot flashes and night sweats. Hormone replacement carries certain risks, especially as you become older. If you are thinking about using estrogen or estrogen with progestin, discuss the benefits and risks with your health care provider. How can I reduce my risk for heart disease and stroke? The risk of heart disease, heart attack, and stroke increases as you age. One of the causes may be a change in the body's hormones during menopause. This can affect how your body uses dietary fats, triglycerides, and cholesterol. Heart attack and stroke are medical emergencies. There are many things that you can do to help prevent heart disease and stroke. Watch your blood pressure High  blood pressure causes heart disease and increases the risk of stroke. This is more likely to develop in people who have high blood pressure readings or are overweight. Have your blood pressure checked: Every 3-5 years if you are 18-39 years of age. Every year if you are 40 years old or older. Eat a healthy diet  Eat a diet that includes plenty of vegetables, fruits, low-fat dairy products, and lean protein. Do not eat a lot of foods that are high in solid fats, added sugars, or sodium. Get regular exercise Get regular exercise. This is one of the most important things you can do for your health. Most adults should: Try to exercise for at least 150 minutes each week. The exercise should increase your heart rate and make you sweat (moderate-intensity exercise). Try to do strengthening exercises at least twice each week. Do these in addition to the moderate-intensity exercise. Spend less time sitting. Even light physical activity can be beneficial. Other tips Work with your health care provider to achieve or maintain a healthy weight. Do not use any products that contain nicotine or tobacco. These products include cigarettes, chewing tobacco, and vaping devices, such as e-cigarettes. If you need help quitting, ask your health care provider. Know your numbers. Ask your health care provider to check your cholesterol and your blood sugar (glucose). Continue to have your blood tested as directed by your health care provider. Do I need screening for cancer? Depending on your health history and family history, you may need to have cancer screenings at different stages of your life. This may include screening for: Breast cancer. Cervical cancer. Lung cancer. Colorectal cancer. What is my risk for osteoporosis? After menopause, you may be   at increased risk for osteoporosis. Osteoporosis is a condition in which bone destruction happens more quickly than new bone creation. To help prevent osteoporosis or  the bone fractures that can happen because of osteoporosis, you may take the following actions: If you are 19-50 years old, get at least 1,000 mg of calcium and at least 600 international units (IU) of vitamin D per day. If you are older than age 50 but younger than age 70, get at least 1,200 mg of calcium and at least 600 international units (IU) of vitamin D per day. If you are older than age 70, get at least 1,200 mg of calcium and at least 800 international units (IU) of vitamin D per day. Smoking and drinking excessive alcohol increase the risk of osteoporosis. Eat foods that are rich in calcium and vitamin D, and do weight-bearing exercises several times each week as directed by your health care provider. How does menopause affect my mental health? Depression may occur at any age, but it is more common as you become older. Common symptoms of depression include: Feeling depressed. Changes in sleep patterns. Changes in appetite or eating patterns. Feeling an overall lack of motivation or enjoyment of activities that you previously enjoyed. Frequent crying spells. Talk with your health care provider if you think that you are experiencing any of these symptoms. General instructions See your health care provider for regular wellness exams and vaccines. This may include: Scheduling regular health, dental, and eye exams. Getting and maintaining your vaccines. These include: Influenza vaccine. Get this vaccine each year before the flu season begins. Pneumonia vaccine. Shingles vaccine. Tetanus, diphtheria, and pertussis (Tdap) booster vaccine. Your health care provider may also recommend other immunizations. Tell your health care provider if you have ever been abused or do not feel safe at home. Summary Menopause is a normal process in which your ability to get pregnant comes to an end. This condition causes hot flashes, night sweats, decreased interest in sex, mood swings, headaches, or lack  of sleep. Treatment for this condition may include hormone replacement therapy. Take actions to keep yourself healthy, including exercising regularly, eating a healthy diet, watching your weight, and checking your blood pressure and blood sugar levels. Get screened for cancer and depression. Make sure that you are up to date with all your vaccines. This information is not intended to replace advice given to you by your health care provider. Make sure you discuss any questions you have with your health care provider. Document Revised: 08/26/2020 Document Reviewed: 08/26/2020 Elsevier Patient Education  2023 [employer] Inc[employer]  

## 2022-06-26 NOTE — Assessment & Plan Note (Signed)
Encouraged diet and exercise for weight loss ?

## 2022-06-27 LAB — LIPID PANEL
Cholesterol: 144 mg/dL (ref <200)
HDL: 16 mg/dL — ABNORMAL LOW (ref >=50)
LDL Cholesterol (Calc): 103 mg/dL (calc) — ABNORMAL HIGH
Non-HDL Cholesterol (Calc): 128 mg/dL (calc) (ref <130)
Total CHOL/HDL Ratio: 9 (calc) — ABNORMAL HIGH (ref <5.0)
Triglycerides: 158 mg/dL — ABNORMAL HIGH (ref <150)

## 2022-06-27 LAB — COMPLETE METABOLIC PANEL WITH GFR
AG Ratio: 1.8 (calc) (ref 1.0–2.5)
ALT: 14 U/L (ref 6–29)
AST: 14 U/L (ref 10–35)
Albumin: 4.4 g/dL (ref 3.6–5.1)
Alkaline phosphatase (APISO): 93 U/L (ref 37–153)
BUN: 9 mg/dL (ref 7–25)
CO2: 31 mmol/L (ref 20–32)
Calcium: 9.2 mg/dL (ref 8.6–10.4)
Chloride: 99 mmol/L (ref 98–110)
Creat: 0.71 mg/dL (ref 0.50–1.03)
Globulin: 2.5 g/dL (calc) (ref 1.9–3.7)
Glucose, Bld: 120 mg/dL — ABNORMAL HIGH (ref 65–99)
Potassium: 3.4 mmol/L — ABNORMAL LOW (ref 3.5–5.3)
Sodium: 141 mmol/L (ref 135–146)
Total Bilirubin: 0.8 mg/dL (ref 0.2–1.2)
Total Protein: 6.9 g/dL (ref 6.1–8.1)
eGFR: 98 mL/min/{1.73_m2} (ref >=60)

## 2022-06-27 LAB — CBC
HCT: 39.9 % (ref 35.0–45.0)
Hemoglobin: 13.1 g/dL (ref 11.7–15.5)
MCH: 30 pg (ref 27.0–33.0)
MCHC: 32.8 g/dL (ref 32.0–36.0)
MCV: 91.3 fL (ref 80.0–100.0)
MPV: 11.3 fL (ref 7.5–12.5)
Platelets: 257 10*3/uL (ref 140–400)
RBC: 4.37 10*6/uL (ref 3.80–5.10)
RDW: 12.9 % (ref 11.0–15.0)
WBC: 6.1 10*3/uL (ref 3.8–10.8)

## 2022-06-27 LAB — HEMOGLOBIN A1C
Hgb A1c MFr Bld: 7 % of total Hgb — ABNORMAL HIGH (ref <5.7)
Mean Plasma Glucose: 154 mg/dL
eAG (mmol/L): 8.5 mmol/L

## 2022-06-29 ENCOUNTER — Telehealth: Payer: Self-pay | Admitting: Internal Medicine

## 2022-06-29 DIAGNOSIS — K219 Gastro-esophageal reflux disease without esophagitis: Secondary | ICD-10-CM

## 2022-06-29 MED ORDER — CLONAZEPAM 0.5 MG PO TABS: 0.5000 mg | ORAL_TABLET | Freq: Every day | ORAL | 0 refills | Status: DC | PRN

## 2022-06-29 MED ORDER — HYDROCORTISONE (PERIANAL) 2.5 % EX CREA: 1.0000 | TOPICAL_CREAM | Freq: Two times a day (BID) | CUTANEOUS | 0 refills | Status: DC

## 2022-06-29 NOTE — Addendum Note (Signed)
Addended by: Jearld Fenton on: 06/29/2022 01:58 PM   Modules accepted: Orders

## 2022-06-29 NOTE — Telephone Encounter (Signed)
Medication Refill - Medication: clonazePAM (KLONOPIN) 0.5 MG tablet   hydrocortisone (ANUSOL-HC) 2.5 % rectal cream   Has the patient contacted their pharmacy? No. (Agent: If no, request that the patient contact the pharmacy for the refill. If patient does not wish to contact the pharmacy document the reason why and proceed with request.) (Agent: If yes, when and what did the pharmacy advise?)  Preferred Pharmacy (with phone number or street name): [facility], [place]  Has the patient been seen for an appointment in the last year OR does the patient have an upcoming appointment? Yes.    Agent: Please be advised that RX refills may take up to 3 business days. We ask that you follow-up with your pharmacy.

## 2022-06-30 ENCOUNTER — Encounter: Payer: Self-pay | Admitting: Internal Medicine

## 2022-06-30 LAB — CERVICOVAGINAL ANCILLARY ONLY
Bacterial Vaginitis (gardnerella): POSITIVE — AB
Candida Glabrata: NEGATIVE
Candida Vaginitis: NEGATIVE
Comment: NEGATIVE
Comment: NEGATIVE
Comment: NEGATIVE

## 2022-06-30 MED ORDER — METRONIDAZOLE 500 MG PO TABS: 500.0000 mg | ORAL_TABLET | Freq: Two times a day (BID) | ORAL | 0 refills | Status: DC

## 2022-06-30 NOTE — Telephone Encounter (Signed)
Pt stated PCP sent her a message that she was positive for BV. Pt stated she would like oral medication sent to the pharmacy below.   [place], [address]  [place] [address]  Phone: [phone] Fax: [phone]  Hours: Not open 24 hours   Please advise.

## 2022-07-02 ENCOUNTER — Encounter: Payer: Self-pay | Admitting: Internal Medicine

## 2022-07-02 ENCOUNTER — Ambulatory Visit: Payer: BC Managed Care – PPO | Admitting: Internal Medicine

## 2022-07-02 VITALS — BP 126/76 | HR 83 | Temp 97.3°F | Wt 243.0 lb

## 2022-07-02 DIAGNOSIS — E1169 Type 2 diabetes mellitus with other specified complication: Secondary | ICD-10-CM

## 2022-07-02 DIAGNOSIS — E119 Type 2 diabetes mellitus without complications: Secondary | ICD-10-CM

## 2022-07-02 DIAGNOSIS — E785 Hyperlipidemia, unspecified: Secondary | ICD-10-CM

## 2022-07-02 DIAGNOSIS — Z6838 Body mass index (BMI) 38.0-38.9, adult: Secondary | ICD-10-CM

## 2022-07-02 NOTE — Progress Notes (Signed)
Subjective:    Patient ID: Diana [name], female    DOB: 10-17-1960, 62 y.o.   MRN: [id number]  HPI  Patient presents to clinic today to discuss her recent lab results.  Her recent A1c was 7% which indicates a new onset diabetes.  Her LDL was 103, triglycerides 158.  She is currently taking Atorvastatin 5 mg daily.  She reports her diet consist of processed foods and fast foods.  She does not drink any water.  She is not currently exercising.  Review of Systems  Past Medical History:  Diagnosis Date   Allergy    Ear infection    recurrent   Hypertension     Current Outpatient Medications  Medication Sig Dispense Refill   Acetaminophen (TYLENOL ARTHRITIS PAIN PO) Take by mouth.     Ascorbic Acid 125 MG CHEW Chew 1 each by mouth daily.     atorvastatin (LIPITOR) 10 MG tablet Take 1 tablet (10 mg total) by mouth daily. (Patient taking differently: Take 5 mg by mouth daily.) 90 tablet 1   cetirizine (ZYRTEC) 10 MG tablet Take 10 mg by mouth daily as needed.     Cholecalciferol (VITAMIN D3) 25 MCG (1000 UT) CAPS Take 5,000 Units by mouth.      clonazePAM (KLONOPIN) 0.5 MG tablet Take 1 tablet (0.5 mg total) by mouth daily as needed. for anxiety 30 tablet 0   hydrocortisone (ANUSOL-HC) 2.5 % rectal cream Place 1 Application rectally 2 (two) times daily. 30 g 0   ibuprofen (ADVIL) 200 MG tablet Take 400-600 mg by mouth every 6 (six) hours as needed.     losartan-hydrochlorothiazide (HYZAAR) 50-12.5 MG tablet TAKE 1 TABLET BY MOUTH DAILY. 90 tablet 1   meloxicam (MOBIC) 15 MG tablet Take 1 tablet (15 mg total) by mouth daily. 90 tablet 1   methocarbamol (ROBAXIN) 500 MG tablet TAKE 1 TABLET BY MOUTH EVERY 8 HOURS AS NEEDED FOR MUSCLE SPASMS. 30 tablet 0   metroNIDAZOLE (FLAGYL) 500 MG tablet Take 1 tablet (500 mg total) by mouth 2 (two) times daily. Do not drink alcohol while taking this medicine. 14 tablet 0   omeprazole (PRILOSEC) 40 MG capsule Take 1 capsule (40 mg total) by mouth 2  (two) times daily. 180 capsule 1   Turmeric (QC TUMERIC COMPLEX) 500 MG CAPS Take by mouth.     No current facility-administered medications for this visit.    No Known Allergies  Family History  Problem Relation Age of Onset   Hypertension Mother        alive   Arthritis Mother    Thyroid disease Mother    Lung cancer Father        deceased   Heart disease Father    Hypertension Maternal Grandmother     Social History   Socioeconomic History   Marital status: Widowed    Spouse name: Not on file   Number of children: 3   Years of education: Not on file   Highest education level: Not on file  Occupational History   Occupation: customer service    Employer: [employer]  Tobacco Use   Smoking status: Former    Types: Cigarettes    Quit date: 04/20/1985    Years since quitting: 37.2    Passive exposure: Past   Smokeless tobacco: Never   Tobacco comments:    remote tobacco use   Vaping Use   Vaping Use: Never used  Substance and Sexual Activity   Alcohol  use: No    Alcohol/week: 0.0 standard drinks of alcohol   Drug use: No   Sexual activity: Not Currently  Other Topics Concern   Not on file  Social History Narrative   Not on file   Social Determinants of Health   Financial Resource Strain: Not on file  Food Insecurity: Not on file  Transportation Needs: Not on file  Physical Activity: Not on file  Stress: Not on file  Social Connections: Not on file  Intimate Partner Violence: Not on file     Constitutional: Denies fever, malaise, fatigue, headache or abrupt weight changes.  Respiratory: Denies difficulty breathing, shortness of breath, cough or sputum production.   Cardiovascular: Denies chest pain, chest tightness, palpitations or swelling in the hands or feet.  Gastrointestinal: Denies abdominal pain, bloating, constipation, diarrhea or blood in the stool.  GU: Denies urgency, frequency, pain with urination, burning sensation, blood in urine, odor or  discharge. Skin: Denies redness, rashes, lesions or ulcercations.  Neurological: Denies dizziness, difficulty with memory, difficulty with speech or problems with balance and coordination.   No other specific complaints in a complete review of systems (except as listed in HPI above).     Objective:   Physical Exam BP 126/76 (BP Location: Right Arm, Patient Position: Sitting, Cuff Size: Large)   Pulse 83   Temp (!) 97.3 F (36.3 C) (Temporal)   Wt 243 lb (110.2 kg)   LMP 01/20/2016   SpO2 99%   BMI 38.06 kg/m   Wt Readings from Last 3 Encounters:  06/26/22 247 lb (112 kg)  06/01/22 240 lb 6.4 oz (109 kg)  06/01/22 238 lb (108 kg)    General: Appears her stated age, obese, in NAD. Skin: Warm, dry and intact. No ulcerations noted. HEENT: Head: normal shape and size; Eyes: sclera white, no icterus, conjunctiva pink, PERRLA and EOMs intact;  Cardiovascular: Normal rate and rhythm.  Pulmonary/Chest: Normal effort and positive vesicular breath sounds. No respiratory distress. No wheezes, rales or ronchi noted.  Neurological: Alert and oriented.   BMET    Component Value Date/Time   NA 141 06/26/2022 1539   K 3.4 (L) 06/26/2022 1539   CL 99 06/26/2022 1539   CO2 31 06/26/2022 1539   GLUCOSE 120 (H) 06/26/2022 1539   BUN 9 06/26/2022 1539   CREATININE 0.71 06/26/2022 1539   CALCIUM 9.2 06/26/2022 1539   GFRNONAA >60 07/02/2021 2000   GFRAA >60 07/27/2017 2153    Lipid Panel     Component Value Date/Time   CHOL 144 06/26/2022 1539   CHOL 189 07/28/2021 1553   TRIG 158 (H) 06/26/2022 1539   TRIG 120 07/28/2021 1553   HDL 16 (L) 06/26/2022 1539   CHOLHDL 9.0 (H) 06/26/2022 1539   VLDL 21.0 02/09/2019 1245   LDLCALC 103 (H) 06/26/2022 1539    CBC    Component Value Date/Time   WBC 6.1 06/26/2022 1539   RBC 4.37 06/26/2022 1539   HGB 13.1 06/26/2022 1539   HCT 39.9 06/26/2022 1539   PLT 257 06/26/2022 1539   MCV 91.3 06/26/2022 1539   MCH 30.0 06/26/2022 1539    MCHC 32.8 06/26/2022 1539   RDW 12.9 06/26/2022 1539   LYMPHSABS 1.1 07/28/2016 1232   MONOABS 0.4 07/28/2016 1232   EOSABS 0.0 07/28/2016 1232   BASOSABS 0.0 07/28/2016 1232    Hgb A1C Lab Results  Component Value Date   HGBA1C 7.0 (H) 06/26/2022  Assessment & Plan:    RTC in 3 months, follow-up chronic conditions Webb Silversmith, NP

## 2022-07-02 NOTE — Assessment & Plan Note (Signed)
Encourage diet and exercise for weight loss 

## 2022-07-02 NOTE — Assessment & Plan Note (Signed)
Encourage low-fat diet and exercise for weight loss Continue rosuvastatin for now

## 2022-07-02 NOTE — Patient Instructions (Signed)

## 2022-07-02 NOTE — Assessment & Plan Note (Signed)
Discussed diabetes and standards of medical care Encourage low-carb diet and exercise for weight loss Referral to diabetes education and nutrition She would like to hold off on medication at this time and reevaluate in 3 months

## 2022-07-07 ENCOUNTER — Ambulatory Visit (INDEPENDENT_AMBULATORY_CARE_PROVIDER_SITE_OTHER): Payer: BC Managed Care – PPO | Admitting: Podiatry

## 2022-07-07 DIAGNOSIS — M722 Plantar fascial fibromatosis: Secondary | ICD-10-CM | POA: Diagnosis not present

## 2022-07-07 DIAGNOSIS — M7662 Achilles tendinitis, left leg: Secondary | ICD-10-CM

## 2022-07-07 NOTE — Progress Notes (Signed)
Subjective:  Patient ID: Diana [name], female    DOB: 1961-01-06,  MRN: [id number]  Chief Complaint  Patient presents with   Plantar Fasciitis    62 y.o. female presents with the above complaint.  Patient presents with right heel pain that has been on for quite some time is progressive gotten worse worse with ambulation worse with pressure she also has secondary complaint left posterior heel pain/Achilles tendinitis pain.  She states that is also acting up as well.  Hurts with ambulation worse with pressure pain scale 7 out of 10 she would like to discuss treatment options for both of these she has not seen and was prior to seeing me.   Review of Systems: Negative except as noted in the HPI. Denies N/V/F/Ch.  Past Medical History:  Diagnosis Date   Allergy    Ear infection    recurrent   Hypertension     Current Outpatient Medications:    Acetaminophen (TYLENOL ARTHRITIS PAIN PO), Take by mouth., Disp: , Rfl:    Ascorbic Acid 125 MG CHEW, Chew 1 each by mouth daily., Disp: , Rfl:    atorvastatin (LIPITOR) 10 MG tablet, Take 1 tablet (10 mg total) by mouth daily. (Patient taking differently: Take 5 mg by mouth daily.), Disp: 90 tablet, Rfl: 1   cetirizine (ZYRTEC) 10 MG tablet, Take 10 mg by mouth daily as needed., Disp: , Rfl:    Cholecalciferol (VITAMIN D3) 25 MCG (1000 UT) CAPS, Take 5,000 Units by mouth. , Disp: , Rfl:    clonazePAM (KLONOPIN) 0.5 MG tablet, Take 1 tablet (0.5 mg total) by mouth daily as needed. for anxiety, Disp: 30 tablet, Rfl: 0   hydrocortisone (ANUSOL-HC) 2.5 % rectal cream, Place 1 Application rectally 2 (two) times daily., Disp: 30 g, Rfl: 0   ibuprofen (ADVIL) 200 MG tablet, Take 400-600 mg by mouth every 6 (six) hours as needed., Disp: , Rfl:    losartan-hydrochlorothiazide (HYZAAR) 50-12.5 MG tablet, TAKE 1 TABLET BY MOUTH DAILY., Disp: 90 tablet, Rfl: 1   meloxicam (MOBIC) 15 MG tablet, Take 1 tablet (15 mg total) by mouth daily., Disp: 90 tablet, Rfl:  1   methocarbamol (ROBAXIN) 500 MG tablet, TAKE 1 TABLET BY MOUTH EVERY 8 HOURS AS NEEDED FOR MUSCLE SPASMS., Disp: 30 tablet, Rfl: 0   metroNIDAZOLE (FLAGYL) 500 MG tablet, Take 1 tablet (500 mg total) by mouth 2 (two) times daily. Do not drink alcohol while taking this medicine., Disp: 14 tablet, Rfl: 0   omeprazole (PRILOSEC) 40 MG capsule, Take 1 capsule (40 mg total) by mouth 2 (two) times daily., Disp: 180 capsule, Rfl: 1   Turmeric (QC TUMERIC COMPLEX) 500 MG CAPS, Take by mouth., Disp: , Rfl:   Social History   Tobacco Use  Smoking Status Former   Types: Cigarettes   Quit date: 04/20/1985   Years since quitting: 37.2   Passive exposure: Past  Smokeless Tobacco Never  Tobacco Comments   remote tobacco use     No Known Allergies Objective:  There were no vitals filed for this visit. There is no height or weight on file to calculate BMI. Constitutional Well developed. Well nourished.  Vascular Dorsalis pedis pulses palpable bilaterally. Posterior tibial pulses palpable bilaterally. Capillary refill normal to all digits.  No cyanosis or clubbing noted. Pedal hair growth normal.  Neurologic Normal speech. Oriented to person, place, and time. Epicritic sensation to light touch grossly present bilaterally.  Dermatologic Nails well groomed and normal in appearance. No open  wounds. No skin lesions.  Orthopedic: Normal joint ROM without pain or crepitus bilaterally. No visible deformities. Tender to palpation at the calcaneal tuber right. No pain with calcaneal squeeze right. Ankle ROM diminished range of motion right. Silfverskiold Test: positive right.  Pain on palpation of left Achilles insertional pain.  Pain with dorsiflexion of the ankle joint no pain with plantarflexion of the ankle joint.  Clinically able to appreciate Haglund's deformity positive Silfverskiold test noted with gastrocnemius equinus to the left side as well   Radiographs: None Assessment:   1.  Plantar fasciitis of right foot   2. Left Achilles tendinitis    Plan:  Patient was evaluated and treated and all questions answered.  Left Achilles tendonitis -I explained the patient the etiology of Achilles tendinitis emergency room and options were discussed.  Given the amount of pain that she is having she will benefit from cam boot immobilization -Cam boot was dispensed.  I encouraged her to continue wearing it.  Plantar Fasciitis, right - XR reviewed as above.  - Educated on icing and stretching. Instructions given.  - Injection delivered to the plantar fascia as below. - DME: Plantar fascial brace dispensed to support the medial longitudinal arch of the foot and offload pressure from the heel and prevent arch collapse during weightbearing - Pharmacologic management: None  Procedure: Injection Tendon/Ligament Location: Right plantar fascia at the glabrous junction; medial approach. Skin Prep: alcohol Injectate: 0.5 cc 0.5% marcaine plain, 0.5 cc of 1% Lidocaine, 0.5 cc kenalog 10. Disposition: Patient tolerated procedure well. Injection site dressed with a band-aid.  No follow-ups on file.  Right Planter fasciitis injection brace left Achilles tendinitis cam boot

## 2022-07-15 ENCOUNTER — Other Ambulatory Visit: Payer: Self-pay | Admitting: Internal Medicine

## 2022-07-16 NOTE — Telephone Encounter (Signed)
Requested medication (s) are due for refill today:   Provider to review  Requested medication (s) are on the active medication list:   Yes  Future visit scheduled:   Yes    Seen 2 wks ago   Last ordered: 05/19/2022 #30, 0 refills  Non delegated refill    Requested Prescriptions  Pending Prescriptions Disp Refills   methocarbamol (ROBAXIN) 500 MG tablet [Pharmacy Med Name: METHOCARBAMOL 500 MG TABS 500 Tablet] 30 tablet 0    Sig: TAKE 1 TABLET BY MOUTH EVERY 8 HOURS AS NEEDED FOR MUSCLE SPASMS.     Not Delegated - Analgesics:  Muscle Relaxants Failed - 07/15/2022  3:12 PM      Failed - This refill cannot be delegated      Passed - Valid encounter within last 6 months    Recent Outpatient Visits           2 weeks ago Type 2 diabetes mellitus without complication, without long-term current use of insulin [facility])   [facility] [place], Coralie Keens, NP   2 weeks ago Encounter for general adult medical examination with abnormal findings   [facility] [place], Coralie Keens, NP   1 month ago Exposure to sexually transmitted disease (STD)   [place], DO   2 months ago Viral URI with cough   [facility] [place], Coralie Keens, NP   2 months ago Nasal congestion   [facility] [place], Coralie Keens, NP       Future Appointments             In 2 months [place], Coralie Keens, NP [facility], [place]   In 5 months [place], Coralie Keens, NP [facility], [facility]

## 2022-07-28 ENCOUNTER — Ambulatory Visit: Payer: Self-pay

## 2022-07-28 NOTE — Telephone Encounter (Signed)
Summary: Urinary burning/urgency   Patient states that she has had urinary urgency and constant burning for 7 days. Patient would like to drop off a urine sample.    Called pt LMOM to call back.

## 2022-07-28 NOTE — Telephone Encounter (Signed)
Pt just needed an appointment.  I have her schedule for 07/29/2022 at 4pm.  Pt advised.   Thanks,   -Vernona Rieger

## 2022-07-28 NOTE — Telephone Encounter (Signed)
Summary: Urinary burning/urgency   Patient states that she has had urinary urgency and constant burning for 7 days. Patient would like to drop off a urine sample.          Called pt - call went straight to voice mail. LMOM to call back. Forwarding encounter to office - no contact.

## 2022-07-28 NOTE — Telephone Encounter (Addendum)
Summary: Urinary burning/urgency   Patient states that she has had urinary urgency and constant burning for 7 days. Patient would like to drop off a urine sample.      Called pt - left message to call back.

## 2022-07-29 ENCOUNTER — Other Ambulatory Visit (HOSPITAL_COMMUNITY)
Admission: RE | Admit: 2022-07-29 | Discharge: 2022-07-29 | Disposition: A | Discharge status: Home / Self Care | Payer: BC Managed Care – PPO | Source: Ambulatory Visit | Attending: Internal Medicine | Admitting: Internal Medicine

## 2022-07-29 ENCOUNTER — Encounter: Payer: Self-pay | Admitting: Internal Medicine

## 2022-07-29 ENCOUNTER — Telehealth: Payer: Self-pay | Admitting: Podiatry

## 2022-07-29 ENCOUNTER — Ambulatory Visit: Payer: BC Managed Care – PPO | Admitting: Internal Medicine

## 2022-07-29 VITALS — BP 138/86 | HR 52 | Temp 97.3°F | Wt 237.0 lb

## 2022-07-29 DIAGNOSIS — R102 Pelvic and perineal pain: Secondary | ICD-10-CM | POA: Diagnosis not present

## 2022-07-29 DIAGNOSIS — R3915 Urgency of urination: Secondary | ICD-10-CM | POA: Diagnosis not present

## 2022-07-29 DIAGNOSIS — R300 Dysuria: Secondary | ICD-10-CM

## 2022-07-29 DIAGNOSIS — M5441 Lumbago with sciatica, right side: Secondary | ICD-10-CM

## 2022-07-29 DIAGNOSIS — G8929 Other chronic pain: Secondary | ICD-10-CM

## 2022-07-29 DIAGNOSIS — M5442 Lumbago with sciatica, left side: Secondary | ICD-10-CM

## 2022-07-29 LAB — POCT URINALYSIS DIPSTICK
Bilirubin, UA: NEGATIVE
Blood, UA: NEGATIVE
Glucose, UA: NEGATIVE
Ketones, UA: NEGATIVE
Leukocytes, UA: NEGATIVE
Nitrite, UA: NEGATIVE
Protein, UA: NEGATIVE
Spec Grav, UA: 1.015 (ref 1.010–1.025)
Urobilinogen, UA: 0.2 E.U./dL
pH, UA: 5 (ref 5.0–8.0)

## 2022-07-29 NOTE — Patient Instructions (Signed)

## 2022-07-29 NOTE — Telephone Encounter (Signed)
Pt called a little upset stating when she went back to work on 3.20.24 with her wearing the boot they said she had to have accommodations paperwork filled out. She stated they faxed it on 3.20.24 and it was due by today, I told her we never received it. They refaxed it today and we did get it. I did tell her Dr Allena Katz has been out of the office and is coming back next week. I also told pt that there was a 25.00 fee for filling it out and it has to be paid for before paperwork gets filled out.I told her she could stop in the office and pay the fee. She said ok.

## 2022-07-29 NOTE — Telephone Encounter (Signed)
I looked at notes and patient just needed her accommodation to wear the boot at work. Dr. Allena Katz had already recommended her wear the boot in his notes.Accommodation paperwork completed.

## 2022-07-29 NOTE — Telephone Encounter (Signed)
I have just received paperwork for Diana [name] today for accommodations at work, paperwork dated 07/29/2022. I was unaware of the patient nor the need prior to today, she never reached out to me, nor Dr. Allena Katz. I will complete the paperwork after I receive the [facility], from Dr. Allena Katz.

## 2022-07-29 NOTE — Progress Notes (Unsigned)
HPI  Pt presents to the clinic today with c/o urinary urgency and dysuria. This started 1 week ago.   Review of Systems  Past Medical History:  Diagnosis Date   Allergy    Ear infection    recurrent   Hypertension     Family History  Problem Relation Age of Onset   Hypertension Mother        alive   Arthritis Mother    Thyroid disease Mother    Lung cancer Father        deceased   Heart disease Father    Hypertension Maternal Grandmother     Social History   Socioeconomic History   Marital status: Widowed    Spouse name: Not on file   Number of children: 3   Years of education: Not on file   Highest education level: Not on file  Occupational History   Occupation: customer service    Employer: [employer]  Tobacco Use   Smoking status: Former    Types: Cigarettes    Quit date: 04/20/1985    Years since quitting: 37.2    Passive exposure: Past   Smokeless tobacco: Never   Tobacco comments:    remote tobacco use   Vaping Use   Vaping Use: Never used  Substance and Sexual Activity   Alcohol use: No    Alcohol/week: 0.0 standard drinks of alcohol   Drug use: No   Sexual activity: Not Currently  Other Topics Concern   Not on file  Social History Narrative   Not on file   Social Determinants of Health   Financial Resource Strain: Not on file  Food Insecurity: Not on file  Transportation Needs: Not on file  Physical Activity: Not on file  Stress: Not on file  Social Connections: Not on file  Intimate Partner Violence: Not on file    No Known Allergies   Constitutional: Denies fever, malaise, fatigue, headache or abrupt weight changes.   GU: Pt reports urgency, frequency and pain with urination. Denies burning sensation, blood in urine, odor or discharge. Skin: Denies redness, rashes, lesions or ulcercations.   No other specific complaints in a complete review of systems (except as listed in HPI above).    Objective:   Physical Exam  BP 138/86  (BP Location: Left Arm, Patient Position: Sitting, Cuff Size: Large)   Pulse (!) 52   Temp (!) 97.3 F (36.3 C) (Temporal)   Wt 237 lb (107.5 kg)   LMP 01/20/2016   SpO2 100%   BMI 37.12 kg/m   Wt Readings from Last 3 Encounters:  07/02/22 243 lb (110.2 kg)  06/26/22 247 lb (112 kg)  06/01/22 240 lb 6.4 oz (109 kg)    General: Appears her stated age, well developed, well nourished in NAD. Cardiovascular: Normal rate and rhythm. S1,S2 noted.   Pulmonary/Chest: Normal effort and positive vesicular breath sounds. No respiratory distress. No wheezes, rales or ronchi noted.  Abdomen: Soft. Normal bowel sounds. No distention or masses noted.  Tender to palpation over the bladder area. No CVA tenderness.        Assessment & Plan:   Urgency, Dysuria:  Urinalysis: normal Drink plenty of fluids  RTC in 2 months for follow-up of chronic conditions [name], NP

## 2022-07-31 LAB — URINE CYTOLOGY ANCILLARY ONLY
Bacterial Vaginitis-Urine: NEGATIVE
Candida Urine: NEGATIVE
Chlamydia: NEGATIVE
Comment: NEGATIVE
Comment: NEGATIVE
Comment: NORMAL
Neisseria Gonorrhea: NEGATIVE
Trichomonas: NEGATIVE

## 2022-08-04 ENCOUNTER — Ambulatory Visit: Payer: BC Managed Care – PPO | Admitting: Podiatry

## 2022-08-14 ENCOUNTER — Ambulatory Visit: Payer: Self-pay

## 2022-08-14 ENCOUNTER — Ambulatory Visit: Payer: BC Managed Care – PPO | Admitting: Physician Assistant

## 2022-08-14 ENCOUNTER — Encounter: Payer: Self-pay | Admitting: Physician Assistant

## 2022-08-14 VITALS — BP 156/92 | HR 93 | Wt 232.6 lb

## 2022-08-14 DIAGNOSIS — M5431 Sciatica, right side: Secondary | ICD-10-CM | POA: Diagnosis not present

## 2022-08-14 MED ORDER — PREDNISONE 20 MG PO TABS
ORAL_TABLET | ORAL | 0 refills | Status: DC
Start: 2022-08-14 — End: 2022-08-17

## 2022-08-14 MED ORDER — MELOXICAM 15 MG PO TABS
15.0000 mg | ORAL_TABLET | Freq: Every day | ORAL | 0 refills | Status: DC
Start: 2022-08-14 — End: 2022-08-17

## 2022-08-14 NOTE — Telephone Encounter (Signed)
  Chief Complaint: Back pain Symptoms: back pain unable to sit, rt foot numb Frequency: 2 days Pertinent Negatives: Patient denies  Disposition: [] ED /[] Urgent Care (no appt availability in office) / [x] Appointment(In office/virtual)/ []  [place] [facility]/ [] Home Care/ [] Refused Recommended Disposition /[] [place] [facility]/ []  Follow-up with PCP Additional Notes: Pt states that she has had back problems in the past. As of 2am pt has been in severe pain and cannot sit. Pain is in back and down right leg. Half of right foot is numb.      Summary: lower back and leg discomfort   The patient shares that they have experienced back and leg discomfort for a few days  The patient shares that the discomfort is primarily in their right buttock going down their leg  The patient has an ongoing history of back discomfort and would like to speak with clinical staff further  Please contact when possible     Reason for Disposition  Numbness in a leg or foot (i.e., loss of sensation)  Answer Assessment - Initial Assessment Questions 1. ONSET: "When did the pain begin?"      2 days 2. LOCATION: "Where does it hurt?" (upper, mid or lower back)     Rt buttock, rt leg 3. SEVERITY: "How bad is the pain?"  (e.g., Scale 1-10; mild, moderate, or severe)   - MILD (1-3): Doesn't interfere with normal activities.    - MODERATE (4-7): Interferes with normal activities or awakens from sleep.    - SEVERE (8-10): Excruciating pain, unable to do any normal activities.      Moderate to severe 4. PATTERN: "Is the pain constant?" (e.g., yes, no; constant, intermittent)       5. RADIATION: "Does the pain shoot into your legs or somewhere else?"     Down leg 6. CAUSE:  "What do you think is causing the back pain?"       7. BACK OVERUSE:  "Any recent lifting of heavy objects, strenuous work or exercise?"      8. MEDICINES: "What have you taken so far for the pain?" (e.g., nothing, acetaminophen,  NSAIDS)      9. NEUROLOGIC SYMPTOMS: "Do you have any weakness, numbness, or problems with bowel/bladder control?"     Numbness in rt foot 10. OTHER SYMPTOMS: "Do you have any other symptoms?" (e.g., fever, abdomen pain, burning with urination, blood in urine)  Protocols used: Back Pain-A-AH

## 2022-08-14 NOTE — Progress Notes (Signed)
Acute Office Visit   Patient: Diana [name]   DOB: [birth year]   62 y.o. Female  MRN: [id number] Visit Date: 08/14/2022  Today's healthcare provider: Oswaldo Conroy Myan Locatelli, PA-C  Introduced myself to the patient as a Secondary school teacher and provided education on APPs in clinical practice.    Chief Complaint  Patient presents with   Back Pain   Numbness   Sciatica    Patient says she having pain in the middle of her R buttock and says it is radiating down into back of her R leg. Patient says she has tried to use a heating pad and lay flat to help relieve any discomfort. Patient says when she got her the last 3 toes of her R foot were numb. Patient says she tried her muscle relaxers and it did not touch the pain. Patient says she cannot walk and rather walk around.    Subjective    HPI HPI     Sciatica    Additional comments: Patient says she having pain in the middle of her R buttock and says it is radiating down into back of her R leg. Patient says she has tried to use a heating pad and lay flat to help relieve any discomfort. Patient says when she got her the last 3 toes of her R foot were numb. Patient says she tried her muscle relaxers and it did not touch the pain. Patient says she cannot walk and rather walk around.       Last edited by Malen Gauze, CMA on 08/14/2022  3:39 PM.       Lower back pain   Onset: sudden with gradual progression  Duration: Started in middle of right buttocks a few days ago but this AM she started to have increased pain and radiation down her leg  Location: right lower back and right buttocks  Radiation: down back of right leg and into foot with numbness and tingling  She states her right 3-5th toes are numb  She reports increased pain with sitting  Pain level and character: 9/10 currently,  sharp and constant in nature  Does have flares with sitting or laying down and bending knees  Other associated symptoms:She denies bowel or bladder incontinence,  fevers, headaches, falls Interventions:heating pad, rest, muscle relaxers, Ibuprofen Alleviating: standing  Aggravating: sitting or bending legs   She reports having chronic back problems and the last episode like this was about 4.5 years ago   Medications: Outpatient Medications Prior to Visit  Medication Sig   Acetaminophen (TYLENOL ARTHRITIS PAIN PO) Take by mouth.   Ascorbic Acid 125 MG CHEW Chew 1 each by mouth daily.   atorvastatin (LIPITOR) 10 MG tablet Take 1 tablet (10 mg total) by mouth daily. (Patient taking differently: Take 5 mg by mouth daily.)   cetirizine (ZYRTEC) 10 MG tablet Take 10 mg by mouth daily as needed.   Cholecalciferol (VITAMIN D3) 25 MCG (1000 UT) CAPS Take 5,000 Units by mouth.    clonazePAM (KLONOPIN) 0.5 MG tablet Take 1 tablet (0.5 mg total) by mouth daily as needed. for anxiety   hydrocortisone (ANUSOL-HC) 2.5 % rectal cream Place 1 Application rectally 2 (two) times daily.   ibuprofen (ADVIL) 200 MG tablet Take 400-600 mg by mouth every 6 (six) hours as needed.   losartan-hydrochlorothiazide (HYZAAR) 50-12.5 MG tablet TAKE 1 TABLET BY MOUTH DAILY.   methocarbamol (ROBAXIN) 500 MG tablet TAKE 1 TABLET BY MOUTH EVERY 8 HOURS  AS NEEDED FOR MUSCLE SPASMS.   omeprazole (PRILOSEC) 40 MG capsule Take 1 capsule (40 mg total) by mouth 2 (two) times daily.   Turmeric (QC TUMERIC COMPLEX) 500 MG CAPS Take by mouth.   [DISCONTINUED] meloxicam (MOBIC) 15 MG tablet Take 1 tablet (15 mg total) by mouth daily.   No facility-administered medications prior to visit.    Review of Systems  Constitutional:  Negative for chills and fever.  Musculoskeletal:  Positive for back pain and myalgias.  Neurological:  Negative for dizziness and headaches.       Objective    BP (!) 156/92   Pulse 93   Wt 232 lb 9.6 oz (105.5 kg)   LMP 01/20/2016   SpO2 99%   BMI 36.43 kg/m    Physical Exam Vitals reviewed.  Constitutional:      General: She is awake.      Appearance: Normal appearance. She is well-developed and well-groomed.  HENT:     Head: Normocephalic and atraumatic.  Musculoskeletal:     Cervical back: Normal range of motion.     Thoracic back: Normal range of motion.     Lumbar back: Tenderness and bony tenderness present. Decreased range of motion. Positive right straight leg raise test.     Right hip: Tenderness present. Normal range of motion.     Right upper leg: Normal.     Left upper leg: Normal.     Right knee: Normal range of motion.       Legs:     Comments: Extension is normal with lumbar spine but forward flexion was declined due to pain  She reports tenderness along the lumbar midline with palpation but states this is normal for her  She is able to extend and flex her right leg at the hip but reports this causes discomfort, Abduction and adduction are intact but also cause pain per patient report She is able to bend right and left knee and is ambulating during the apt.   Skin:    Findings: Burn present.       Neurological:     General: No focal deficit present.     Mental Status: She is alert and oriented to person, place, and time.     GCS: GCS eye subscore is 4. GCS verbal subscore is 5. GCS motor subscore is 6.     Cranial Nerves: Cranial nerves 2-12 are intact.     Motor: Motor function is intact.     Gait: Gait is intact.  Psychiatric:        Attention and Perception: Attention and perception normal.        Mood and Affect: Mood normal.        Speech: Speech normal.        Behavior: Behavior normal. Behavior is cooperative.        Thought Content: Thought content normal.        Cognition and Memory: Cognition normal.       No results found for any visits on 08/14/22.  Assessment & Plan      No follow-ups on file.      Problem List Items Addressed This Visit   None Visit Diagnoses     Sciatica of right side    -  Primary Acute, recurrent problem per patient. Symptoms and physical exam are  consistent with sciatica of the right side but cannot rule out potential SI joint dysfunction or progression of previous lumbar spine issues. We discussed disease course  as well as potential causes of sciatica and SI joint dysfunction along with projected recovery.. At this time I recommend conservative management to include prednisone 7-day taper, meloxicam 15 mg p.o. daily, Tylenol, gentle stretches included in AVS, gentle massage, warm compresses Recommend only using warm compresses for 20 minutes at a time as she has developed a small blister along right leg likely from prolonged exposure  Reviewed that Meloxicam should not be used in conjunction with other PRN NSAIDs to avoid injury or side effects She reports lack of improvement or desired effects with Methocarbamol use so recommend dc this if desired.  Reviewed that if symptoms are not improving in 2-4 weeks with the above measures she will need to be re-evaluated and will likely need PT referral We discussed recovery course - reviewed that this can be different for every individual and will depend on inflammation and response to management plan Follow up as needed for persistent or progressing symptoms      Relevant Medications   predniSONE (DELTASONE) 20 MG tablet   meloxicam (MOBIC) 15 MG tablet        No follow-ups on file.   I, Shaterra Sanzone E Enoc Getter, PA-C, have reviewed all documentation for this visit. The documentation on 08/14/22 for the exam, diagnosis, procedures, and orders are all accurate and complete.   Jacquelin Hawking, MHS, PA-C [facility]

## 2022-08-14 NOTE — Patient Instructions (Addendum)
Based on your symptoms and physical exam I believe the following is the cause of your concern today SI joint dysfunction and sciatica along the right side  I recommend the following at this time to help relieve that discomfort:  Rest Warm compresses to the area (20 minutes on, minimum of 30 minutes off) You can alternate Tylenol and Ibuprofen for pain management but Ibuprofen is typically preferred to reduce inflammation.  I have sent in a refill of your Meloxicam 15 mg to be taken once per day with food and plenty of water. If you use the meloxicam you should not use other NSAIDs   (Ibuprofen, Advil, Aleve, etc) I have sent in a Prednisone taper to further help with the inflammation in the muscles of your lower back and buttocks. Please follow the instructions included in the taper pack.  You can use Lidocaine patches over the area and Voltaren gel to help with the pain  Gentle stretches and exercises that I have included in your paperwork Try to reduce excess strain to the area and rest as much as possible  Wear supportive shoes and, if you must lift anything, use proper lifting techniques that spare your back.   If these measures do not lead to improvement in your symptoms over the next 2-4 weeks please let Diana [name] know

## 2022-08-17 ENCOUNTER — Ambulatory Visit: Payer: BC Managed Care – PPO | Admitting: Internal Medicine

## 2022-08-17 ENCOUNTER — Encounter: Payer: Self-pay | Admitting: Internal Medicine

## 2022-08-17 VITALS — BP 150/84 | HR 81 | Temp 96.4°F | Wt 231.0 lb

## 2022-08-17 DIAGNOSIS — M5441 Lumbago with sciatica, right side: Secondary | ICD-10-CM | POA: Diagnosis not present

## 2022-08-17 DIAGNOSIS — Z0289 Encounter for other administrative examinations: Secondary | ICD-10-CM

## 2022-08-17 DIAGNOSIS — G8929 Other chronic pain: Secondary | ICD-10-CM | POA: Diagnosis not present

## 2022-08-17 MED ORDER — IBUPROFEN 600 MG PO TABS: 600.0000 mg | ORAL_TABLET | Freq: Three times a day (TID) | ORAL | 0 refills | Status: DC | PRN

## 2022-08-17 MED ORDER — TRAMADOL HCL 50 MG PO TABS: 50.0000 mg | ORAL_TABLET | Freq: Three times a day (TID) | ORAL | 0 refills | Status: AC | PRN

## 2022-08-17 MED ORDER — METHOCARBAMOL 500 MG PO TABS: 500.0000 mg | ORAL_TABLET | Freq: Three times a day (TID) | ORAL | 0 refills | Status: DC | PRN

## 2022-08-17 NOTE — Progress Notes (Signed)
Subjective:    Patient ID: Diana [name], female    DOB: 15-Apr-1961, 62 y.o.   MRN: [id number]  HPI  Patient presents to clinic today with complaint of right side  low back pain.  This is a chronic issue for her that flares intermittently.  She describes the pain as sharp. The pain radiates down into her right thigh. She reports associated numbness in the right leg but denies tingling or weakness. She denies loss of bowel or bladder control. She was seen 4/26 for the same, diagnosed with sciatica.  She was treated with a Prednisone taper and advised her to take her Methocarbamol as previously prescribed but reports it has not helped. Xray lumbar spine from 03/2018 reviewed:  IMPRESSION: Mild degenerative change without acute abnormality.  She has done PT in the past and reports it always seems to make her symptoms worse. She has tried stretching at home with the same results. She would also like FMLA form completion.  Review of Systems  Past Medical History:  Diagnosis Date   Allergy    Ear infection    recurrent   Hypertension     Current Outpatient Medications  Medication Sig Dispense Refill   Acetaminophen (TYLENOL ARTHRITIS PAIN PO) Take by mouth.     Ascorbic Acid 125 MG CHEW Chew 1 each by mouth daily.     atorvastatin (LIPITOR) 10 MG tablet Take 1 tablet (10 mg total) by mouth daily. (Patient taking differently: Take 5 mg by mouth daily.) 90 tablet 1   cetirizine (ZYRTEC) 10 MG tablet Take 10 mg by mouth daily as needed.     Cholecalciferol (VITAMIN D3) 25 MCG (1000 UT) CAPS Take 5,000 Units by mouth.      clonazePAM (KLONOPIN) 0.5 MG tablet Take 1 tablet (0.5 mg total) by mouth daily as needed. for anxiety 30 tablet 0   hydrocortisone (ANUSOL-HC) 2.5 % rectal cream Place 1 Application rectally 2 (two) times daily. 30 g 0   ibuprofen (ADVIL) 200 MG tablet Take 400-600 mg by mouth every 6 (six) hours as needed.     losartan-hydrochlorothiazide (HYZAAR) 50-12.5 MG tablet TAKE  1 TABLET BY MOUTH DAILY. 90 tablet 1   meloxicam (MOBIC) 15 MG tablet Take 1 tablet (15 mg total) by mouth daily. 30 tablet 0   methocarbamol (ROBAXIN) 500 MG tablet TAKE 1 TABLET BY MOUTH EVERY 8 HOURS AS NEEDED FOR MUSCLE SPASMS. 30 tablet 0   omeprazole (PRILOSEC) 40 MG capsule Take 1 capsule (40 mg total) by mouth 2 (two) times daily. 180 capsule 1   predniSONE (DELTASONE) 20 MG tablet Take 60mg  PO daily x 2 days, then40mg  PO daily x 2 days, then 20mg  PO daily x 3 days 13 tablet 0   Turmeric (QC TUMERIC COMPLEX) 500 MG CAPS Take by mouth.     No current facility-administered medications for this visit.    No Known Allergies  Family History  Problem Relation Age of Onset   Hypertension Mother        alive   Arthritis Mother    Thyroid disease Mother    Lung cancer Father        deceased   Heart disease Father    Hypertension Maternal Grandmother     Social History   Socioeconomic History   Marital status: Widowed    Spouse name: Not on file   Number of children: 3   Years of education: Not on file   Highest education level: Not on file  Occupational History   Occupation: Nutritional therapist: [employer]  Tobacco Use   Smoking status: Former    Types: Cigarettes    Quit date: 04/20/1985    Years since quitting: 37.3    Passive exposure: Past   Smokeless tobacco: Never   Tobacco comments:    remote tobacco use   Vaping Use   Vaping Use: Never used  Substance and Sexual Activity   Alcohol use: No    Alcohol/week: 0.0 standard drinks of alcohol   Drug use: No   Sexual activity: Not Currently  Other Topics Concern   Not on file  Social History Narrative   Not on file   Social Determinants of Health   Financial Resource Strain: Not on file  Food Insecurity: Not on file  Transportation Needs: Not on file  Physical Activity: Not on file  Stress: Not on file  Social Connections: Not on file  Intimate Partner Violence: Not on file      Constitutional: Denies fever, malaise, fatigue, headache or abrupt weight changes.  Respiratory: Denies difficulty breathing, shortness of breath, cough or sputum production.   Cardiovascular: Denies chest pain, chest tightness, palpitations or swelling in the hands or feet.  Gastrointestinal: Denies loss of bowel control, abdominal pain, bloating, constipation, diarrhea or blood in the stool.  GU: Denies loss of bowel control, urgency, frequency, pain with urination, burning sensation, blood in urine, odor or discharge. Musculoskeletal: Patient reports low back pain, pain in right leg.  Denies decrease in range of motion, difficulty with gait, or joint swelling.  Skin: Denies redness, rashes, lesions or ulcercations.  Neurological: Pt reports numbness in right leg. Denies dizziness, difficulty with memory, difficulty with speech or problems with balance and coordination.  Psych: Denies anxiety, depression, SI/HI.  No other specific complaints in a complete review of systems (except as listed in HPI above).     Objective:   Physical Exam  BP (!) 150/84 (BP Location: Left Arm, Patient Position: Sitting, Cuff Size: Large)   Pulse 81   Temp (!) 96.4 F (35.8 C) (Temporal)   Wt 231 lb (104.8 kg)   LMP 01/20/2016   SpO2 99%   BMI 36.18 kg/m   Wt Readings from Last 3 Encounters:  08/14/22 232 lb 9.6 oz (105.5 kg)  07/29/22 237 lb (107.5 kg)  07/02/22 243 lb (110.2 kg)    General: Appears her stated age, obese, in NAD. Skin: Warm, dry and intact.  HEENT: Head: normal shape and size; Eyes: sclera white, no icterus, conjunctiva pink, PERRLA and EOMs intact;  Cardiovascular: Normal rate and rhythm. S1,S2 noted.  No murmur, rubs or gallops noted.  Pulmonary/Chest: Normal effort and positive vesicular breath sounds. No respiratory distress. No wheezes, rales or ronchi noted.  Musculoskeletal: Decreased flexion of the spine. Normal extension, rotation and lateral bending. Pain with  palpation generally over the lumbar spine and right paralumbar muscles, SI joint. Strength 5/5 BLE. Able to stand on tip toes and heels. No difficulty with gait.  Neurological: Alert and oriented. Positive SLR on the right at 30 degrees.  Coordination normal.     BMET    Component Value Date/Time   NA 141 06/26/2022 1539   K 3.4 (L) 06/26/2022 1539   CL 99 06/26/2022 1539   CO2 31 06/26/2022 1539   GLUCOSE 120 (H) 06/26/2022 1539   BUN 9 06/26/2022 1539   CREATININE 0.71 06/26/2022 1539   CALCIUM 9.2 06/26/2022 1539   GFRNONAA >60  07/02/2021 2000   GFRAA >60 07/27/2017 2153    Lipid Panel     Component Value Date/Time   CHOL 144 06/26/2022 1539   CHOL 189 07/28/2021 1553   TRIG 158 (H) 06/26/2022 1539   TRIG 120 07/28/2021 1553   HDL 16 (L) 06/26/2022 1539   CHOLHDL 9.0 (H) 06/26/2022 1539   VLDL 21.0 02/09/2019 1245   LDLCALC 103 (H) 06/26/2022 1539    CBC    Component Value Date/Time   WBC 6.1 06/26/2022 1539   RBC 4.37 06/26/2022 1539   HGB 13.1 06/26/2022 1539   HCT 39.9 06/26/2022 1539   PLT 257 06/26/2022 1539   MCV 91.3 06/26/2022 1539   MCH 30.0 06/26/2022 1539   MCHC 32.8 06/26/2022 1539   RDW 12.9 06/26/2022 1539   LYMPHSABS 1.1 07/28/2016 1232   MONOABS 0.4 07/28/2016 1232   EOSABS 0.0 07/28/2016 1232   BASOSABS 0.0 07/28/2016 1232    Hgb A1C Lab Results  Component Value Date   HGBA1C 7.0 (H) 06/26/2022           Assessment & Plan:   Chronic Right Side Low Back Pain with Right Side Sciatica:  Xray lumbar spine reviewed She has failed conservative treatment including OTC, RX  meds, stretching and PT MRI lumbar spine ordered Methocarbamol refilled Stop Meloxicam, start Ibuprofen 600 mg Q8H prn, consume with food RX for Tramadol 50 mg TID prn Will fill out FMLA forms once she faxes them over  RTC in 2 months for follow-up of chronic conditions [name], NP

## 2022-08-17 NOTE — Patient Instructions (Signed)
Sciatica  Sciatica is pain, weakness, tingling, or loss of feeling (numbness) along the sciatic nerve. The sciatic nerve starts in the lower back and goes down the back of each leg. Sciatica usually affects one side of the body. Sciatica usually goes away on its own or with treatment. Sometimes, sciatica may come back. What are the causes? This condition happens when the sciatic nerve is pinched or has pressure put on it. This may be caused by: A disk in between the bones of the spine bulging out too far (herniated disk). Changes in the spinal disks due to aging. A condition that affects a muscle in the butt. Extra bone growth near the sciatic nerve. A break (fracture) of the area between your hip bones (pelvis). Pregnancy. Tumor. This is rare. What increases the risk? You are more likely to develop this condition if you: Play sports that put pressure or stress on the spine. Have poor strength and ease of movement (flexibility). Have had a back injury or back surgery. Sit for long periods of time. Do activities that involve bending or lifting over and over again. Are very overweight (obese). What are the signs or symptoms? Symptoms can vary from mild to very bad. They may include: Any of these problems in the lower back, leg, hip, or butt: Mild tingling, loss of feeling, or dull aches. A burning feeling. Sharp pains. Loss of feeling in the back of the calf or the sole of the foot. Leg weakness. Very bad back pain that makes it hard to move. These symptoms may get worse when you cough, sneeze, or laugh. They may also get worse when you sit or stand for long periods of time. How is this treated? This condition often gets better without any treatment. However, treatment may include: Changing or cutting back on physical activity when you have pain. Exercising, including strengthening and stretching. Putting ice or heat on the affected area. Shots of medicines to relieve pain and  swelling or to relax your muscles. Surgery. Follow these instructions at home: Medicines Take over-the-counter and prescription medicines only as told by your doctor. Ask your doctor if you should avoid driving or using machines while you are taking your medicine. Managing pain     If told, put ice on the affected area. To do this: Put ice in a plastic bag. Place a towel between your skin and the bag. Leave the ice on for 20 minutes, 2-3 times a day. If your skin turns bright red, take off the ice right away to prevent skin damage. The risk of skin damage is higher if you cannot feel pain, heat, or cold. If told, put heat on the affected area. Do this as often as told by your doctor. Use the heat source that your doctor tells you to use, such as a moist heat pack or a heating pad. Place a towel between your skin and the heat source. Leave the heat on for 20-30 minutes. If your skin turns bright red, take off the heat right away to prevent burns. The risk of burns is higher if you cannot feel pain, heat, or cold. Activity  Return to your normal activities when your doctor says that it is safe. Avoid activities that make your symptoms worse. Take short rests during the day. When you rest for a long time, do some physical activity or stretching between periods of rest. Avoid sitting for a long time without moving. Get up and move around at least one time each   hour. Do exercises and stretches as told by your doctor. Do not lift anything that is heavier than 10 lb (4.5 kg). Avoid lifting heavy things even when you do not have symptoms. Avoid lifting heavy things over and over. When you lift objects, always lift in a way that is safe for your body. To do this, you should: Bend your knees. Keep the object close to your body. Avoid twisting. General instructions Stay at a healthy weight. Wear comfortable shoes that support your feet. Avoid wearing high heels. Avoid sleeping on a mattress  that is too soft or too hard. You might have less pain if you sleep on a mattress that is firm enough to support your back. Contact a doctor if: Your pain is not controlled by medicine. Your pain does not get better. Your pain gets worse. Your pain lasts longer than 4 weeks. You lose weight without trying. Get help right away if: You cannot control when you pee (urinate) or poop (have a bowel movement). You have weakness in any of these areas and it gets worse: Lower back. The area between your hip bones. Butt. Legs. You have redness or swelling of your back. You have a burning feeling when you pee. Summary Sciatica is pain, weakness, tingling, or loss of feeling (numbness) along the sciatic nerve. This may include the lower back, legs, hips, and butt. This condition happens when the sciatic nerve is pinched or has pressure put on it. Treatment often includes rest, exercise, medicines, and putting ice or heat on the affected area. This information is not intended to replace advice given to you by your health care provider. Make sure you discuss any questions you have with your health care provider. Document Revised: 07/14/2021 Document Reviewed: 07/14/2021 Elsevier Patient Education  2023 [employer]  

## 2022-08-22 ENCOUNTER — Ambulatory Visit
Admission: RE | Admit: 2022-08-22 | Discharge: 2022-08-22 | Disposition: A | Discharge status: Home / Self Care | Payer: BC Managed Care – PPO | Source: Ambulatory Visit | Attending: Internal Medicine | Admitting: Internal Medicine

## 2022-08-22 DIAGNOSIS — G8929 Other chronic pain: Secondary | ICD-10-CM

## 2022-08-22 DIAGNOSIS — R200 Anesthesia of skin: Secondary | ICD-10-CM | POA: Diagnosis not present

## 2022-08-22 DIAGNOSIS — M5416 Radiculopathy, lumbar region: Secondary | ICD-10-CM | POA: Diagnosis not present

## 2022-08-24 ENCOUNTER — Ambulatory Visit: Payer: BC Managed Care – PPO

## 2022-08-25 ENCOUNTER — Telehealth: Payer: Self-pay

## 2022-08-25 NOTE — Telephone Encounter (Signed)
Copied from CRM [id number]. Topic: General - Other >> Aug 25, 2022  1:13 PM Macon Large wrote: Reason for CRM: Pt called for MRI results. Pt requests call back. Cb# [phone]

## 2022-08-25 NOTE — Telephone Encounter (Signed)
Pt advised.   Thanks,   -Kristyne Woodring  

## 2022-08-25 NOTE — Telephone Encounter (Signed)
The radiologist has not yet read the MRI.  She just had this done today.  I will release the results to her MyChart once the results come through to me

## 2022-08-26 ENCOUNTER — Ambulatory Visit: Payer: BC Managed Care – PPO | Admitting: Internal Medicine

## 2022-08-26 ENCOUNTER — Other Ambulatory Visit (HOSPITAL_COMMUNITY)
Admission: RE | Admit: 2022-08-26 | Discharge: 2022-08-26 | Disposition: A | Discharge status: Home / Self Care | Payer: BC Managed Care – PPO | Source: Ambulatory Visit | Attending: Internal Medicine | Admitting: Internal Medicine

## 2022-08-26 ENCOUNTER — Encounter: Payer: Self-pay | Admitting: Internal Medicine

## 2022-08-26 VITALS — BP 136/82 | HR 85 | Temp 96.7°F | Wt 227.0 lb

## 2022-08-26 DIAGNOSIS — N949 Unspecified condition associated with female genital organs and menstrual cycle: Secondary | ICD-10-CM | POA: Diagnosis not present

## 2022-08-26 DIAGNOSIS — R300 Dysuria: Secondary | ICD-10-CM

## 2022-08-26 NOTE — Progress Notes (Signed)
Subjective:    Patient ID: Diana [name], female    DOB: [birth year], 62 y.o.   MRN: [id number]  HPI  Patient presents to clinic today with complaint of vaginal burning. This started 2-3 days. She denies vaginal discharge, odor, pelvic pain or abnormal vaginal bleeding. She does have some dysuria, but denies urgency, frequency or blood in her urine.  She would like STD testing. She is sexually active.  Review of Systems     Past Medical History:  Diagnosis Date   Allergy    Ear infection    recurrent   Hypertension     Current Outpatient Medications  Medication Sig Dispense Refill   Acetaminophen (TYLENOL ARTHRITIS PAIN PO) Take by mouth.     Ascorbic Acid 125 MG CHEW Chew 1 each by mouth daily.     atorvastatin (LIPITOR) 10 MG tablet Take 1 tablet (10 mg total) by mouth daily. (Patient taking differently: Take 5 mg by mouth daily.) 90 tablet 1   cetirizine (ZYRTEC) 10 MG tablet Take 10 mg by mouth daily as needed.     Cholecalciferol (VITAMIN D3) 25 MCG (1000 UT) CAPS Take 5,000 Units by mouth.      clonazePAM (KLONOPIN) 0.5 MG tablet Take 1 tablet (0.5 mg total) by mouth daily as needed. for anxiety 30 tablet 0   hydrocortisone (ANUSOL-HC) 2.5 % rectal cream Place 1 Application rectally 2 (two) times daily. 30 g 0   ibuprofen (ADVIL) 600 MG tablet Take 1 tablet (600 mg total) by mouth every 8 (eight) hours as needed. 30 tablet 0   losartan-hydrochlorothiazide (HYZAAR) 50-12.5 MG tablet TAKE 1 TABLET BY MOUTH DAILY. 90 tablet 1   methocarbamol (ROBAXIN) 500 MG tablet Take 1 tablet (500 mg total) by mouth every 8 (eight) hours as needed for muscle spasms. 30 tablet 0   omeprazole (PRILOSEC) 40 MG capsule Take 1 capsule (40 mg total) by mouth 2 (two) times daily. 180 capsule 1   Turmeric (QC TUMERIC COMPLEX) 500 MG CAPS Take by mouth.     No current facility-administered medications for this visit.    No Known Allergies  Family History  Problem Relation Age of Onset    Hypertension Mother        alive   Arthritis Mother    Thyroid disease Mother    Lung cancer Father        deceased   Heart disease Father    Hypertension Maternal Grandmother     Social History   Socioeconomic History   Marital status: Widowed    Spouse name: Not on file   Number of children: 3   Years of education: Not on file   Highest education level: Not on file  Occupational History   Occupation: customer service    Employer: [employer]  Tobacco Use   Smoking status: Former    Types: Cigarettes    Quit date: 04/20/1985    Years since quitting: 37.3    Passive exposure: Past   Smokeless tobacco: Never   Tobacco comments:    remote tobacco use   Vaping Use   Vaping Use: Never used  Substance and Sexual Activity   Alcohol use: No    Alcohol/week: 0.0 standard drinks of alcohol   Drug use: No   Sexual activity: Not Currently  Other Topics Concern   Not on file  Social History Narrative   Not on file   Social Determinants of Health   Financial Resource Strain: Not on  file  Food Insecurity: Not on file  Transportation Needs: Not on file  Physical Activity: Not on file  Stress: Not on file  Social Connections: Not on file  Intimate Partner Violence: Not on file     Constitutional: Denies fever, malaise, fatigue, headache or abrupt weight changes.  Respiratory: Denies difficulty breathing, shortness of breath, cough or sputum production.   Cardiovascular: Denies chest pain, chest tightness, palpitations or swelling in the hands or feet.  Gastrointestinal: Denies abdominal pain, bloating, constipation, diarrhea or blood in the stool.  GU: Pt reports vaginal burning and dysuria. Denies urgency, frequency, burning sensation, blood in urine, odor or discharge. Skin: Denies redness, rashes, lesions or ulcercations.   No other specific complaints in a complete review of systems (except as listed in HPI above).  Objective:   Physical Exam   BP 136/82 (BP  Location: Left Arm, Patient Position: Sitting, Cuff Size: Large)   Pulse 85   Temp (!) 96.7 F (35.9 C) (Temporal)   Wt 227 lb (103 kg)   LMP 01/20/2016   SpO2 99%   BMI 35.55 kg/m   Wt Readings from Last 3 Encounters:  08/17/22 231 lb (104.8 kg)  08/14/22 232 lb 9.6 oz (105.5 kg)  07/29/22 237 lb (107.5 kg)    General: Appears her stated age, obese, in NAD. Cardiovascular: Normal rate and rhythm.  Pulmonary/Chest: Normal effort and positive vesicular breath sounds. No respiratory distress. No wheezes, rales or ronchi noted.  Pelvic: Self swabbed. Neurological: Alert and oriented.    BMET    Component Value Date/Time   NA 141 06/26/2022 1539   K 3.4 (L) 06/26/2022 1539   CL 99 06/26/2022 1539   CO2 31 06/26/2022 1539   GLUCOSE 120 (H) 06/26/2022 1539   BUN 9 06/26/2022 1539   CREATININE 0.71 06/26/2022 1539   CALCIUM 9.2 06/26/2022 1539   GFRNONAA >60 07/02/2021 2000   GFRAA >60 07/27/2017 2153    Lipid Panel     Component Value Date/Time   CHOL 144 06/26/2022 1539   CHOL 189 07/28/2021 1553   TRIG 158 (H) 06/26/2022 1539   TRIG 120 07/28/2021 1553   HDL 16 (L) 06/26/2022 1539   CHOLHDL 9.0 (H) 06/26/2022 1539   VLDL 21.0 02/09/2019 1245   LDLCALC 103 (H) 06/26/2022 1539    CBC    Component Value Date/Time   WBC 6.1 06/26/2022 1539   RBC 4.37 06/26/2022 1539   HGB 13.1 06/26/2022 1539   HCT 39.9 06/26/2022 1539   PLT 257 06/26/2022 1539   MCV 91.3 06/26/2022 1539   MCH 30.0 06/26/2022 1539   MCHC 32.8 06/26/2022 1539   RDW 12.9 06/26/2022 1539   LYMPHSABS 1.1 07/28/2016 1232   MONOABS 0.4 07/28/2016 1232   EOSABS 0.0 07/28/2016 1232   BASOSABS 0.0 07/28/2016 1232    Hgb A1C Lab Results  Component Value Date   HGBA1C 7.0 (H) 06/26/2022           Assessment & Plan:   Vaginal Burning, Dysuria:  Will obtain wet prep to check for gonorrhea, chlamydia, trichomonas, BV and yeast Will treat according to results once they are reviewed  RTC  in 1 month for follow-up of chronic conditions [name], NP

## 2022-08-28 ENCOUNTER — Telehealth: Payer: Self-pay | Admitting: Internal Medicine

## 2022-08-28 DIAGNOSIS — G8929 Other chronic pain: Secondary | ICD-10-CM

## 2022-08-28 LAB — CERVICOVAGINAL ANCILLARY ONLY
Bacterial Vaginitis (gardnerella): POSITIVE — AB
Candida Glabrata: NEGATIVE
Candida Vaginitis: NEGATIVE
Chlamydia: NEGATIVE
Comment: NEGATIVE
Comment: NEGATIVE
Comment: NEGATIVE
Comment: NEGATIVE
Comment: NEGATIVE
Comment: NORMAL
Neisseria Gonorrhea: NEGATIVE
Trichomonas: NEGATIVE

## 2022-08-28 NOTE — Telephone Encounter (Signed)
Pt is calling to report that she is in agreement for referral.  Pt has not also seen the results on the UA Please advise CB- [phone]

## 2022-08-31 MED ORDER — METRONIDAZOLE 500 MG PO TABS: 500.0000 mg | ORAL_TABLET | Freq: Two times a day (BID) | ORAL | 0 refills | Status: DC

## 2022-08-31 NOTE — Telephone Encounter (Signed)
Referral placed.  I will review urinalysis and culture results when I review labs.

## 2022-08-31 NOTE — Addendum Note (Signed)
Addended by: [name] on: 08/31/2022 10:32 AM   Modules accepted: Orders

## 2022-08-31 NOTE — Addendum Note (Signed)
Addended by: [name] on: 08/31/2022 10:04 AM   Modules accepted: Orders

## 2022-09-02 ENCOUNTER — Other Ambulatory Visit: Payer: Self-pay | Admitting: Internal Medicine

## 2022-09-02 ENCOUNTER — Ambulatory Visit: Payer: BC Managed Care – PPO | Admitting: Podiatry

## 2022-09-02 NOTE — Telephone Encounter (Signed)
Refused potassium chloride 10 Meq because it was discontinued 10/27/2021.   Not on medication list.

## 2022-09-03 ENCOUNTER — Ambulatory Visit: Payer: Self-pay | Admitting: *Deleted

## 2022-09-03 ENCOUNTER — Other Ambulatory Visit: Payer: Self-pay | Admitting: Internal Medicine

## 2022-09-03 ENCOUNTER — Encounter: Payer: Self-pay | Admitting: Internal Medicine

## 2022-09-03 DIAGNOSIS — N760 Acute vaginitis: Secondary | ICD-10-CM

## 2022-09-03 NOTE — Addendum Note (Signed)
Addended by: Kavin Leech E on: 09/03/2022 11:14 AM   Modules accepted: Orders

## 2022-09-03 NOTE — Telephone Encounter (Signed)
Referral placed.

## 2022-09-03 NOTE — Telephone Encounter (Signed)
At this point, I think she should follow up with GYN for recurrent BV. Does she need a referral.

## 2022-09-03 NOTE — Telephone Encounter (Signed)
Medication Refill - Medication: potassium chloride (KLOR-CON) 10 MEQ tablet  Denied says refill not appropriate but pt needs the med  Has the patient contacted their pharmacy? yes (Agent: If no, request that the patient contact the pharmacy for the refill. If patient does not wish to contact the pharmacy document the reason why and proceed with request.) (Agent: If yes, when and what did the pharmacy advise?)contact pcp  Preferred Pharmacy (with phone number or street name):  [facility] - [place], [place] - [address] Phone: [phone]  Fax: [phone]     Has the patient been seen for an appointment in the last year OR does the patient have an upcoming appointment? yes  Agent: Please be advised that RX refills may take up to 3 business days. We ask that you follow-up with your pharmacy.

## 2022-09-03 NOTE — Telephone Encounter (Signed)
Summary: med ?   Pt had an apt on may 8 for BV  and was given medicine but says its not working as well this time and she still has burning, wants to know why.            Chief Complaint: Vaginal Burning Symptoms: Still with vaginal burning Frequency: 08/26/22 Pertinent Negatives: Patient denies  Disposition: [] ED /[] Urgent Care (no appt availability in office) / [] Appointment(In office/virtual)/ []  [facility]/ [] Home Care/ [] Refused Recommended Disposition /[] [facility]/ [x]  Follow-up with PCP Additional Notes:  Pt concerned Metronidazole "Not working as well as last time." States started med Saturday. Concerned will not be resolved by weekend "I'll be out of med and it will be the weekend." States "When I had this before burning stopped after 2 days on med." States has lessened but not resolved. Assured pt NT will route to practice for PCPs review.  Reason for Disposition  [1] Caller has NON-URGENT medicine question about med that PCP prescribed AND [2] triager unable to answer question  Answer Assessment - Initial Assessment Questions 1. NAME of MEDICINE: "What medicine(s) are you calling about?"     Metronidazole 2. QUESTION: "What is your question?" (e.g., double dose of medicine, side effect)     Not working as well 3. PRESCRIBER: "Who prescribed the medicine?" Reason: if prescribed by specialist, call should be referred to that group.     PCP 4. SYMPTOMS: "Do you have any symptoms?" If Yes, ask: "What symptoms are you having?"  "How bad are the symptoms (e.g., mild, moderate, severe)     Burning  Protocols used: Medication Question Call-A-AH

## 2022-09-03 NOTE — Telephone Encounter (Signed)
Requested Prescriptions  Pending Prescriptions Disp Refills   potassium chloride (KLOR-CON) 10 MEQ tablet 90 tablet 0    Sig: Take 1 tablet (10 mEq total) by mouth daily.     Endocrinology:  Minerals - Potassium Supplementation Failed - 09/03/2022  9:32 AM      Failed - K in normal range and within 360 days    Potassium  Date Value Ref Range Status  06/26/2022 3.4 (L) 3.5 - 5.3 mmol/L Final         Passed - Cr in normal range and within 360 days    Creat  Date Value Ref Range Status  06/26/2022 0.71 0.50 - 1.03 mg/dL Final         Passed - Valid encounter within last 12 months    Recent Outpatient Visits           1 week ago Vaginal burning   [place] [facility] [place], [place] W, NP   2 weeks ago Chronic right-sided low back pain with right-sided sciatica   [place] [facility] [place], Salvadore Oxford, NP   2 weeks ago Sciatica of right side   [place] [facility] [place], Oswaldo Conroy, PA-C   1 month ago Urinary urgency   [place] [facility] [place], [place] W, NP   2 months ago Type 2 diabetes mellitus without complication, without long-term current use of insulin [facility])   [place] [facility] [place], Salvadore Oxford, NP       Future Appointments             In 3 weeks [place], Salvadore Oxford, NP [place] [facility], [facility]   In 4 months [place], Salvadore Oxford, NP [facility], [facility]

## 2022-09-03 NOTE — Telephone Encounter (Signed)
Pt advised.  She says she will need a referral.   Thanks,   -Vernona Rieger

## 2022-09-03 NOTE — Addendum Note (Signed)
Addended by: [name] on: 09/03/2022 11:18 AM   Modules accepted: Orders

## 2022-09-11 ENCOUNTER — Other Ambulatory Visit: Payer: Self-pay | Admitting: Internal Medicine

## 2022-09-11 MED ORDER — METHOCARBAMOL 500 MG PO TABS: 500.0000 mg | ORAL_TABLET | Freq: Three times a day (TID) | ORAL | 0 refills | Status: DC | PRN

## 2022-09-11 NOTE — Progress Notes (Unsigned)
Referring Physician:  [name], NP [address] [place],  [place] [address]  Primary Physician:  [name], NP  History of Present Illness: 09/16/2022 [name] has a history of HTN, GERD, chronic hep B, hyperlipidemia, DM, and prediabetes.   History of chronic constant LBP with right leg pain with flare ups. Last one was about a month ago and she has since improved. LBP is no longer severe and right leg pain is gone. She has numbness in right lateral foot that is improving. No weakness. No left leg pain. Pain is worse with sitting on hard surfaces. She does okay standing and walking.   She is taking motrin prn and robaxin prn.   Bowel/Bladder Dysfunction: none  Conservative measures:  Physical therapy: did 4 years ago and made her worse Multimodal medical therapy including regular antiinflammatories: prednisone taper, robaxin, mobic, tramadol  Injections:  Had 9-10 years ago with no relief   Past Surgery: No spinal surgery  Diana [name] has no symptoms of cervical myelopathy.  The symptoms are causing a significant impact on the patient's life.   Review of Systems:  A 10 point review of systems is negative, except for the pertinent positives and negatives detailed in the HPI.  Past Medical History: Past Medical History:  Diagnosis Date   Allergy    Ear infection    recurrent   Hypertension     Past Surgical History: Past Surgical History:  Procedure Laterality Date   ABDOMINAL HYSTERECTOMY  2019   partial   COLONOSCOPY WITH PROPOFOL N/A 11/10/2021   Procedure: COLONOSCOPY WITH PROPOFOL;  Surgeon: Toney Reil, MD;  Location: [facility] ENDOSCOPY;  Service: Gastroenterology;  Laterality: N/A;   TONSILLECTOMY     TUBAL LIGATION     bilateral    Allergies: Allergies as of 09/16/2022   (No Known Allergies)    Medications: Outpatient Encounter Medications as of 09/16/2022  Medication Sig   gabapentin (NEURONTIN) 300 MG capsule Take 1 capsule  (300 mg total) by mouth at bedtime.   Acetaminophen (TYLENOL ARTHRITIS PAIN PO) Take by mouth.   Ascorbic Acid 125 MG CHEW Chew 1 each by mouth daily.   atorvastatin (LIPITOR) 10 MG tablet Take 1 tablet (10 mg total) by mouth daily. (Patient taking differently: Take 5 mg by mouth daily.)   cetirizine (ZYRTEC) 10 MG tablet Take 10 mg by mouth daily as needed.   Cholecalciferol (VITAMIN D3) 25 MCG (1000 UT) CAPS Take 5,000 Units by mouth.    clonazePAM (KLONOPIN) 0.5 MG tablet Take 1 tablet (0.5 mg total) by mouth daily as needed. for anxiety   hydrocortisone (ANUSOL-HC) 2.5 % rectal cream Place 1 Application rectally 2 (two) times daily.   ibuprofen (ADVIL) 600 MG tablet Take 1 tablet (600 mg total) by mouth every 8 (eight) hours as needed.   losartan-hydrochlorothiazide (HYZAAR) 50-12.5 MG tablet TAKE 1 TABLET BY MOUTH DAILY.   methocarbamol (ROBAXIN) 500 MG tablet Take 1 tablet (500 mg total) by mouth every 8 (eight) hours as needed for muscle spasms.   metroNIDAZOLE (FLAGYL) 500 MG tablet Take 1 tablet (500 mg total) by mouth 2 (two) times daily. Do not drink alcohol while taking this medicine.   omeprazole (PRILOSEC) 40 MG capsule Take 1 capsule (40 mg total) by mouth 2 (two) times daily.   Turmeric (QC TUMERIC COMPLEX) 500 MG CAPS Take by mouth.   No facility-administered encounter medications on file as of 09/16/2022.    Social History: Social  History   Tobacco Use   Smoking status: Former    Types: Cigarettes    Quit date: 04/20/1985    Years since quitting: 37.4    Passive exposure: Past   Smokeless tobacco: Never   Tobacco comments:    remote tobacco use   Vaping Use   Vaping Use: Never used  Substance Use Topics   Alcohol use: No    Alcohol/week: 0.0 standard drinks of alcohol   Drug use: No    Family Medical History: Family History  Problem Relation Age of Onset   Hypertension Mother        alive   Arthritis Mother    Thyroid disease Mother    Lung cancer Father         deceased   Heart disease Father    Hypertension Maternal Grandmother     Physical Examination: Vitals:   09/16/22 0854  BP: 130/78    General: Patient is well developed, well nourished, calm, collected, and in no apparent distress. Attention to examination is appropriate.  Respiratory: Patient is breathing without any difficulty.   NEUROLOGICAL:     Awake, alert, oriented to person, place, and time.  Speech is clear and fluent. Fund of knowledge is appropriate.   Cranial Nerves: Pupils equal round and reactive to light.  Facial tone is symmetric.    Limited ROM of lumbar spine with pain Diffuse lower posterior lumbar tenderness.   No abnormal lesions on exposed skin.   Strength: Side Biceps Triceps Deltoid Interossei Grip Wrist Ext. Wrist Flex.  R 5 5 5 5 5 5 5   L 5 5 5 5 5 5 5    Side Iliopsoas Quads Hamstring PF DF EHL  R 5 5 5 5 5 5   L 5 5 5 5 5 5    Reflexes are 2+ and symmetric at the biceps, triceps, brachioradialis, patella and achilles.   Hoffman's is absent.  Clonus is not present.   Bilateral upper and lower extremity sensation is intact to light touch.     Gait is normal.    Medical Decision Making  Imaging: Lumbar MRI dated 08/22/22:  FINDINGS: Segmentation:  5 lumbar type vertebral bodies.   Alignment:  No listhesis.   Vertebrae: No acute fracture, evidence of discitis, or suspicious osseous lesion.   Conus medullaris and cauda equina: Conus extends to the L1 level. Conus and cauda equina appear normal.   Paraspinal and other soft tissues: Atrophy of the inferior paraspinous muscles. No lymphadenopathy.   Disc levels:   T11-T12: Seen only on the sagittal images. Minimal disc bulge with left paracentral protrusion. No spinal canal stenosis or neural foraminal narrowing.   T12-L1: No significant disc bulge. No spinal canal stenosis or neural foraminal narrowing.   L1-L2: No significant disc bulge. No spinal canal stenosis or  neural foraminal narrowing.   L2-L3: No significant disc bulge. Mild facet arthropathy. No spinal canal stenosis or neural foraminal narrowing.   L3-L4: No significant disc bulge. Mild facet arthropathy. No spinal canal stenosis or neural foraminal narrowing.   L4-L5: Mild disc bulge with left extreme lateral fissure. The lateral aspects of the disc may contact the exiting L4 nerves. Moderate to severe left and moderate right facet arthropathy. Narrowing of the lateral recesses. Previously noted disc extrusion is no longer seen. Mild spinal canal stenosis. Mild bilateral neural foraminal narrowing.   L5-S1: Mild disc bulge with superimposed right subarticular disc extrusion with 5 mm of caudal migration (series 6, image 8 and  series 14, image 40), which effaces the right lateral recess and contacts the descending right S1 nerve roots. No spinal canal stenosis. Mild right neural foraminal narrowing.   IMPRESSION: 1. L4-L5 mild spinal canal stenosis and mild bilateral neural foraminal narrowing. Narrowing of the lateral recesses at this level likely affects the descending L5 nerve roots. The lateral aspects of the disc bulge at this level, including a left extreme lateral annular fissure, may contact the exiting L4 nerves. 2. L5-S1 mild right neural foraminal narrowing. A right subarticular disc extrusion effaces the right lateral recess and contacts the descending right S1 nerve roots.     Electronically Signed   By: Wiliam Ke M.D.   On: 08/27/2022 16:14  I have personally reviewed the images and agree with the above interpretation.  Assessment and Plan: [name] is a pleasant 62 y.o. female has history of chronic constant LBP with right leg pain with flare ups. Last one was about a month ago and she has since improved. LBP is no longer severe and right leg pain is gone. She has numbness in right lateral foot that is improving.   She has know lumbar spondylosis with  disc bulging at L4-S1. Mild central stenosis L4-L5.   Treatment options discussed with patient and following plan made:   - She wants to hold on formal PT. Given lumbar HEP. Avoid anything that makes her pain worse.  - Continue current medications including prn motrin and robaxin. Reviewed dosing and side effects. Take motrin with food. - Prescription for neurontin to take at night. Reviewed dosing and side effects. Call if any issues.  - Referral to PM[facility] at [facility] to discuss possible lumbar injections.  - Follow up for phone visit with me in 6-8 weeks and prn.  - Call if she has a flare up. Would consider repeat dose pack.   I spent a total of 35 minutes in face-to-face and non-face-to-face activities related to this patient's care today including review of outside records, review of imaging, review of symptoms, physical exam, discussion of differential diagnosis, discussion of treatment options, and documentation.   Thank you for involving me in the care of this patient.   Drake Leach PA-C Dept. of Neurosurgery

## 2022-09-11 NOTE — Telephone Encounter (Signed)
Requested medication (s) are due for refill today: no  Requested medication (s) are on the active medication list: yes  Last refill:  09/11/22 #30  Future visit scheduled: no  Notes to clinic:  NT not delegated to refuse this med per protocol   Requested Prescriptions  Pending Prescriptions Disp Refills   methocarbamol (ROBAXIN) 500 MG tablet [Pharmacy Med Name: METHOCARBAMOL 500 MG TABS 500 Tablet] 30 tablet 0    Sig: TAKE 1 TABLET BY MOUTH EVERY 8 HOURS AS NEEDED FOR MUSCLE SPASMS.     Not Delegated - Analgesics:  Muscle Relaxants Failed - 09/11/2022  1:30 PM      Failed - This refill cannot be delegated      Passed - Valid encounter within last 6 months    Recent Outpatient Visits           2 weeks ago Vaginal burning   [place] [name] [facility] [place], [place] W, NP   3 weeks ago Chronic right-sided low back pain with right-sided sciatica   [place] [facility] [place], Salvadore Oxford, NP   4 weeks ago Sciatica of right side   [place] [facility] Mecum, Oswaldo Conroy, PA-C   1 month ago Urinary urgency   [place] [facility] [place], [place] W, NP   2 months ago Type 2 diabetes mellitus without complication, without long-term current use of insulin [facility])   [place] [facility] [place], Salvadore Oxford, NP       Future Appointments             In 2 weeks [place], Salvadore Oxford, NP [place] [facility]   In 3 months [place], Salvadore Oxford, NP [facility], [facility]

## 2022-09-15 ENCOUNTER — Encounter: Payer: Self-pay | Admitting: Obstetrics and Gynecology

## 2022-09-16 ENCOUNTER — Encounter: Payer: BC Managed Care – PPO | Admitting: Obstetrics and Gynecology

## 2022-09-16 ENCOUNTER — Encounter: Payer: Self-pay | Admitting: Orthopedic Surgery

## 2022-09-16 ENCOUNTER — Ambulatory Visit (INDEPENDENT_AMBULATORY_CARE_PROVIDER_SITE_OTHER): Payer: BC Managed Care – PPO | Admitting: Orthopedic Surgery

## 2022-09-16 VITALS — BP 130/78 | Ht 67.0 in | Wt 231.0 lb

## 2022-09-16 DIAGNOSIS — M4726 Other spondylosis with radiculopathy, lumbar region: Secondary | ICD-10-CM

## 2022-09-16 DIAGNOSIS — M47816 Spondylosis without myelopathy or radiculopathy, lumbar region: Secondary | ICD-10-CM

## 2022-09-16 DIAGNOSIS — M48061 Spinal stenosis, lumbar region without neurogenic claudication: Secondary | ICD-10-CM | POA: Diagnosis not present

## 2022-09-16 DIAGNOSIS — M5416 Radiculopathy, lumbar region: Secondary | ICD-10-CM

## 2022-09-16 MED ORDER — GABAPENTIN 300 MG PO CAPS
300.0000 mg | ORAL_CAPSULE | Freq: Every day | ORAL | 1 refills | Status: DC
Start: 2022-09-16 — End: 2022-11-20

## 2022-09-16 NOTE — Patient Instructions (Signed)
It was so nice to see you today. Thank you so much for coming in.    You have some wear and tear in your back (arthritis) with some disc bulges at L4-L5 and L5-S1.   I sent a prescription for gabapentin to help with nerve pain. Take as directed at night. You need to take this everyday. Can make you feel sleepy, dizzy, or drunk. This is a low dose so you likely will not have side effects. Call me with concerns.   I want you to see physical medicine and rehab at the [facility] to discuss possible injections in your lower back. Dr. Yves Dill, Dr. Mariah Milling, and their PA Alphonzo Lemmings are great and will take good care of you. They should call you to schedule an appointment or you can call them at [phone].   I gave you some exercises for your lower back. Skip any that make your pain worse.   We have phone visit in 6-8 weeks. Please do not hesitate to call if you have any questions or concerns. You can also message me in MyChart.   Drake Leach PA-C [phone]

## 2022-09-21 ENCOUNTER — Ambulatory Visit: Payer: BC Managed Care – PPO | Admitting: Orthopedic Surgery

## 2022-09-23 ENCOUNTER — Ambulatory Visit: Payer: BC Managed Care – PPO | Admitting: Dietician

## 2022-09-28 ENCOUNTER — Ambulatory Visit: Payer: BC Managed Care – PPO | Admitting: Internal Medicine

## 2022-10-07 ENCOUNTER — Ambulatory Visit: Payer: BC Managed Care – PPO | Admitting: Podiatry

## 2022-10-12 ENCOUNTER — Ambulatory Visit: Payer: BC Managed Care – PPO | Admitting: Internal Medicine

## 2022-10-13 ENCOUNTER — Ambulatory Visit: Payer: BC Managed Care – PPO | Admitting: Internal Medicine

## 2022-10-13 NOTE — Progress Notes (Deleted)
Subjective:    Patient ID: Diana [name], female    DOB: Jun 29, 1960, 62 y.o.   MRN: [id number]  HPI  Patient presents to the clinic today for follow-up of chronic conditions.  HTN: Her BP today is.  She is taking losartan HCT as prescribed.  ECG from 06/2021 reviewed.  Anxiety: Persistent, managed on clonazepam as needed with good results.  She is not currently seeing a therapist.  She denies depression, SI/HI.  GERD: She is not sure what triggers this.  She denies breakthrough on omeprazole.  There is no upper GI on file.  Chronic hep B: In remission status post antiviral therapy.  She follows with GI.  Chronic low back pain with sciatica: She is taking gabapentin as prescribed and methocarbamol as needed with some relief of symptoms.  MRI from 08/2022 reviewed.  She follows with neurosurgery.  DM2: Her last A1c was 7%, 06/2022.  She is not taking any oral diabetic medication this time.  She does not check her sugars.  Her last eye exam was.  Flu.  Pneumovax.  COVID.  HLD: Her last LDL was 103, triglycerides 158, 06/2022.  She denies myalgias on atorvastatin.  She does not consume a low-fat diet.  Review of Systems     Past Medical History:  Diagnosis Date   Allergy    Ear infection    recurrent   Hypertension     Current Outpatient Medications  Medication Sig Dispense Refill   Acetaminophen (TYLENOL ARTHRITIS PAIN PO) Take by mouth.     Ascorbic Acid 125 MG CHEW Chew 1 each by mouth daily.     atorvastatin (LIPITOR) 10 MG tablet Take 1 tablet (10 mg total) by mouth daily. (Patient taking differently: Take 5 mg by mouth daily.) 90 tablet 1   cetirizine (ZYRTEC) 10 MG tablet Take 10 mg by mouth daily as needed.     Cholecalciferol (VITAMIN D3) 25 MCG (1000 UT) CAPS Take 5,000 Units by mouth.      clonazePAM (KLONOPIN) 0.5 MG tablet Take 1 tablet (0.5 mg total) by mouth daily as needed. for anxiety 30 tablet 0   gabapentin (NEURONTIN) 300 MG capsule Take 1 capsule (300 mg  total) by mouth at bedtime. 30 capsule 1   hydrocortisone (ANUSOL-HC) 2.5 % rectal cream Place 1 Application rectally 2 (two) times daily. 30 g 0   losartan-hydrochlorothiazide (HYZAAR) 50-12.5 MG tablet TAKE 1 TABLET BY MOUTH DAILY. 90 tablet 1   methocarbamol (ROBAXIN) 500 MG tablet Take 1 tablet (500 mg total) by mouth every 8 (eight) hours as needed for muscle spasms. 30 tablet 0   omeprazole (PRILOSEC) 40 MG capsule Take 1 capsule (40 mg total) by mouth 2 (two) times daily. 180 capsule 1   Turmeric (QC TUMERIC COMPLEX) 500 MG CAPS Take by mouth.     No current facility-administered medications for this visit.    No Known Allergies  Family History  Problem Relation Age of Onset   Hypertension Mother        alive   Arthritis Mother    Thyroid disease Mother    Lung cancer Father        deceased   Heart disease Father    Hypertension Maternal Grandmother     Social History   Socioeconomic History   Marital status: Widowed    Spouse name: Not on file   Number of children: 3   Years of education: Not on file   Highest education level: Not on file  Occupational History   Occupation: Nutritional therapist: [employer]  Tobacco Use   Smoking status: Former    Types: Cigarettes    Quit date: 04/20/1985    Years since quitting: 37.5    Passive exposure: Past   Smokeless tobacco: Never   Tobacco comments:    remote tobacco use   Vaping Use   Vaping Use: Never used  Substance and Sexual Activity   Alcohol use: No    Alcohol/week: 0.0 standard drinks of alcohol   Drug use: No   Sexual activity: Not Currently  Other Topics Concern   Not on file  Social History Narrative   Not on file   Social Determinants of Health   Financial Resource Strain: Not on file  Food Insecurity: Not on file  Transportation Needs: Not on file  Physical Activity: Not on file  Stress: Not on file  Social Connections: Not on file  Intimate Partner Violence: Not on file      Constitutional: Denies fever, malaise, fatigue, headache or abrupt weight changes.  HEENT: Denies eye pain, eye redness, ear pain, ringing in the ears, wax buildup, runny nose, nasal congestion, bloody nose, or sore throat. Respiratory: Denies difficulty breathing, shortness of breath, cough or sputum production.   Cardiovascular: Denies chest pain, chest tightness, palpitations or swelling in the hands or feet.  Gastrointestinal: Denies abdominal pain, bloating, constipation, diarrhea or blood in the stool.  GU: Denies urgency, frequency, pain with urination, burning sensation, blood in urine, odor or discharge. Musculoskeletal: Patient reports chronic low back pain.  Denies decrease in range of motion, difficulty with gait, or joint swelling.  Skin: Denies redness, rashes, lesions or ulcercations.  Neurological: Patient reports paresthesia of right lower extremity.  Denies dizziness, difficulty with memory, difficulty with speech or problems with balance and coordination.  Psych: Patient has a history of anxiety.  Denies depression, SI/HI.  No other specific complaints in a complete review of systems (except as listed in HPI above).  Objective:   Physical Exam   LMP 01/20/2016  Wt Readings from Last 3 Encounters:  09/16/22 231 lb (104.8 kg)  08/26/22 227 lb (103 kg)  08/17/22 231 lb (104.8 kg)    General: Appears their stated age, well developed, well nourished in NAD. Skin: Warm, dry and intact. No rashes, lesions or ulcerations noted. HEENT: Head: normal shape and size; Eyes: sclera white, no icterus, conjunctiva pink, PERRLA and EOMs intact; Ears: Tm's gray and intact, normal light reflex; Nose: mucosa pink and moist, septum midline; Throat/Mouth: Teeth present, mucosa pink and moist, no exudate, lesions or ulcerations noted.  Neck:  Neck supple, trachea midline. No masses, lumps or thyromegaly present.  Cardiovascular: Normal rate and rhythm. S1,S2 noted.  No murmur, rubs or  gallops noted. No JVD or BLE edema. No carotid bruits noted. Pulmonary/Chest: Normal effort and positive vesicular breath sounds. No respiratory distress. No wheezes, rales or ronchi noted.  Abdomen: Soft and nontender. Normal bowel sounds. No distention or masses noted. Liver, spleen and kidneys non palpable. Musculoskeletal: Normal range of motion. No signs of joint swelling. No difficulty with gait.  Neurological: Alert and oriented. Cranial nerves II-XII grossly intact. Coordination normal.  Psychiatric: Mood and affect normal. Behavior is normal. Judgment and thought content normal.     BMET    Component Value Date/Time   NA 141 06/26/2022 1539   K 3.4 (L) 06/26/2022 1539   CL 99 06/26/2022 1539   CO2 31 06/26/2022 1539  GLUCOSE 120 (H) 06/26/2022 1539   BUN 9 06/26/2022 1539   CREATININE 0.71 06/26/2022 1539   CALCIUM 9.2 06/26/2022 1539   GFRNONAA >60 07/02/2021 2000   GFRAA >60 07/27/2017 2153    Lipid Panel     Component Value Date/Time   CHOL 144 06/26/2022 1539   CHOL 189 07/28/2021 1553   TRIG 158 (H) 06/26/2022 1539   TRIG 120 07/28/2021 1553   HDL 16 (L) 06/26/2022 1539   CHOLHDL 9.0 (H) 06/26/2022 1539   VLDL 21.0 02/09/2019 1245   LDLCALC 103 (H) 06/26/2022 1539    CBC    Component Value Date/Time   WBC 6.1 06/26/2022 1539   RBC 4.37 06/26/2022 1539   HGB 13.1 06/26/2022 1539   HCT 39.9 06/26/2022 1539   PLT 257 06/26/2022 1539   MCV 91.3 06/26/2022 1539   MCH 30.0 06/26/2022 1539   MCHC 32.8 06/26/2022 1539   RDW 12.9 06/26/2022 1539   LYMPHSABS 1.1 07/28/2016 1232   MONOABS 0.4 07/28/2016 1232   EOSABS 0.0 07/28/2016 1232   BASOSABS 0.0 07/28/2016 1232    Hgb A1C Lab Results  Component Value Date   HGBA1C 7.0 (H) 06/26/2022           Assessment & Plan:     RTC in 3 months, follow-up chronic conditions [name], NP

## 2022-10-19 ENCOUNTER — Encounter: Payer: Self-pay | Admitting: Internal Medicine

## 2022-10-26 ENCOUNTER — Ambulatory Visit: Payer: BC Managed Care – PPO | Admitting: Internal Medicine

## 2022-10-26 VITALS — BP 132/78 | HR 79 | Temp 97.2°F | Wt 239.0 lb

## 2022-10-26 DIAGNOSIS — I10 Essential (primary) hypertension: Secondary | ICD-10-CM | POA: Diagnosis not present

## 2022-10-26 DIAGNOSIS — Z6837 Body mass index (BMI) 37.0-37.9, adult: Secondary | ICD-10-CM

## 2022-10-26 DIAGNOSIS — E119 Type 2 diabetes mellitus without complications: Secondary | ICD-10-CM

## 2022-10-26 DIAGNOSIS — E1169 Type 2 diabetes mellitus with other specified complication: Secondary | ICD-10-CM

## 2022-10-26 DIAGNOSIS — K219 Gastro-esophageal reflux disease without esophagitis: Secondary | ICD-10-CM

## 2022-10-26 DIAGNOSIS — B181 Chronic viral hepatitis B without delta-agent: Secondary | ICD-10-CM | POA: Diagnosis not present

## 2022-10-26 DIAGNOSIS — F32A Depression, unspecified: Secondary | ICD-10-CM

## 2022-10-26 DIAGNOSIS — M5441 Lumbago with sciatica, right side: Secondary | ICD-10-CM

## 2022-10-26 DIAGNOSIS — G8929 Other chronic pain: Secondary | ICD-10-CM

## 2022-10-26 DIAGNOSIS — E785 Hyperlipidemia, unspecified: Secondary | ICD-10-CM

## 2022-10-26 DIAGNOSIS — F419 Anxiety disorder, unspecified: Secondary | ICD-10-CM

## 2022-10-26 LAB — CBC
Hemoglobin: 12.9 g/dL (ref 11.7–15.5)
MCH: 30.4 pg (ref 27.0–33.0)
MCHC: 33.2 g/dL (ref 32.0–36.0)
MCV: 91.5 fL (ref 80.0–100.0)
MPV: 11.5 fL (ref 7.5–12.5)
RDW: 13.3 % (ref 11.0–15.0)

## 2022-10-26 LAB — POCT GLYCOSYLATED HEMOGLOBIN (HGB A1C): Hemoglobin A1C: 6.8 % — AB (ref 4.0–5.6)

## 2022-10-26 MED ORDER — RYBELSUS 3 MG PO TABS: 1.0000 | ORAL_TABLET | Freq: Every day | ORAL | 1 refills | Status: DC

## 2022-10-26 NOTE — Assessment & Plan Note (Signed)
Stable on clonazepam as needed Will monitor

## 2022-10-26 NOTE — Assessment & Plan Note (Signed)
Encourage regular stretching and core strengthening Continue methocarbamol and gabapentin as prescribed She will follow-up with neurosurgery as needed

## 2022-10-26 NOTE — Progress Notes (Signed)
Subjective:    Patient ID: Diana [name], female    DOB: [birth year], 62 y.o.   MRN: [id number]  HPI  Patient presents to clinic today for follow-up of chronic conditions.  HTN: Her BP today is 132/78.  She is taking losartan HCT as prescribed.  ECG from 06/2021 reviewed.  Anxiety: Persistent.  Managed with clonazepam as needed.  She is not currently seeing a therapist.  She denies depression, SI/HI.  GERD: She is not sure what triggers this.  She denies breakthrough on omeprazole.  There is no upper GI on file.  Chronic hep B: In remission status post antiviral therapy.  She follows with GI.  Chronic low back pain with right-sided sciatica: Managed with methocarbamol and gabapentin.  MRI from 08/2022 reviewed.  She is following with neurosurgery.  DM2: Her last A1c was 7%, 06/2022.  She is not taking any oral diabetic medication at this time.  She does not check her sugars.  She checks her feet routinely.  Her last eye exam was 1 month ago.  Flu never.  Pneumovax never.  COVID never.  HLD: Her last LDL was 103, triglycerides 158, 06/2022.  She denies myalgias on atorvastatin.  She does not consume low-fat diet.  Review of Systems     Past Medical History:  Diagnosis Date   Allergy    Ear infection    recurrent   Hypertension     Current Outpatient Medications  Medication Sig Dispense Refill   Acetaminophen (TYLENOL ARTHRITIS PAIN PO) Take by mouth.     Ascorbic Acid 125 MG CHEW Chew 1 each by mouth daily.     atorvastatin (LIPITOR) 10 MG tablet Take 1 tablet (10 mg total) by mouth daily. (Patient taking differently: Take 5 mg by mouth daily.) 90 tablet 1   cetirizine (ZYRTEC) 10 MG tablet Take 10 mg by mouth daily as needed.     Cholecalciferol (VITAMIN D3) 25 MCG (1000 UT) CAPS Take 5,000 Units by mouth.      clonazePAM (KLONOPIN) 0.5 MG tablet Take 1 tablet (0.5 mg total) by mouth daily as needed. for anxiety 30 tablet 0   gabapentin (NEURONTIN) 300 MG capsule Take 1 capsule  (300 mg total) by mouth at bedtime. 30 capsule 1   hydrocortisone (ANUSOL-HC) 2.5 % rectal cream Place 1 Application rectally 2 (two) times daily. 30 g 0   losartan-hydrochlorothiazide (HYZAAR) 50-12.5 MG tablet TAKE 1 TABLET BY MOUTH DAILY. 90 tablet 1   methocarbamol (ROBAXIN) 500 MG tablet Take 1 tablet (500 mg total) by mouth every 8 (eight) hours as needed for muscle spasms. 30 tablet 0   omeprazole (PRILOSEC) 40 MG capsule Take 1 capsule (40 mg total) by mouth 2 (two) times daily. 180 capsule 1   Turmeric (QC TUMERIC COMPLEX) 500 MG CAPS Take by mouth.     No current facility-administered medications for this visit.    No Known Allergies  Family History  Problem Relation Age of Onset   Hypertension Mother        alive   Arthritis Mother    Thyroid disease Mother    Lung cancer Father        deceased   Heart disease Father    Hypertension Maternal Grandmother     Social History   Socioeconomic History   Marital status: Widowed    Spouse name: Not on file   Number of children: 3   Years of education: Not on file   Highest education level: 12th  grade  Occupational History   Occupation: Nutritional therapist: [employer]  Tobacco Use   Smoking status: Former    Types: Cigarettes    Quit date: 04/20/1985    Years since quitting: 37.5    Passive exposure: Past   Smokeless tobacco: Never   Tobacco comments:    remote tobacco use   Vaping Use   Vaping Use: Never used  Substance and Sexual Activity   Alcohol use: No    Alcohol/week: 0.0 standard drinks of alcohol   Drug use: No   Sexual activity: Not Currently  Other Topics Concern   Not on file  Social History Narrative   Not on file   Social Determinants of Health   Financial Resource Strain: Low Risk  (10/26/2022)   Overall Financial Resource Strain (CARDIA)    Difficulty of Paying Living Expenses: Not very hard  Food Insecurity: No Food Insecurity (10/26/2022)   Hunger Vital Sign    Worried About  Running Out of Food in the Last Year: Never true    Ran Out of Food in the Last Year: Never true  Transportation Needs: No Transportation Needs (10/26/2022)   PRAPARE - Administrator, Civil Service (Medical): No    Lack of Transportation (Non-Medical): No  Physical Activity: Unknown (10/26/2022)   Exercise Vital Sign    Days of Exercise per Week: 0 days    Minutes of Exercise per Session: Not on file  Stress: No Stress Concern Present (10/26/2022)   [employer] of Occupational Health - Occupational Stress Questionnaire    Feeling of Stress : Not at all  Social Connections: Socially Isolated (10/26/2022)   Social Connection and Isolation Panel [NHANES]    Frequency of Communication with Friends and Family: More than three times a week    Frequency of Social Gatherings with Friends and Family: Once a week    Attends Religious Services: Never    Database administrator or Organizations: No    Attends Engineer, structural: Not on file    Marital Status: Widowed  Intimate Partner Violence: Not on file     Constitutional: Denies fever, malaise, fatigue, headache or abrupt weight changes.  HEENT: Denies eye pain, eye redness, ear pain, ringing in the ears, wax buildup, runny nose, nasal congestion, bloody nose, or sore throat. Respiratory: Denies difficulty breathing, shortness of breath, cough or sputum production.   Cardiovascular: Denies chest pain, chest tightness, palpitations or swelling in the hands or feet.  Gastrointestinal: Denies abdominal pain, bloating, constipation, diarrhea or blood in the stool.  GU: Denies urgency, frequency, pain with urination, burning sensation, blood in urine, odor or discharge. Musculoskeletal: Patient reports chronic low back pain.  Denies decrease in range of motion, difficulty with gait, or joint swelling.  Skin: Denies redness, rashes, lesions or ulcercations.  Neurological: Denies dizziness, difficulty with memory, difficulty  with speech or problems with balance and coordination.  Psych: Patient has history of anxiety.  Denies depression, SI/HI.  No other specific complaints in a complete review of systems (except as listed in HPI above).  Objective:   Physical Exam  BP 132/78 (BP Location: Left Arm, Patient Position: Sitting, Cuff Size: Large)   Pulse 79   Temp (!) 97.2 F (36.2 C) (Temporal)   Wt 239 lb (108.4 kg)   LMP 01/20/2016   SpO2 97%   BMI 37.43 kg/m   Wt Readings from Last 3 Encounters:  09/16/22 231 lb (104.8 kg)  08/26/22 227 lb (103 kg)  08/17/22 231 lb (104.8 kg)    General: Appears her stated age, obese, in NAD. Skin: Warm, dry and intact. No ulcerations noted. HEENT: Head: normal shape and size; Eyes: sclera white, no icterus, conjunctiva pink, PERRLA and EOMs intact;   Cardiovascular: Normal rate and rhythm. S1,S2 noted.  No murmur, rubs or gallops noted. No JVD or BLE edema. No carotid bruits noted. Pulmonary/Chest: Normal effort and positive vesicular breath sounds. No respiratory distress. No wheezes, rales or ronchi noted.  Abdomen: Soft and nontender. Normal bowel sounds.  Musculoskeletal:  No difficulty with gait.  Neurological: Alert and oriented. Coordination normal.  Psychiatric: Mood and affect normal. Behavior is normal. Judgment and thought content normal.    BMET    Component Value Date/Time   NA 141 06/26/2022 1539   K 3.4 (L) 06/26/2022 1539   CL 99 06/26/2022 1539   CO2 31 06/26/2022 1539   GLUCOSE 120 (H) 06/26/2022 1539   BUN 9 06/26/2022 1539   CREATININE 0.71 06/26/2022 1539   CALCIUM 9.2 06/26/2022 1539   GFRNONAA >60 07/02/2021 2000   GFRAA >60 07/27/2017 2153    Lipid Panel     Component Value Date/Time   CHOL 144 06/26/2022 1539   CHOL 189 07/28/2021 1553   TRIG 158 (H) 06/26/2022 1539   TRIG 120 07/28/2021 1553   HDL 16 (L) 06/26/2022 1539   CHOLHDL 9.0 (H) 06/26/2022 1539   VLDL 21.0 02/09/2019 1245   LDLCALC 103 (H) 06/26/2022 1539     CBC    Component Value Date/Time   WBC 6.1 06/26/2022 1539   RBC 4.37 06/26/2022 1539   HGB 13.1 06/26/2022 1539   HCT 39.9 06/26/2022 1539   PLT 257 06/26/2022 1539   MCV 91.3 06/26/2022 1539   MCH 30.0 06/26/2022 1539   MCHC 32.8 06/26/2022 1539   RDW 12.9 06/26/2022 1539   LYMPHSABS 1.1 07/28/2016 1232   MONOABS 0.4 07/28/2016 1232   EOSABS 0.0 07/28/2016 1232   BASOSABS 0.0 07/28/2016 1232    Hgb A1C Lab Results  Component Value Date   HGBA1C 7.0 (H) 06/26/2022           Assessment & Plan:     RTC in 6 months follow up chronic conditions [name], NP

## 2022-10-26 NOTE — Assessment & Plan Note (Signed)
C-Met and lipid profile today Encouraged her to consume a low-fat diet Continue atorvastatin 

## 2022-10-26 NOTE — Assessment & Plan Note (Signed)
POCT A1c 6.8% Encourage low-carb diet and exercise for weight loss Will trial Rybelsus Encourage routine eye exam Encouraged foot exam She declines immunizations

## 2022-10-26 NOTE — Assessment & Plan Note (Signed)
Avoid foods that trigger reflux Encourage weight loss as this can help reduce reflux symptoms Continue omeprazole 

## 2022-10-26 NOTE — Assessment & Plan Note (Signed)
Asymptomatic. 

## 2022-10-26 NOTE — Assessment & Plan Note (Signed)
Controlled on losartan HCT Reinforced DASH diet and exercise for weight loss C-Met today 

## 2022-10-26 NOTE — Patient Instructions (Signed)
Health Maintenance for Postmenopausal Women Menopause is a normal process in which your ability to get pregnant comes to an end. This process happens slowly over many months or years, usually between the ages of 48 and 55. Menopause is complete when you have missed your menstrual period for 12 months. It is important to talk with your health care provider about some of the most common conditions that affect women after menopause (postmenopausal women). These include heart disease, cancer, and bone loss (osteoporosis). Adopting a healthy lifestyle and getting preventive care can help to promote your health and wellness. The actions you take can also lower your chances of developing some of these common conditions. What are the signs and symptoms of menopause? During menopause, you may have the following symptoms: Hot flashes. These can be moderate or severe. Night sweats. Decrease in sex drive. Mood swings. Headaches. Tiredness (fatigue). Irritability. Memory problems. Problems falling asleep or staying asleep. Talk with your health care provider about treatment options for your symptoms. Do I need hormone replacement therapy? Hormone replacement therapy is effective in treating symptoms that are caused by menopause, such as hot flashes and night sweats. Hormone replacement carries certain risks, especially as you become older. If you are thinking about using estrogen or estrogen with progestin, discuss the benefits and risks with your health care provider. How can I reduce my risk for heart disease and stroke? The risk of heart disease, heart attack, and stroke increases as you age. One of the causes may be a change in the body's hormones during menopause. This can affect how your body uses dietary fats, triglycerides, and cholesterol. Heart attack and stroke are medical emergencies. There are many things that you can do to help prevent heart disease and stroke. Watch your blood pressure High  blood pressure causes heart disease and increases the risk of stroke. This is more likely to develop in people who have high blood pressure readings or are overweight. Have your blood pressure checked: Every 3-5 years if you are 18-39 years of age. Every year if you are 40 years old or older. Eat a healthy diet  Eat a diet that includes plenty of vegetables, fruits, low-fat dairy products, and lean protein. Do not eat a lot of foods that are high in solid fats, added sugars, or sodium. Get regular exercise Get regular exercise. This is one of the most important things you can do for your health. Most adults should: Try to exercise for at least 150 minutes each week. The exercise should increase your heart rate and make you sweat (moderate-intensity exercise). Try to do strengthening exercises at least twice each week. Do these in addition to the moderate-intensity exercise. Spend less time sitting. Even light physical activity can be beneficial. Other tips Work with your health care provider to achieve or maintain a healthy weight. Do not use any products that contain nicotine or tobacco. These products include cigarettes, chewing tobacco, and vaping devices, such as e-cigarettes. If you need help quitting, ask your health care provider. Know your numbers. Ask your health care provider to check your cholesterol and your blood sugar (glucose). Continue to have your blood tested as directed by your health care provider. Do I need screening for cancer? Depending on your health history and family history, you may need to have cancer screenings at different stages of your life. This may include screening for: Breast cancer. Cervical cancer. Lung cancer. Colorectal cancer. What is my risk for osteoporosis? After menopause, you may be   at increased risk for osteoporosis. Osteoporosis is a condition in which bone destruction happens more quickly than new bone creation. To help prevent osteoporosis or  the bone fractures that can happen because of osteoporosis, you may take the following actions: If you are 19-50 years old, get at least 1,000 mg of calcium and at least 600 international units (IU) of vitamin D per day. If you are older than age 50 but younger than age 70, get at least 1,200 mg of calcium and at least 600 international units (IU) of vitamin D per day. If you are older than age 70, get at least 1,200 mg of calcium and at least 800 international units (IU) of vitamin D per day. Smoking and drinking excessive alcohol increase the risk of osteoporosis. Eat foods that are rich in calcium and vitamin D, and do weight-bearing exercises several times each week as directed by your health care provider. How does menopause affect my mental health? Depression may occur at any age, but it is more common as you become older. Common symptoms of depression include: Feeling depressed. Changes in sleep patterns. Changes in appetite or eating patterns. Feeling an overall lack of motivation or enjoyment of activities that you previously enjoyed. Frequent crying spells. Talk with your health care provider if you think that you are experiencing any of these symptoms. General instructions See your health care provider for regular wellness exams and vaccines. This may include: Scheduling regular health, dental, and eye exams. Getting and maintaining your vaccines. These include: Influenza vaccine. Get this vaccine each year before the flu season begins. Pneumonia vaccine. Shingles vaccine. Tetanus, diphtheria, and pertussis (Tdap) booster vaccine. Your health care provider may also recommend other immunizations. Tell your health care provider if you have ever been abused or do not feel safe at home. Summary Menopause is a normal process in which your ability to get pregnant comes to an end. This condition causes hot flashes, night sweats, decreased interest in sex, mood swings, headaches, or lack  of sleep. Treatment for this condition may include hormone replacement therapy. Take actions to keep yourself healthy, including exercising regularly, eating a healthy diet, watching your weight, and checking your blood pressure and blood sugar levels. Get screened for cancer and depression. Make sure that you are up to date with all your vaccines. This information is not intended to replace advice given to you by your health care provider. Make sure you discuss any questions you have with your health care provider. Document Revised: 08/26/2020 Document Reviewed: 08/26/2020 Elsevier Patient Education  2024 [employer]  

## 2022-10-26 NOTE — Assessment & Plan Note (Signed)
Encourage diet and exercise for weight loss 

## 2022-10-27 ENCOUNTER — Telehealth: Payer: Self-pay

## 2022-10-27 LAB — HEMOGLOBIN A1C
Hgb A1c MFr Bld: 6.7 % of total Hgb — ABNORMAL HIGH (ref <5.7)
Mean Plasma Glucose: 146 mg/dL
eAG (mmol/L): 8.1 mmol/L

## 2022-10-27 LAB — LIPID PANEL
Cholesterol: 134 mg/dL (ref <200)
HDL: 18 mg/dL — ABNORMAL LOW (ref >=50)
LDL Cholesterol (Calc): 92 mg/dL (calc)
Non-HDL Cholesterol (Calc): 116 mg/dL (calc) (ref <130)
Total CHOL/HDL Ratio: 7.4 (calc) — ABNORMAL HIGH (ref <5.0)
Triglycerides: 147 mg/dL (ref <150)

## 2022-10-27 LAB — CBC
HCT: 38.8 % (ref 35.0–45.0)
Platelets: 237 10*3/uL (ref 140–400)
RBC: 4.24 10*6/uL (ref 3.80–5.10)
WBC: 5.7 10*3/uL (ref 3.8–10.8)

## 2022-10-27 LAB — COMPLETE METABOLIC PANEL WITH GFR
AG Ratio: 1.8 (calc) (ref 1.0–2.5)
ALT: 12 U/L (ref 6–29)
AST: 11 U/L (ref 10–35)
Albumin: 4.1 g/dL (ref 3.6–5.1)
Alkaline phosphatase (APISO): 81 U/L (ref 37–153)
BUN: 16 mg/dL (ref 7–25)
CO2: 30 mmol/L (ref 20–32)
Calcium: 9 mg/dL (ref 8.6–10.4)
Chloride: 104 mmol/L (ref 98–110)
Creat: 0.61 mg/dL (ref 0.50–1.03)
Globulin: 2.3 g/dL (calc) (ref 1.9–3.7)
Glucose, Bld: 126 mg/dL (ref 65–139)
Potassium: 3.7 mmol/L (ref 3.5–5.3)
Sodium: 142 mmol/L (ref 135–146)
Total Bilirubin: 1 mg/dL (ref 0.2–1.2)
Total Protein: 6.4 g/dL (ref 6.1–8.1)
eGFR: 103 mL/min/{1.73_m2} (ref >=60)

## 2022-10-27 LAB — MICROALBUMIN / CREATININE URINE RATIO
Creatinine, Urine: 113 mg/dL (ref 20–275)
Microalb Creat Ratio: 4 mg/g creat (ref <30)
Microalb, Ur: 0.5 mg/dL

## 2022-10-27 NOTE — Telephone Encounter (Signed)
Copied from CRM [id number]. Topic: General - Other >> Oct 27, 2022  4:08 PM Everette C wrote: Reason for CRM: The patient has been directed to contact their PCP and request prior authorization for their Semaglutide (RYBELSUS) 3 MG TABS [[id number]]  Please contact the patient further if needed

## 2022-10-27 NOTE — Telephone Encounter (Signed)
PT submitted to Cover My Meds.    Thanks,   -Vernona Rieger

## 2022-10-28 NOTE — Telephone Encounter (Signed)
Pt's Rybelsus has been approved.   Thanks,   -Vernona Rieger

## 2022-11-04 ENCOUNTER — Ambulatory Visit: Payer: BC Managed Care – PPO | Admitting: Podiatry

## 2022-11-04 DIAGNOSIS — M7662 Achilles tendinitis, left leg: Secondary | ICD-10-CM

## 2022-11-04 DIAGNOSIS — M722 Plantar fascial fibromatosis: Secondary | ICD-10-CM

## 2022-11-04 NOTE — Progress Notes (Signed)
Subjective:  Patient ID: Diana [name], female    DOB: 1960-08-08,  MRN: [id number]  Chief Complaint  Patient presents with   Plantar Fasciitis    Pt stated that she is still having some discomfort with her left foot     62 y.o. female presents with the above complaint.  Patient presents for follow-up of left Achilles tendinitis.  Patient states that it is still hurting her.  The right side is doing well.  She does not any pain to the right foot.  She would like to discuss next treatment plan for the left side.   Review of Systems: Negative except as noted in the HPI. Denies N/V/F/Ch.  Past Medical History:  Diagnosis Date   Allergy    Ear infection    recurrent   Hypertension     Current Outpatient Medications:    Acetaminophen (TYLENOL ARTHRITIS PAIN PO), Take by mouth., Disp: , Rfl:    Ascorbic Acid 125 MG CHEW, Chew 1 each by mouth daily., Disp: , Rfl:    atorvastatin (LIPITOR) 10 MG tablet, Take 1 tablet (10 mg total) by mouth daily. (Patient taking differently: Take 5 mg by mouth daily.), Disp: 90 tablet, Rfl: 1   cetirizine (ZYRTEC) 10 MG tablet, Take 10 mg by mouth daily as needed., Disp: , Rfl:    Cholecalciferol (VITAMIN D3) 25 MCG (1000 UT) CAPS, Take 5,000 Units by mouth. , Disp: , Rfl:    clonazePAM (KLONOPIN) 0.5 MG tablet, Take 1 tablet (0.5 mg total) by mouth daily as needed. for anxiety, Disp: 30 tablet, Rfl: 0   gabapentin (NEURONTIN) 300 MG capsule, Take 1 capsule (300 mg total) by mouth at bedtime., Disp: 30 capsule, Rfl: 1   hydrocortisone (ANUSOL-HC) 2.5 % rectal cream, Place 1 Application rectally 2 (two) times daily., Disp: 30 g, Rfl: 0   losartan-hydrochlorothiazide (HYZAAR) 50-12.5 MG tablet, TAKE 1 TABLET BY MOUTH DAILY., Disp: 90 tablet, Rfl: 1   methocarbamol (ROBAXIN) 500 MG tablet, Take 1 tablet (500 mg total) by mouth every 8 (eight) hours as needed for muscle spasms., Disp: 30 tablet, Rfl: 0   omeprazole (PRILOSEC) 40 MG capsule, Take 1 capsule (40  mg total) by mouth 2 (two) times daily., Disp: 180 capsule, Rfl: 1   Semaglutide (RYBELSUS) 3 MG TABS, Take 1 tablet (3 mg total) by mouth daily., Disp: 90 tablet, Rfl: 1   Turmeric (QC TUMERIC COMPLEX) 500 MG CAPS, Take by mouth., Disp: , Rfl:   Social History   Tobacco Use  Smoking Status Former   Current packs/day: 0.00   Types: Cigarettes   Quit date: 04/20/1985   Years since quitting: 37.5   Passive exposure: Past  Smokeless Tobacco Never  Tobacco Comments   remote tobacco use     No Known Allergies Objective:  There were no vitals filed for this visit. There is no height or weight on file to calculate BMI. Constitutional Well developed. Well nourished.  Vascular Dorsalis pedis pulses palpable bilaterally. Posterior tibial pulses palpable bilaterally. Capillary refill normal to all digits.  No cyanosis or clubbing noted. Pedal hair growth normal.  Neurologic Normal speech. Oriented to person, place, and time. Epicritic sensation to light touch grossly present bilaterally.  Dermatologic Nails well groomed and normal in appearance. No open wounds. No skin lesions.  Orthopedic: Normal joint ROM without pain or crepitus bilaterally. No visible deformities. Tender to palpation at the calcaneal tuber right. No pain with calcaneal squeeze right. Ankle ROM diminished range of motion right.  Silfverskiold Test: positive right.  Pain on palpation of left Achilles insertional pain.  Pain with dorsiflexion of the ankle joint no pain with plantarflexion of the ankle joint.  Clinically able to appreciate Haglund's deformity positive Silfverskiold test noted with gastrocnemius equinus to the left side as well   Radiographs: None Assessment:   1. Left Achilles tendinitis     Plan:  Patient was evaluated and treated and all questions answered.  Left Achilles tendonitis -I explained the patient the etiology of Achilles tendinitis emergency room and options were discussed.   Patient has failed cam boot immobilization helped bring some of her pain down but she states she still has some residual pain.  She will benefit from steroid injection of decrease inflammatory component surgical pain.  Patient agrees with plan like to proceed with steroid injection.  I discussed the risk of rupture associated with -A steroid injection was performed at Left Achilles tendinitis using 1% plain Lidocaine and 10 mg of Kenalog. This was well tolerated.   Plantar Fasciitis, right -Clinically healed and is doing well with the steroid injection and bracing.  At this time patient does not have any pain in the foot I discussed shoe gear modification and possible orthotics management.  No follow-ups on file.

## 2022-11-06 NOTE — Progress Notes (Unsigned)
   Telephone Visit- Progress Note: Referring Physician:  [name], NP [address] [place],  [place] [address]  Primary Physician:  [name], NP  This visit was performed via telephone.  Patient location: home Provider location: office  I spent a total of 10 minutes non-face-to-face activities for this visit on the date of this encounter including review of current clinical condition and response to treatment.    Patient has given verbal consent to this telephone visits and we reviewed the limitations of a telephone visit. Patient wishes to proceed.    Chief Complaint:  follow up  History of Present Illness: Diana [name] is a 62 y.o. female has a history of HTN, GERD, chronic hep B, hyperlipidemia, DM, and prediabetes.   Last seen by me on 09/16/22 for intermittent flare ups of back and right leg pain. She was doing better at her last visit. She has know lumbar spondylosis with disc bulging at L4-S1. Mild central stenosis L4-L5.   She was given lumbar HEP at her last visit and was started in neurotin at night. She was sent to PMR to consider injections as well.    She has not seen PMR- she forgot initially and then was feeling  better.   Overall, she is doing well. She has intermittent LBP that is mostly tolerable. No right leg pain. She still has some numbness in right lateral foot. She notes new mid/thoracic back pain that starts after laying in bed a few hours. Once she gets up, pain goes away.   She is taking neurontin at night. She is taking prn motrin and robaxin. She is not doing HEP I gave her.    Bowel/Bladder Dysfunction: none   Conservative measures:  Physical therapy: did 4 years ago and made her worse Multimodal medical therapy including regular antiinflammatories: prednisone taper, robaxin, mobic, tramadol, motrin, neurontin Injections:  Had 9-10 years ago with no relief    Past Surgery: No spinal surgery   KAMSIYOCHUKWU BUIST has no symptoms of  cervical myelopathy.   The symptoms are causing a significant impact on the patient's life.    Exam: No exam done as this was a telephone encounter.     Imaging: none  Assessment and Plan: Ms. Litsey is a pleasant 62 y.o. female with a history of chronic constant LBP with right leg pain with flare ups.   Overall, she is doing well. She has only intermittent LBP that is mostly tolerable. No right leg pain. She still has some numbness in right lateral foot. She notes new mid/thoracic back pain that starts after laying in bed a few hours. Once she gets up, pain goes away.    She has know lumbar spondylosis with disc bulging at L4-S1. Mild central stenosis L4-L5.    Treatment options discussed with patient and following plan made:    - She will start lumbar HEP I gave her at last visit. This will likely help new thoracic pain as well.  - Continue current medications including prn motrin and robaxin. Reviewed dosing and side effects. Take motrin with food. - Continue on neurotin at night.  - She can follow up with PMR to discuss injections if pain gets worse.  - She will let me know if thoracic pain does not improve.  - Follow up with me in 3 months and prn.   Diana Leach PA-C Neurosurgery

## 2022-11-09 ENCOUNTER — Ambulatory Visit (INDEPENDENT_AMBULATORY_CARE_PROVIDER_SITE_OTHER): Payer: BC Managed Care – PPO | Admitting: Orthopedic Surgery

## 2022-11-09 ENCOUNTER — Encounter: Payer: Self-pay | Admitting: Orthopedic Surgery

## 2022-11-09 DIAGNOSIS — M5416 Radiculopathy, lumbar region: Secondary | ICD-10-CM

## 2022-11-09 DIAGNOSIS — M48061 Spinal stenosis, lumbar region without neurogenic claudication: Secondary | ICD-10-CM

## 2022-11-09 DIAGNOSIS — M4726 Other spondylosis with radiculopathy, lumbar region: Secondary | ICD-10-CM

## 2022-11-09 DIAGNOSIS — M546 Pain in thoracic spine: Secondary | ICD-10-CM

## 2022-11-09 DIAGNOSIS — M47816 Spondylosis without myelopathy or radiculopathy, lumbar region: Secondary | ICD-10-CM

## 2022-11-20 ENCOUNTER — Other Ambulatory Visit: Payer: Self-pay | Admitting: Orthopedic Surgery

## 2022-11-20 DIAGNOSIS — M48061 Spinal stenosis, lumbar region without neurogenic claudication: Secondary | ICD-10-CM

## 2022-11-20 DIAGNOSIS — M5416 Radiculopathy, lumbar region: Secondary | ICD-10-CM

## 2022-11-20 DIAGNOSIS — M47816 Spondylosis without myelopathy or radiculopathy, lumbar region: Secondary | ICD-10-CM

## 2022-11-20 NOTE — Telephone Encounter (Signed)
Left message on patient's voice mall.

## 2022-11-20 NOTE — Telephone Encounter (Signed)
Neurontin refilled and sent to pharmacy.   Please let her know.

## 2022-11-24 ENCOUNTER — Encounter: Payer: Self-pay | Admitting: Internal Medicine

## 2022-11-24 ENCOUNTER — Telehealth (INDEPENDENT_AMBULATORY_CARE_PROVIDER_SITE_OTHER): Payer: No Typology Code available for payment source | Admitting: Internal Medicine

## 2022-11-24 DIAGNOSIS — E119 Type 2 diabetes mellitus without complications: Secondary | ICD-10-CM | POA: Diagnosis not present

## 2022-11-24 DIAGNOSIS — T887XXA Unspecified adverse effect of drug or medicament, initial encounter: Secondary | ICD-10-CM

## 2022-11-24 NOTE — Patient Instructions (Signed)
Basics of Medicine Management Taking your medicines correctly is an important part of managing or preventing medical problems. Make sure you know what disease or condition your medicine is treating, and how and when to take it. If you do not take your medicine correctly, it may not work well and may cause unpleasant side effects, including serious health problems. What should I do when I am taking medicines?  Read all the labels and inserts that come with your medicines. Review the information often and with each refill. Talk with your pharmacist if you get a refill and notice a change in the size, color, or shape of your medicines. Know the potential side effects for each medicine that you take. Try to get all your medicines from the same pharmacy. The pharmacist will have all your information and will understand how your medicines will affect each other (interact). Always carry an updated list of your medicines with you. If there is an emergency, a first responder can quickly see what medicines you are taking. Tell your health care provider about all your medicines, including over-the-counter medicines, vitamins, and herbal or dietary supplements. Your health care provider will make sure that nothing will interact with any of your prescribed medicines. How can I take my medicines safely? Take medicines only as told by your health care provider. Do not take more of your medicine than instructed. Do not take anyone else's medicines. Do not share your medicines with others. Do not stop taking your medicines unless your health care provider tells you to do so. You may need to avoid alcohol or certain foods or liquids when taking certain medicines. Follow your health care provider's instructions. Do not split, cut, crush, or chew your medicines unless your health care provider tells you to do so. Tell your health care provider if you have trouble swallowing your medicines. For liquid medicine, use the  dosing container that was provided. Household spoons are not accurate. How should I organize my medicines?  Know your medicines Know what each of your medicines looks like. This includes size, color, and shape. Tell your health care provider if you are having trouble recognizing all the medicines that you are taking. If you cannot tell your medicines apart because they look similar, keep them in the original bottles. If you cannot read the labels on the bottles, tell your pharmacist to put your medicines in containers with large print. Review your medicines and your schedule with family members, a friend, or a caregiver. Use a pill organizer Use a tool to organize your medicine schedule. Tools include a weekly pillbox, a written chart, a notebook, or a calendar. Your tool should help you remember the following things about each medicine: The name of the medicine. The amount (dose) to take. The schedule. This is the day and time the medicine should be taken. The appearance. This includes color, shape, size, and stamp. How to take your medicines. This includes instructions to take them with food, without food, with fluids, or with other medicines. Create reminders for taking your medicines. Use sticky notes, or use alarms on your watch, mobile device, or phone calendar. You may choose to use a more advanced management system. These systems have storage, alarms, and visual and audio prompts. Some medicines can be taken on an "as-needed" basis. These may include medicines for nausea, constipation, pain, cough and cold, allergies, and anxiety. If you take an as-needed medicine, write down the name and dose, as well as the date and time  that you took it. How should I plan for travel? Take your pillbox, medicines, and organization system with you when traveling. Have your medicines refilled before you travel. This will ensure that you do not run out of your medicines while you are away from  home. Always carry an updated list of your medicines with you. If there is an emergency, a first responder can quickly see what medicines you are taking. Do not pack your medicines in checked luggage in case your luggage is lost or delayed. Keep your medicines in your carry-on bag. If any of your medicines is considered a controlled substance, make sure you bring a letter from your health care provider with you. How should I store and discard my medicines? For safe storage: Store medicines in a cool, dry area away from light, or as directed by your health care provider. Do not store medicines in the bathroom. Heat and humidity will affect them. Do not store your medicines with other chemicals or with medicines for pets or other household members. Keep medicines away from children and pets. Do not leave them on counters or bedside tables. Store them in high cabinets or on high shelves. For safe disposal: Check expiration dates regularly. Do not take expired medicines. Discard medicines that are older than the expiration date. Learn a safe way to dispose of your medicines. You may: Use a local government, hospital, or pharmacy medicine-take-back program. If you cannot return the medicine, check the label or package insert to see if the medicine should be thrown out in the garbage or flushed down the toilet. If you are not sure, ask your health care team. If it is safe to put the medicine in the trash, empty the medicine out of the container. Mix the medicine with cat litter, dirt, coffee grounds, or another unwanted substance. Seal the mixture in a bag or container. Put it in the trash. What should I remember? Tell your health care provider if you: Experience side effects. Have new symptoms. Feel that your medicine is no longer working. Have other concerns about taking your medicines. Review your medicines regularly with your health care provider. Other medicines, diet, medical conditions, weight  changes, and daily habits can all affect how medicines work. Ask if you need to continue taking each medicine, and discuss how well each one is working. Refill your medicines early to avoid running out of them. In case of an accidental overdose, call your local poison control center at [phone] or go to your local emergency department right away. Summary Taking your medicines correctly is an important part of managing or preventing medical problems. You need to make sure that you understand what you are taking a medicine for, as well as how and when you need to take it. Use a tool to organize your medicine schedule. Tools include a weekly pillbox, a written chart, a notebook, or a calendar. In case of an accidental overdose, call your local Poison Control Center at [phone] or go to your local emergency department right away. This information is not intended to replace advice given to you by your health care provider. Make sure you discuss any questions you have with your health care provider. Document Revised: 11/12/2020 Document Reviewed: 11/12/2020 Elsevier Patient Education  2024 [employer].

## 2022-11-24 NOTE — Progress Notes (Signed)
Virtual Visit via Video Note  I connected with Diana [name] on 11/24/22 at  4:00 PM EDT by a video enabled telemedicine application and verified that I am speaking with the correct person using two identifiers.  Location: Patient: Home Provider: Home  Persons participating in this video call: [name], NP and Margo Aye   I discussed the limitations of evaluation and management by telemedicine and the availability of in person appointments. The patient expressed understanding and agreed to proceed.  History of Present Illness:  Patient reports medication side effect.  She was started on Rybelsus 10/26/2022.  She reports the medication is causing severe nausea and constipation.  She stopped this 10 days ago and the nausea has resolved. Her last A1c was 6.7%, 10/2022.  She also has a history of GERD, managed on omeprazole.   Past Medical History:  Diagnosis Date   Allergy    Ear infection    recurrent   Hypertension     Current Outpatient Medications  Medication Sig Dispense Refill   Acetaminophen (TYLENOL ARTHRITIS PAIN PO) Take by mouth.     Ascorbic Acid 125 MG CHEW Chew 1 each by mouth daily.     atorvastatin (LIPITOR) 10 MG tablet Take 1 tablet (10 mg total) by mouth daily. (Patient taking differently: Take 5 mg by mouth daily.) 90 tablet 1   cetirizine (ZYRTEC) 10 MG tablet Take 10 mg by mouth daily as needed.     Cholecalciferol (VITAMIN D3) 25 MCG (1000 UT) CAPS Take 5,000 Units by mouth.      clonazePAM (KLONOPIN) 0.5 MG tablet Take 1 tablet (0.5 mg total) by mouth daily as needed. for anxiety 30 tablet 0   gabapentin (NEURONTIN) 300 MG capsule TAKE 1 CAPSULE (300 MG TOTAL) BY MOUTH AT BEDTIME. 30 capsule 1   hydrocortisone (ANUSOL-HC) 2.5 % rectal cream Place 1 Application rectally 2 (two) times daily. 30 g 0   losartan-hydrochlorothiazide (HYZAAR) 50-12.5 MG tablet TAKE 1 TABLET BY MOUTH DAILY. 90 tablet 1   methocarbamol (ROBAXIN) 500 MG tablet Take 1 tablet (500  mg total) by mouth every 8 (eight) hours as needed for muscle spasms. 30 tablet 0   omeprazole (PRILOSEC) 40 MG capsule Take 1 capsule (40 mg total) by mouth 2 (two) times daily. 180 capsule 1   Semaglutide (RYBELSUS) 3 MG TABS Take 1 tablet (3 mg total) by mouth daily. 90 tablet 1   Turmeric (QC TUMERIC COMPLEX) 500 MG CAPS Take by mouth.     No current facility-administered medications for this visit.    No Known Allergies  Family History  Problem Relation Age of Onset   Hypertension Mother        alive   Arthritis Mother    Thyroid disease Mother    Lung cancer Father        deceased   Heart disease Father    Hypertension Maternal Grandmother     Social History   Socioeconomic History   Marital status: Widowed    Spouse name: Not on file   Number of children: 3   Years of education: Not on file   Highest education level: 12th grade  Occupational History   Occupation: customer service    Employer: [employer]  Tobacco Use   Smoking status: Former    Current packs/day: 0.00    Types: Cigarettes    Quit date: 04/20/1985    Years since quitting: 37.6    Passive exposure: Past   Smokeless tobacco:  Never   Tobacco comments:    remote tobacco use   Vaping Use   Vaping status: Never Used  Substance and Sexual Activity   Alcohol use: No    Alcohol/week: 0.0 standard drinks of alcohol   Drug use: No   Sexual activity: Not Currently  Other Topics Concern   Not on file  Social History Narrative   Not on file   Social Determinants of Health   Financial Resource Strain: Low Risk  (10/26/2022)   Overall Financial Resource Strain (CARDIA)    Difficulty of Paying Living Expenses: Not very hard  Food Insecurity: No Food Insecurity (10/26/2022)   Hunger Vital Sign    Worried About Running Out of Food in the Last Year: Never true    Ran Out of Food in the Last Year: Never true  Transportation Needs: No Transportation Needs (10/26/2022)   PRAPARE - Scientist, research (physical sciences) (Medical): No    Lack of Transportation (Non-Medical): No  Physical Activity: Unknown (10/26/2022)   Exercise Vital Sign    Days of Exercise per Week: 0 days    Minutes of Exercise per Session: Not on file  Stress: No Stress Concern Present (10/26/2022)   [employer] of Occupational Health - Occupational Stress Questionnaire    Feeling of Stress : Not at all  Social Connections: Socially Isolated (10/26/2022)   Social Connection and Isolation Panel [NHANES]    Frequency of Communication with Friends and Family: More than three times a week    Frequency of Social Gatherings with Friends and Family: Once a week    Attends Religious Services: Never    Database administrator or Organizations: No    Attends Engineer, structural: Not on file    Marital Status: Widowed  Intimate Partner Violence: Not on file     Constitutional: Denies fever, malaise, fatigue, headache or abrupt weight changes.  Respiratory: Denies difficulty breathing, shortness of breath, cough or sputum production.   Cardiovascular: Denies chest pain, chest tightness, palpitations or swelling in the hands or feet.  Gastrointestinal: Denies abdominal pain, bloating, constipation, diarrhea or blood in the stool.   No other specific complaints in a complete review of systems (except as listed in HPI above).    Observations/Objective:   Wt Readings from Last 3 Encounters:  10/26/22 239 lb (108.4 kg)  09/16/22 231 lb (104.8 kg)  08/26/22 227 lb (103 kg)    General: Appears her stated age, obese, in NAD. Pulmonary/Chest: Normal effort. No respiratory distress.  Neurological: Alert and oriented.    BMET    Component Value Date/Time   NA 142 10/26/2022 1547   K 3.7 10/26/2022 1547   CL 104 10/26/2022 1547   CO2 30 10/26/2022 1547   GLUCOSE 126 10/26/2022 1547   BUN 16 10/26/2022 1547   CREATININE 0.61 10/26/2022 1547   CALCIUM 9.0 10/26/2022 1547   GFRNONAA >60 07/02/2021 2000   GFRAA  >60 07/27/2017 2153    Lipid Panel     Component Value Date/Time   CHOL 134 10/26/2022 1547   CHOL 189 07/28/2021 1553   TRIG 147 10/26/2022 1547   TRIG 120 07/28/2021 1553   HDL 18 (L) 10/26/2022 1547   CHOLHDL 7.4 (H) 10/26/2022 1547   VLDL 21.0 02/09/2019 1245   LDLCALC 92 10/26/2022 1547    CBC    Component Value Date/Time   WBC 5.7 10/26/2022 1547   RBC 4.24 10/26/2022 1547   HGB 12.9 10/26/2022  1547   HCT 38.8 10/26/2022 1547   PLT 237 10/26/2022 1547   MCV 91.5 10/26/2022 1547   MCH 30.4 10/26/2022 1547   MCHC 33.2 10/26/2022 1547   RDW 13.3 10/26/2022 1547   LYMPHSABS 1.1 07/28/2016 1232   MONOABS 0.4 07/28/2016 1232   EOSABS 0.0 07/28/2016 1232   BASOSABS 0.0 07/28/2016 1232    Hgb A1C Lab Results  Component Value Date   HGBA1C 6.7 (H) 10/26/2022       Assessment and Plan:  Medication side effect, DM 2:  She has already stopped rybelsus and symptom have resolved She would like to stay off medication until her next follow up Encouraged low carb diet and exercise for weight loss  RTC in 5 months for follow-up of chronic conditions  Follow Up Instructions:     I discussed the assessment and treatment plan with the patient. The patient was provided an opportunity to ask questions and all were answered. The patient agreed with the plan and demonstrated an understanding of the instructions.   The patient was advised to call back or seek an in-person evaluation if the symptoms worsen or if the condition fails to improve as anticipated.    [name], NP

## 2022-12-01 ENCOUNTER — Ambulatory Visit: Payer: BC Managed Care – PPO | Admitting: Internal Medicine

## 2022-12-02 ENCOUNTER — Ambulatory Visit (INDEPENDENT_AMBULATORY_CARE_PROVIDER_SITE_OTHER): Payer: Medicaid Other | Admitting: Podiatry

## 2022-12-02 DIAGNOSIS — M7662 Achilles tendinitis, left leg: Secondary | ICD-10-CM

## 2022-12-02 NOTE — Progress Notes (Signed)
Subjective:  Patient ID: Diana [name], female    DOB: 1960-12-04,  MRN: [id number]  Chief Complaint  Patient presents with   Foot Pain    Pt stated that she is doing much better     62 y.o. female presents with the above complaint.  Patient presents for follow-up of left Achilles tendinitis.  Patient states that it is still hurting her.  The right side is doing well.  She does not any pain to the right foot.  She would like to discuss next treatment plan for the left side.   Review of Systems: Negative except as noted in the HPI. Denies N/V/F/Ch.  Past Medical History:  Diagnosis Date   Allergy    Ear infection    recurrent   Hypertension     Current Outpatient Medications:    Acetaminophen (TYLENOL ARTHRITIS PAIN PO), Take by mouth., Disp: , Rfl:    Ascorbic Acid 125 MG CHEW, Chew 1 each by mouth daily., Disp: , Rfl:    atorvastatin (LIPITOR) 10 MG tablet, Take 1 tablet (10 mg total) by mouth daily. (Patient taking differently: Take 5 mg by mouth daily.), Disp: 90 tablet, Rfl: 1   cetirizine (ZYRTEC) 10 MG tablet, Take 10 mg by mouth daily as needed., Disp: , Rfl:    Cholecalciferol (VITAMIN D3) 25 MCG (1000 UT) CAPS, Take 5,000 Units by mouth. , Disp: , Rfl:    clonazePAM (KLONOPIN) 0.5 MG tablet, Take 1 tablet (0.5 mg total) by mouth daily as needed. for anxiety, Disp: 30 tablet, Rfl: 0   gabapentin (NEURONTIN) 300 MG capsule, TAKE 1 CAPSULE (300 MG TOTAL) BY MOUTH AT BEDTIME., Disp: 30 capsule, Rfl: 1   hydrocortisone (ANUSOL-HC) 2.5 % rectal cream, Place 1 Application rectally 2 (two) times daily., Disp: 30 g, Rfl: 0   losartan-hydrochlorothiazide (HYZAAR) 50-12.5 MG tablet, TAKE 1 TABLET BY MOUTH DAILY., Disp: 90 tablet, Rfl: 1   methocarbamol (ROBAXIN) 500 MG tablet, Take 1 tablet (500 mg total) by mouth every 8 (eight) hours as needed for muscle spasms., Disp: 30 tablet, Rfl: 0   omeprazole (PRILOSEC) 40 MG capsule, Take 1 capsule (40 mg total) by mouth 2 (two) times  daily., Disp: 180 capsule, Rfl: 1   Turmeric (QC TUMERIC COMPLEX) 500 MG CAPS, Take by mouth., Disp: , Rfl:   Social History   Tobacco Use  Smoking Status Former   Current packs/day: 0.00   Types: Cigarettes   Quit date: 04/20/1985   Years since quitting: 37.6   Passive exposure: Past  Smokeless Tobacco Never  Tobacco Comments   remote tobacco use     No Known Allergies Objective:  There were no vitals filed for this visit. There is no height or weight on file to calculate BMI. Constitutional Well developed. Well nourished.  Vascular Dorsalis pedis pulses palpable bilaterally. Posterior tibial pulses palpable bilaterally. Capillary refill normal to all digits.  No cyanosis or clubbing noted. Pedal hair growth normal.  Neurologic Normal speech. Oriented to person, place, and time. Epicritic sensation to light touch grossly present bilaterally.  Dermatologic Nails well groomed and normal in appearance. No open wounds. No skin lesions.  Orthopedic: Normal joint ROM without pain or crepitus bilaterally. No visible deformities. Tender to palpation at the calcaneal tuber right. No pain with calcaneal squeeze right. Ankle ROM diminished range of motion right. Silfverskiold Test: positive right.  No further Pain on palpation of left Achilles insertional pain.  No further Pain with dorsiflexion of the ankle joint no  pain with plantarflexion of the ankle joint.  Clinically able to appreciate Haglund's deformity positive Silfverskiold test noted with gastrocnemius equinus to the left side as well   Radiographs: None Assessment:   No diagnosis found.   Plan:  Patient was evaluated and treated and all questions answered.  Left Achilles tendonitis -Clinically healed and officially discharged from my care if any foot and ankle issues or in the future she will come back and see me.  I discussed prevention technique I discussed shoe gear modification.  She states  understanding.   Plantar Fasciitis, right -Clinically healed and is doing well with the steroid injection and bracing.  At this time patient does not have any pain in the foot I discussed shoe gear modification and possible orthotics management.  No follow-ups on file.

## 2022-12-03 ENCOUNTER — Other Ambulatory Visit: Payer: Self-pay | Admitting: Internal Medicine

## 2022-12-04 ENCOUNTER — Other Ambulatory Visit: Payer: Self-pay | Admitting: Internal Medicine

## 2022-12-04 DIAGNOSIS — K219 Gastro-esophageal reflux disease without esophagitis: Secondary | ICD-10-CM

## 2022-12-04 MED ORDER — HYDROCORTISONE (PERIANAL) 2.5 % EX CREA: 1.0000 | TOPICAL_CREAM | Freq: Two times a day (BID) | CUTANEOUS | 0 refills | Status: DC

## 2022-12-04 MED ORDER — CLONAZEPAM 0.5 MG PO TABS
0.5000 mg | ORAL_TABLET | Freq: Every day | ORAL | 0 refills | Status: DC | PRN
Start: 2022-12-04 — End: 2023-03-22

## 2022-12-04 MED ORDER — METHOCARBAMOL 500 MG PO TABS: 500.0000 mg | ORAL_TABLET | Freq: Three times a day (TID) | ORAL | 0 refills | Status: DC | PRN

## 2022-12-04 NOTE — Telephone Encounter (Signed)
Requested Prescriptions  Pending Prescriptions Disp Refills   losartan-hydrochlorothiazide (HYZAAR) 50-12.5 MG tablet [Pharmacy Med Name: Losartan Potassium-HCTZ 50-12.5 MG Oral Tablet] 90 tablet 0    Sig: Take 1 tablet by mouth once daily     Cardiovascular: ARB + Diuretic Combos Passed - 12/03/2022  8:32 AM      Passed - K in normal range and within 180 days    Potassium  Date Value Ref Range Status  10/26/2022 3.7 3.5 - 5.3 mmol/L Final         Passed - Na in normal range and within 180 days    Sodium  Date Value Ref Range Status  10/26/2022 142 135 - 146 mmol/L Final         Passed - Cr in normal range and within 180 days    Creat  Date Value Ref Range Status  10/26/2022 0.61 0.50 - 1.03 mg/dL Final   Creatinine, Urine  Date Value Ref Range Status  10/26/2022 113 20 - 275 mg/dL Final         Passed - eGFR is 10 or above and within 180 days    GFR calc Af Amer  Date Value Ref Range Status  07/27/2017 >60 >60 mL/min Final    Comment:    (NOTE) The eGFR has been calculated using the CKD EPI equation. This calculation has not been validated in all clinical situations. eGFR's persistently <60 mL/min signify possible Chronic Kidney Disease.    GFR, Estimated  Date Value Ref Range Status  07/02/2021 >60 >60 mL/min Final    Comment:    (NOTE) Calculated using the CKD-EPI Creatinine Equation (2021)    GFR  Date Value Ref Range Status  09/19/2019 92.37 >60.00 mL/min Final   eGFR  Date Value Ref Range Status  10/26/2022 103 > OR = 60 mL/min/1.15m2 Final         Passed - Patient is not pregnant      Passed - Last BP in normal range    BP Readings from Last 1 Encounters:  10/26/22 132/78         Passed - Valid encounter within last 6 months    Recent Outpatient Visits           1 week ago Medication side effect   [place] [facility], [place] W, NP   1 month ago Type 2 diabetes mellitus without complication, without long-term  current use of insulin [facility])   [place] [facility] [place], Salvadore Oxford, NP   3 months ago Vaginal burning   [place] [facility] [place], [place], NP   3 months ago Chronic right-sided low back pain with right-sided sciatica   [place] [facility] [place], Salvadore Oxford, NP   3 months ago Sciatica of right side   [place] [facility], Oswaldo Conroy, PA-C       Future Appointments             In 2 months Vanga, Loel Dubonnet, MD [facility] [place] [facility]   In 5 months Baity, Salvadore Oxford, NP [place] [facility], [facility]

## 2022-12-07 NOTE — Telephone Encounter (Signed)
Requested medication (s) are due for refill today:no  Requested medication (s) are on the active medication list: yes  Last refill:  12/04/22  Future visit scheduled: yes  Notes to clinic:  NT not delegated to refuse, refill or transfer meds if requested   Requested Prescriptions  Pending Prescriptions Disp Refills   methocarbamol (ROBAXIN) 500 MG tablet [Pharmacy Med Name: METHOCARBAMOL 500 MG TABS 500 Tablet] 30 tablet 0    Sig: Take 1 tablet (500 mg total) by mouth every 8 (eight) hours as needed for muscle spasms.     Not Delegated - Analgesics:  Muscle Relaxants Failed - 12/04/2022  1:06 PM      Failed - This refill cannot be delegated      Passed - Valid encounter within last 6 months    Recent Outpatient Visits           1 week ago Medication side effect   [place] [facility] [place], [place], NP   1 month ago Type 2 diabetes mellitus without complication, without long-term current use of insulin [facility])   [place] [facility] [place], Salvadore Oxford, NP   3 months ago Vaginal burning   [place] [facility] [place], [place], NP   3 months ago Chronic right-sided low back pain with right-sided sciatica   [place] [facility] [place], Salvadore Oxford, NP   3 months ago Sciatica of right side   [place] [facility], Oswaldo Conroy, PA-C       Future Appointments             In 1 month Vanga, Loel Dubonnet, MD [place] [facility] [place] [facility] at [facility]   In 4 months Baity, Salvadore Oxford, NP [place] [facility], [facility]             hydrocortisone (ANUSOL-HC) 2.5 % rectal cream [Pharmacy Med Name: HYDROCORTISONE 2.5% CREAM 2.5 Cream] 30 g 0    Sig: PLACE 1 APPLICATION RECTALLY 2 TIMES DAILY.     Not Delegated - Over the Counter: OTC 2 Failed - 12/04/2022  1:06 PM      Failed - This refill cannot be delegated      Passed - Valid encounter within last 12 months    Recent  Outpatient Visits           1 week ago Medication side effect   [place] [facility] [place], [place], NP   1 month ago Type 2 diabetes mellitus without complication, without long-term current use of insulin [facility])   [place] [facility] [place], Salvadore Oxford, NP   3 months ago Vaginal burning   [place] [facility] [place], [place] W, NP   3 months ago Chronic right-sided low back pain with right-sided sciatica   [place] [facility] [place], Salvadore Oxford, NP   3 months ago Sciatica of right side   [place] [facility], Oswaldo Conroy, PA-C       Future Appointments             In 1 month Vanga, Loel Dubonnet, MD [facility] [place] [facility]   In 4 months Baity, Salvadore Oxford, NP [place] [facility], [facility]             clonazePAM (KLONOPIN) 0.5 MG tablet [Pharmacy Med Name: CLONAZEPAM 0.5 MG TAB  0.5 Tablet] 30 tablet 0    Sig: Take 1 tablet (0.5 mg total) by mouth daily as needed. for anxiety     Not Delegated - Psychiatry: Anxiolytics/Hypnotics 2 Failed - 12/04/2022  1:06 PM      Failed - This refill cannot be delegated      Failed - Urine Drug Screen completed in last 360 days      Passed - Patient is not pregnant      Passed - Valid encounter within last 6 months    Recent Outpatient Visits           1 week ago Medication side effect   [place] [facility] [place], [place], NP   1 month ago Type 2 diabetes mellitus without complication, without long-term current use of insulin [facility])   [place] [facility] [place], Salvadore Oxford, NP   3 months ago Vaginal burning   [place] [facility] [place], [place] W, NP   3 months ago Chronic right-sided low back pain with right-sided sciatica   [place] [facility] [employer], Salvadore Oxford, NP   3 months ago Sciatica of right side   [place] [facility], Oswaldo Conroy, PA-C       Future Appointments             In 1 month Vanga, Loel Dubonnet, MD [place] [facility] [place] [facility] at [facility]   In 4 months Baity, Salvadore Oxford, NP [place] [facility], [facility]

## 2022-12-08 ENCOUNTER — Ambulatory Visit: Payer: Self-pay | Admitting: *Deleted

## 2022-12-08 NOTE — Telephone Encounter (Signed)
Summary: rx req / lower back discomfort   Per agent: "The patient has called to request a prescription for Tramadol  The patient shares that they began experience the discomfort in their lower back yesterday when doing laundry 12/07/22  Please contact further when possible"          Chief Complaint: Lower Back Pain Symptoms: Lower back pain, right sided. Radiates to right buttocks. Constant "Ache, then throbbing at times." Frequency: Yesterday Pertinent Negatives: Patient denies dysuria, numbness Disposition: [] ED /[] Urgent Care (no appt availability in office) / [] Appointment(In office/virtual)/ []  [place] [facility]/ [] Home Care/ [] Refused Recommended Disposition /[]  Mobile Bus/ [x]  Follow-up with PCP Additional Notes: Pt reports H/O "This same pain, I usually get Tramadol." States had 1 tab left from prescription of 08/17/22 and took prior to call. Prescription has expired, not on current med profile. Advised usually requires appt. States "This is the same pain I always have, just bent over doing laundry and it came back." Assured pt NT would route to practice for PCPs review and final disposition. Please advise.   Reason for Disposition  [1] Pain radiates into the thigh or further down the leg AND [2] one leg  Answer Assessment - Initial Assessment Questions 1. ONSET: "When did the pain begin?"      Yesterday 2. LOCATION: "Where does it hurt?" (upper, mid or lower back)     Lower back  3. SEVERITY: "How bad is the pain?"  (e.g., Scale 1-10; mild, moderate, or severe)   - MILD (1-3): Doesn't interfere with normal activities.    - MODERATE (4-7): Interferes with normal activities or awakens from sleep.    - SEVERE (8-10): Excruciating pain, unable to do any normal activities.     Moderate aches 4. PATTERN: "Is the pain constant?" (e.g., yes, no; constant, intermittent)      Constant ache, sharp at times, down to right buttocks 5. RADIATION: "Does the pain shoot  into your legs or somewhere else?"     Right butt cheek 6. CAUSE:  "What do you think is causing the back pain?"      "I have lots of back pain." 7. BACK OVERUSE:  "Any recent lifting of heavy objects, strenuous work or exercise?"     No 8. MEDICINES: "What have you taken so far for the pain?" (e.g., nothing, acetaminophen, NSAIDS)     One tramadol left, took 30 minutes ago 9. NEUROLOGIC SYMPTOMS: "Do you have any weakness, numbness, or problems with bowel/bladder control?"     No 10. OTHER SYMPTOMS: "Do you have any other symptoms?" (e.g., fever, abdomen pain, burning with urination, blood in urine)       No  Protocols used: Back Pain-A-AH

## 2022-12-09 MED ORDER — TRAMADOL HCL 50 MG PO TABS
50.0000 mg | ORAL_TABLET | Freq: Three times a day (TID) | ORAL | 0 refills | Status: AC | PRN
Start: 2022-12-09 — End: 2022-12-14

## 2022-12-09 NOTE — Telephone Encounter (Signed)
Tramadol refilled.

## 2022-12-09 NOTE — Addendum Note (Signed)
Addended by: [name] on: 12/09/2022 08:20 AM   Modules accepted: Orders

## 2022-12-13 ENCOUNTER — Other Ambulatory Visit: Payer: Self-pay | Admitting: Family Medicine

## 2022-12-13 DIAGNOSIS — E7800 Pure hypercholesterolemia, unspecified: Secondary | ICD-10-CM

## 2022-12-15 NOTE — Telephone Encounter (Signed)
Requested Prescriptions  Pending Prescriptions Disp Refills   atorvastatin (LIPITOR) 10 MG tablet [Pharmacy Med Name: Atorvastatin Calcium 10 MG Oral Tablet] 90 tablet 1    Sig: Take 1 tablet by mouth once daily     Cardiovascular:  Antilipid - Statins Failed - 12/13/2022 11:24 AM      Failed - Lipid Panel in normal range within the last 12 months    Cholesterol, Total  Date Value Ref Range Status  07/28/2021 189 100 - 199 mg/dL Final   Cholesterol  Date Value Ref Range Status  10/26/2022 134 <200 mg/dL Final   LDL Cholesterol (Calc)  Date Value Ref Range Status  10/26/2022 92 mg/dL (calc) Final    Comment:    Reference range: <100 . Desirable range <100 mg/dL for primary prevention;   <70 mg/dL for patients with CHD or diabetic patients  with > or = 2 CHD risk factors. Marland Kitchen LDL-C is now calculated using the Martin-Hopkins  calculation, which is a validated novel method providing  better accuracy than the Friedewald equation in the  estimation of LDL-C.  Horald Pollen et al. Lenox Ahr. 8119;147(82): 2061-2068  (http://education.[employer].com/faq/FAQ164)    HDL  Date Value Ref Range Status  10/26/2022 18 (L) > OR = 50 mg/dL Final   Triglycerides  Date Value Ref Range Status  10/26/2022 147 <150 mg/dL Final  95/62/1308 657 0 - 149 mg/dL Final         Passed - Patient is not pregnant      Passed - Valid encounter within last 12 months    Recent Outpatient Visits           3 weeks ago Medication side effect   [place] [facility] [place], [place], NP   1 month ago Type 2 diabetes mellitus without complication, without long-term current use of insulin [facility])   [place] [facility] [place], Salvadore Oxford, NP   3 months ago Vaginal burning   [place] [facility] [place], [place], NP   4 months ago Chronic right-sided low back pain with right-sided sciatica   [place] [facility] [place], Salvadore Oxford, NP   4  months ago Sciatica of right side   [place] [facility], Oswaldo Conroy, PA-C       Future Appointments             In 1 month Vanga, Loel Dubonnet, MD [facility] [place] [facility] at [facility]   In 4 months Baity, Salvadore Oxford, NP [place] [facility], [facility]

## 2022-12-20 ENCOUNTER — Other Ambulatory Visit: Payer: Self-pay | Admitting: Family Medicine

## 2022-12-20 DIAGNOSIS — E7800 Pure hypercholesterolemia, unspecified: Secondary | ICD-10-CM

## 2022-12-29 ENCOUNTER — Ambulatory Visit (INDEPENDENT_AMBULATORY_CARE_PROVIDER_SITE_OTHER): Payer: BC Managed Care – PPO | Admitting: Gastroenterology

## 2022-12-29 ENCOUNTER — Encounter: Payer: Self-pay | Admitting: Gastroenterology

## 2022-12-29 VITALS — BP 132/82 | HR 76 | Temp 97.6°F | Ht 67.0 in | Wt 238.4 lb

## 2022-12-29 DIAGNOSIS — B181 Chronic viral hepatitis B without delta-agent: Secondary | ICD-10-CM | POA: Diagnosis not present

## 2022-12-29 DIAGNOSIS — K760 Fatty (change of) liver, not elsewhere classified: Secondary | ICD-10-CM | POA: Diagnosis not present

## 2022-12-29 NOTE — Progress Notes (Unsigned)
[name], MD [address]  [address]  [place], [place] [address]  Main: [phone]  Fax: [phone]    Gastroenterology Consultation  Referring Provider:     [name], NP Primary Care Physician:  [name], NP Primary Gastroenterologist:  Dr. [name] Reason for Consultation: Painless rectal bleeding       HPI:   Diana [name] is a 62 y.o. female referred by Dr. Sampson Si, Salvadore Oxford, NP  for consultation & management of chronic hepatitis B infection.Ms. Harberts has history of hepatitis B infection, treated with entecavir with no detection of active infection for last 2 years, E antigen negative, E antibody positive.   And, viral load is undetectable most recently in 11/2019. She does have fatty liver. She denies any GI symptoms today. LFTs are normal except for indirect hyperbilirubinemia.  Patient did not show for her screening colonoscopy last year.  She reports intermittent constipation.  She does have flareup of hemorrhoids, mostly swelling and discomfort.  She has been gaining weight  Follow-up visit 07/28/21 Patient made a follow-up appointment when she had flareup of GERD symptoms about 3 months ago.  Her PCP increased omeprazole 40 mg to twice daily and apparently patient reports that it did not help.  Currently, her GERD symptoms have subsided And she is on omeprazole 40 mg once a day.  She continues to gain weight, does admit to drinking sweet tea daily.  Patient did not undergo ultrasound liver as she was not able to afford co-pay.  She did not undergo colonoscopy as well  She is also overdue for colon cancer screening  Follow-up visit 10/28/2021 Patient made an appointment to discuss about 2 weeks history of painless rectal bleeding.  Patient reports that she has been passing bright red blood per rectum, dripping into the toilet bowl as well as on wiping.  Her bowel movements tend to be on the harder side associated with some straining.  She does  have bowel movement on a daily basis.  Patient is past due for her colonoscopy.  She would like to know if this is secondary to hemorrhoids.  Patient has not tried any over-the-counter hemorrhoidal remedies.  NSAIDs: she is no longer taking NSAIDs  Antiplts/Anticoagulants/Anti thrombotics: none  GI Procedures: none She denies family history of colon cancer  Ultrasound-guided liver biopsy 07/27/2017 DIAGNOSIS:  A. LIVER; ULTRASOUND-GUIDED CORE BIOPSY:  - HEPATITIC PROCESS WITH MILD TO MODERATE PORTAL AND LOBULAR  INFLAMMATION, CONSISTENT WITH VIRAL HEPATITIS B, SEE COMMENT.  - NEGATIVE FOR FIBROSIS.  - NEGATIVE FOR STEATOSIS.   Past Medical History:  Diagnosis Date   Allergy    Ear infection    recurrent   Hypertension     Past Surgical History:  Procedure Laterality Date   ABDOMINAL HYSTERECTOMY  2019   partial   COLONOSCOPY WITH PROPOFOL N/A 11/10/2021   Procedure: COLONOSCOPY WITH PROPOFOL;  Surgeon: Toney Reil, MD;  Location: [facility] ENDOSCOPY;  Service: Gastroenterology;  Laterality: N/A;   TONSILLECTOMY     TUBAL LIGATION     bilateral     Current Outpatient Medications:    Acetaminophen (TYLENOL ARTHRITIS PAIN PO), Take by mouth., Disp: , Rfl:    Ascorbic Acid 125 MG CHEW, Chew 1 each by mouth daily., Disp: , Rfl:    atorvastatin (LIPITOR) 10 MG tablet, Take 1 tablet by mouth once daily, Disp: 90 tablet, Rfl: 1   cetirizine (ZYRTEC) 10 MG tablet, Take 10 mg by mouth  daily as needed., Disp: , Rfl:    Cholecalciferol (VITAMIN D3) 25 MCG (1000 UT) CAPS, Take 5,000 Units by mouth. , Disp: , Rfl:    clonazePAM (KLONOPIN) 0.5 MG tablet, Take 1 tablet (0.5 mg total) by mouth daily as needed. for anxiety, Disp: 30 tablet, Rfl: 0   gabapentin (NEURONTIN) 300 MG capsule, TAKE 1 CAPSULE (300 MG TOTAL) BY MOUTH AT BEDTIME., Disp: 30 capsule, Rfl: 1   hydrocortisone (ANUSOL-HC) 2.5 % rectal cream, Place 1 Application rectally 2 (two) times daily., Disp: 30 g, Rfl: 0    losartan-hydrochlorothiazide (HYZAAR) 50-12.5 MG tablet, Take 1 tablet by mouth once daily, Disp: 90 tablet, Rfl: 0   methocarbamol (ROBAXIN) 500 MG tablet, Take 1 tablet (500 mg total) by mouth every 8 (eight) hours as needed for muscle spasms., Disp: 30 tablet, Rfl: 0   omeprazole (PRILOSEC) 40 MG capsule, Take 1 capsule (40 mg total) by mouth 2 (two) times daily., Disp: 180 capsule, Rfl: 1   Turmeric (QC TUMERIC COMPLEX) 500 MG CAPS, Take by mouth., Disp: , Rfl:    Family History  Problem Relation Age of Onset   Hypertension Mother        alive   Arthritis Mother    Thyroid disease Mother    Lung cancer Father        deceased   Heart disease Father    Hypertension Maternal Grandmother      Social History   Tobacco Use   Smoking status: Former    Current packs/day: 0.00    Types: Cigarettes    Quit date: 04/20/1985    Years since quitting: 37.7    Passive exposure: Past   Smokeless tobacco: Never   Tobacco comments:    remote tobacco use   Vaping Use   Vaping status: Never Used  Substance Use Topics   Alcohol use: No    Alcohol/week: 0.0 standard drinks of alcohol   Drug use: No    Allergies as of 12/29/2022   (No Known Allergies)    Review of Systems:    All systems reviewed and negative except where noted in HPI.   Physical Exam:  BP 132/82 (BP Location: Left Arm, Patient Position: Sitting, Cuff Size: Large)   Pulse 76   Temp 97.6 F (36.4 C) (Oral)   Ht 5\' 7"  (1.702 m)   Wt 238 lb 6 oz (108.1 kg)   LMP 01/20/2016   BMI 37.33 kg/m  Patient's last menstrual period was 01/20/2016.  General:   Alert,  Well-developed, well-nourished, pleasant and cooperative in NAD Head:  Normocephalic and atraumatic. Eyes:  Sclera clear, no icterus.   Conjunctiva pink. Ears:  Normal auditory acuity. Nose:  No deformity, discharge, or lesions. Mouth:  No deformity or lesions,oropharynx pink & moist. Neck:  Supple; no masses or thyromegaly. Lungs:  Respirations even and  unlabored.  Clear throughout to auscultation.   No wheezes, crackles, or rhonchi. No acute distress. Heart:  Regular rate and rhythm; no murmurs, clicks, rubs, or gallops. Abdomen:  Normal bowel sounds. Soft, obese, non-tender and non-distended without masses, hepatosplenomegaly or hernias noted.  No guarding or rebound tenderness.   Rectal: Not performed Msk:  Symmetrical without gross deformities. Good, equal movement & strength bilaterally. Pulses:  Normal pulses noted. Extremities:  No clubbing or edema.  No cyanosis. Neurologic:  Alert and oriented x3;  grossly normal neurologically. Skin:  Intact without significant lesions or rashes. No jaundice. Psych:  Alert and cooperative. Normal mood and affect.  Imaging Studies: Last ultrasound abdomen in 05/2015 which was unremarkable  Ultrasound abdomen 06/2017 unremarkable with no evidence of cirrhosis and no portal hypertension  Assessment and Plan:   SHAUNIE BEATTY is a 62 y.o. female with obesity, hypertension, GERD is here for follow-up of chronic hepatitis B infection. Her hepatitis D antigen is negative. She does not have clinical signs or symptoms to suggest chronic liver disease. There is no evidence of portal hypertension. Secondary liver disease workup revealed weakly positive anti-smooth muscle antibodies. She does not have hep C. Status post liver biopsy.  Patient did not tolerate viread, developed stomach upset and myalgias.  Therefore, treated with entecavir which was stopped in 07/2019.    Painless rectal bleeding Strongly recommended patient to undergo colonoscopy to evaluate for alternative etiology.  I have discussed with her regarding outpatient hemorrhoid ligation if colonoscopy is otherwise unremarkable Patient is agreeable to undergo diagnostic colonoscopy  I have discussed alternative options, risks & benefits,  which include, but are not limited to, bleeding, infection, perforation,respiratory complication & drug  reaction.  The patient agrees with this plan & written consent will be obtained.     Chronic hepatitis B infection, currently seroconverted, E antigen negative, E antibody positive and HBV DNA undetected as of 1423 - Okay to hold off on antiviral therapy at this time - Recheck LFTs, HBV DNA every 6 months - Hepatitis B e antigen, hepatitis B e antibody every 6 months - HBsAg annually - to avoid alcohol use, limit intake of NSAIDs and Tylenol use for arthralgias - Elita Boone FibroSure revealed F3 fibrosis, mild to moderate steatosis.  We will discuss more regarding fibrosis during next visit   Follow up based on the colonoscopy results   [name], MD

## 2022-12-29 NOTE — Patient Instructions (Addendum)
Your RUQ ultrasound is scheduled for 12/31/2022 arrive at 9:15am for a 9:30am scan. Nothing to eat or drink after midnight. If you need to reschedule please call [phone] option 3 and then option 2.

## 2022-12-30 ENCOUNTER — Telehealth: Payer: Self-pay

## 2022-12-30 DIAGNOSIS — B181 Chronic viral hepatitis B without delta-agent: Secondary | ICD-10-CM

## 2022-12-30 NOTE — Telephone Encounter (Signed)
That lab has to be frozen so the patient will have to come and get her lab drawn again. They are going to reach out the department that run the HSV DNA test to see if they can cancel that lab. She reached out and they did not answer. She states she will send them a urgent email to cancel the test though. Reach out to the patient and she going to come to our office in [place] to have the lab work done today or tomorrow.

## 2022-12-30 NOTE — Telephone Encounter (Signed)
-----   Message from [facility] sent at 12/30/2022  7:49 AM EDT ----- Regarding: wrong lab order Morrie Sheldon  Please cancel HSV DNA which is herpes simplex virus We need to order HBV DNA, please order this and patient might have to come give another sample.  Please check with the lab ASAP  RV

## 2022-12-31 ENCOUNTER — Ambulatory Visit
Admission: RE | Admit: 2022-12-31 | Discharge: 2022-12-31 | Disposition: A | Discharge status: Home / Self Care | Payer: No Typology Code available for payment source | Source: Ambulatory Visit | Attending: Gastroenterology | Admitting: Gastroenterology

## 2022-12-31 DIAGNOSIS — K760 Fatty (change of) liver, not elsewhere classified: Secondary | ICD-10-CM | POA: Insufficient documentation

## 2022-12-31 LAB — NASH FIBROSURE(R) PLUS
ALPHA 2-MACROGLOBULINS, QN: 372 mg/dL — ABNORMAL HIGH (ref 110–276)
ALT (SGPT) P5P: 16 IU/L (ref 0–40)
AST (SGOT) P5P: 19 IU/L (ref 0–40)
Apolipoprotein A-1: 65 mg/dL — ABNORMAL LOW (ref 116–209)
Bilirubin, Total: 1.2 mg/dL (ref 0.0–1.2)
Cholesterol, Total: 151 mg/dL (ref 100–199)
Fibrosis Score: 0.76 — ABNORMAL HIGH (ref 0.00–0.21)
GGT: 19 IU/L (ref 0–60)
Glucose: 150 mg/dL — ABNORMAL HIGH (ref 70–99)
Haptoglobin: 191 mg/dL (ref 33–346)
NASH Score: 0.51 — ABNORMAL HIGH (ref 0.00–0.25)
Steatosis Score: 0.61 — ABNORMAL HIGH (ref 0.00–0.40)
Triglycerides: 109 mg/dL (ref 0–149)

## 2022-12-31 LAB — HSV DNA BY PCR (REFERENCE LAB)

## 2022-12-31 LAB — HEPATITIS B SURFACE ANTIGEN: Hepatitis B Surface Ag: NEGATIVE

## 2022-12-31 LAB — HEPATITIS B SURFACE ANTIBODY,QUALITATIVE: Hep B Surface Ab, Qual: REACTIVE

## 2022-12-31 LAB — HEPATITIS B CORE ANTIBODY, TOTAL: Hep B Core Total Ab: POSITIVE — AB

## 2023-01-01 ENCOUNTER — Encounter: Payer: Self-pay | Admitting: Gastroenterology

## 2023-01-04 ENCOUNTER — Telehealth: Payer: Self-pay

## 2023-01-04 ENCOUNTER — Telehealth: Payer: Self-pay | Admitting: Gastroenterology

## 2023-01-04 ENCOUNTER — Ambulatory Visit: Payer: BC Managed Care – PPO | Admitting: Internal Medicine

## 2023-01-04 NOTE — Telephone Encounter (Signed)
Patient states she has a lot of questions about how she is now in stage 4 advanced fibrosis. She states she goggled and it says if you are In stage 4 then you have cirrhosis also. She states how does she not qualify for this medication if she has really bad fibrosis. Asked patient if she has tried to change her diet and exercise since her office visit on 12/29/2022 and she states no and she just received this message today on mychart that you had sent her about the results. She states she would really like you to call her to go over the results in more detail. Informed her that you were in procedures all morning and then had clinic all afternoon so did not know if you would just give me more information to tell her.

## 2023-01-04 NOTE — Telephone Encounter (Signed)
-----   Message from [facility] sent at 01/01/2023 12:57 PM EDT ----- Morrie Sheldon  Please inform patient that based on the blood test to check stiffness of the liver, it is stage IV, advanced fibrosis.  She does not have cirrhosis of liver yet.  She also has moderate to severe fat deposition. She would have qualified for medication to treat fatty liver disease if she had stage II or III stiffness Therefore, my strong recommendation is to follow a very strict healthy lifestyle, weight loss by following healthy diet.  She will have to lose 10% of her existing weight.  We can recheck the score in 6 months and if it improves to stage II or III, she would become eligible for the medication   I also recommend her to follow-up with her PCP to discuss about medications for weight loss  Rohini Vanga

## 2023-01-04 NOTE — Telephone Encounter (Signed)
PT requesting call back to discuss some health concerns she would not go into detail

## 2023-01-05 ENCOUNTER — Encounter: Payer: Self-pay | Admitting: Internal Medicine

## 2023-01-05 ENCOUNTER — Ambulatory Visit (INDEPENDENT_AMBULATORY_CARE_PROVIDER_SITE_OTHER): Payer: No Typology Code available for payment source | Admitting: Internal Medicine

## 2023-01-05 VITALS — BP 136/76 | HR 74 | Temp 95.3°F | Wt 236.0 lb

## 2023-01-05 DIAGNOSIS — M5441 Lumbago with sciatica, right side: Secondary | ICD-10-CM

## 2023-01-05 DIAGNOSIS — K760 Fatty (change of) liver, not elsewhere classified: Secondary | ICD-10-CM

## 2023-01-05 DIAGNOSIS — K7400 Hepatic fibrosis, unspecified: Secondary | ICD-10-CM

## 2023-01-05 DIAGNOSIS — Z6836 Body mass index (BMI) 36.0-36.9, adult: Secondary | ICD-10-CM

## 2023-01-05 DIAGNOSIS — G8929 Other chronic pain: Secondary | ICD-10-CM | POA: Diagnosis not present

## 2023-01-05 DIAGNOSIS — E119 Type 2 diabetes mellitus without complications: Secondary | ICD-10-CM

## 2023-01-05 NOTE — Patient Instructions (Signed)
Nonalcoholic Fatty Liver Disease Diet, Adult Nonalcoholic fatty liver disease is a condition that causes fat to build up in and around the liver. The disease makes it harder for the liver to work the way that it should. Eating a healthy diet of fruits, vegetables, whole grains, lean proteins, and limiting added sugar and fats can help to keep nonalcoholic fatty liver disease under control. It can also help to prevent or improve conditions that are related to the disease, such as heart disease, diabetes, high blood pressure, obesity, and high cholesterol. Along with regular exercise, this diet: Promotes weight loss. Helps to control blood sugar levels. Helps to improve the way that the body uses insulin. What are tips for following this plan? Reading food labels Always check food labels for: The amount of saturated fat in a food. You should limit how much saturated fat you eat. Saturated fat is found in foods that come from animals, including meat and dairy products such as butter, cheese, and whole milk. The amount of fiber in a food. You should choose high-fiber foods such as fruits, vegetables, and whole grains. Try to get 25-30 grams (g) of fiber a day. Added sugar. Avoid foods with a high amount of added sugar and high fructose corn syrup. Avoid sweetened soft drinks, sweetened tea, lemonade, sports drinks, and juices that are not 100% juice. Aim for foods with less than 5 grams of added sugar. Every 4 grams of added sugar is 1 teaspoon (tsp) of sugar per serving. Cooking When cooking, use heart-healthy oils that are high in monounsaturated fats. These include olive oil, canola oil, and avocado oil. Limit frying or deep-frying foods. Cook foods using healthy methods such as baking, boiling, steaming, and grilling instead. Meal planning You may want to keep track of how many calories you eat and drink. Eating the right amount of calories will help you achieve a healthy weight. Meeting with a  dietitian can help you get started. Limit how often you eat takeout and fast food. These foods are usually very high in fat, salt, and sugar. Use the glycemic index (GI) to plan your meals. The index tells you how quickly a food will raise your blood sugar. Choose low-GI foods. Low-GI foods have a GI less than 55. These foods take a longer time to raise blood sugar. A dietitian can help you pick foods that are lower on the GI scale. Try to include some meals each week that replace meat with beans or legumes. Add fish 2-3 times a week, especially heart healthy oily fishes like salmon, sardines, trout, tuna, or mackerel. Lifestyle You may want to follow a Mediterranean diet. This diet includes a lot of vegetables, lean meats or fish, nuts and seeds, whole grains, fruits, and healthy oils and fats. What foods can I eat?  Fruits Apples. Bananas. Pears. Grapes. Papaya. Plums. Kiwi. Grapefruit. Cherries. Strawberries. Vegetables Lettuce. Spinach. Peas. Beets. Cauliflower. Cabbage. Broccoli. Carrots. Tomatoes. Squash. Eggplant. Herbs. Peppers. Onions. Cucumbers. Brussels sprouts. Yams and sweet potatoes. Grains Whole wheat or whole-grain foods, including breads, crackers, cereals, and pasta. Stone-ground whole wheat. Unsweetened oatmeal. Bulgur. Barley. Quinoa. Brown or wild rice. Corn or whole wheat flour tortillas. Meats and other proteins Lean meats. Poultry. Tofu. Seafood and shellfish. Beans. Lentils. Dairy Low-fat or fat-free dairy products, such as yogurt, cottage cheese, or cheese. Beverages Water. Sugar-free drinks. Tea. Coffee. Low-fat or skim milk. Milk alternatives, such as unsweetened soy, oat, or almond milk. Real fruit juice. Fats and oils Avocado. Canola or  olive oil. Nuts and nut butters. Seeds. Seasonings and condiments Mustard. Relish. Low-fat, low-sugar ketchup and barbecue sauce. Low-fat or fat-free mayonnaise. Sweets and desserts Sugar-free sweets. The items listed above may  not be all the foods and drinks you can have. Talk to a dietitian to learn more. What foods should I limit or avoid? Grains White rice. Pasta. Breads. Meats and other proteins Limit red meat to 1-2 times a week. Dairy [employer]. Fats and oils Palm oil and coconut oil. Fried foods. Other foods Processed foods. Foods that contain a lot of salt (sodium) or added sugar. Sweets and desserts Sweets that contain sugar. Bakery items such as cookies, cakes, and other pastries. Beverages Sweetened drinks, such as sweet tea, milkshakes, iced sweet drinks, and sodas. Alcohol. The items listed above may not be all the foods and drinks you should avoid. Talk to a dietitian to learn more. Where to find more information The [employer] of Diabetes and Digestive and Kidney Diseases: [employer]Sync.si This information is not intended to replace advice given to you by your health care provider. Make sure you discuss any questions you have with your health care provider. Document Revised: 01/19/2022 Document Reviewed: 01/19/2022 Elsevier Patient Education  2024 [employer].

## 2023-01-05 NOTE — Progress Notes (Signed)
Subjective:    Patient ID: Diana [name], female    DOB: [birth year], 62 y.o.   MRN: [id number]  HPI  Discussed the use of AI scribe software for clinical note transcription with the patient, who gave verbal consent to proceed.  History of Present Illness   The patient, with a history of fatty liver disease, chronic hep B diagnosed five years ago, presents with concerns about the progression of the disease. They report that they were recently informed by their gastroenterologist that their condition has progressed to stage 4 fibrosis without cirrhosis. The patient expresses confusion and concern about the severity of their condition, as they were not aware of the implications of their diagnosis when it was initially made. They also express frustration about the lack of clarity regarding their disease stage and the potential for treatment.  The patient has struggled with weight gain, which has likely contributed to the progression of their liver disease. They have tried various weight loss medications (rybelsus) in the past, but experienced severe side effects such as nausea and constipation. They report having lost weight in the past by eating once a day, but have since regained the weight. Exercise is limited due to a concurrent back condition.       Review of Systems  Past Medical History:  Diagnosis Date   Allergy    Ear infection    recurrent   Hypertension     Current Outpatient Medications  Medication Sig Dispense Refill   Acetaminophen (TYLENOL ARTHRITIS PAIN PO) Take by mouth.     Ascorbic Acid 125 MG CHEW Chew 1 each by mouth daily.     atorvastatin (LIPITOR) 10 MG tablet Take 1 tablet by mouth once daily 90 tablet 1   cetirizine (ZYRTEC) 10 MG tablet Take 10 mg by mouth daily as needed.     Cholecalciferol (VITAMIN D3) 25 MCG (1000 UT) CAPS Take 5,000 Units by mouth.      clonazePAM (KLONOPIN) 0.5 MG tablet Take 1 tablet (0.5 mg total) by mouth daily as needed. for anxiety  30 tablet 0   gabapentin (NEURONTIN) 300 MG capsule TAKE 1 CAPSULE (300 MG TOTAL) BY MOUTH AT BEDTIME. 30 capsule 1   hydrocortisone (ANUSOL-HC) 2.5 % rectal cream Place 1 Application rectally 2 (two) times daily. 30 g 0   losartan-hydrochlorothiazide (HYZAAR) 50-12.5 MG tablet Take 1 tablet by mouth once daily 90 tablet 0   methocarbamol (ROBAXIN) 500 MG tablet Take 1 tablet (500 mg total) by mouth every 8 (eight) hours as needed for muscle spasms. 30 tablet 0   omeprazole (PRILOSEC) 40 MG capsule Take 1 capsule (40 mg total) by mouth 2 (two) times daily. 180 capsule 1   Turmeric (QC TUMERIC COMPLEX) 500 MG CAPS Take by mouth.     No current facility-administered medications for this visit.    No Known Allergies  Family History  Problem Relation Age of Onset   Hypertension Mother        alive   Arthritis Mother    Thyroid disease Mother    Lung cancer Father        deceased   Heart disease Father    Hypertension Maternal Grandmother     Social History   Socioeconomic History   Marital status: Widowed    Spouse name: Not on file   Number of children: 3   Years of education: Not on file   Highest education level: 12th grade  Occupational History   Occupation: customer service  Employer: [employer]  Tobacco Use   Smoking status: Former    Current packs/day: 0.00    Types: Cigarettes    Quit date: 04/20/1985    Years since quitting: 37.7    Passive exposure: Past   Smokeless tobacco: Never   Tobacco comments:    remote tobacco use   Vaping Use   Vaping status: Never Used  Substance and Sexual Activity   Alcohol use: No    Alcohol/week: 0.0 standard drinks of alcohol   Drug use: No   Sexual activity: Not Currently  Other Topics Concern   Not on file  Social History Narrative   Not on file   Social Determinants of Health   Financial Resource Strain: Low Risk  (10/26/2022)   Overall Financial Resource Strain (CARDIA)    Difficulty of Paying Living Expenses:  Not very hard  Food Insecurity: No Food Insecurity (10/26/2022)   Hunger Vital Sign    Worried About Running Out of Food in the Last Year: Never true    Ran Out of Food in the Last Year: Never true  Transportation Needs: No Transportation Needs (10/26/2022)   PRAPARE - Administrator, Civil Service (Medical): No    Lack of Transportation (Non-Medical): No  Physical Activity: Unknown (10/26/2022)   Exercise Vital Sign    Days of Exercise per Week: 0 days    Minutes of Exercise per Session: Not on file  Stress: No Stress Concern Present (10/26/2022)   [employer] of Occupational Health - Occupational Stress Questionnaire    Feeling of Stress : Not at all  Social Connections: Socially Isolated (10/26/2022)   Social Connection and Isolation Panel [NHANES]    Frequency of Communication with Friends and Family: More than three times a week    Frequency of Social Gatherings with Friends and Family: Once a week    Attends Religious Services: Never    Database administrator or Organizations: No    Attends Engineer, structural: Not on file    Marital Status: Widowed  Intimate Partner Violence: Not on file     Constitutional: Denies fever, malaise, fatigue, headache or abrupt weight changes.  HEENT: Denies eye pain, eye redness, ear pain, ringing in the ears, wax buildup, runny nose, nasal congestion, bloody nose, or sore throat. Respiratory: Denies difficulty breathing, shortness of breath, cough or sputum production.   Cardiovascular: Denies chest pain, chest tightness, palpitations or swelling in the hands or feet.  Gastrointestinal: Pt reports intermittent reflux, blood in stool (hemorrhoids). Denies abdominal pain, bloating, constipation, diarrhea.  Musculoskeletal: Pt reports chronic low back pain. Denies decrease in range of motion, difficulty with gait, or joint swelling.  Skin: Denies redness, rashes, lesions or ulcercations.  Neurological: Denies dizziness,  difficulty with memory, difficulty with speech or problems with balance and coordination.   No other specific complaints in a complete review of systems (except as listed in HPI above).     Objective:   Physical Exam  BP 136/76 (BP Location: Left Arm, Patient Position: Sitting, Cuff Size: Normal)   Pulse 74   Temp (!) 95.3 F (35.2 C) (Temporal)   Wt 236 lb (107 kg)   LMP 01/20/2016   SpO2 98%   BMI 36.96 kg/m   Wt Readings from Last 3 Encounters:  12/29/22 238 lb 6 oz (108.1 kg)  10/26/22 239 lb (108.4 kg)  09/16/22 231 lb (104.8 kg)    General: Appears her stated age, obese, in NAD. Cardiovascular:  Normal rate and rhythm. S1,S2 noted.  No murmur, rubs or gallops noted.  Pulmonary/Chest: Normal effort and positive vesicular breath sounds. No respiratory distress. No wheezes, rales or ronchi noted.  Abdomen: Soft and nontender. Normal bowel sounds. Liver, spleen and kidneys non palpable. Musculoskeletal:  No difficulty with gait.  Neurological: Alert and oriented.   BMET    Component Value Date/Time   NA 142 10/26/2022 1547   K 3.7 10/26/2022 1547   CL 104 10/26/2022 1547   CO2 30 10/26/2022 1547   GLUCOSE 126 10/26/2022 1547   BUN 16 10/26/2022 1547   CREATININE 0.61 10/26/2022 1547   CALCIUM 9.0 10/26/2022 1547   GFRNONAA >60 07/02/2021 2000   GFRAA >60 07/27/2017 2153    Lipid Panel     Component Value Date/Time   CHOL 151 12/29/2022 1359   TRIG 109 12/29/2022 1359   HDL 18 (L) 10/26/2022 1547   CHOLHDL 7.4 (H) 10/26/2022 1547   VLDL 21.0 02/09/2019 1245   LDLCALC 92 10/26/2022 1547    CBC    Component Value Date/Time   WBC 5.7 10/26/2022 1547   RBC 4.24 10/26/2022 1547   HGB 12.9 10/26/2022 1547   HCT 38.8 10/26/2022 1547   PLT 237 10/26/2022 1547   MCV 91.5 10/26/2022 1547   MCH 30.4 10/26/2022 1547   MCHC 33.2 10/26/2022 1547   RDW 13.3 10/26/2022 1547   LYMPHSABS 1.1 07/28/2016 1232   MONOABS 0.4 07/28/2016 1232   EOSABS 0.0 07/28/2016  1232   BASOSABS 0.0 07/28/2016 1232    Hgb A1C Lab Results  Component Value Date   HGBA1C 6.7 (H) 10/26/2022            Assessment & Plan:   Assessment and Plan    Nonalcoholic Steatohepatitis (NASH) with Fibrosis Stage 4 fibrosis diagnosed by GI specialist. Unclear if cirrhosis is present. Patient is not eligible for certain medications due to stage of disease. Weight loss is recommended to potentially reverse fibrosis. -Encourage patient to follow up with GI specialist for further clarification on disease stage and need for liver biopsy. -Consider weight loss medications such as Phentermine, Qsymia, or Contrave. -Consider GLP-1 agonists such as Ozempic or Mounjaro for weight loss, with caution due to previous intolerance to Rybelsus. -Encourage patient to continue working with nutritionist for weight loss.  Gastroesophageal Reflux Disease (GERD) Patient reports heartburn after certain meals. Currently on Omeprazole. -Continue Omeprazole daily. -Advise patient to avoid trigger foods.  Chronic Back Pain Limits patient's ability to exercise for weight loss. -Encourage patient to engage in low-impact activities such as walking as tolerated.  Follow-up Await response from GI specialist and reconvene to discuss weight loss options.         RTC in 4 months, follow up chronic conditions [name], NP

## 2023-01-05 NOTE — Telephone Encounter (Signed)
Pt return your call  [phone]

## 2023-01-11 ENCOUNTER — Encounter: Payer: Self-pay | Admitting: Internal Medicine

## 2023-01-11 DIAGNOSIS — K760 Fatty (change of) liver, not elsewhere classified: Secondary | ICD-10-CM

## 2023-01-11 DIAGNOSIS — B181 Chronic viral hepatitis B without delta-agent: Secondary | ICD-10-CM

## 2023-01-12 ENCOUNTER — Other Ambulatory Visit: Payer: Self-pay | Admitting: Internal Medicine

## 2023-01-12 DIAGNOSIS — R102 Pelvic and perineal pain: Secondary | ICD-10-CM

## 2023-01-12 NOTE — Telephone Encounter (Signed)
Can you take a look at this.  I referred her to the liver Specialist at [facility].  Not sure why an appointment was made with [facility] where she is already a patient.

## 2023-01-12 NOTE — Telephone Encounter (Signed)
We just received a fax from PCP office referring patient to our office for Hepatis B and NAFLD (nonalcoholic fatty liver disease) . We just saw patient 12/29/2022. Have you called patient to discuss the results or does she need a appointment and if she does where do you want me to put her at?

## 2023-01-12 NOTE — Telephone Encounter (Signed)
Called patient, discussed in length regarding her concerns about F4 Most recent ultrasound liver shows increased echogenicity and patent portal vein with normal direction of blood flow.  No features of portal hypertension.  She does not have clinical or biochemical evidence of cirrhosis of liver Referral to [facility] liver specialist was placed by PCP, apparently the consulting physician was named as Dr. Servando Snare who is part of [facility].  Her PCP is notified and [name] is correcting the referral for second opinion  Informed patient that at this point she has to work on weight loss, healthy lifestyle to get her diabetes under control Rezdiffra has not been studied in patients with F4.  We only have data on patients with F2/F3 fibrosis.  She was F3 as of 07/2021 based on NASH fibrosis panel but this drug was not approved at that time  Clarified all her questions and concerns and she felt reassured.  She is not sure if she is going to proceed with second opinion at this time.  However, I have encouraged her to think about it   Recommend clinic follow-up in 6 months with me  [name], MD  gastroenterology, [facility] [address]  [address]  [place], [place] [address]  Main: [phone]  Fax: [phone] Pager: [phone]

## 2023-01-13 NOTE — Telephone Encounter (Signed)
Requested Prescriptions  Pending Prescriptions Disp Refills   omeprazole (PRILOSEC) 40 MG capsule [Pharmacy Med Name: OMEPRAZOLE 40 MG CPDR 40 Capsule] 60 capsule 1    Sig: TAKE 1 CAPSULE BY MOUTH 2 TIMES DAILY.     Gastroenterology: Proton Pump Inhibitors Passed - 01/12/2023  9:45 AM      Passed - Valid encounter within last 12 months    Recent Outpatient Visits           1 week ago NAFLD (nonalcoholic fatty liver disease)   [place] [facility] [place], Salvadore Oxford, NP   1 month ago Medication side effect   [place] [facility] [place], [place] W, NP   2 months ago Type 2 diabetes mellitus without complication, without long-term current use of insulin [facility])   [place] [facility] [place], Salvadore Oxford, NP   4 months ago Vaginal burning   [place] [facility] [place], [place] W, NP   4 months ago Chronic right-sided low back pain with right-sided sciatica   [place] [facility] [place], Salvadore Oxford, NP       Future Appointments             In 3 months [place], Salvadore Oxford, NP [facility], [facility]

## 2023-01-14 ENCOUNTER — Encounter: Payer: Self-pay | Admitting: Dietician

## 2023-01-14 ENCOUNTER — Other Ambulatory Visit: Payer: Self-pay | Admitting: Internal Medicine

## 2023-01-14 ENCOUNTER — Encounter: Payer: No Typology Code available for payment source | Attending: Internal Medicine | Admitting: Dietician

## 2023-01-14 VITALS — Ht 67.0 in | Wt 235.6 lb

## 2023-01-14 DIAGNOSIS — E119 Type 2 diabetes mellitus without complications: Secondary | ICD-10-CM | POA: Insufficient documentation

## 2023-01-14 DIAGNOSIS — Z713 Dietary counseling and surveillance: Secondary | ICD-10-CM | POA: Diagnosis not present

## 2023-01-14 DIAGNOSIS — K760 Fatty (change of) liver, not elsewhere classified: Secondary | ICD-10-CM

## 2023-01-14 NOTE — Patient Instructions (Addendum)
Use less butter on foods and in cooking; use more olive or canola or other veg oil instead. Soft or squeezable margarine are healthier than butter, but not as healthy as the oils.  Try alternative seasoning blends such as Mrs Sharilyn Sites to make foods more appealing if needed. Limit portions of starchy foods and choose some whole grain options.  Start some light walking, a few minutes at a time, several times a week. Gradually increase time as able.  Great job eliminating unhealthy foods!! Keep it up!

## 2023-01-14 NOTE — Progress Notes (Signed)
Diabetes Self-Management Education  Visit Type: First/Initial  Appt. Start Time: 8:20  Appt. End Time: 9:40  01/14/2023  Diana [name], identified by name and date of birth, is a 62 y.o. female with a diagnosis of Diabetes: Type 2.   ASSESSMENT  Height 5\' 7"  (1.702 m), weight 235 lb 9.6 oz (106.9 kg), last menstrual period 01/20/2016. Body mass index is 36.9 kg/m.   Diabetes Self-Management Education - 01/14/23 0845       Visit Information   Visit Type First/Initial      Initial Visit   Diabetes Type Type 2    Date Diagnosed March 2024    Are you currently following a meal plan? No    Are you taking your medications as prescribed? Yes      Health Coping   How would you rate your overall health? Fair      Psychosocial Assessment   Patient Belief/Attitude about Diabetes Motivated to manage diabetes    Learning Readiness Not Ready    How often do you need to have someone help you when you read instructions, pamphlets, or other written materials from your doctor or pharmacy? 1 - Never    What is the last grade level you completed in school? 10th      Pre-Education Assessment   Patient understands the diabetes disease and treatment process. Needs Instruction    Patient understands incorporating nutritional management into lifestyle. Needs Instruction    Patient undertands incorporating physical activity into lifestyle. Needs Instruction    Patient understands using medications safely. Comprehends key points    Patient understands monitoring blood glucose, interpreting and using results N/A (comment)   BG testing has not been prescribed at this time.   Patient understands prevention, detection, and treatment of acute complications. Needs Instruction    Patient understands prevention, detection, and treatment of chronic complications. Needs Instruction    Patient understands how to develop strategies to address psychosocial issues. Needs Instruction    Patient understands  how to develop strategies to promote health/change behavior. Comprehends key points      Complications   Last HgB A1C per patient/outside source 6.7 %   June 2024   How often do you check your blood sugar? 0 times/day (not testing)    Have you had a dilated eye exam in the past 12 months? Yes    Have you had a dental exam in the past 12 months? Yes    Are you checking your feet? No      Dietary Intake   Breakfast no breakfast    Snack (morning) about 10:30/11, Fiber One bar, water, Milo's no sugar tea    Lunch 2pm smoked [place] breakfast sandwhich    Snack (afternoon) has cut out a lot of the processed snacks like chips and eating out as much    Clinical cytogeneticist, onions  hamburger with lots of onions, cheese. Boyfriend cooks, chicken and [place]. Uses the air fryer. [place] fries cooked in the air fryer. Doesn't eat fruits and variety of vegetables. Corn, green beans, butter peas, baked squash    Snack (evening) Used to eat out all the time, fast food, but has stopped.    Beverage(s) water and tea      Activity / Exercise   Activity / Exercise Type ADL's      Patient Education   Previous Diabetes Education No    Healthy Eating Role of diet in the treatment of diabetes and the relationship between the three main  macronutrients and blood glucose level;Food label reading, portion sizes and measuring food.;Plate Method;Carbohydrate counting;Meal timing in regards to the patients' current diabetes medication.;Meal options for control of blood glucose level and chronic complications.    Being Active Role of exercise on diabetes management, blood pressure control and cardiac health.;Helped patient identify appropriate exercises in relation to his/her diabetes, diabetes complications and other health issue.    Chronic complications Relationship between chronic complications and blood glucose control      Outcomes   Expected Outcomes Demonstrated interest in learning. Expect positive outcomes     Future DMSE 2 months             Individualized Plan for Diabetes Self-Management Training:   Learning Objective:  Patient will have a greater understanding of diabetes self-management. Patient education plan is to attend individual and/or group sessions per assessed needs and concerns.  Other intervention notes: Dislikes most vegetables and all fruits Discussed creative ways to include vegetable or fruit based nutrition   Plan:   Patient Instructions  Use less butter on foods and in cooking; use more olive or canola or other veg oil instead. Soft or squeezable margarine are healthier than butter, but not as healthy as the oils.  Try alternative seasoning blends such as Mrs Sharilyn Sites to make foods more appealing if needed. Limit portions of starchy foods and choose some whole grain options.  Start some light walking, a few minutes at a time, several times a week. Gradually increase time as able.  Great job eliminating unhealthy foods!! Keep it up!  Expected Outcomes:  Demonstrated interest in learning. Expect positive outcomes  Education material provided:  Plate planner, general guidelines for diabetes, NAFLD Diet Booklet  If problems or questions, patient to contact team via:  Phone  Future DSME appointment: 2 months

## 2023-01-15 MED ORDER — METHOCARBAMOL 500 MG PO TABS: 500.0000 mg | ORAL_TABLET | Freq: Three times a day (TID) | ORAL | 0 refills | Status: DC | PRN

## 2023-01-29 ENCOUNTER — Telehealth: Payer: Self-pay | Admitting: Orthopedic Surgery

## 2023-01-29 ENCOUNTER — Other Ambulatory Visit: Payer: Self-pay | Admitting: Orthopedic Surgery

## 2023-01-29 DIAGNOSIS — M48061 Spinal stenosis, lumbar region without neurogenic claudication: Secondary | ICD-10-CM

## 2023-01-29 DIAGNOSIS — M47816 Spondylosis without myelopathy or radiculopathy, lumbar region: Secondary | ICD-10-CM

## 2023-01-29 DIAGNOSIS — M5416 Radiculopathy, lumbar region: Secondary | ICD-10-CM

## 2023-01-29 MED ORDER — GABAPENTIN 300 MG PO CAPS
300.0000 mg | ORAL_CAPSULE | Freq: Every day | ORAL | 1 refills | Status: DC
Start: 2023-01-29 — End: 2023-03-31

## 2023-01-29 NOTE — Telephone Encounter (Signed)
Patient notified

## 2023-01-29 NOTE — Telephone Encounter (Signed)
Neurontin sent to pharmacy. Please let her know.

## 2023-01-29 NOTE — Progress Notes (Unsigned)
Referring Physician:  [name], NP [address] [place],  [place] [address]  Primary Physician:  [name], NP  History of Present Illness: Diana [name] has a history of HTN, GERD, chronic hep B, hyperlipidemia, DM, and prediabetes.   History of chronic constant LBP with right leg pain with flare ups. She has known lumbar spondylosis with disc bulging at L4-S1. Mild central stenosis L4-L5.   She was doing well at her last phone visit on 11/09/22. She was to start lumbar HEP. She was to continue with prn motrin, prn robaxin, and neurontin.   She is here for follow up.   She has good days and bad days. She has intermittent LBP that is mostly tolerable. She has intermittent right > left posterior leg pain. She notes some burning pain in her buttocks and legs that is intermittent. She still has some numbness in right lateral foot and feels some numbness in bilateral lateral legs as well.    She is taking neurontin at night. She is taking prn motrin and robaxin. Not doing HEP I gave her much at all.    Bowel/Bladder Dysfunction: none  Conservative measures:  Physical therapy: did 4 years ago and made her worse Multimodal medical therapy including regular antiinflammatories: prednisone taper, robaxin, mobic, tramadol  Injections:  Had 9-10 years ago with no relief   Past Surgery: No spinal surgery   Review of Systems:  A 10 point review of systems is negative, except for the pertinent positives and negatives detailed in the HPI.  Past Medical History: Past Medical History:  Diagnosis Date   Allergy    Ear infection    recurrent   Hypertension     Past Surgical History: Past Surgical History:  Procedure Laterality Date   ABDOMINAL HYSTERECTOMY  2019   partial   COLONOSCOPY WITH PROPOFOL N/A 11/10/2021   Procedure: COLONOSCOPY WITH PROPOFOL;  Surgeon: Toney Reil, MD;  Location: [facility] ENDOSCOPY;  Service: Gastroenterology;  Laterality: N/A;    TONSILLECTOMY     TUBAL LIGATION     bilateral    Allergies: Allergies as of 02/01/2023   (No Known Allergies)    Medications: Outpatient Encounter Medications as of 02/01/2023  Medication Sig   Ascorbic Acid 125 MG CHEW Chew 1 each by mouth daily.   atorvastatin (LIPITOR) 10 MG tablet Take 1 tablet by mouth once daily   cetirizine (ZYRTEC) 10 MG tablet Take 10 mg by mouth daily as needed.   Cholecalciferol (VITAMIN D3) 25 MCG (1000 UT) CAPS Take 5,000 Units by mouth.    clonazePAM (KLONOPIN) 0.5 MG tablet Take 1 tablet (0.5 mg total) by mouth daily as needed. for anxiety   gabapentin (NEURONTIN) 300 MG capsule Take 1 capsule (300 mg total) by mouth at bedtime.   hydrocortisone (ANUSOL-HC) 2.5 % rectal cream Place 1 Application rectally 2 (two) times daily.   ibuprofen (ADVIL) 200 MG tablet Take 200 mg by mouth every 6 (six) hours as needed.   losartan-hydrochlorothiazide (HYZAAR) 50-12.5 MG tablet Take 1 tablet by mouth once daily   methocarbamol (ROBAXIN) 500 MG tablet Take 1 tablet (500 mg total) by mouth every 8 (eight) hours as needed for muscle spasms.   omeprazole (PRILOSEC) 40 MG capsule TAKE 1 CAPSULE BY MOUTH 2 TIMES DAILY.   Turmeric (QC TUMERIC COMPLEX) 500 MG CAPS Take by mouth.   No facility-administered encounter medications on file as of 02/01/2023.    Social History: Social History   Tobacco  Use   Smoking status: Former    Current packs/day: 0.00    Types: Cigarettes    Quit date: 04/20/1985    Years since quitting: 37.8    Passive exposure: Past   Smokeless tobacco: Never   Tobacco comments:    remote tobacco use   Vaping Use   Vaping status: Never Used  Substance Use Topics   Alcohol use: No    Alcohol/week: 0.0 standard drinks of alcohol   Drug use: No    Family Medical History: Family History  Problem Relation Age of Onset   Hypertension Mother        alive   Arthritis Mother    Thyroid disease Mother    Lung cancer Father        deceased    Heart disease Father    Hypertension Maternal Grandmother     Physical Examination: There were no vitals filed for this visit.  Awake, alert, oriented to person, place, and time.  Speech is clear and fluent. Fund of knowledge is appropriate.   Cranial Nerves: Pupils equal round and reactive to light.  Facial tone is symmetric.    No abnormal lesions on exposed skin.   Strength:  Side Iliopsoas Quads Hamstring PF DF EHL  R 5 5 5 5 5 5   L 5 5 5 5 5 5    Reflexes are 2+ and symmetric at the patella and achilles.    Clonus is not present.   Bilateral lower extremity sensation is intact to light touch, but subjectively diminished per patient in lateral legs.   Gait is normal.    Medical Decision Making  Imaging: None   Assessment and Plan: Diana [name] is a pleasant 62 y.o. female with a history of chronic constant LBP with right leg pain with flare ups.    Overall, she is doing reasonably well. She has good days and bad days. She has intermittent LBP that is mostly tolerable. She has intermittent right > left posterior leg pain. She notes some burning pain in her buttocks and legs that is intermittent. She still has some numbness in right lateral foot and feels some numbness in bilateral lateral legs as well.    She has know lumbar spondylosis with disc bulging at L4-S1. Mild central stenosis L4-L5.    Treatment options discussed with patient and following plan made:   - Continue current medications including prn motrin and robaxin. Reviewed dosing and side effects. Take motrin with food. - Continue on neurotin at night.  - EMG/NCS of bilateral lower extremities to evaluate numbness in both legs. [facility] to [facility].  - She can follow up with PMR to discuss injections if pain gets worse.  - Will schedule phone visit to review her EMG results once I have them back.   I spent a total of 15 minutes in face-to-face and non-face-to-face activities related to this  patient's care today including review of outside records, review of imaging, review of symptoms, physical exam, discussion of differential diagnosis, discussion of treatment options, and documentation.   Drake Leach PA-C Dept. of Neurosurgery

## 2023-01-29 NOTE — Telephone Encounter (Signed)
Appt on 02/01/23 She is out of Gabapentin 300mg  1 at bed time [facility]

## 2023-02-01 ENCOUNTER — Ambulatory Visit (INDEPENDENT_AMBULATORY_CARE_PROVIDER_SITE_OTHER): Payer: No Typology Code available for payment source | Admitting: Orthopedic Surgery

## 2023-02-01 ENCOUNTER — Encounter: Payer: Self-pay | Admitting: Orthopedic Surgery

## 2023-02-01 VITALS — BP 126/78 | Ht 67.0 in | Wt 231.4 lb

## 2023-02-01 DIAGNOSIS — M5416 Radiculopathy, lumbar region: Secondary | ICD-10-CM

## 2023-02-01 DIAGNOSIS — M48061 Spinal stenosis, lumbar region without neurogenic claudication: Secondary | ICD-10-CM | POA: Diagnosis not present

## 2023-02-01 DIAGNOSIS — M5126 Other intervertebral disc displacement, lumbar region: Secondary | ICD-10-CM

## 2023-02-01 DIAGNOSIS — M4726 Other spondylosis with radiculopathy, lumbar region: Secondary | ICD-10-CM | POA: Diagnosis not present

## 2023-02-01 DIAGNOSIS — M47816 Spondylosis without myelopathy or radiculopathy, lumbar region: Secondary | ICD-10-CM

## 2023-02-01 NOTE — Patient Instructions (Signed)
It was so nice to see you today. Thank you so much for coming in.    I want to get an EMG of both legs (nerve conduction test) to look into things further. I have ordered this and [facility] will call you to schedule. You can also call them at [phone].   Once I have the results back of the nerve test, we will call you to schedule a phone visit.   Please do not hesitate to call if you have any questions or concerns. You can also message me in MyChart.   Drake Leach PA-C [phone]     The physicians and staff at [facility] are committed to providing excellent care. You may receive a survey requesting feedback about your experience at our office. We strive to receive "very good" responses to the survey questions. If you feel that your experience would prevent you from giving the office a "very good " response, please contact our office to try to remedy the situation. We may be reached at [phone]. Thank you for taking the time out of your busy day to complete the survey.

## 2023-02-02 ENCOUNTER — Ambulatory Visit: Payer: No Typology Code available for payment source | Admitting: Internal Medicine

## 2023-02-02 ENCOUNTER — Ambulatory Visit: Payer: BC Managed Care – PPO | Admitting: Gastroenterology

## 2023-02-02 ENCOUNTER — Encounter: Payer: Self-pay | Admitting: Internal Medicine

## 2023-02-02 VITALS — BP 120/80 | HR 79 | Ht 67.0 in | Wt 232.0 lb

## 2023-02-02 DIAGNOSIS — M791 Myalgia, unspecified site: Secondary | ICD-10-CM

## 2023-02-02 DIAGNOSIS — T466X5A Adverse effect of antihyperlipidemic and antiarteriosclerotic drugs, initial encounter: Secondary | ICD-10-CM

## 2023-02-02 DIAGNOSIS — K219 Gastro-esophageal reflux disease without esophagitis: Secondary | ICD-10-CM | POA: Diagnosis not present

## 2023-02-02 NOTE — Progress Notes (Signed)
Subjective:    Patient ID: Diana [name], female    DOB: [birth year], 62 y.o.   MRN: [id number]  HPI  Discussed the use of AI scribe software for clinical note transcription with the patient, who gave verbal consent to proceed.  The patient, with a history of gastroesophageal reflux disease (GERD) and hyperlipidemia, presented with a recent exacerbation of acid reflux symptoms. They reported severe reflux symptoms for a couple of weeks, which they attributed to the consumption of a no-sugar tea containing sucralose. The symptoms resolved a few days after discontinuing the tea. They continue to take omeprazole twice daily for GERD management.  She also reports she has been having myalgias mainly in her back for weeks.  The pain was severe enough to prompt them to discontinue the atorvastatin, after which the pain resolved within a week. They had been taking a half tablet of atorvastatin daily.     Review of Systems     Past Medical History:  Diagnosis Date   Allergy    Ear infection    recurrent   Hypertension     Current Outpatient Medications  Medication Sig Dispense Refill   Ascorbic Acid 125 MG CHEW Chew 1 each by mouth daily.     atorvastatin (LIPITOR) 10 MG tablet Take 1 tablet by mouth once daily 90 tablet 1   cetirizine (ZYRTEC) 10 MG tablet Take 10 mg by mouth daily as needed.     Cholecalciferol (VITAMIN D3) 25 MCG (1000 UT) CAPS Take 5,000 Units by mouth.      clonazePAM (KLONOPIN) 0.5 MG tablet Take 1 tablet (0.5 mg total) by mouth daily as needed. for anxiety 30 tablet 0   gabapentin (NEURONTIN) 300 MG capsule Take 1 capsule (300 mg total) by mouth at bedtime. 30 capsule 1   hydrocortisone (ANUSOL-HC) 2.5 % rectal cream Place 1 Application rectally 2 (two) times daily. 30 g 0   ibuprofen (ADVIL) 200 MG tablet Take 200 mg by mouth every 6 (six) hours as needed.     losartan-hydrochlorothiazide (HYZAAR) 50-12.5 MG tablet Take 1 tablet by mouth once daily 90 tablet 0    methocarbamol (ROBAXIN) 500 MG tablet Take 1 tablet (500 mg total) by mouth every 8 (eight) hours as needed for muscle spasms. 30 tablet 0   omeprazole (PRILOSEC) 40 MG capsule TAKE 1 CAPSULE BY MOUTH 2 TIMES DAILY. 60 capsule 1   Turmeric (QC TUMERIC COMPLEX) 500 MG CAPS Take by mouth.     No current facility-administered medications for this visit.    No Known Allergies  Family History  Problem Relation Age of Onset   Hypertension Mother        alive   Arthritis Mother    Thyroid disease Mother    Lung cancer Father        deceased   Heart disease Father    Hypertension Maternal Grandmother     Social History   Socioeconomic History   Marital status: Widowed    Spouse name: Not on file   Number of children: 3   Years of education: Not on file   Highest education level: 12th grade  Occupational History   Occupation: customer service    Employer: [employer]  Tobacco Use   Smoking status: Former    Current packs/day: 0.00    Types: Cigarettes    Quit date: 04/20/1985    Years since quitting: 37.8    Passive exposure: Past   Smokeless tobacco: Never  Tobacco comments:    remote tobacco use   Vaping Use   Vaping status: Never Used  Substance and Sexual Activity   Alcohol use: No    Alcohol/week: 0.0 standard drinks of alcohol   Drug use: No   Sexual activity: Not Currently  Other Topics Concern   Not on file  Social History Narrative   Not on file   Social Determinants of Health   Financial Resource Strain: Low Risk  (10/26/2022)   Overall Financial Resource Strain (CARDIA)    Difficulty of Paying Living Expenses: Not very hard  Food Insecurity: No Food Insecurity (10/26/2022)   Hunger Vital Sign    Worried About Running Out of Food in the Last Year: Never true    Ran Out of Food in the Last Year: Never true  Transportation Needs: No Transportation Needs (10/26/2022)   PRAPARE - Administrator, Civil Service (Medical): No    Lack of Transportation  (Non-Medical): No  Physical Activity: Unknown (10/26/2022)   Exercise Vital Sign    Days of Exercise per Week: 0 days    Minutes of Exercise per Session: Not on file  Stress: No Stress Concern Present (10/26/2022)   [employer] of Occupational Health - Occupational Stress Questionnaire    Feeling of Stress : Not at all  Social Connections: Socially Isolated (10/26/2022)   Social Connection and Isolation Panel [NHANES]    Frequency of Communication with Friends and Family: More than three times a week    Frequency of Social Gatherings with Friends and Family: Once a week    Attends Religious Services: Never    Database administrator or Organizations: No    Attends Engineer, structural: Not on file    Marital Status: Widowed  Intimate Partner Violence: Not on file     Constitutional: Denies fever, malaise, fatigue, headache or abrupt weight changes.  HEENT: Denies eye pain, eye redness, ear pain, ringing in the ears, wax buildup, runny nose, nasal congestion, bloody nose, or sore throat. Respiratory: Denies difficulty breathing, shortness of breath, cough or sputum production.   Cardiovascular: Denies chest pain, chest tightness, palpitations or swelling in the hands or feet.  Gastrointestinal: Patient reports reflux.  Denies abdominal pain, bloating, constipation, diarrhea or blood in the stool.  GU: Denies urgency, frequency, pain with urination, burning sensation, blood in urine, odor or discharge. Musculoskeletal: Patient reports chronic low back pain, myalgia.  Denies decrease in range of motion, difficulty with gait, or joint swelling.  Skin: Denies redness, rashes, lesions or ulcercations.  Neurological: Denies dizziness, difficulty with memory, difficulty with speech or problems with balance and coordination.  Psych: Patient has a history of anxiety and depression.  Denies SI/HI.  No other specific complaints in a complete review of systems (except as listed in HPI  above).  Objective:   Physical Exam  BP 120/80   Pulse 79   Ht 5\' 7"  (1.702 m)   Wt 232 lb (105.2 kg)   LMP 01/20/2016   SpO2 98%   BMI 36.34 kg/m   Wt Readings from Last 3 Encounters:  02/01/23 231 lb 6.4 oz (105 kg)  01/14/23 235 lb 9.6 oz (106.9 kg)  01/05/23 236 lb (107 kg)    General: Appears her stated age, obese, in NAD. Cardiovascular: Normal rate and rhythm. S1,S2 noted.  No murmur, rubs or gallops noted.  Pulmonary/Chest: Normal effort and positive vesicular breath sounds. No respiratory distress. No wheezes, rales or ronchi noted.  Abdomen: Soft and nontender. Normal bowel sounds.  Musculoskeletal: No difficulty with gait.  Neurological: Alert and oriented. Coordination normal.    BMET    Component Value Date/Time   NA 142 10/26/2022 1547   K 3.7 10/26/2022 1547   CL 104 10/26/2022 1547   CO2 30 10/26/2022 1547   GLUCOSE 126 10/26/2022 1547   BUN 16 10/26/2022 1547   CREATININE 0.61 10/26/2022 1547   CALCIUM 9.0 10/26/2022 1547   GFRNONAA >60 07/02/2021 2000   GFRAA >60 07/27/2017 2153    Lipid Panel     Component Value Date/Time   CHOL 151 12/29/2022 1359   TRIG 109 12/29/2022 1359   HDL 18 (L) 10/26/2022 1547   CHOLHDL 7.4 (H) 10/26/2022 1547   VLDL 21.0 02/09/2019 1245   LDLCALC 92 10/26/2022 1547    CBC    Component Value Date/Time   WBC 5.7 10/26/2022 1547   RBC 4.24 10/26/2022 1547   HGB 12.9 10/26/2022 1547   HCT 38.8 10/26/2022 1547   PLT 237 10/26/2022 1547   MCV 91.5 10/26/2022 1547   MCH 30.4 10/26/2022 1547   MCHC 33.2 10/26/2022 1547   RDW 13.3 10/26/2022 1547   LYMPHSABS 1.1 07/28/2016 1232   MONOABS 0.4 07/28/2016 1232   EOSABS 0.0 07/28/2016 1232   BASOSABS 0.0 07/28/2016 1232    Hgb A1C Lab Results  Component Value Date   HGBA1C 6.7 (H) 10/26/2022           Assessment & Plan:   Assessment and Plan    Gastroesophageal Reflux Disease (GERD) Recent exacerbation of symptoms, possibly related to tea  consumption. Symptoms improved after discontinuation of tea. -Continue Omeprazole twice daily. -Avoid artificial sweeteners and tea.  Statin-Induced Myalgia Muscle pain from shoulder to hip, improved after discontinuation of Atorvastatin. -Resume Atorvastatin at half dose three times a week (Monday, Wednesday, Friday). -Use a pill box to keep track of medication schedule. -Notify physician if muscle aches recur.  Hyperlipidemia Previously poorly controlled, now within normal range on Atorvastatin. -Continue with dietary modifications for weight loss and cholesterol control. -Resume Atorvastatin at adjusted dose as above.      RTC in 3 months, follow-up chronic conditions [name], NP

## 2023-02-02 NOTE — Patient Instructions (Signed)

## 2023-02-10 ENCOUNTER — Ambulatory Visit: Payer: No Typology Code available for payment source | Admitting: Podiatry

## 2023-02-17 ENCOUNTER — Ambulatory Visit
Admission: RE | Admit: 2023-02-17 | Discharge: 2023-02-17 | Disposition: A | Discharge status: Home / Self Care | Payer: No Typology Code available for payment source | Source: Ambulatory Visit | Attending: Internal Medicine | Admitting: Internal Medicine

## 2023-02-17 DIAGNOSIS — Z780 Asymptomatic menopausal state: Secondary | ICD-10-CM | POA: Insufficient documentation

## 2023-02-17 DIAGNOSIS — M85851 Other specified disorders of bone density and structure, right thigh: Secondary | ICD-10-CM | POA: Diagnosis not present

## 2023-02-17 DIAGNOSIS — Z1231 Encounter for screening mammogram for malignant neoplasm of breast: Secondary | ICD-10-CM | POA: Diagnosis present

## 2023-02-19 ENCOUNTER — Encounter: Payer: Self-pay | Admitting: Internal Medicine

## 2023-02-19 ENCOUNTER — Ambulatory Visit: Payer: No Typology Code available for payment source | Admitting: Podiatry

## 2023-02-19 MED ORDER — TRAZODONE HCL 50 MG PO TABS: 25.0000 mg | ORAL_TABLET | Freq: Every evening | ORAL | 0 refills | Status: DC | PRN

## 2023-02-19 NOTE — Addendum Note (Signed)
Addended by: [name] on: 02/19/2023 01:10 PM   Modules accepted: Orders

## 2023-02-24 ENCOUNTER — Ambulatory Visit (INDEPENDENT_AMBULATORY_CARE_PROVIDER_SITE_OTHER): Payer: Medicaid Other | Admitting: Podiatry

## 2023-02-24 ENCOUNTER — Encounter: Payer: Self-pay | Admitting: Podiatry

## 2023-02-24 DIAGNOSIS — M7662 Achilles tendinitis, left leg: Secondary | ICD-10-CM

## 2023-02-24 NOTE — Progress Notes (Signed)
Subjective:  Patient ID: Diana [name], female    DOB: [birth year],  MRN: [id number]  Chief Complaint  Patient presents with   Plantar Fasciitis    PATIENT STATES THAT HER LEFT HEELS HAS STARTING HURTING AGAIN , FOR THE PAST COUPLE OF WEEKS AND SHE IS NOT TAKING MEDICATION FOR PAIN .     62 y.o. female presents with the above complaint.  Patient presents for follow-up of left Achilles tendinitis.  She is she was doing okay but her pain started coming back.  She denies any other acute complaints   Review of Systems: Negative except as noted in the HPI. Denies N/V/F/Ch.  Past Medical History:  Diagnosis Date   Allergy    Ear infection    recurrent   Hypertension     Current Outpatient Medications:    Ascorbic Acid 125 MG CHEW, Chew 1 each by mouth daily., Disp: , Rfl:    atorvastatin (LIPITOR) 10 MG tablet, Take 1 tablet by mouth once daily, Disp: 90 tablet, Rfl: 1   cetirizine (ZYRTEC) 10 MG tablet, Take 10 mg by mouth daily as needed., Disp: , Rfl:    Cholecalciferol (VITAMIN D3) 25 MCG (1000 UT) CAPS, Take 5,000 Units by mouth. , Disp: , Rfl:    clonazePAM (KLONOPIN) 0.5 MG tablet, Take 1 tablet (0.5 mg total) by mouth daily as needed. for anxiety, Disp: 30 tablet, Rfl: 0   gabapentin (NEURONTIN) 300 MG capsule, Take 1 capsule (300 mg total) by mouth at bedtime., Disp: 30 capsule, Rfl: 1   hydrocortisone (ANUSOL-HC) 2.5 % rectal cream, Place 1 Application rectally 2 (two) times daily., Disp: 30 g, Rfl: 0   ibuprofen (ADVIL) 200 MG tablet, Take 200 mg by mouth every 6 (six) hours as needed., Disp: , Rfl:    losartan-hydrochlorothiazide (HYZAAR) 50-12.5 MG tablet, Take 1 tablet by mouth once daily, Disp: 90 tablet, Rfl: 0   methocarbamol (ROBAXIN) 500 MG tablet, Take 1 tablet (500 mg total) by mouth every 8 (eight) hours as needed for muscle spasms., Disp: 30 tablet, Rfl: 0   omeprazole (PRILOSEC) 40 MG capsule, TAKE 1 CAPSULE BY MOUTH 2 TIMES DAILY., Disp: 60 capsule, Rfl: 1    traZODone (DESYREL) 50 MG tablet, Take 0.5-1 tablets (25-50 mg total) by mouth at bedtime as needed for sleep., Disp: 30 tablet, Rfl: 0   Turmeric (QC TUMERIC COMPLEX) 500 MG CAPS, Take by mouth., Disp: , Rfl:   Social History   Tobacco Use  Smoking Status Former   Current packs/day: 0.00   Types: Cigarettes   Quit date: 04/20/1985   Years since quitting: 37.8   Passive exposure: Past  Smokeless Tobacco Never  Tobacco Comments   remote tobacco use     No Known Allergies Objective:  There were no vitals filed for this visit. There is no height or weight on file to calculate BMI. Constitutional Well developed. Well nourished.  Vascular Dorsalis pedis pulses palpable bilaterally. Posterior tibial pulses palpable bilaterally. Capillary refill normal to all digits.  No cyanosis or clubbing noted. Pedal hair growth normal.  Neurologic Normal speech. Oriented to person, place, and time. Epicritic sensation to light touch grossly present bilaterally.  Dermatologic Nails well groomed and normal in appearance. No open wounds. No skin lesions.  Orthopedic: Normal joint ROM without pain or crepitus bilaterally. No visible deformities. Tender to palpation at the calcaneal tuber right. No pain with calcaneal squeeze right. Ankle ROM diminished range of motion right. Silfverskiold Test: positive right.  Pain on  palpation of left Achilles insertional pain.  Pain with dorsiflexion of the ankle joint no pain with plantarflexion of the ankle joint.  Clinically able to appreciate Haglund's deformity positive Silfverskiold test noted with gastrocnemius equinus to the left side as well   Radiographs: None Assessment:   No diagnosis found.   Plan:  Patient was evaluated and treated and all questions answered.  Left Achilles tendonitis -I explained the patient the etiology of Achilles tendinitis emergency room and options were discussed.  She currently has cam boot at home.  She will place  herself back in it for next 4 weeks.  Will review to reevaluate if there is any improvement if no improvement we discussed getting an MRI   Plantar Fasciitis, right -Clinically healed and is doing well with the steroid injection and bracing.  At this time patient does not have any pain in the foot I discussed shoe gear modification and possible orthotics management.  No follow-ups on file.

## 2023-03-03 ENCOUNTER — Other Ambulatory Visit: Payer: Self-pay | Admitting: Orthopedic Surgery

## 2023-03-03 DIAGNOSIS — M48061 Spinal stenosis, lumbar region without neurogenic claudication: Secondary | ICD-10-CM

## 2023-03-03 DIAGNOSIS — M47816 Spondylosis without myelopathy or radiculopathy, lumbar region: Secondary | ICD-10-CM

## 2023-03-03 DIAGNOSIS — M5416 Radiculopathy, lumbar region: Secondary | ICD-10-CM

## 2023-03-03 NOTE — Telephone Encounter (Signed)
Refill request for neurontin.   She was given 1 month supply on 01/29/23 with a refill. She should not be out yet.   Have her call us when running low.   Also, she did have nerve test done that we discussed at her last visit. Looks like she needs to call [facility] to get scheduled. Their number is [phone].

## 2023-03-04 NOTE — Telephone Encounter (Signed)
Spoke with patient and she states the pharmacy was the one to inform her she had no refills so that is why she placed a request but they were looking at an old order. She is good on medication and has picked up her prescription.   Patient also states she got a message from [facility] and that she is working on making an appointment with them

## 2023-03-11 ENCOUNTER — Other Ambulatory Visit: Payer: Self-pay

## 2023-03-11 ENCOUNTER — Ambulatory Visit: Payer: BC Managed Care – PPO | Admitting: Dietician

## 2023-03-11 ENCOUNTER — Other Ambulatory Visit: Payer: Self-pay | Admitting: Internal Medicine

## 2023-03-11 DIAGNOSIS — R202 Paresthesia of skin: Secondary | ICD-10-CM

## 2023-03-12 ENCOUNTER — Encounter: Payer: Medicaid Other | Admitting: Neurology

## 2023-03-12 NOTE — Telephone Encounter (Signed)
Requested Prescriptions  Pending Prescriptions Disp Refills   losartan-hydrochlorothiazide (HYZAAR) 50-12.5 MG tablet [Pharmacy Med Name: Losartan Potassium-HCTZ 50-12.5 MG Oral Tablet] 90 tablet 0    Sig: Take 1 tablet by mouth once daily     Cardiovascular: ARB + Diuretic Combos Passed - 03/11/2023 11:21 AM      Passed - K in normal range and within 180 days    Potassium  Date Value Ref Range Status  10/26/2022 3.7 3.5 - 5.3 mmol/L Final         Passed - Na in normal range and within 180 days    Sodium  Date Value Ref Range Status  10/26/2022 142 135 - 146 mmol/L Final         Passed - Cr in normal range and within 180 days    Creat  Date Value Ref Range Status  10/26/2022 0.61 0.50 - 1.03 mg/dL Final   Creatinine, Urine  Date Value Ref Range Status  10/26/2022 113 20 - 275 mg/dL Final         Passed - eGFR is 10 or above and within 180 days    GFR calc Af Amer  Date Value Ref Range Status  07/27/2017 >60 >60 mL/min Final    Comment:    (NOTE) The eGFR has been calculated using the CKD EPI equation. This calculation has not been validated in all clinical situations. eGFR's persistently <60 mL/min signify possible Chronic Kidney Disease.    GFR, Estimated  Date Value Ref Range Status  07/02/2021 >60 >60 mL/min Final    Comment:    (NOTE) Calculated using the CKD-EPI Creatinine Equation (2021)    GFR  Date Value Ref Range Status  09/19/2019 92.37 >60.00 mL/min Final   eGFR  Date Value Ref Range Status  10/26/2022 103 > OR = 60 mL/min/1.34m2 Final         Passed - Patient is not pregnant      Passed - Last BP in normal range    BP Readings from Last 1 Encounters:  02/02/23 120/80         Passed - Valid encounter within last 6 months    Recent Outpatient Visits           1 month ago Gastroesophageal reflux disease without esophagitis   [place] [facility] [place], [place] W, NP   2 months ago NAFLD (nonalcoholic fatty liver  disease)   [place] [facility], Salvadore Oxford, NP   3 months ago Medication side effect   [place] [facility] [place], [place] W, NP   4 months ago Type 2 diabetes mellitus without complication, without long-term current use of insulin [facility])   [place] [facility], Salvadore Oxford, NP   6 months ago Vaginal burning   [place] [facility] [place], Salvadore Oxford, NP       Future Appointments             In 1 month [place], Salvadore Oxford, NP  [facility], [facility]

## 2023-03-15 ENCOUNTER — Encounter: Payer: Self-pay | Admitting: Dietician

## 2023-03-15 ENCOUNTER — Encounter: Payer: Medicaid Other | Attending: Internal Medicine | Admitting: Dietician

## 2023-03-15 VITALS — Wt 232.3 lb

## 2023-03-15 DIAGNOSIS — E119 Type 2 diabetes mellitus without complications: Secondary | ICD-10-CM | POA: Insufficient documentation

## 2023-03-15 DIAGNOSIS — Z6836 Body mass index (BMI) 36.0-36.9, adult: Secondary | ICD-10-CM | POA: Insufficient documentation

## 2023-03-15 DIAGNOSIS — K760 Fatty (change of) liver, not elsewhere classified: Secondary | ICD-10-CM

## 2023-03-15 DIAGNOSIS — Z713 Dietary counseling and surveillance: Secondary | ICD-10-CM | POA: Diagnosis not present

## 2023-03-15 NOTE — Patient Instructions (Signed)
Return to limiting evening snacks on days when eating a later dinner. When eating an early dinner, plan to have a healthy snack 3-4 hours afterwards, like yogurt (can mix with some peanut butter and honey) with 1-2 graham crackers, or popcorn low in salt and butter Find ways to add veggies into other foods ie pasta, or a few raw veggies and small amount of dip or hummus.  Include a simple light meal midday such as whole grain crackers or bread/ pita chips with low fat cheese, hummus, or low sodium turkey/ chicken Find some simple chair exercises or consider an under-chair pedaller to gradually increase physical activity to help with weight loss and blood sugar control

## 2023-03-15 NOTE — Progress Notes (Signed)
Diabetes Self-Management Education  Visit Type:  Follow-up  Appt. Start Time: 1045 Appt. End Time: 1155  03/15/2023  Diana [name], identified by name and date of birth, is a 62 y.o. female with a diagnosis of Diabetes:  .   ASSESSMENT  Weight 232 lb 4.8 oz (105.4 kg), last menstrual period 01/20/2016. Body mass index is 36.38 kg/m.    Diabetes Self-Management Education - 03/15/23 1137       Complications   Number of hypoglycemic episodes per month 0      Dietary Intake   Breakfast usually none; maybe once a week eggs and chicken sausage    Snack (morning) Fiber One bar    Lunch popcorn; sandwich    Dinner time varies from 4-5pm - 6-7pm per schedule; chicken, pasta with cheese, alfredo sauce; air fryer    Snack (evening) recently occasional snack on days with early dinner -- veggie chips; popcorners    Beverage(s) mostly water      Activity / Exercise   Activity / Exercise Type ADL's   foot problems preventing exercise; seeing podiatrist     Patient Education   Healthy Eating Role of diet in the treatment of diabetes and the relationship between the three main macronutrients and blood glucose level;Food label reading, portion sizes and measuring food.;Meal timing in regards to the patients' current diabetes medication.;Meal options for control of blood glucose level and chronic complications.;Other (comment)   healthy carb choices   Being Active Helped patient identify appropriate exercises in relation to his/her diabetes, diabetes complications and other health issue.    Monitoring Daily foot exams    Chronic complications Relationship between chronic complications and blood glucose control      Post-Education Assessment   Patient understands the diabetes disease and treatment process. Needs Instruction    Patient understands incorporating nutritional management into lifestyle. Comprehends key points    Patient undertands incorporating physical activity into lifestyle.  Comprehends key points    Patient understands using medications safely. Comphrehends key points    Patient understands monitoring blood glucose, interpreting and using results N/A    Patient understands prevention, detection, and treatment of acute complications. Needs Instruction    Patient understands prevention, detection, and treatment of chronic complications. Needs Review    Patient understands how to develop strategies to address psychosocial issues. Comprehends key points    Patient understands how to develop strategies to promote health/change behavior. Comprehends key points      Outcomes   Program Status Completed             Learning Objective:  Patient will have a greater understanding of diabetes self-management. Patient education plan is to attend individual and/or group sessions per assessed needs and concerns.   Plan:   Patient Instructions  Return to limiting evening snacks on days when eating a later dinner. When eating an early dinner, plan to have a healthy snack 3-4 hours afterwards, like yogurt (can mix with some peanut butter and honey) with 1-2 graham crackers, or popcorn low in salt and butter Find ways to add veggies into other foods ie pasta, or a few raw veggies and small amount of dip or hummus.  Include a simple light meal midday such as whole grain crackers or bread/ pita chips with low fat cheese, hummus, or low sodium turkey/ chicken Find some simple chair exercises or consider an under-chair pedaller to gradually increase physical activity to help with weight loss and blood sugar control   Expected  Outcomes:  Demonstrated interest in learning. Expect positive outcomes  Education material provided: visit summary with goals/ instructions  If problems or questions, patient to contact team via:  Phone and patient portal  Future DSME appointment: - PRN

## 2023-03-22 ENCOUNTER — Other Ambulatory Visit: Payer: Self-pay | Admitting: Internal Medicine

## 2023-03-22 DIAGNOSIS — K219 Gastro-esophageal reflux disease without esophagitis: Secondary | ICD-10-CM

## 2023-03-22 MED ORDER — CLONAZEPAM 0.5 MG PO TABS
0.5000 mg | ORAL_TABLET | Freq: Every day | ORAL | 0 refills | Status: DC | PRN
Start: 2023-03-22 — End: 2023-07-14

## 2023-03-22 MED ORDER — HYDROCORTISONE (PERIANAL) 2.5 % EX CREA: 1.0000 | TOPICAL_CREAM | Freq: Two times a day (BID) | CUTANEOUS | 0 refills | Status: AC

## 2023-03-22 MED ORDER — TRAMADOL HCL 50 MG PO TABS: 50.0000 mg | ORAL_TABLET | Freq: Every day | ORAL | 0 refills | Status: AC | PRN

## 2023-03-22 MED ORDER — METHOCARBAMOL 500 MG PO TABS: 500.0000 mg | ORAL_TABLET | Freq: Three times a day (TID) | ORAL | 0 refills | Status: DC | PRN

## 2023-03-23 ENCOUNTER — Telehealth: Payer: Self-pay

## 2023-03-23 NOTE — Telephone Encounter (Signed)
Diana [name] [facility] (KeyAnice Paganini) PA Case ID #: [id number] Rx #: [id number] Need Help? Call us at [phone] Status sent iconSent to Plan today Drug traMADol HCl 50MG  tablets ePA cloud logo Form [employer] Medicaid Electronic Prior Authorization Form Original Claim Info 7X Days Supply Exceeds Plan Limitation

## 2023-03-23 NOTE — Telephone Encounter (Signed)
General.Closed by health plan.We thank you for taking the time to submit your request. However, this member has alternative pharmacy benefits. [employer] [place] is the payer of last resort. Please have the pharmacy bill the member's other insurance plan first. If the member feels that this is in error, please have the member call Member Services at [phone]. If the requested medication is not covered or was denied by the [employer], an explanation of benefits or proof of denial must be submitted with the request prior to coverage by [facility].

## 2023-03-24 ENCOUNTER — Ambulatory Visit: Payer: No Typology Code available for payment source | Admitting: Podiatry

## 2023-03-25 ENCOUNTER — Encounter: Payer: Self-pay | Admitting: Internal Medicine

## 2023-03-25 ENCOUNTER — Other Ambulatory Visit (HOSPITAL_COMMUNITY)
Admission: RE | Admit: 2023-03-25 | Discharge: 2023-03-25 | Disposition: A | Discharge status: Home / Self Care | Payer: No Typology Code available for payment source | Source: Ambulatory Visit | Attending: Internal Medicine | Admitting: Internal Medicine

## 2023-03-25 ENCOUNTER — Ambulatory Visit: Payer: Medicaid Other | Admitting: Internal Medicine

## 2023-03-25 VITALS — BP 122/78 | Ht 67.0 in | Wt 235.4 lb

## 2023-03-25 DIAGNOSIS — R310 Gross hematuria: Secondary | ICD-10-CM

## 2023-03-25 DIAGNOSIS — R102 Pelvic and perineal pain: Secondary | ICD-10-CM

## 2023-03-25 DIAGNOSIS — R300 Dysuria: Secondary | ICD-10-CM | POA: Insufficient documentation

## 2023-03-25 LAB — POCT URINALYSIS DIPSTICK
Bilirubin, UA: NEGATIVE
Blood, UA: NEGATIVE
Glucose, UA: NEGATIVE
Ketones, UA: NEGATIVE
Leukocytes, UA: NEGATIVE
Nitrite, UA: NEGATIVE
Protein, UA: NEGATIVE
Spec Grav, UA: <=1.005 — AB (ref 1.010–1.025)
Urobilinogen, UA: 0.2 U/dL
pH, UA: 7.5 (ref 5.0–8.0)

## 2023-03-25 NOTE — Patient Instructions (Signed)
Dysuria Dysuria is pain or discomfort during urination. The pain or discomfort may be felt in the part of the body that drains urine from the bladder (urethra) or in the surrounding tissue of the genitals. The pain may also be felt in the groin area, lower abdomen, or lower back. You may have to urinate frequently or have the sudden feeling that you have to urinate (urgency). Dysuria can affect anyone, but it is more common in females. Dysuria can be caused by many different things, including: Urinary tract infection. Kidney stones or bladder stones. Certain STIs (sexually transmitted infections), such as chlamydia. Dehydration. Inflammation of the tissues of the vagina. Use of certain medicines. Use of certain soaps or scented products that cause irritation. Follow these instructions at home: Medicines Take over-the-counter and prescription medicines only as told by your health care provider. If you were prescribed an antibiotic medicine, take it as told by your health care provider. Do not stop taking the antibiotic even if you start to feel better. Eating and drinking  Drink enough fluid to keep your urine pale yellow. Avoid caffeinated beverages, tea, and alcohol. These beverages can irritate the bladder and make dysuria worse. In males, alcohol may irritate the prostate. General instructions Watch your condition for any changes. Urinate often. Avoid holding urine for long periods of time. If you are female, you should wipe from front to back after urinating or having a bowel movement. Use each piece of toilet paper only once. Empty your bladder after sex. Keep all follow-up visits. This is important. If you had any tests done to find the cause of dysuria, it is up to you to get your test results. Ask your health care provider, or the department that is doing the test, when your results will be ready. Contact a health care provider if: You have a fever. You develop pain in your back or  sides. You have nausea or vomiting. You have blood in your urine. You are not urinating as often as you usually do. Get help right away if: Your pain is severe and not relieved with medicines. You cannot eat or drink without vomiting. You are confused. You have a rapid heartbeat while resting. You have shaking or chills. You feel extremely weak. Summary Dysuria is pain or discomfort while urinating. Many different conditions can lead to dysuria. If you have dysuria, you may have to urinate frequently or have the sudden feeling that you have to urinate (urgency). Watch your condition for any changes. Keep all follow-up visits. Make sure that you urinate often and drink enough fluid to keep your urine pale yellow. This information is not intended to replace advice given to you by your health care provider. Make sure you discuss any questions you have with your health care provider. Document Revised: 11/17/2019 Document Reviewed: 11/17/2019 Elsevier Patient Education  2024 Elsevier Inc.  

## 2023-03-25 NOTE — Progress Notes (Signed)
HPI   Discussed the use of AI scribe software for clinical note transcription with the patient, who gave verbal consent to proceed.  The patient, with a history of hysterectomy due to fibroids five years ago, presents with dysuria that started on Tuesday. She describes the sensation as a burning pain during urination, which persists for a short period post-voiding before subsiding. She also noticed two small specks of blood in her urine on Tuesday night, although she is unsure if the bleeding was vaginal or urinary in origin. She denies any recent sexual activity, vaginal discharge, or itching. She also reports chronic back pain, but denies any new or worsening pelvic pressure or low back pain. The patient has been managing her symptoms by increasing her water intake.      Review of Systems  Past Medical History:  Diagnosis Date   Allergy    Ear infection    recurrent   Hypertension     Family History  Problem Relation Age of Onset   Hypertension Mother        alive   Arthritis Mother    Thyroid disease Mother    Lung cancer Father        deceased   Heart disease Father    Hypertension Maternal Grandmother     Social History   Socioeconomic History   Marital status: Widowed    Spouse name: Not on file   Number of children: 3   Years of education: Not on file   Highest education level: 12th grade  Occupational History   Occupation: customer service    Employer: [employer]  Tobacco Use   Smoking status: Former    Current packs/day: 0.00    Types: Cigarettes    Quit date: 04/20/1985    Years since quitting: 37.9    Passive exposure: Past   Smokeless tobacco: Never   Tobacco comments:    remote tobacco use   Vaping Use   Vaping status: Never Used  Substance and Sexual Activity   Alcohol use: No    Alcohol/week: 0.0 standard drinks of alcohol   Drug use: No   Sexual activity: Not Currently  Other Topics Concern   Not on file  Social History Narrative   Not on  file   Social Determinants of Health   Financial Resource Strain: Low Risk  (10/26/2022)   Overall Financial Resource Strain (CARDIA)    Difficulty of Paying Living Expenses: Not very hard  Food Insecurity: No Food Insecurity (10/26/2022)   Hunger Vital Sign    Worried About Running Out of Food in the Last Year: Never true    Ran Out of Food in the Last Year: Never true  Transportation Needs: No Transportation Needs (10/26/2022)   PRAPARE - Administrator, Civil Service (Medical): No    Lack of Transportation (Non-Medical): No  Physical Activity: Unknown (10/26/2022)   Exercise Vital Sign    Days of Exercise per Week: 0 days    Minutes of Exercise per Session: Not on file  Stress: No Stress Concern Present (10/26/2022)   [employer] of Occupational Health - Occupational Stress Questionnaire    Feeling of Stress : Not at all  Social Connections: Socially Isolated (10/26/2022)   Social Connection and Isolation Panel [NHANES]    Frequency of Communication with Friends and Family: More than three times a week    Frequency of Social Gatherings with Friends and Family: Once a week    Attends Religious Services:  Never    Active Member of Clubs or Organizations: No    Attends Banker Meetings: Not on file    Marital Status: Widowed  Intimate Partner Violence: Not on file    No Known Allergies   Constitutional: Denies fever, malaise, fatigue, headache or abrupt weight changes.   GU: Pt reports pelvic pressure, dysuria, blood in her urine. Denies urgency, frequency, burning sensation, odor or discharge. Skin: Denies redness, rashes, lesions or ulcercations.   No other specific complaints in a complete review of systems (except as listed in HPI above).    Objective:   Physical Exam BP 122/78   Ht 5\' 7"  (1.702 m)   Wt 235 lb 6.4 oz (106.8 kg)   LMP 01/20/2016   BMI 36.87 kg/m   Wt Readings from Last 3 Encounters:  03/15/23 232 lb 4.8 oz (105.4 kg)  02/02/23  232 lb (105.2 kg)  02/01/23 231 lb 6.4 oz (105 kg)    General: Appears her stated age, obese, in NAD. Cardiovascular: Normal rate and rhythm. S1,S2 noted.   Pulmonary/Chest: Normal effort and positive vesicular breath sounds. No respiratory distress. No wheezes, rales or ronchi noted.  Abdomen: Soft. Normal bowel sounds. No distention or masses noted.  Tender to palpation over the bladder area. No CVA tenderness.        Assessment & Plan:   Pelvic pressure, dysuria and blood in urine:  Urinalysis: negative Will send wet prep to check for BV/yeast, gonorrhea, chlamydia and trichomonas Drink plenty of fluids  RTC in 1 month for follow-up of chronic conditions [name], NP

## 2023-03-26 LAB — CERVICOVAGINAL ANCILLARY ONLY
Bacterial Vaginitis (gardnerella): POSITIVE — AB
Candida Glabrata: NEGATIVE
Candida Vaginitis: NEGATIVE
Chlamydia: NEGATIVE
Comment: NEGATIVE
Comment: NEGATIVE
Comment: NEGATIVE
Comment: NEGATIVE
Comment: NEGATIVE
Comment: NORMAL
Neisseria Gonorrhea: NEGATIVE
Trichomonas: NEGATIVE

## 2023-03-26 MED ORDER — METRONIDAZOLE 500 MG PO TABS: 500.0000 mg | ORAL_TABLET | Freq: Two times a day (BID) | ORAL | 0 refills | Status: DC

## 2023-03-26 NOTE — Addendum Note (Signed)
Addended by: [name] on: 03/26/2023 02:36 PM   Modules accepted: Orders

## 2023-03-31 ENCOUNTER — Other Ambulatory Visit: Payer: Self-pay | Admitting: Physician Assistant

## 2023-03-31 ENCOUNTER — Other Ambulatory Visit: Payer: Self-pay | Admitting: Orthopedic Surgery

## 2023-03-31 DIAGNOSIS — M48061 Spinal stenosis, lumbar region without neurogenic claudication: Secondary | ICD-10-CM

## 2023-03-31 DIAGNOSIS — M5416 Radiculopathy, lumbar region: Secondary | ICD-10-CM

## 2023-03-31 DIAGNOSIS — M47816 Spondylosis without myelopathy or radiculopathy, lumbar region: Secondary | ICD-10-CM

## 2023-04-05 ENCOUNTER — Ambulatory Visit: Payer: Medicaid Other | Admitting: Physician Assistant

## 2023-04-05 ENCOUNTER — Ambulatory Visit: Payer: Self-pay | Admitting: *Deleted

## 2023-04-05 VITALS — BP 126/70 | HR 82 | Temp 98.2°F | Resp 16 | Ht 67.0 in | Wt 235.0 lb

## 2023-04-05 DIAGNOSIS — J069 Acute upper respiratory infection, unspecified: Secondary | ICD-10-CM

## 2023-04-05 NOTE — Telephone Encounter (Signed)
Summary: sinus discomfort / rx req   The patient would like to speak with a member of clinical staff about their sinus discomfort that they've experienced for nearly 1 week  The patient has tested negative for COVID and would like to be prescribed something for their symptoms  Please contact the patient further when possible     Reason for Disposition  [1] SEVERE pain AND [2] not improved 2 hours after pain medicine  Answer Assessment - Initial Assessment Questions 1. LOCATION: "Where does it hurt?"      Nasal congestion/pressure- unable to breath when laying flat 2. ONSET: "When did the sinus pain start?"  (e.g., hours, days)      Saturday 3. SEVERITY: "How bad is the pain?"   (Scale 1-10; mild, moderate or severe)   - MILD (1-3): doesn't interfere with normal activities    - MODERATE (4-7): interferes with normal activities (e.g., work or school) or awakens from sleep   - SEVERE (8-10): excruciating pain and patient unable to do any normal activities        Mild/moderate 4. RECURRENT SYMPTOM: "Have you ever had sinus problems before?" If Yes, ask: "When was the last time?" and "What happened that time?"      Yes- 1 year- usually experience this- once a year 5. NASAL CONGESTION: "Is the nose blocked?" If Yes, ask: "Can you open it or must you breathe through your mouth?"     Yes- patient is completely blocked 6. NASAL DISCHARGE: "Do you have discharge from your nose?" If so ask, "What color?"     One time this morning- yellow/clear 7. FEVER: "Do you have a fever?" If Yes, ask: "What is it, how was it measured, and when did it start?"      no 8. OTHER SYMPTOMS: "Do you have any other symptoms?" (e.g., sore throat, cough, earache, difficulty breathing)     Saturday am - sore throat  Protocols used: Sinus Pain or Congestion-A-AH

## 2023-04-05 NOTE — Progress Notes (Signed)
Acute Office Visit   Patient: Diana [name]   DOB: 07-01-1960   62 y.o. Female  MRN: [id number] Visit Date: 04/05/2023  Today's healthcare provider: Oswaldo Conroy Amayiah Gosnell, PA-C  Introduced myself to the patient as a Secondary school teacher and provided education on APPs in clinical practice.    Chief Complaint  Patient presents with   Sinusitis    x3 days, getting worse.    Subjective    HPI HPI     Sinusitis    Additional comments: x3 days, getting worse.       Last edited by Dollene Primrose, CMA on 04/05/2023  9:46 AM.      URI -type symptoms  Onset: sudden -started with postnasal drainage  Duration: ongoing for 3 days with worsening symptoms (started on 12/14) Associated symptoms: postnasal drainage, congestion, sore throat,dry cough, intermittent loss of voice   Intervention: allergy medication and Nasacort  Recent sick contacts: she denies recent sick contacts  Recent travel: none  COVID testing at home: she tested on Sat   Result: negative       Medications: Outpatient Medications Prior to Visit  Medication Sig   Ascorbic Acid 125 MG CHEW Chew 1 each by mouth daily.   atorvastatin (LIPITOR) 10 MG tablet Take 1 tablet by mouth once daily   cetirizine (ZYRTEC) 10 MG tablet Take 10 mg by mouth daily as needed.   Cholecalciferol (VITAMIN D3) 25 MCG (1000 UT) CAPS Take 5,000 Units by mouth.    clonazePAM (KLONOPIN) 0.5 MG tablet Take 1 tablet (0.5 mg total) by mouth daily as needed. for anxiety   gabapentin (NEURONTIN) 300 MG capsule TAKE 1 CAPSULE (300 MG TOTAL) BY MOUTH AT BEDTIME.   hydrocortisone (ANUSOL-HC) 2.5 % rectal cream Place 1 Application rectally 2 (two) times daily.   ibuprofen (ADVIL) 200 MG tablet Take 200 mg by mouth every 6 (six) hours as needed.   losartan-hydrochlorothiazide (HYZAAR) 50-12.5 MG tablet Take 1 tablet by mouth once daily   methocarbamol (ROBAXIN) 500 MG tablet Take 1 tablet (500 mg total) by mouth every 8 (eight) hours as needed for muscle  spasms.   metroNIDAZOLE (FLAGYL) 500 MG tablet Take 1 tablet (500 mg total) by mouth 2 (two) times daily. Do not drink alcohol while taking this medicine.   Multiple Vitamin (MULTIVITAMIN) LIQD Take 5 mLs by mouth daily.   omeprazole (PRILOSEC) 40 MG capsule TAKE 1 CAPSULE BY MOUTH 2 TIMES DAILY.   traMADol (ULTRAM) 50 MG tablet Take 1 tablet (50 mg total) by mouth daily as needed.   traZODone (DESYREL) 50 MG tablet Take 0.5-1 tablets (25-50 mg total) by mouth at bedtime as needed for sleep.   Turmeric (QC TUMERIC COMPLEX) 500 MG CAPS Take by mouth.   UNABLE TO FIND Med Name: Cornell Barman supplement   UNABLE TO FIND Med Name: Alric Quan Mind supplement for cognitive function   No facility-administered medications prior to visit.    Review of Systems  Constitutional:  Negative for chills, diaphoresis, fatigue and fever.  HENT:  Positive for congestion, postnasal drip, sinus pressure, sinus pain, sneezing, sore throat and voice change. Negative for ear pain.   Respiratory:  Positive for cough. Negative for shortness of breath.   Gastrointestinal:  Negative for diarrhea, nausea and vomiting.  Musculoskeletal:  Negative for myalgias.  Neurological:  Negative for dizziness, light-headedness and headaches.        Objective    BP 126/70   Pulse 82  Temp 98.2 F (36.8 C) (Oral)   Resp 16   Ht 5\' 7"  (1.702 m)   Wt 235 lb (106.6 kg)   LMP 01/20/2016   BMI 36.81 kg/m     Physical Exam Vitals reviewed.  Constitutional:      General: She is awake.     Appearance: Normal appearance. She is well-developed and well-groomed.  HENT:     Head: Normocephalic and atraumatic.     Right Ear: Hearing and ear canal normal. A middle ear effusion is present.     Left Ear: Hearing and ear canal normal. A middle ear effusion is present.     Mouth/Throat:     Lips: Pink.     Mouth: Mucous membranes are moist.     Pharynx: Uvula midline. No pharyngeal swelling, oropharyngeal exudate,  posterior oropharyngeal erythema, uvula swelling or postnasal drip.  Eyes:     General: Lids are normal. Gaze aligned appropriately.  Cardiovascular:     Rate and Rhythm: Normal rate and regular rhythm.     Pulses:          Radial pulses are 2+ on the right side and 2+ on the left side.     Heart sounds: Normal heart sounds.  Pulmonary:     Effort: Pulmonary effort is normal.     Breath sounds: Normal breath sounds. No decreased air movement. No decreased breath sounds, wheezing, rhonchi or rales.  Musculoskeletal:     Cervical back: Normal range of motion and neck supple.  Lymphadenopathy:     Head:     Right side of head: No submental or submandibular adenopathy.     Left side of head: Submandibular adenopathy present. No submental adenopathy.     Cervical:     Right cervical: No superficial cervical adenopathy.    Left cervical: No superficial cervical adenopathy.     Upper Body:     Right upper body: No supraclavicular adenopathy.     Left upper body: No supraclavicular adenopathy.  Neurological:     Mental Status: She is alert.  Psychiatric:        Behavior: Behavior is cooperative.       No results found for any visits on 04/05/23.  Assessment & Plan      No follow-ups on file.        Problem List Items Addressed This Visit   None Visit Diagnoses       Upper respiratory tract infection, unspecified type    -  Primary     Acute, new concern Visit with patient indicates symptoms comprised of postnasal drainage, congestion, sore throat, sneezing since Sat congruent with acute URI that is likely viral in nature  Patient has tested negative for COVID  Due to nature and duration of symptoms recommended treatment regimen is symptomatic relief and follow up if needed Discussed with patient the various viral and bacterial etiologies of current illness and appropriate course of treatment Discussed OTC medication options for multisymptom relief such as Dayquil/Nyquil,  Theraflu, AlkaSeltzer, etc. And HTN recommendations of Robitussin, Mucinex, intranasal steroid, antihistamines  Discussed return precautions if symptoms are not improving or worsen over next 5-7 days.     No follow-ups on file.   I, Analiah Drum E Gael Delude, PA-C, have reviewed all documentation for this visit. The documentation on 04/05/23 for the exam, diagnosis, procedures, and orders are all accurate and complete.   Jacquelin Hawking, MHS, PA-C [facility]

## 2023-04-05 NOTE — Patient Instructions (Signed)

## 2023-04-05 NOTE — Telephone Encounter (Signed)
  Chief Complaint: sinus pressure/pain Symptoms: nasal congestion, sore throat Frequency: Started Saturday Pertinent Negatives: Patient denies fever Disposition: [] ED /[] Urgent Care (no appt availability in office) / [x] Appointment(In office/virtual)/ []  [place] [facility]/ [] Home Care/ [] Refused Recommended Disposition /[] [place] [facility]/ []  Follow-up with PCP Additional Notes: No open slots- patient scheduled with float provider.

## 2023-04-12 ENCOUNTER — Encounter: Payer: Self-pay | Admitting: Internal Medicine

## 2023-04-12 NOTE — Telephone Encounter (Signed)
 Care team updated and letter sent for eye exam notes.

## 2023-04-18 IMAGING — CR DG CHEST 2V
2 series · 2 of 2 positions shown · non-contrast
Comparison: August 22, 2020.

CLINICAL DATA: Chest pain in a 60-year-old female.

EXAM:
CHEST - 2 VIEW

[chest pa]
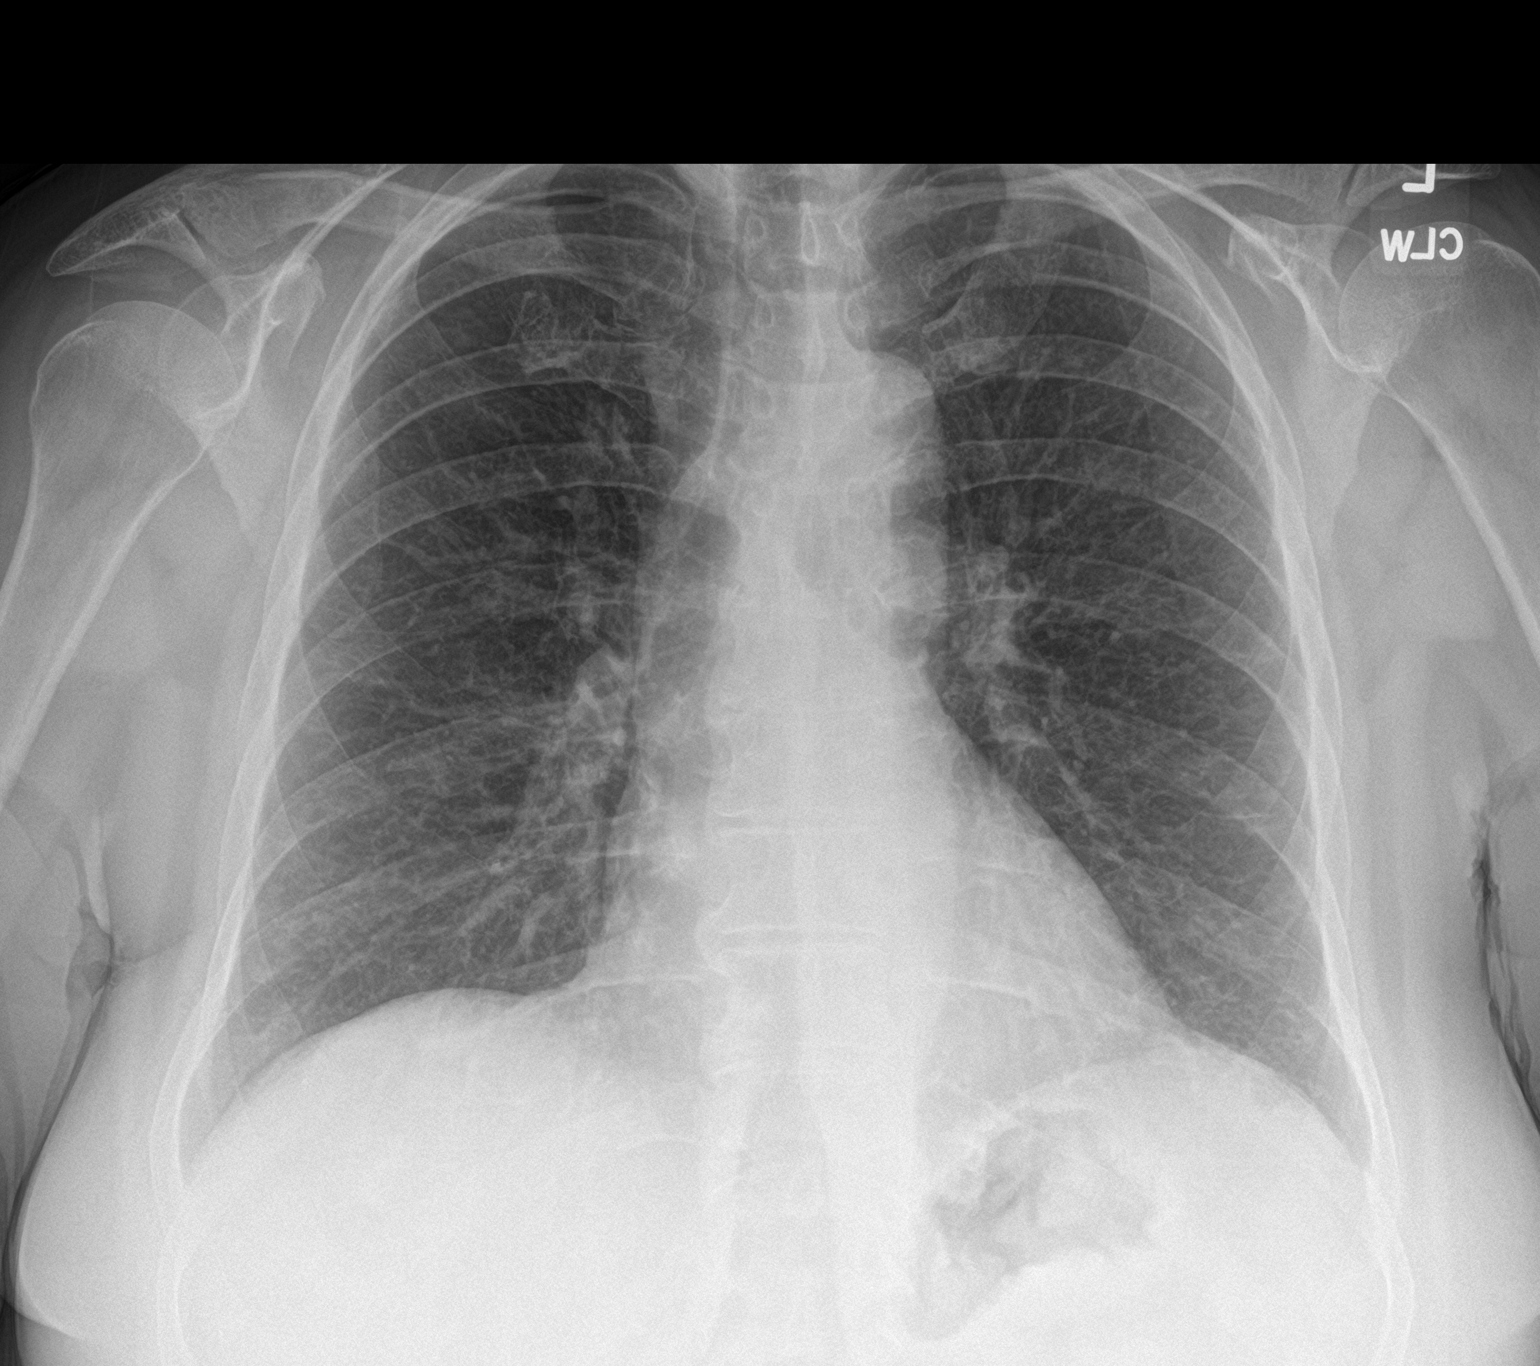

[chest lat]
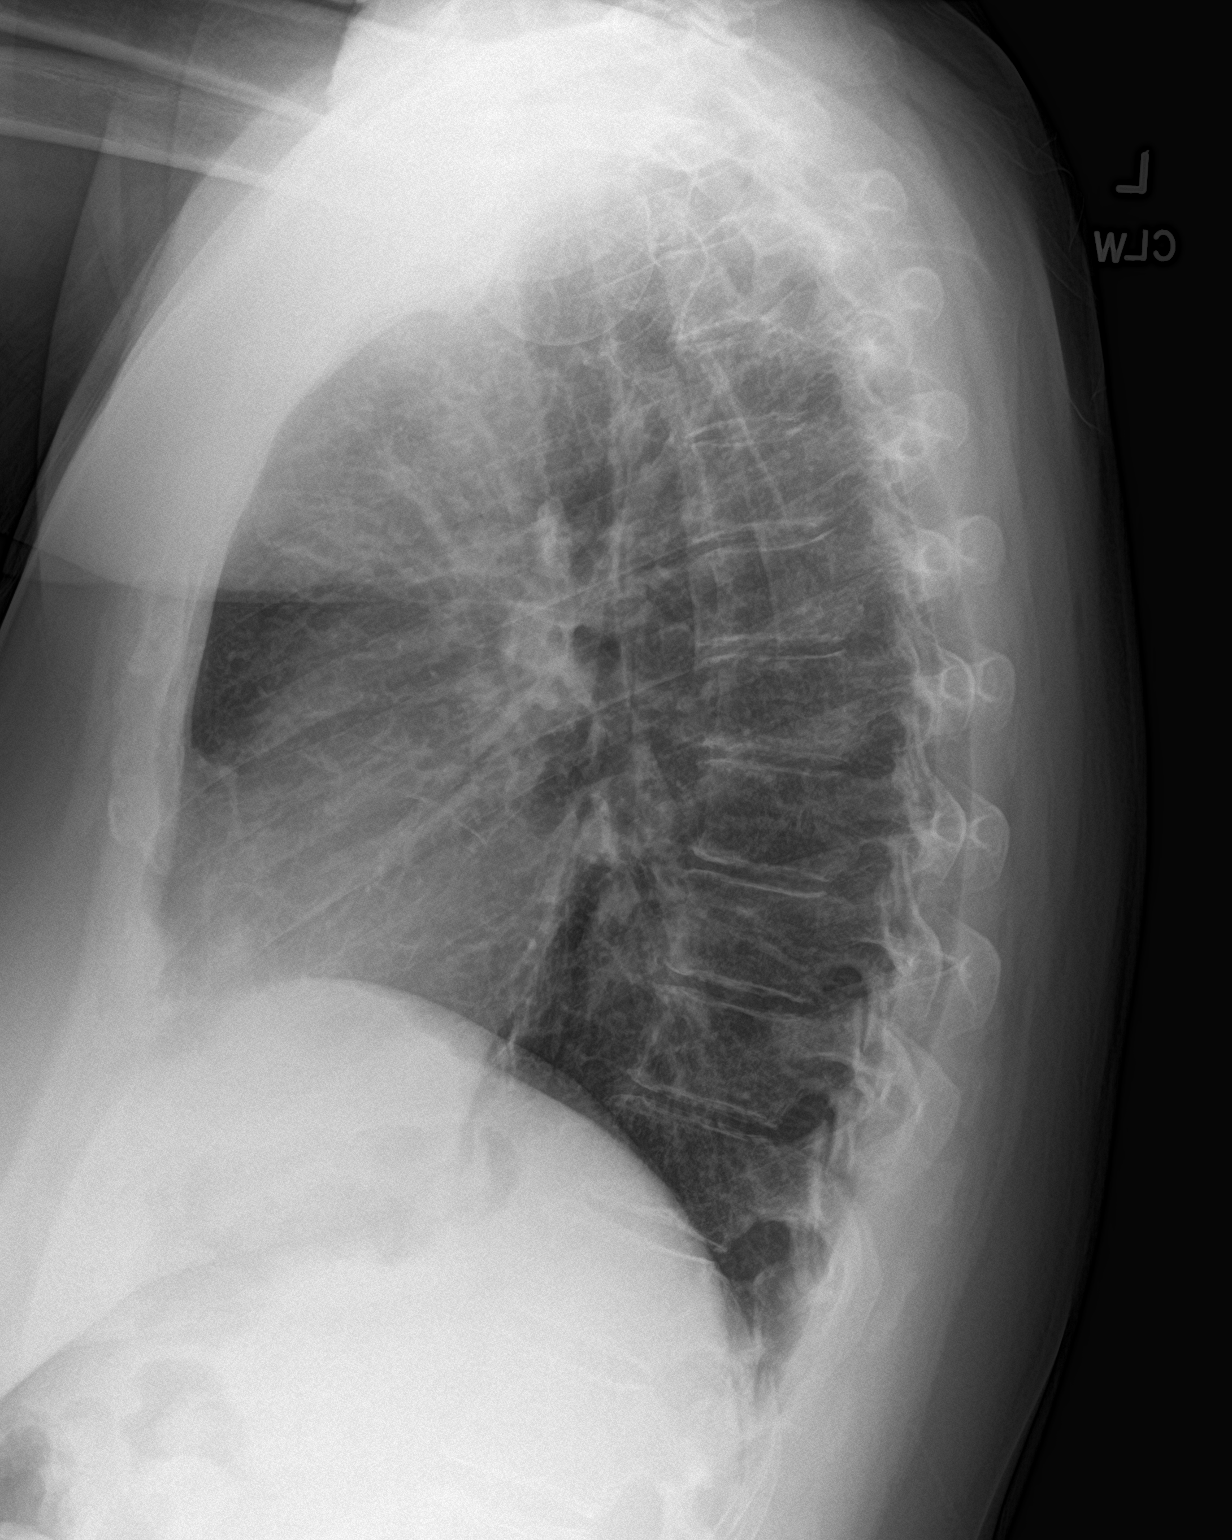

[2 of 2 positions shown; findings below may reference images not displayed]

FINDINGS: The heart size and mediastinal contours are within normal limits.
Both lungs are clear. The visualized skeletal structures are
unremarkable.
IMPRESSION: No active cardiopulmonary disease.

## 2023-04-23 ENCOUNTER — Encounter: Payer: No Typology Code available for payment source | Admitting: Neurology

## 2023-04-26 ENCOUNTER — Telehealth: Payer: Self-pay

## 2023-04-26 NOTE — Telephone Encounter (Signed)
  Appointment Request From: Donzell JULIANNA Sable   With Provider: Corinn [place] [facility] [place]]   Preferred Date Range: 06/28/2023 - 07/05/2023   Preferred Times: Any Time   Reason for visit: Office Visit   Health Maintenance Topic:    Comments: Re check on liver

## 2023-04-26 NOTE — Telephone Encounter (Signed)
 Schedule patient for 06/29/2023 at 2:15pm  in [place] but patient insurance is out of network for [employer]. Called patient and she states to her knowledge she does not have the [employer] only has medicaid. She will still come to our office. She states she wants to come to [place] though. Reschedule to 06/30/2023

## 2023-05-03 ENCOUNTER — Ambulatory Visit: Payer: BC Managed Care – PPO | Admitting: Internal Medicine

## 2023-05-18 ENCOUNTER — Other Ambulatory Visit: Payer: Self-pay | Admitting: Internal Medicine

## 2023-05-18 DIAGNOSIS — H5203 Hypermetropia, bilateral: Secondary | ICD-10-CM | POA: Diagnosis not present

## 2023-05-18 DIAGNOSIS — R102 Pelvic and perineal pain: Secondary | ICD-10-CM

## 2023-05-18 DIAGNOSIS — H5213 Myopia, bilateral: Secondary | ICD-10-CM | POA: Diagnosis not present

## 2023-05-20 NOTE — Telephone Encounter (Signed)
Requested Prescriptions  Pending Prescriptions Disp Refills   omeprazole (PRILOSEC) 40 MG capsule [Pharmacy Med Name: OMEPRAZOLE 40 MG CPDR 40 Capsule] 60 capsule 1    Sig: TAKE 1 CAPSULE BY MOUTH 2 TIMES DAILY.     Gastroenterology: Proton Pump Inhibitors Passed - 05/20/2023 11:11 AM      Passed - Valid encounter within last 12 months    Recent Outpatient Visits           1 month ago Upper respiratory tract infection, unspecified type   [facility] M[place]um, Oswaldo Conroy, PA-C   1 month ago Dysuria   [facility] [place], [place] W, NP   3 months ago Gastroesophageal reflux disease without esophagitis   [facility] [place], [place], NP   4 months ago NAFLD (nonalcoholic fatty liver disease)   [facility] Milligan, Salvadore Oxford, NP   5 months ago Medication side effect   [facility] [place], Salvadore Oxford, NP       Future Appointments             Tomorrow Sampson Si, Salvadore Oxford, NP [facility]   In 1 month Vanga, Loel Dubonnet, MD [facility] [place] [facility] at [facility]

## 2023-05-21 ENCOUNTER — Ambulatory Visit: Payer: Medicaid Other | Admitting: Internal Medicine

## 2023-05-21 ENCOUNTER — Encounter: Payer: Self-pay | Admitting: Internal Medicine

## 2023-05-21 VITALS — BP 122/74 | Ht 67.0 in | Wt 239.8 lb

## 2023-05-21 DIAGNOSIS — M7662 Achilles tendinitis, left leg: Secondary | ICD-10-CM | POA: Insufficient documentation

## 2023-05-21 DIAGNOSIS — B181 Chronic viral hepatitis B without delta-agent: Secondary | ICD-10-CM

## 2023-05-21 DIAGNOSIS — E1169 Type 2 diabetes mellitus with other specified complication: Secondary | ICD-10-CM

## 2023-05-21 DIAGNOSIS — F32A Depression, unspecified: Secondary | ICD-10-CM

## 2023-05-21 DIAGNOSIS — E66812 Obesity, class 2: Secondary | ICD-10-CM | POA: Diagnosis not present

## 2023-05-21 DIAGNOSIS — E119 Type 2 diabetes mellitus without complications: Secondary | ICD-10-CM | POA: Diagnosis not present

## 2023-05-21 DIAGNOSIS — G8929 Other chronic pain: Secondary | ICD-10-CM | POA: Diagnosis not present

## 2023-05-21 DIAGNOSIS — F5102 Adjustment insomnia: Secondary | ICD-10-CM

## 2023-05-21 DIAGNOSIS — Z6837 Body mass index (BMI) 37.0-37.9, adult: Secondary | ICD-10-CM

## 2023-05-21 DIAGNOSIS — K219 Gastro-esophageal reflux disease without esophagitis: Secondary | ICD-10-CM | POA: Diagnosis not present

## 2023-05-21 DIAGNOSIS — G4700 Insomnia, unspecified: Secondary | ICD-10-CM | POA: Insufficient documentation

## 2023-05-21 DIAGNOSIS — E785 Hyperlipidemia, unspecified: Secondary | ICD-10-CM

## 2023-05-21 DIAGNOSIS — F419 Anxiety disorder, unspecified: Secondary | ICD-10-CM

## 2023-05-21 DIAGNOSIS — M5441 Lumbago with sciatica, right side: Secondary | ICD-10-CM | POA: Diagnosis not present

## 2023-05-21 DIAGNOSIS — I10 Essential (primary) hypertension: Secondary | ICD-10-CM

## 2023-05-21 NOTE — Assessment & Plan Note (Signed)
A1c today Urine micro albumin has been checked within the last year Encourage low-carb diet and exercise for weight loss Not medicated Encourage routine eye exam Encouraged foot exam She declines immunizations

## 2023-05-21 NOTE — Progress Notes (Signed)
Subjective:    Patient ID: Diana [name], female    DOB: [birth year], 63 y.o.   MRN: [id number]  HPI  Patient presents to clinic today for follow-up of chronic conditions.  HTN: Her BP today is 122/74.  She is taking losartan HCT as prescribed.  ECG from 06/2021 reviewed.  Anxiety: Persistent.  Managed with clonazepam as needed.  She is not currently seeing a therapist.  She denies depression, SI/HI.  GERD: She is not sure what triggers this.  She denies breakthrough on omeprazole.  There is no upper GI on file.  Chronic hep B: In remission status post antiviral therapy.  She follows with GI.  Chronic low back pain with right-sided sciatica: Managed with methocarbamol and gabapentin.  MRI from 08/2022 reviewed.  She is following with neurosurgery.  DM2: Her last A1c was 6.7%, 10/2022.  She is not taking any oral diabetic medication at this time.  She does not check her sugars.  She checks her feet routinely.  Her last eye exam was 04/2023.  Flu never.  Pneumovax never.  COVID never.  HLD: Her last LDL was 92, triglycerides 213, 10/2022.  She denies myalgias on atorvastatin 3 x week.  She does not consume low-fat diet.  Insomnia: She has difficulty falling asleep.  She is taking trazodone as prescribed.  Sleep study from 04/2022 reviewed.  Left Achilles tendinitis: She is not currently taking any medication for this.  She is no longer wearing her boot.  She would like a referral for second opinion.  She has seen Dr. Allena Katz [facility] in [place] in the past.  Review of Systems     Past Medical History:  Diagnosis Date   Allergy    Ear infection    recurrent   Hypertension     Current Outpatient Medications  Medication Sig Dispense Refill   Ascorbic Acid 125 MG CHEW Chew 1 each by mouth daily.     atorvastatin (LIPITOR) 10 MG tablet Take 1 tablet by mouth once daily 90 tablet 1   cetirizine (ZYRTEC) 10 MG tablet Take 10 mg by mouth daily as needed.     Cholecalciferol  (VITAMIN D3) 25 MCG (1000 UT) CAPS Take 5,000 Units by mouth.      clonazePAM (KLONOPIN) 0.5 MG tablet Take 1 tablet (0.5 mg total) by mouth daily as needed. for anxiety 30 tablet 0   gabapentin (NEURONTIN) 300 MG capsule TAKE 1 CAPSULE (300 MG TOTAL) BY MOUTH AT BEDTIME. 30 capsule 1   hydrocortisone (ANUSOL-HC) 2.5 % rectal cream Place 1 Application rectally 2 (two) times daily. 30 g 0   ibuprofen (ADVIL) 200 MG tablet Take 200 mg by mouth every 6 (six) hours as needed.     losartan-hydrochlorothiazide (HYZAAR) 50-12.5 MG tablet Take 1 tablet by mouth once daily 90 tablet 0   methocarbamol (ROBAXIN) 500 MG tablet Take 1 tablet (500 mg total) by mouth every 8 (eight) hours as needed for muscle spasms. 30 tablet 0   metroNIDAZOLE (FLAGYL) 500 MG tablet Take 1 tablet (500 mg total) by mouth 2 (two) times daily. Do not drink alcohol while taking this medicine. 14 tablet 0   Multiple Vitamin (MULTIVITAMIN) LIQD Take 5 mLs by mouth daily.     omeprazole (PRILOSEC) 40 MG capsule TAKE 1 CAPSULE BY MOUTH 2 TIMES DAILY. 60 capsule 1   traZODone (DESYREL) 50 MG tablet Take 0.5-1 tablets (25-50 mg total) by mouth at bedtime as needed for sleep. 30 tablet 0  Turmeric (QC TUMERIC COMPLEX) 500 MG CAPS Take by mouth.     UNABLE TO FIND Med Name: Cornell Barman supplement     UNABLE TO FIND Med Name: Alric Quan Mind supplement for cognitive function     No current facility-administered medications for this visit.    No Known Allergies  Family History  Problem Relation Age of Onset   Hypertension Mother        alive   Arthritis Mother    Thyroid disease Mother    Lung cancer Father        deceased   Heart disease Father    Hypertension Maternal Grandmother     Social History   Socioeconomic History   Marital status: Widowed    Spouse name: Not on file   Number of children: 3   Years of education: Not on file   Highest education level: 12th grade  Occupational History   Occupation:  customer service    Employer: [employer]  Tobacco Use   Smoking status: Former    Current packs/day: 0.00    Types: Cigarettes    Quit date: 04/20/1985    Years since quitting: 38.1    Passive exposure: Past   Smokeless tobacco: Never   Tobacco comments:    remote tobacco use   Vaping Use   Vaping status: Never Used  Substance and Sexual Activity   Alcohol use: No    Alcohol/week: 0.0 standard drinks of alcohol   Drug use: No   Sexual activity: Not Currently  Other Topics Concern   Not on file  Social History Narrative   Not on file   Social Drivers of Health   Financial Resource Strain: Low Risk  (05/20/2023)   Overall Financial Resource Strain (CARDIA)    Difficulty of Paying Living Expenses: Not hard at all  Food Insecurity: No Food Insecurity (05/20/2023)   Hunger Vital Sign    Worried About Running Out of Food in the Last Year: Never true    Ran Out of Food in the Last Year: Never true  Transportation Needs: No Transportation Needs (05/20/2023)   PRAPARE - Administrator, Civil Service (Medical): No    Lack of Transportation (Non-Medical): No  Physical Activity: Unknown (05/20/2023)   Exercise Vital Sign    Days of Exercise per Week: 0 days    Minutes of Exercise per Session: Not on file  Stress: Stress Concern Present (05/20/2023)   [employer] of Occupational Health - Occupational Stress Questionnaire    Feeling of Stress : To some extent  Social Connections: Moderately Isolated (05/20/2023)   Social Connection and Isolation Panel [NHANES]    Frequency of Communication with Friends and Family: More than three times a week    Frequency of Social Gatherings with Friends and Family: Once a week    Attends Religious Services: Never    Database administrator or Organizations: No    Attends Engineer, structural: Not on file    Marital Status: Living with partner  Intimate Partner Violence: Not on file     Constitutional: Denies fever,  malaise, fatigue, headache or abrupt weight changes.  HEENT: Denies eye pain, eye redness, ear pain, ringing in the ears, wax buildup, runny nose, nasal congestion, bloody nose, or sore throat. Respiratory: Denies difficulty breathing, shortness of breath, cough or sputum production.   Cardiovascular: Denies chest pain, chest tightness, palpitations or swelling in the hands or feet.  Gastrointestinal: Pt reports RUQ  abdominal pain. Denies bloating, constipation, diarrhea or blood in the stool.  GU: Denies urgency, frequency, pain with urination, burning sensation, blood in urine, odor or discharge. Musculoskeletal: Patient reports chronic low back pain, left knee pain.  Denies decrease in range of motion, difficulty with gait, or joint swelling.  Skin: Denies redness, rashes, lesions or ulcercations.  Neurological: Patient reports insomnia.  Denies dizziness, difficulty with memory, difficulty with speech or problems with balance and coordination.  Psych: Patient has history of anxiety.  Denies depression, SI/HI.  No other specific complaints in a complete review of systems (except as listed in HPI above).  Objective:   Physical Exam  LMP 01/20/2016   Wt Readings from Last 3 Encounters:  04/05/23 235 lb (106.6 kg)  03/25/23 235 lb 6.4 oz (106.8 kg)  03/15/23 232 lb 4.8 oz (105.4 kg)    General: Appears her stated age, obese, in NAD. Skin: Warm, dry and intact. No ulcerations noted. HEENT: Head: normal shape and size; Eyes: sclera white, no icterus, conjunctiva pink, PERRLA and EOMs intact;   Cardiovascular: Normal rate and rhythm. S1,S2 noted.  No murmur, rubs or gallops noted. No JVD or BLE edema. No carotid bruits noted. Pulmonary/Chest: Normal effort and positive vesicular breath sounds. No respiratory distress. No wheezes, rales or ronchi noted.  Abdomen: Soft and nontender. Normal bowel sounds.  Musculoskeletal: Pain with palpation over the lumbar spine.  No pain or joint swelling  of the left knee noted.  No difficulty with gait.  Neurological: Alert and oriented. Coordination normal.  Psychiatric: Mood and affect normal. Behavior is normal. Judgment and thought content normal.    BMET    Component Value Date/Time   NA 142 10/26/2022 1547   K 3.7 10/26/2022 1547   CL 104 10/26/2022 1547   CO2 30 10/26/2022 1547   GLUCOSE 126 10/26/2022 1547   BUN 16 10/26/2022 1547   CREATININE 0.61 10/26/2022 1547   CALCIUM 9.0 10/26/2022 1547   GFRNONAA >60 07/02/2021 2000   GFRAA >60 07/27/2017 2153    Lipid Panel     Component Value Date/Time   CHOL 151 12/29/2022 1359   TRIG 109 12/29/2022 1359   HDL 18 (L) 10/26/2022 1547   CHOLHDL 7.4 (H) 10/26/2022 1547   VLDL 21.0 02/09/2019 1245   LDLCALC 92 10/26/2022 1547    CBC    Component Value Date/Time   WBC 5.7 10/26/2022 1547   RBC 4.24 10/26/2022 1547   HGB 12.9 10/26/2022 1547   HCT 38.8 10/26/2022 1547   PLT 237 10/26/2022 1547   MCV 91.5 10/26/2022 1547   MCH 30.4 10/26/2022 1547   MCHC 33.2 10/26/2022 1547   RDW 13.3 10/26/2022 1547   LYMPHSABS 1.1 07/28/2016 1232   MONOABS 0.4 07/28/2016 1232   EOSABS 0.0 07/28/2016 1232   BASOSABS 0.0 07/28/2016 1232    Hgb A1C Lab Results  Component Value Date   HGBA1C 6.7 (H) 10/26/2022           Assessment & Plan:     RTC in 6 months for your annual exam [name], NP

## 2023-05-21 NOTE — Assessment & Plan Note (Signed)
Stable on clonazepam as needed Will monitor

## 2023-05-21 NOTE — Assessment & Plan Note (Signed)
Avoid foods that trigger reflux Encourage weight loss as this can help reduce reflux symptoms Continue omeprazole 

## 2023-05-21 NOTE — Assessment & Plan Note (Signed)
Encourage regular stretching and core strengthening Consider PT if symptoms persist or worsen Continue methocarbamol and gabapentin as prescribed She will follow-up with neurosurgery as needed

## 2023-05-21 NOTE — Assessment & Plan Note (Signed)
C-Met and lipid profile today Encouraged her to consume a low-fat diet Continue atorvastatin 3 times weekly

## 2023-05-21 NOTE — Patient Instructions (Signed)

## 2023-05-21 NOTE — Assessment & Plan Note (Signed)
 Encourage diet and exercise for weight loss

## 2023-05-21 NOTE — Assessment & Plan Note (Signed)
Controlled on losartan HCT Reinforced DASH diet and exercise for weight loss C-Met today

## 2023-05-21 NOTE — Assessment & Plan Note (Signed)
Referral to podiatry placed for second opinion

## 2023-05-21 NOTE — Assessment & Plan Note (Signed)
Asymptomatic She has a second opinion with [facility] specialist in February

## 2023-05-21 NOTE — Assessment & Plan Note (Signed)
Continue trazodone as needed. 

## 2023-05-22 LAB — COMPLETE METABOLIC PANEL WITH GFR
AG Ratio: 1.8 (calc) (ref 1.0–2.5)
ALT: 13 U/L (ref 6–29)
AST: 14 U/L (ref 10–35)
Albumin: 4.5 g/dL (ref 3.6–5.1)
Alkaline phosphatase (APISO): 87 U/L (ref 37–153)
BUN: 13 mg/dL (ref 7–25)
CO2: 31 mmol/L (ref 20–32)
Calcium: 9.5 mg/dL (ref 8.6–10.4)
Chloride: 101 mmol/L (ref 98–110)
Creat: 0.6 mg/dL (ref 0.50–1.05)
Globulin: 2.5 g/dL (ref 1.9–3.7)
Glucose, Bld: 131 mg/dL — ABNORMAL HIGH (ref 65–99)
Potassium: 4 mmol/L (ref 3.5–5.3)
Sodium: 140 mmol/L (ref 135–146)
Total Bilirubin: 1.3 mg/dL — ABNORMAL HIGH (ref 0.2–1.2)
Total Protein: 7 g/dL (ref 6.1–8.1)
eGFR: 103 mL/min/{1.73_m2} (ref >=60)

## 2023-05-22 LAB — CBC
HCT: 43.5 % (ref 35.0–45.0)
Hemoglobin: 14.2 g/dL (ref 11.7–15.5)
MCH: 30.2 pg (ref 27.0–33.0)
MCHC: 32.6 g/dL (ref 32.0–36.0)
MCV: 92.6 fL (ref 80.0–100.0)
MPV: 11.4 fL (ref 7.5–12.5)
Platelets: 248 10*3/uL (ref 140–400)
RBC: 4.7 10*6/uL (ref 3.80–5.10)
RDW: 12.5 % (ref 11.0–15.0)
WBC: 4.7 10*3/uL (ref 3.8–10.8)

## 2023-05-22 LAB — LIPID PANEL
Cholesterol: 161 mg/dL (ref <200)
HDL: 23 mg/dL — ABNORMAL LOW (ref >=50)
LDL Cholesterol (Calc): 118 mg/dL — ABNORMAL HIGH
Non-HDL Cholesterol (Calc): 138 mg/dL — ABNORMAL HIGH (ref <130)
Total CHOL/HDL Ratio: 7 (calc) — ABNORMAL HIGH (ref <5.0)
Triglycerides: 102 mg/dL (ref <150)

## 2023-05-22 LAB — HEMOGLOBIN A1C
Hgb A1c MFr Bld: 6.8 %{Hb} — ABNORMAL HIGH (ref <5.7)
Mean Plasma Glucose: 148 mg/dL
eAG (mmol/L): 8.2 mmol/L

## 2023-05-24 ENCOUNTER — Encounter: Payer: Self-pay | Admitting: Internal Medicine

## 2023-05-28 ENCOUNTER — Ambulatory Visit: Payer: Medicaid Other | Admitting: Podiatry

## 2023-05-28 ENCOUNTER — Ambulatory Visit (INDEPENDENT_AMBULATORY_CARE_PROVIDER_SITE_OTHER): Payer: Medicaid Other

## 2023-05-28 ENCOUNTER — Encounter: Payer: Self-pay | Admitting: Podiatry

## 2023-05-28 DIAGNOSIS — M7662 Achilles tendinitis, left leg: Secondary | ICD-10-CM

## 2023-05-28 MED ORDER — MELOXICAM 15 MG PO TABS: 15.0000 mg | ORAL_TABLET | Freq: Every day | ORAL | 1 refills | Status: DC

## 2023-05-28 NOTE — Progress Notes (Signed)
   Chief Complaint  Patient presents with   Tendonitis    It was doing good until that shot wore off.    HPI: 63 y.o. female presenting today for second opinion regarding pain and tenderness to the left posterior heel.  She has been manage with Dr. Tobie over the past year for Achilles tendinitis.  She says that she did receive an injection in the past which helped significantly  Past Medical History:  Diagnosis Date   Allergy    Ear infection    recurrent   Hypertension     Past Surgical History:  Procedure Laterality Date   ABDOMINAL HYSTERECTOMY  2019   partial   COLONOSCOPY WITH PROPOFOL  N/A 11/10/2021   Procedure: COLONOSCOPY WITH PROPOFOL ;  Surgeon: Unk Corinn Skiff, MD;  Location: [facility] ENDOSCOPY;  Service: Gastroenterology;  Laterality: N/A;   TONSILLECTOMY     TUBAL LIGATION     bilateral    No Known Allergies   Physical Exam: General: The patient is alert and oriented x3 in no acute distress.  Dermatology: Skin is warm, dry and supple bilateral lower extremities. Negative for open lesions or macerations.  Vascular: Palpable pedal pulses bilaterally. No edema or erythema noted. Capillary refill within normal limits.  Neurological: Grossly intact via light touch  Musculoskeletal Exam: Pain on palpation noted to the posterior tubercle of the left calcaneus at the insertion of the Achilles tendon consistent with retrocalcaneal bursitis. Range of motion within normal limits. Muscle strength 5/5 in all muscle groups bilateral lower extremities.  Radiographic ExamLT foot 05/28/2023:  Posterior calcaneal spur noted to the respective calcaneus on lateral view. No fracture or dislocation noted. Normal osseous mineralization noted.     Assessment: 1. Insertional Achilles tendinitis left 2.  Posterior heel spur left  Plan of Care:  -Patient evaluated.  X-rays reviewed -Today we discussed the pathology and etiology of Achilles tendinitis as well as posterior heel  spurs.  For now we are going to pursue conservative treatment -Prescription for meloxicam  15 mg daily -Recommend shoes that do not irritate the posterior heel -Recommend daily stretching exercises -Return to clinic as needed   Thresa EMERSON Sar, DPM [facility]  Dr. Thresa EMERSON Sar, DPM    [address] [place], [place] [address]                Office [phone]  Fax [phone]

## 2023-06-04 ENCOUNTER — Other Ambulatory Visit: Payer: Self-pay | Admitting: Nurse Practitioner

## 2023-06-04 DIAGNOSIS — K7581 Nonalcoholic steatohepatitis (NASH): Secondary | ICD-10-CM

## 2023-06-04 DIAGNOSIS — Z8619 Personal history of other infectious and parasitic diseases: Secondary | ICD-10-CM

## 2023-06-07 ENCOUNTER — Other Ambulatory Visit: Payer: Self-pay | Admitting: Physician Assistant

## 2023-06-07 ENCOUNTER — Other Ambulatory Visit: Payer: Self-pay | Admitting: Internal Medicine

## 2023-06-07 DIAGNOSIS — M48061 Spinal stenosis, lumbar region without neurogenic claudication: Secondary | ICD-10-CM

## 2023-06-07 DIAGNOSIS — M5416 Radiculopathy, lumbar region: Secondary | ICD-10-CM

## 2023-06-07 DIAGNOSIS — M47816 Spondylosis without myelopathy or radiculopathy, lumbar region: Secondary | ICD-10-CM

## 2023-06-08 ENCOUNTER — Encounter: Payer: Self-pay | Admitting: Internal Medicine

## 2023-06-08 ENCOUNTER — Ambulatory Visit: Payer: Medicaid Other | Admitting: Internal Medicine

## 2023-06-08 VITALS — BP 124/74 | Ht 67.0 in | Wt 239.2 lb

## 2023-06-08 DIAGNOSIS — K760 Fatty (change of) liver, not elsewhere classified: Secondary | ICD-10-CM | POA: Diagnosis not present

## 2023-06-08 DIAGNOSIS — E119 Type 2 diabetes mellitus without complications: Secondary | ICD-10-CM

## 2023-06-08 DIAGNOSIS — Z6837 Body mass index (BMI) 37.0-37.9, adult: Secondary | ICD-10-CM

## 2023-06-08 DIAGNOSIS — E66812 Obesity, class 2: Secondary | ICD-10-CM

## 2023-06-08 DIAGNOSIS — K589 Irritable bowel syndrome without diarrhea: Secondary | ICD-10-CM

## 2023-06-08 MED ORDER — TIRZEPATIDE 2.5 MG/0.5ML ~~LOC~~ SOAJ: 2.5000 mg | SUBCUTANEOUS | Status: DC

## 2023-06-08 MED ORDER — METFORMIN HCL 500 MG PO TABS: 500.0000 mg | ORAL_TABLET | Freq: Every day | ORAL | 0 refills | Status: DC

## 2023-06-08 NOTE — Progress Notes (Signed)
Subjective:    Patient ID: Diana [name], female    DOB: Apr 09, 1961, 63 y.o.   MRN: [id number]  HPI  Discussed the use of AI scribe software for clinical note transcription with the patient, who gave verbal consent to proceed.  Diana [name] is a 63 year old female with diabetes and fatty liver disease who presents for a follow-up regarding diabetes/weight loss medication. She was referred by a liver specialist for consideration of weight loss medication to address fatty liver disease.  She has a history of diabetes with a recent A1c of 6.8%. She has not been on any diabetes medication previously, including metformin, as she was initially advised to try weight loss, which was not successful. She is concerned about potential gastrointestinal side effects from diabetes medications due to her existing irritable bowel syndrome (IBS).  She experiences pain in the area where her liver is located, although it is likely related to her intestines rather than her liver per the liver specialist.    Review of Systems     Past Medical History:  Diagnosis Date   Allergy    Ear infection    recurrent   Hypertension     Current Outpatient Medications  Medication Sig Dispense Refill   Ascorbic Acid 125 MG CHEW Chew 1 each by mouth daily.     atorvastatin (LIPITOR) 10 MG tablet Take 1 tablet by mouth once daily 90 tablet 1   cetirizine (ZYRTEC) 10 MG tablet Take 10 mg by mouth daily as needed.     Cholecalciferol (VITAMIN D3) 25 MCG (1000 UT) CAPS Take 5,000 Units by mouth.      clonazePAM (KLONOPIN) 0.5 MG tablet Take 1 tablet (0.5 mg total) by mouth daily as needed. for anxiety 30 tablet 0   gabapentin (NEURONTIN) 300 MG capsule TAKE 1 CAPSULE (300 MG TOTAL) BY MOUTH AT BEDTIME. 30 capsule 2   hydrocortisone (ANUSOL-HC) 2.5 % rectal cream Place 1 Application rectally 2 (two) times daily. 30 g 0   ibuprofen (ADVIL) 200 MG tablet Take 200 mg by mouth every 6 (six) hours as needed.      losartan-hydrochlorothiazide (HYZAAR) 50-12.5 MG tablet Take 1 tablet by mouth once daily 90 tablet 0   meloxicam (MOBIC) 15 MG tablet Take 1 tablet (15 mg total) by mouth daily. 60 tablet 1   methocarbamol (ROBAXIN) 500 MG tablet Take 1 tablet (500 mg total) by mouth every 8 (eight) hours as needed for muscle spasms. 30 tablet 0   omeprazole (PRILOSEC) 40 MG capsule TAKE 1 CAPSULE BY MOUTH 2 TIMES DAILY. 60 capsule 1   traZODone (DESYREL) 50 MG tablet Take 0.5-1 tablets (25-50 mg total) by mouth at bedtime as needed for sleep. 30 tablet 0   Turmeric (QC TUMERIC COMPLEX) 500 MG CAPS Take by mouth.     UNABLE TO FIND Med Name: Alric Quan Mind supplement for cognitive function     No current facility-administered medications for this visit.    No Known Allergies  Family History  Problem Relation Age of Onset   Hypertension Mother        alive   Arthritis Mother    Thyroid disease Mother    Lung cancer Father        deceased   Heart disease Father    Hypertension Maternal Grandmother     Social History   Socioeconomic History   Marital status: Widowed    Spouse name: Not on file   Number of children: 3  Years of education: Not on file   Highest education level: 12th grade  Occupational History   Occupation: customer service    Employer: [employer]  Tobacco Use   Smoking status: Former    Current packs/day: 0.00    Types: Cigarettes    Quit date: 04/20/1985    Years since quitting: 38.1    Passive exposure: Past   Smokeless tobacco: Never   Tobacco comments:    remote tobacco use   Vaping Use   Vaping status: Never Used  Substance and Sexual Activity   Alcohol use: No    Alcohol/week: 0.0 standard drinks of alcohol   Drug use: No   Sexual activity: Not Currently  Other Topics Concern   Not on file  Social History Narrative   Not on file   Social Drivers of Health   Financial Resource Strain: Low Risk  (05/20/2023)   Overall Financial Resource Strain (CARDIA)     Difficulty of Paying Living Expenses: Not hard at all  Food Insecurity: Low Risk  (06/04/2023)   Received from [facility]   Hunger Vital Sign    Worried About Running Out of Food in the Last Year: Never true    Ran Out of Food in the Last Year: Never true  Transportation Needs: No Transportation Needs (06/04/2023)   Received from [facility]    In the past 12 months, has lack of reliable transportation kept you from medical appointments, meetings, work or from getting things needed for daily living? : No  Physical Activity: Unknown (05/20/2023)   Exercise Vital Sign    Days of Exercise per Week: 0 days    Minutes of Exercise per Session: Not on file  Stress: Stress Concern Present (05/20/2023)   [employer] of Occupational Health - Occupational Stress Questionnaire    Feeling of Stress : To some extent  Social Connections: Moderately Isolated (05/20/2023)   Social Connection and Isolation Panel [NHANES]    Frequency of Communication with Friends and Family: More than three times a week    Frequency of Social Gatherings with Friends and Family: Once a week    Attends Religious Services: Never    Database administrator or Organizations: No    Attends Engineer, structural: Not on file    Marital Status: Living with partner  Intimate Partner Violence: Not on file     Constitutional: Denies fever, malaise, fatigue, headache or abrupt weight changes.  HEENT: Denies eye pain, eye redness, ear pain, ringing in the ears, wax buildup, runny nose, nasal congestion, bloody nose, or sore throat. Respiratory: Denies difficulty breathing, shortness of breath, cough or sputum production.   Cardiovascular: Denies chest pain, chest tightness, palpitations or swelling in the hands or feet.  Gastrointestinal: Pt reports RUQ abdominal pain, intermittent constipation. Denies bloating, diarrhea or blood in the stool.  GU: Denies urgency, frequency, pain with urination,  burning sensation, blood in urine, odor or discharge. Musculoskeletal: Patient reports chronic low back pain, left knee pain.  Denies decrease in range of motion, difficulty with gait, or joint swelling.  Skin: Denies redness, rashes, lesions or ulcercations.  Neurological: Patient reports insomnia.  Denies dizziness, difficulty with memory, difficulty with speech or problems with balance and coordination.  Psych: Patient has history of anxiety.  Denies depression, SI/HI.  No other specific complaints in a complete review of systems (except as listed in HPI above).  Objective:   Physical Exam  BP 124/74 (BP Location:  Left Arm, Patient Position: Sitting, Cuff Size: Large)   Ht 5\' 7"  (1.702 m)   Wt 239 lb 3.2 oz (108.5 kg)   LMP 01/20/2016   BMI 37.46 kg/m    Wt Readings from Last 3 Encounters:  05/21/23 239 lb 12.8 oz (108.8 kg)  04/05/23 235 lb (106.6 kg)  03/25/23 235 lb 6.4 oz (106.8 kg)    General: Appears her stated age, obese, in NAD. Skin: Warm, dry and intact. No ulcerations noted. Cardiovascular: Normal rate and rhythm.  Pulmonary/Chest: Normal effort and positive vesicular breath sounds. No respiratory distress. No wheezes, rales or ronchi noted.  Neurological: Alert and oriented. Coordination normal.    BMET    Component Value Date/Time   NA 140 05/21/2023 1115   K 4.0 05/21/2023 1115   CL 101 05/21/2023 1115   CO2 31 05/21/2023 1115   GLUCOSE 131 (H) 05/21/2023 1115   BUN 13 05/21/2023 1115   CREATININE 0.60 05/21/2023 1115   CALCIUM 9.5 05/21/2023 1115   GFRNONAA >60 07/02/2021 2000   GFRAA >60 07/27/2017 2153    Lipid Panel     Component Value Date/Time   CHOL 161 05/21/2023 1115   CHOL 151 12/29/2022 1359   TRIG 102 05/21/2023 1115   TRIG 109 12/29/2022 1359   HDL 23 (L) 05/21/2023 1115   CHOLHDL 7.0 (H) 05/21/2023 1115   VLDL 21.0 02/09/2019 1245   LDLCALC 118 (H) 05/21/2023 1115    CBC    Component Value Date/Time   WBC 4.7 05/21/2023  1115   RBC 4.70 05/21/2023 1115   HGB 14.2 05/21/2023 1115   HCT 43.5 05/21/2023 1115   PLT 248 05/21/2023 1115   MCV 92.6 05/21/2023 1115   MCH 30.2 05/21/2023 1115   MCHC 32.6 05/21/2023 1115   RDW 12.5 05/21/2023 1115   LYMPHSABS 1.1 07/28/2016 1232   MONOABS 0.4 07/28/2016 1232   EOSABS 0.0 07/28/2016 1232   BASOSABS 0.0 07/28/2016 1232    Hgb A1C Lab Results  Component Value Date   HGBA1C 6.8 (H) 05/21/2023           Assessment & Plan:  Assessment and Plan    Type 2 Diabetes Mellitus A1c 6.8% without current pharmacological management. Discussed the benefits of Mounjaro for both glycemic control and weight loss, but insurance requires a trial of Metformin first. -Prescribe Metformin 500mg  daily for 30 days. -Plan to start Mounjaro in 2 weeks, patient to message provider if Metformin causes GI upset.  Obesity Discussed the benefits of Mounjaro for weight loss in addition to glycemic control. -Plan to start Mounjaro in 2 weeks, patient to message provider if Metformin causes GI upset.  Fatty Liver Disease Discussed the benefits of Mounjaro for weight loss which could potentially reduce fatty liver. -Plan to start Mounjaro in 2 weeks, patient to message provider if Metformin causes GI upset.      RTC in 3 months for your annual exam [name], NP

## 2023-06-08 NOTE — Assessment & Plan Note (Signed)
 Encourage diet and exercise for weight loss

## 2023-06-08 NOTE — Telephone Encounter (Signed)
Requested Prescriptions  Pending Prescriptions Disp Refills   losartan-hydrochlorothiazide (HYZAAR) 50-12.5 MG tablet [Pharmacy Med Name: Losartan Potassium-HCTZ 50-12.5 MG Oral Tablet] 90 tablet 0    Sig: Take 1 tablet by mouth once daily     Cardiovascular: ARB + Diuretic Combos Passed - 06/08/2023 10:22 AM      Passed - K in normal range and within 180 days    Potassium  Date Value Ref Range Status  05/21/2023 4.0 3.5 - 5.3 mmol/L Final         Passed - Na in normal range and within 180 days    Sodium  Date Value Ref Range Status  05/21/2023 140 135 - 146 mmol/L Final         Passed - Cr in normal range and within 180 days    Creat  Date Value Ref Range Status  05/21/2023 0.60 0.50 - 1.05 mg/dL Final   Creatinine, Urine  Date Value Ref Range Status  10/26/2022 113 20 - 275 mg/dL Final         Passed - eGFR is 10 or above and within 180 days    GFR calc Af Amer  Date Value Ref Range Status  07/27/2017 >60 >60 mL/min Final    Comment:    (NOTE) The eGFR has been calculated using the CKD EPI equation. This calculation has not been validated in all clinical situations. eGFR's persistently <60 mL/min signify possible Chronic Kidney Disease.    GFR, Estimated  Date Value Ref Range Status  07/02/2021 >60 >60 mL/min Final    Comment:    (NOTE) Calculated using the CKD-EPI Creatinine Equation (2021)    GFR  Date Value Ref Range Status  09/19/2019 92.37 >60.00 mL/min Final   eGFR  Date Value Ref Range Status  05/21/2023 103 > OR = 60 mL/min/1.27m2 Final         Passed - Patient is not pregnant      Passed - Last BP in normal range    BP Readings from Last 1 Encounters:  06/08/23 124/74         Passed - Valid encounter within last 6 months    Recent Outpatient Visits           2 weeks ago Type 2 diabetes mellitus without complication, without long-term current use of insulin [facility])   [facility] [place], Salvadore Oxford, NP   2 months  ago Upper respiratory tract infection, unspecified type   [facility] [place], Oswaldo Conroy, PA-C   2 months ago Dysuria   [facility] [place], [place] W, NP   4 months ago Gastroesophageal reflux disease without esophagitis   [facility] [place], [place] W, NP   5 months ago NAFLD (nonalcoholic fatty liver disease)   [facility] [place], Salvadore Oxford, NP       Future Appointments             In 3 weeks Vanga, Loel Dubonnet, MD [facility] [place]   In 3 months Baity, Salvadore Oxford, NP [facility]

## 2023-06-08 NOTE — Patient Instructions (Signed)
 Nonalcoholic Fatty Liver Disease Diet, Adult Nonalcoholic fatty liver disease is a condition that causes fat to build up in and around the liver. The disease makes it harder for the liver to work the way that it should. Eating a healthy diet of fruits, vegetables, whole grains, lean proteins, and limiting added sugar and fats can help to keep nonalcoholic fatty liver disease under control. It can also help to prevent or improve conditions that are related to the disease, such as heart disease, diabetes, high blood pressure, obesity, and high cholesterol. Along with regular exercise, this diet: Promotes weight loss. Helps to control blood sugar levels. Helps to improve the way that the body uses insulin. What are tips for following this plan? Reading food labels Always check food labels for: The amount of saturated fat in a food. You should limit how much saturated fat you eat. Saturated fat is found in foods that come from animals, including meat and dairy products such as butter, cheese, and whole milk. The amount of fiber in a food. You should choose high-fiber foods such as fruits, vegetables, and whole grains. Try to get 25-30 grams (g) of fiber a day. Added sugar. Avoid foods with a high amount of added sugar and high fructose corn syrup. Avoid sweetened soft drinks, sweetened tea, lemonade, sports drinks, and juices that are not 100% juice. Aim for foods with less than 5 grams of added sugar. Every 4 grams of added sugar is 1 teaspoon (tsp) of sugar per serving. Cooking When cooking, use heart-healthy oils that are high in monounsaturated fats. These include olive oil, canola oil, and avocado oil. Limit frying or deep-frying foods. Cook foods using healthy methods such as baking, boiling, steaming, and grilling instead. Meal planning You may want to keep track of how many calories you eat and drink. Eating the right amount of calories will help you achieve a healthy weight. Meeting with a  dietitian can help you get started. Limit how often you eat takeout and fast food. These foods are usually very high in fat, salt, and sugar. Use the glycemic index (GI) to plan your meals. The index tells you how quickly a food will raise your blood sugar. Choose low-GI foods. Low-GI foods have a GI less than 55. These foods take a longer time to raise blood sugar. A dietitian can help you pick foods that are lower on the GI scale. Try to include some meals each week that replace meat with beans or legumes. Add fish 2-3 times a week, especially heart healthy oily fishes like salmon, sardines, trout, tuna, or mackerel. Lifestyle You may want to follow a Mediterranean diet. This diet includes a lot of vegetables, lean meats or fish, nuts and seeds, whole grains, fruits, and healthy oils and fats. What foods can I eat?  Fruits Apples. Bananas. Pears. Grapes. Papaya. Plums. Kiwi. Grapefruit. Cherries. Strawberries. Vegetables Lettuce. Spinach. Peas. Beets. Cauliflower. Cabbage. Broccoli. Carrots. Tomatoes. Squash. Eggplant. Herbs. Peppers. Onions. Cucumbers. Brussels sprouts. Yams and sweet potatoes. Grains Whole wheat or whole-grain foods, including breads, crackers, cereals, and pasta. Stone-ground whole wheat. Unsweetened oatmeal. Bulgur. Barley. Quinoa. Brown or wild rice. Corn or whole wheat flour tortillas. Meats and other proteins Lean meats. Poultry. Tofu. Seafood and shellfish. Beans. Lentils. Dairy Low-fat or fat-free dairy products, such as yogurt, cottage cheese, or cheese. Beverages Water. Sugar-free drinks. Tea. Coffee. Low-fat or skim milk. Milk alternatives, such as unsweetened soy, oat, or almond milk. Real fruit juice. Fats and oils Avocado. Canola or  olive oil. Nuts and nut butters. Seeds. Seasonings and condiments Mustard. Relish. Low-fat, low-sugar ketchup and barbecue sauce. Low-fat or fat-free mayonnaise. Sweets and desserts Sugar-free sweets. The items listed above may  not be all the foods and drinks you can have. Talk to a dietitian to learn more. What foods should I limit or avoid? Grains White rice. Pasta. Breads. Meats and other proteins Limit red meat to 1-2 times a week. Dairy [employer]. Fats and oils Palm oil and coconut oil. Fried foods. Other foods Processed foods. Foods that contain a lot of salt (sodium) or added sugar. Sweets and desserts Sweets that contain sugar. Bakery items such as cookies, cakes, and other pastries. Beverages Sweetened drinks, such as sweet tea, milkshakes, iced sweet drinks, and sodas. Alcohol. The items listed above may not be all the foods and drinks you should avoid. Talk to a dietitian to learn more. Where to find more information The [employer] of Diabetes and Digestive and Kidney Diseases: [employer]Sync.si This information is not intended to replace advice given to you by your health care provider. Make sure you discuss any questions you have with your health care provider. Document Revised: 01/19/2022 Document Reviewed: 01/19/2022 Elsevier Patient Education  2024 [employer].

## 2023-06-09 ENCOUNTER — Ambulatory Visit
Admission: RE | Admit: 2023-06-09 | Discharge: 2023-06-09 | Disposition: A | Discharge status: Home / Self Care | Payer: Medicaid Other | Source: Ambulatory Visit | Attending: Nurse Practitioner | Admitting: Nurse Practitioner

## 2023-06-09 DIAGNOSIS — N289 Disorder of kidney and ureter, unspecified: Secondary | ICD-10-CM | POA: Diagnosis not present

## 2023-06-09 DIAGNOSIS — K7581 Nonalcoholic steatohepatitis (NASH): Secondary | ICD-10-CM

## 2023-06-09 DIAGNOSIS — Z8619 Personal history of other infectious and parasitic diseases: Secondary | ICD-10-CM

## 2023-06-11 DIAGNOSIS — Z8619 Personal history of other infectious and parasitic diseases: Secondary | ICD-10-CM | POA: Diagnosis not present

## 2023-06-11 DIAGNOSIS — K7581 Nonalcoholic steatohepatitis (NASH): Secondary | ICD-10-CM | POA: Diagnosis not present

## 2023-06-16 ENCOUNTER — Encounter: Payer: Self-pay | Admitting: Internal Medicine

## 2023-06-24 ENCOUNTER — Encounter: Payer: Self-pay | Admitting: Internal Medicine

## 2023-06-24 ENCOUNTER — Ambulatory Visit: Admitting: Internal Medicine

## 2023-06-24 VITALS — BP 122/78 | Ht 67.0 in | Wt 234.0 lb

## 2023-06-24 DIAGNOSIS — Z7985 Long-term (current) use of injectable non-insulin antidiabetic drugs: Secondary | ICD-10-CM | POA: Diagnosis not present

## 2023-06-24 DIAGNOSIS — E119 Type 2 diabetes mellitus without complications: Secondary | ICD-10-CM

## 2023-06-24 DIAGNOSIS — Z7984 Long term (current) use of oral hypoglycemic drugs: Secondary | ICD-10-CM | POA: Diagnosis not present

## 2023-06-24 DIAGNOSIS — K760 Fatty (change of) liver, not elsewhere classified: Secondary | ICD-10-CM | POA: Diagnosis not present

## 2023-06-24 DIAGNOSIS — N2889 Other specified disorders of kidney and ureter: Secondary | ICD-10-CM | POA: Diagnosis not present

## 2023-06-24 DIAGNOSIS — T887XXA Unspecified adverse effect of drug or medicament, initial encounter: Secondary | ICD-10-CM

## 2023-06-24 NOTE — Progress Notes (Signed)
 Subjective:    Patient ID: Diana [name], female    DOB: [birth year], 63 y.o.   MRN: [id number]  HPI   Discussed the use of AI scribe software for clinical note transcription with the patient, who gave verbal consent to proceed.  Diana [name] is a 63 year old female with fatty liver disease who presents with side effects from [facility].  She is experiencing significant side effects after starting Mounjaro for fatty liver disease. The first week was tolerable with mild nausea and loss of appetite, but during the second week, she developed severe nausea and gastrointestinal discomfort, describing it as feeling horrible every day. She is on a 2.5 mg dose and is apprehensive about taking the third dose due to these side effects. She has a history of nausea and has used Zofran effectively to manage these symptoms.  She has a history of fatty liver disease and is uncertain about a diagnosis of cirrhosis. A liver biopsy in 2019 showed no fibrosis or fatty liver, but recent tests have been inconclusive. She has experienced right upper quadrant pain, sometimes radiating to the back, and questions whether this pain is related to her liver condition.  A 7 mm mass in the superior pole of the right kidney was identified as likely an angiomyolipoma on an ultrasound from June 09, 2023. She is concerned about this finding and its potential relation to her symptoms, although it is being monitored with a follow-up ultrasound planned in six months.   Review of Systems     Past Medical History:  Diagnosis Date   Allergy    Ear infection    recurrent   Hypertension     Current Outpatient Medications  Medication Sig Dispense Refill   Ascorbic Acid 125 MG CHEW Chew 1 each by mouth daily.     atorvastatin (LIPITOR) 10 MG tablet Take 1 tablet by mouth once daily 90 tablet 1   cetirizine (ZYRTEC) 10 MG tablet Take 10 mg by mouth daily as needed.     Cholecalciferol (VITAMIN D3) 25 MCG (1000 UT) CAPS  Take 5,000 Units by mouth.      clonazePAM (KLONOPIN) 0.5 MG tablet Take 1 tablet (0.5 mg total) by mouth daily as needed. for anxiety 30 tablet 0   gabapentin (NEURONTIN) 300 MG capsule TAKE 1 CAPSULE (300 MG TOTAL) BY MOUTH AT BEDTIME. 30 capsule 2   hydrocortisone (ANUSOL-HC) 2.5 % rectal cream Place 1 Application rectally 2 (two) times daily. 30 g 0   ibuprofen (ADVIL) 200 MG tablet Take 200 mg by mouth every 6 (six) hours as needed.     losartan-hydrochlorothiazide (HYZAAR) 50-12.5 MG tablet Take 1 tablet by mouth once daily 90 tablet 0   meloxicam (MOBIC) 15 MG tablet Take 1 tablet (15 mg total) by mouth daily. 60 tablet 1   metFORMIN (GLUCOPHAGE) 500 MG tablet Take 1 tablet (500 mg total) by mouth daily with breakfast. 30 tablet 0   methocarbamol (ROBAXIN) 500 MG tablet Take 1 tablet (500 mg total) by mouth every 8 (eight) hours as needed for muscle spasms. 30 tablet 0   omeprazole (PRILOSEC) 40 MG capsule TAKE 1 CAPSULE BY MOUTH 2 TIMES DAILY. 60 capsule 1   tirzepatide (MOUNJARO) 2.5 MG/0.5ML Pen Inject 2.5 mg into the skin once a week.     traZODone (DESYREL) 50 MG tablet Take 0.5-1 tablets (25-50 mg total) by mouth at bedtime as needed for sleep. 30 tablet 0   No current facility-administered medications for this  visit.    No Known Allergies  Family History  Problem Relation Age of Onset   Hypertension Mother        alive   Arthritis Mother    Thyroid disease Mother    Lung cancer Father        deceased   Heart disease Father    Hypertension Maternal Grandmother     Social History   Socioeconomic History   Marital status: Widowed    Spouse name: Not on file   Number of children: 3   Years of education: Not on file   Highest education level: 12th grade  Occupational History   Occupation: customer service    Employer: [employer]  Tobacco Use   Smoking status: Former    Current packs/day: 0.00    Types: Cigarettes    Quit date: 04/20/1985    Years since  quitting: 38.2    Passive exposure: Past   Smokeless tobacco: Never   Tobacco comments:    remote tobacco use   Vaping Use   Vaping status: Never Used  Substance and Sexual Activity   Alcohol use: No    Alcohol/week: 0.0 standard drinks of alcohol   Drug use: No   Sexual activity: Not Currently  Other Topics Concern   Not on file  Social History Narrative   Not on file   Social Drivers of Health   Financial Resource Strain: Low Risk  (05/20/2023)   Overall Financial Resource Strain (CARDIA)    Difficulty of Paying Living Expenses: Not hard at all  Food Insecurity: Low Risk  (06/04/2023)   Received from [facility]   Hunger Vital Sign    Worried About Running Out of Food in the Last Year: Never true    Ran Out of Food in the Last Year: Never true  Transportation Needs: No Transportation Needs (06/04/2023)   Received from [facility]    In the past 12 months, has lack of reliable transportation kept you from medical appointments, meetings, work or from getting things needed for daily living? : No  Physical Activity: Unknown (05/20/2023)   Exercise Vital Sign    Days of Exercise per Week: 0 days    Minutes of Exercise per Session: Not on file  Stress: Stress Concern Present (05/20/2023)   [employer] of Occupational Health - Occupational Stress Questionnaire    Feeling of Stress : To some extent  Social Connections: Moderately Isolated (05/20/2023)   Social Connection and Isolation Panel [NHANES]    Frequency of Communication with Friends and Family: More than three times a week    Frequency of Social Gatherings with Friends and Family: Once a week    Attends Religious Services: Never    Database administrator or Organizations: No    Attends Engineer, structural: Not on file    Marital Status: Living with partner  Intimate Partner Violence: Not on file     Constitutional: Denies fever, malaise, fatigue, headache or abrupt weight changes.   HEENT: Denies eye pain, eye redness, ear pain, ringing in the ears, wax buildup, runny nose, nasal congestion, bloody nose, or sore throat. Respiratory: Denies difficulty breathing, shortness of breath, cough or sputum production.   Cardiovascular: Denies chest pain, chest tightness, palpitations or swelling in the hands or feet.  Gastrointestinal: Pt reports nausea, RUQ abdominal pain, bloating, constipation and loose stools. Denies bloating or blood in the stool.  GU: Denies urgency, frequency, pain with urination, burning  sensation, blood in urine, odor or discharge. Musculoskeletal: Patient reports chronic low back pain, left knee pain.  Denies decrease in range of motion, difficulty with gait, or joint swelling.  Skin: Denies redness, rashes, lesions or ulcercations.  Neurological: Patient reports insomnia.  Denies dizziness, difficulty with memory, difficulty with speech or problems with balance and coordination.  Psych: Patient has history of anxiety.  Denies depression, SI/HI.  No other specific complaints in a complete review of systems (except as listed in HPI above).  Objective:   Physical Exam  BP 122/78 (BP Location: Left Arm, Patient Position: Sitting, Cuff Size: Large)   Ht 5\' 7"  (1.702 m)   Wt 234 lb (106.1 kg)   LMP 01/20/2016   BMI 36.65 kg/m     Wt Readings from Last 3 Encounters:  06/08/23 239 lb 3.2 oz (108.5 kg)  05/21/23 239 lb 12.8 oz (108.8 kg)  04/05/23 235 lb (106.6 kg)    General: Appears her stated age, obese, in NAD. Skin: Warm, dry and intact. No ulcerations noted. Cardiovascular: Normal rate and rhythm.  Pulmonary/Chest: Normal effort and positive vesicular breath sounds. No respiratory distress. No wheezes, rales or ronchi noted.  Neurological: Alert and oriented. Coordination normal.    BMET    Component Value Date/Time   NA 140 05/21/2023 1115   K 4.0 05/21/2023 1115   CL 101 05/21/2023 1115   CO2 31 05/21/2023 1115   GLUCOSE 131 (H)  05/21/2023 1115   BUN 13 05/21/2023 1115   CREATININE 0.60 05/21/2023 1115   CALCIUM 9.5 05/21/2023 1115   GFRNONAA >60 07/02/2021 2000   GFRAA >60 07/27/2017 2153    Lipid Panel     Component Value Date/Time   CHOL 161 05/21/2023 1115   CHOL 151 12/29/2022 1359   TRIG 102 05/21/2023 1115   TRIG 109 12/29/2022 1359   HDL 23 (L) 05/21/2023 1115   CHOLHDL 7.0 (H) 05/21/2023 1115   VLDL 21.0 02/09/2019 1245   LDLCALC 118 (H) 05/21/2023 1115    CBC    Component Value Date/Time   WBC 4.7 05/21/2023 1115   RBC 4.70 05/21/2023 1115   HGB 14.2 05/21/2023 1115   HCT 43.5 05/21/2023 1115   PLT 248 05/21/2023 1115   MCV 92.6 05/21/2023 1115   MCH 30.2 05/21/2023 1115   MCHC 32.6 05/21/2023 1115   RDW 12.5 05/21/2023 1115   LYMPHSABS 1.1 07/28/2016 1232   MONOABS 0.4 07/28/2016 1232   EOSABS 0.0 07/28/2016 1232   BASOSABS 0.0 07/28/2016 1232    Hgb A1C Lab Results  Component Value Date   HGBA1C 6.8 (H) 05/21/2023           Assessment & Plan:  Assessment and Plan    Assessment and Plan    Fatty Liver Disease, DM 2, Medication Side Effect   Patient experiencing severe nausea and gastrointestinal upset after starting Mounjaro 2.5mg  for fatty liver disease. Discussed the common side effects and the potential for her to improve or persist. Unclear if cirrhosis is present, pending FibroScan on 07/12/2023.   - Patient to decide whether to continue [facility] and inform via MyChart.   - If discontinuing Mounjaro, consider switching to [employer] pending insurance coverage.   - Zofran can be used for nausea as needed.    Renal Mass   7mm mass on superior pole of right kidney, likely angiomyolipoma.   - Repeat ultrasound in August 2025 to monitor for changes.      RTC in 2 months for  your annual exam [name], NP

## 2023-06-24 NOTE — Patient Instructions (Signed)
 Tirzepatide Injection What is this medication? TIRZEPATIDE (tir ZEP a tide) treats type 2 diabetes. It works by increasing insulin levels in your body, which decreases your blood sugar (glucose). It also reduces the amount of sugar released into your blood and slows down your digestion. Changes to diet and exercise are often combined with this medication. This medicine may be used for other purposes; ask your health care provider or pharmacist if you have questions. COMMON BRAND NAME(S): MOUNJARO What should I tell my care team before I take this medication? They need to know if you have any of these conditions: Eye disease caused by diabetes Gallbladder disease Have or have had pancreatitis Having surgery Kidney disease Personal or family history of MEN 2, a condition that causes endocrine gland tumors Personal or family history of thyroid cancer Stomach or intestine problems, such as problems digesting food An unusual or allergic reaction to tirzepatide, other medications, foods, dyes, or preservatives Pregnant or trying to get pregnant Breastfeeding How should I use this medication? This medication is injected under the skin. You will be taught how to prepare and give it. It is given once every week (every 7 days). Keep taking it unless your health care provider tells you to stop. If you use this medication with insulin, you should inject this medication and the insulin separately. Do not mix them together. Do not give the injections right next to each other. Change (rotate) injection sites with each injection. This medication comes with INSTRUCTIONS FOR USE. Ask your pharmacist for directions on how to use this medication. Read the information carefully. Talk to your pharmacist or care team if you have questions. It is important that you put your used needles and syringes in a special sharps container. Do not put them in a trash can. If you do not have a sharps container, call your  pharmacist or care team to get one. A special MedGuide will be given to you by the pharmacist with each prescription and refill. Be sure to read this information carefully each time. Talk to your care team about the use of this medication in children. Special care may be needed. Overdosage: If you think you have taken too much of this medicine contact a poison control center or emergency room at once. NOTE: This medicine is only for you. Do not share this medicine with others. What if I miss a dose? If you miss a dose, take it as soon as you can unless it is more than 4 days (96 hours) late. If it is more than 4 days late, skip the missed dose. Take the next dose at the normal time. Do not take 2 doses within 3 days of each other. What may interact with this medication? Alcohol Antiviral medications for HIV or AIDS Aspirin and aspirin-like medications Beta blockers, such as atenolol, metoprolol, propranolol Certain medications for blood pressure, heart disease, irregular heart beat Chromium Clonidine Diuretics Estrogen or progestin hormones Fenofibrate Gemfibrozil Guanethidine Isoniazid Lanreotide Female hormones or anabolic steroids MAOIs, such as Marplan, Nardil, and Parnate Medications for weight loss Medications for allergies, asthma, cold, or cough Medications for depression, anxiety, or mental health conditions Niacin Nicotine NSAIDs, medications for pain and inflammation, such as ibuprofen or naproxen Octreotide Other medications for diabetes, such as glyburide, glipizide, or glimepiride Pasireotide Pentamidine Phenytoin Probenecid Quinolone antibiotics, such as ciprofloxacin, levofloxacin, ofloxacin Reserpine Some herbal dietary supplements Steroid medications, such as prednisone or cortisone Sulfamethoxazole; trimethoprim Thyroid hormones Warfarin This list may not describe all possible  interactions. Give your health care provider a list of all the medicines, herbs,  non-prescription drugs, or dietary supplements you use. Also tell them if you smoke, drink alcohol, or use illegal drugs. Some items may interact with your medicine. What should I watch for while using this medication? Visit your care team for regular checks on your progress. Tell your care team if your symptoms do not start to get better or if they get worse. You may need blood work done while you are taking this medication. Your care team will monitor your HbA1C (A1C). This test shows what your average blood sugar (glucose) level was over the past 2 to 3 months. Know the symptoms of low blood sugar and know how to treat it. Always carry a source of quick sugar with you. Examples include hard sugar candy or glucose tablets. Make sure others know that you can choke if you eat or drink if your blood sugar is too low and you are unable to care for yourself. Get medical help at once. Tell your care team if you have high blood sugar. Your medication dose may change if your body is under stress. Some types of stress that may affect your blood sugar include fever, infection, and surgery. Do not share pens or cartridges with anyone, even if the needle is changed. Each pen should only be used by one person. Sharing could cause an infection. Wear a medical ID bracelet or chain. Carry a card that describes your condition. List the medications and doses you take on the card. Talk to your care team about your risk of cancer. You may be more at risk for certain types of cancer if you take this medication. Talk to your care team right away if you have a lump or swelling in your neck, hoarseness that does not go away, trouble swallowing, shortness of breath, or trouble breathing. Make sure you stay hydrated while taking this medication. Drink water often. Eat fruits and veggies that have a high water content. Drink more water when it is hot or you are active. Talk to your care team right away if you have fever, infection,  vomiting, diarrhea, or if you sweat a lot while taking this medication. The loss of too much body fluid may make it dangerous for you to take this medication. If you are going to need surgery or a procedure, tell your care team that you are taking this medication. Estrogen and progestin hormones that you take by mouth may not work as well while you are taking this medication. Switch to a non-oral contraceptive or add a barrier contraceptive for 4 weeks after starting this medication and after each dose increase. Talk to your care team about contraceptive options. They can help you find the option that works for you. What side effects may I notice from receiving this medication? Side effects that you should report to your care team as soon as possible: Allergic reactions or angioedema--skin rash, itching or hives, swelling of the face, eyes, lips, tongue, arms, or legs, trouble swallowing or breathing Change in vision Dehydration--increased thirst, dry mouth, feeling faint or lightheaded, headache, dark yellow or brown urine Gallbladder problems--severe stomach pain, nausea, vomiting, fever Kidney injury--decrease in the amount of urine, swelling of the ankles, hands, or feet Pancreatitis--severe stomach pain that spreads to your back or gets worse after eating or when touched, fever, nausea, vomiting Thoughts of suicide or self-harm, worsening mood, feelings of depression Thyroid cancer--new mass or lump in the neck,  pain or trouble swallowing, trouble breathing, hoarseness Side effects that usually do not require medical attention (report these to your care team if they continue or are bothersome): Diarrhea Loss of appetite Nausea Upset stomach This list may not describe all possible side effects. Call your doctor for medical advice about side effects. You may report side effects to FDA at 1-800-FDA-1088. Where should I keep my medication? Keep out of the reach of children and  pets. Refrigeration (preferred): Store in the refrigerator. Keep this medication in the original carton until you are ready to take it. Do not freeze. Protect from light. Get rid of opened vials after use, even if there is medication left. Get rid of any unopened vials or pens after the expiration date. Room Temperature: This medication may be stored at room temperature below 30 degrees C (86 degrees F) for up to 21 days. Keep this medication in the original carton until you are ready to take it. Protect from light. Avoid exposure to extreme heat. Get rid of opened vials after use, even if there is medication left. Get rid of any unopened vials or pens after 21 days, or after they expire, whichever is first. To get rid of medications that are no longer needed or have expired: Take the medication to a medication take-back program. Check with your pharmacy or law enforcement to find a location. If you cannot return the medication, ask your pharmacist or care team how to get rid of this medication safely. NOTE: This sheet is a summary. It may not cover all possible information. If you have questions about this medicine, talk to your doctor, pharmacist, or health care provider.  2024 Elsevier/Gold Standard (2023-03-19 00:00:00)

## 2023-06-29 ENCOUNTER — Ambulatory Visit: Payer: No Typology Code available for payment source | Admitting: Gastroenterology

## 2023-06-30 ENCOUNTER — Ambulatory Visit (INDEPENDENT_AMBULATORY_CARE_PROVIDER_SITE_OTHER): Payer: No Typology Code available for payment source | Admitting: Gastroenterology

## 2023-06-30 ENCOUNTER — Encounter: Payer: Self-pay | Admitting: Gastroenterology

## 2023-06-30 VITALS — BP 119/76 | HR 81 | Temp 98.6°F | Ht 67.0 in | Wt 232.2 lb

## 2023-06-30 DIAGNOSIS — B181 Chronic viral hepatitis B without delta-agent: Secondary | ICD-10-CM

## 2023-06-30 DIAGNOSIS — K7402 Hepatic fibrosis, advanced fibrosis: Secondary | ICD-10-CM | POA: Diagnosis not present

## 2023-06-30 DIAGNOSIS — K7581 Nonalcoholic steatohepatitis (NASH): Secondary | ICD-10-CM | POA: Diagnosis not present

## 2023-06-30 DIAGNOSIS — K625 Hemorrhage of anus and rectum: Secondary | ICD-10-CM | POA: Diagnosis not present

## 2023-06-30 NOTE — Progress Notes (Signed)
 [name], MD [address]  [address]  [place], [place] [address]  Main: [phone]  Fax: [phone]    Gastroenterology Consultation  Referring Provider:     [name], NP Primary Care Physician:  [name], NP Primary Gastroenterologist:  Dr. [name] Reason for Consultation: Follow-up of fatty liver   HPI:   Diana [name] is a 63 y.o. female referred by  [name], NP  for consultation & management of chronic hepatitis B infection.Diana [name] has history of hepatitis B infection, treated with entecavir with no detection of active infection for last 2 years, E antigen negative, E antibody positive.   And, viral load is undetectable most recently in 11/2019. She does have fatty liver. She denies any GI symptoms today. LFTs are normal except for indirect hyperbilirubinemia.  Patient did not show for her screening colonoscopy last year.  She reports intermittent constipation.  She does have flareup of hemorrhoids, mostly swelling and discomfort.  She has been gaining weight  Follow-up visit 07/28/21 Patient made a follow-up appointment when she had flareup of GERD symptoms about 3 months ago.  Her PCP increased omeprazole 40 mg to twice daily and apparently patient reports that it did not help.  Currently, her GERD symptoms have subsided And she is on omeprazole 40 mg once a day.  She continues to gain weight, does admit to drinking sweet tea daily.  Patient did not undergo ultrasound liver as she was not able to afford co-pay.  She did not undergo colonoscopy as well  She is also overdue for colon cancer screening  Follow-up visit 10/28/2021 Patient made an appointment to discuss about 2 weeks history of painless rectal bleeding.  Patient reports that she has been passing bright red blood per rectum, dripping into the toilet bowl as well as on wiping.  Her bowel movements tend to be on the harder side associated with some straining.  She does have  bowel movement on a daily basis.  Patient is past due for her colonoscopy.  She would like to know if this is secondary to hemorrhoids.  Patient has not tried any over-the-counter hemorrhoidal remedies.  Follow-up visit 12/29/2022 Patient is here for follow-up of fatty liver.  She does not have any GI concerns today.  Unfortunately, she continues to gain weight.  She underwent colonoscopy for evaluation of rectal bleeding, found to have prolapsed internal and external hemorrhoids.  She is no longer experiencing rectal bleeding.  Reports occasional flareup of hemorrhoids.  She is not interested in hemorrhoid banding at this time  Follow-up visit 06/30/2023 Diana [name] is here for follow-up of Elita Boone fibrosis.  She was also seen by transplant hepatology service by [facility] in [place] on 2/14.  FibroScan is pending.  Underwent right upper quadrant ultrasound which revealed diffuse hepatic steatosis only.  Serum AFP levels were mildly elevated.  Rest of the secondary liver disease workup came back normal.  She is scheduled to undergo FibroScan next month.  She started [facility] about 4 weeks ago, reports decreased appetite and tolerating the medication well after second shot.  Otherwise, she does not have any other concerns today  NSAIDs: she is no longer taking NSAIDs  Antiplts/Anticoagulants/Anti thrombotics: none  GI Procedures:  Colonoscopy 11/10/2021 - Hemorrhoids found on perianal exam. - The examined portion of the ileum was normal. - Non- bleeding external and internal hemorrhoids. - The examination was otherwise normal. - No specimens collected. She denies family  history of colon cancer  Ultrasound-guided liver biopsy 07/27/2017 DIAGNOSIS:  A. LIVER; ULTRASOUND-GUIDED CORE BIOPSY:  - HEPATITIC PROCESS WITH MILD TO MODERATE PORTAL AND LOBULAR  INFLAMMATION, CONSISTENT WITH VIRAL HEPATITIS B, SEE COMMENT.  - NEGATIVE FOR FIBROSIS.  - NEGATIVE FOR STEATOSIS.   Past Medical History:   Diagnosis Date   Allergy    Ear infection    recurrent   Hypertension     Past Surgical History:  Procedure Laterality Date   ABDOMINAL HYSTERECTOMY  2019   partial   COLONOSCOPY WITH PROPOFOL N/A 11/10/2021   Procedure: COLONOSCOPY WITH PROPOFOL;  Surgeon: Toney Reil, MD;  Location: [facility] ENDOSCOPY;  Service: Gastroenterology;  Laterality: N/A;   TONSILLECTOMY     TUBAL LIGATION     bilateral     Current Outpatient Medications:    Ascorbic Acid 125 MG CHEW, Chew 1 each by mouth daily., Disp: , Rfl:    atorvastatin (LIPITOR) 10 MG tablet, Take 1 tablet by mouth once daily, Disp: 90 tablet, Rfl: 1   cetirizine (ZYRTEC) 10 MG tablet, Take 10 mg by mouth daily as needed., Disp: , Rfl:    Cholecalciferol (VITAMIN D3) 25 MCG (1000 UT) CAPS, Take 5,000 Units by mouth. , Disp: , Rfl:    clonazePAM (KLONOPIN) 0.5 MG tablet, Take 1 tablet (0.5 mg total) by mouth daily as needed. for anxiety, Disp: 30 tablet, Rfl: 0   gabapentin (NEURONTIN) 300 MG capsule, TAKE 1 CAPSULE (300 MG TOTAL) BY MOUTH AT BEDTIME., Disp: 30 capsule, Rfl: 2   hydrocortisone (ANUSOL-HC) 2.5 % rectal cream, Place 1 Application rectally 2 (two) times daily., Disp: 30 g, Rfl: 0   ibuprofen (ADVIL) 200 MG tablet, Take 200 mg by mouth every 6 (six) hours as needed., Disp: , Rfl:    losartan-hydrochlorothiazide (HYZAAR) 50-12.5 MG tablet, Take 1 tablet by mouth once daily, Disp: 90 tablet, Rfl: 0   methocarbamol (ROBAXIN) 500 MG tablet, Take 1 tablet (500 mg total) by mouth every 8 (eight) hours as needed for muscle spasms., Disp: 30 tablet, Rfl: 0   omeprazole (PRILOSEC) 40 MG capsule, TAKE 1 CAPSULE BY MOUTH 2 TIMES DAILY., Disp: 60 capsule, Rfl: 1   tirzepatide (MOUNJARO) 2.5 MG/0.5ML Pen, Inject 2.5 mg into the skin once a week., Disp: , Rfl:    Family History  Problem Relation Age of Onset   Hypertension Mother        alive   Arthritis Mother    Thyroid disease Mother    Lung cancer Father         deceased   Heart disease Father    Hypertension Maternal Grandmother      Social History   Tobacco Use   Smoking status: Former    Current packs/day: 0.00    Types: Cigarettes    Quit date: 04/20/1985    Years since quitting: 38.2    Passive exposure: Past   Smokeless tobacco: Never   Tobacco comments:    remote tobacco use   Vaping Use   Vaping status: Never Used  Substance Use Topics   Alcohol use: No    Alcohol/week: 0.0 standard drinks of alcohol   Drug use: No    Allergies as of 06/30/2023   (No Known Allergies)    Review of Systems:    All systems reviewed and negative except where noted in HPI.   Physical Exam:  BP 119/76 (BP Location: Left Arm, Patient Position: Sitting, Cuff Size: Large)   Pulse 81  Temp 98.6 F (37 C) (Oral)   Ht 5\' 7"  (1.702 m)   Wt 232 lb 4 oz (105.3 kg)   LMP 01/20/2016   BMI 36.38 kg/m  Patient's last menstrual period was 01/20/2016.  General:   Alert,  Well-developed, well-nourished, pleasant and cooperative in NAD Head:  Normocephalic and atraumatic. Eyes:  Sclera clear, no icterus.   Conjunctiva pink. Ears:  Normal auditory acuity. Nose:  No deformity, discharge, or lesions. Mouth:  No deformity or lesions,oropharynx pink & moist. Neck:  Supple; no masses or thyromegaly. Lungs:  Respirations even and unlabored.  Clear throughout to auscultation.   No wheezes, crackles, or rhonchi. No acute distress. Heart:  Regular rate and rhythm; no murmurs, clicks, rubs, or gallops. Abdomen:  Normal bowel sounds. Soft, obese, non-tender and non-distended without masses, hepatosplenomegaly or hernias noted.  No guarding or rebound tenderness.   Rectal: Not performed Msk:  Symmetrical without gross deformities. Good, equal movement & strength bilaterally. Pulses:  Normal pulses noted. Extremities:  No clubbing or edema.  No cyanosis. Neurologic:  Alert and oriented x3;  grossly normal neurologically. Skin:  Intact without significant lesions  or rashes. No jaundice. Psych:  Alert and cooperative. Normal mood and affect.  Imaging Studies: Last ultrasound abdomen in 05/2015 which was unremarkable  Ultrasound abdomen 06/2017 unremarkable with no evidence of cirrhosis and no portal hypertension  Assessment and Plan:   Diana [name] is a 63 y.o. female with obesity, hypertension, GERD is here for follow-up of chronic hepatitis B infection. Her hepatitis D antigen is negative. She does not have clinical signs or symptoms to suggest chronic liver disease. There is no evidence of portal hypertension. Secondary liver disease workup revealed weakly positive anti-smooth muscle antibodies. She does not have hep C. Status post liver biopsy.  Patient did not tolerate viread, developed stomach upset and myalgias.  Therefore, treated with entecavir which was stopped in 07/2019.  She is here for follow-up of fatty liver/NASH fibrosis  Fatty liver, Nash fibrosis  Fibrosis 4 Score = .8 (Low risk)         Interpretation for patients with HCV          <1.45       -  F0-F1 (Low risk)          1.45-3.25 -  Indeterminate           >3.25      -  F3-F4 (High risk)     Validated for ages 80-65 Repeat Nash fibrosis panel showed F3/F4 Evaluated by transplant hepatology service at [facility], FibroScan is pending Continue Mounjaro   Painless rectal bleeding Underwent colonoscopy, found to have large internal and external hemorrhoids Reports occasional hemorrhoidal symptoms Offered her outpatient hemorrhoid ligation as a treatment, she would like to defer at this time    Chronic hepatitis B infection, currently seroconverted, E antigen negative, E antibody positive and HBV DNA undetected as of 07/2021 -Continue to hold off on antiviral therapy at this time - Recheck LFTs, HBV DNA every 6 months - Hepatitis B e antigen, hepatitis B e antibody every 6 months - HBsAg annually - to avoid alcohol use, limit intake of NSAIDs and Tylenol use for  arthralgias   Follow up in 6 months   [name], MD

## 2023-07-01 ENCOUNTER — Telehealth: Payer: Self-pay

## 2023-07-01 ENCOUNTER — Encounter: Payer: Self-pay | Admitting: Internal Medicine

## 2023-07-01 MED ORDER — TIRZEPATIDE 5 MG/0.5ML ~~LOC~~ SOAJ: 5.0000 mg | SUBCUTANEOUS | 0 refills | Status: DC

## 2023-07-01 NOTE — Telephone Encounter (Signed)
 Patient is calling to report that there was old insurance noted with the PA. Insurance is requesting to waiting 2-24 hours before resubmitting the PA.

## 2023-07-01 NOTE — Telephone Encounter (Signed)
 DEVON KINGDON [facility] ([employer] [facility]) Rx #: [id number] Need Help? Call us at [phone] Status New (Not sent to plan) Drug Mounjaro 5MG /0.5ML auto-injectors ePA cloud logo Form [employer] Electronic Prior Authorization Form Original Claim Info 75

## 2023-07-07 ENCOUNTER — Telehealth: Payer: Self-pay

## 2023-07-07 NOTE — Telephone Encounter (Signed)
 Copied from CRM [id number]. Topic: Clinical - Medication Question >> Jul 07, 2023 10:31 AM Fredrich Romans wrote: Reason for CRM: Patient called in to check on the status of PA being resubmitted with correct insurance [employer] medicaid? She hasn't heard from anyone on the status and she's suppose to take an injection on Friday 07/09/2023.

## 2023-07-08 ENCOUNTER — Telehealth: Payer: Self-pay | Admitting: Internal Medicine

## 2023-07-08 MED ORDER — OZEMPIC (0.25 OR 0.5 MG/DOSE) 2 MG/3ML ~~LOC~~ SOPN: 0.5000 mg | PEN_INJECTOR | SUBCUTANEOUS | 0 refills | Status: DC

## 2023-07-08 NOTE — Telephone Encounter (Signed)
 Ozempic sent to pharmacy

## 2023-07-08 NOTE — Addendum Note (Signed)
 Addended by: [name] on: 07/08/2023 03:32 PM   Modules accepted: Orders

## 2023-07-08 NOTE — Telephone Encounter (Signed)
 Copied from CRM [id number]. Topic: Clinical - Medication Question >> Jul 08, 2023  1:19 PM Shardie S wrote: Reason for CRM: Patient states that her insurance company is denying medication, tirzepatide (MOUNJARO) 5 MG/0.5ML Pen. Patient request a callback to discuss next steps. Callback # [phone]

## 2023-07-08 NOTE — Telephone Encounter (Signed)
 The closest one is ozempic. She will not have as much weight loss with the other medications but they all will help control her blood sugar.

## 2023-07-08 NOTE — Telephone Encounter (Signed)
 Was it put on there that she had been on metformin?  Did they prefer Ozempic?

## 2023-07-09 ENCOUNTER — Telehealth: Payer: Self-pay

## 2023-07-09 NOTE — Telephone Encounter (Signed)
 FALLYNN GRAVETT [facility] ([employer] [facility]) Rx #: [id number] Ozempic (0.25 or 0.5 MG/DOSE) 2MG /3ML pen-injectors Form [employer] Electronic Prior Authorization Form

## 2023-07-12 DIAGNOSIS — B181 Chronic viral hepatitis B without delta-agent: Secondary | ICD-10-CM | POA: Diagnosis not present

## 2023-07-12 DIAGNOSIS — K7581 Nonalcoholic steatohepatitis (NASH): Secondary | ICD-10-CM | POA: Diagnosis not present

## 2023-07-12 NOTE — Telephone Encounter (Signed)
 Approved. OZEMPIC (0.25 OR 0.5 MG/DOSE) 2MG /3ML Soln Pen-inj is approved from 07/09/2023 to 07/08/2024.Marland Kitchen Authorization Expiration Date: July 08, 2024.

## 2023-07-14 ENCOUNTER — Other Ambulatory Visit: Payer: Self-pay | Admitting: Internal Medicine

## 2023-07-14 ENCOUNTER — Encounter: Payer: Self-pay | Admitting: Internal Medicine

## 2023-07-14 DIAGNOSIS — K219 Gastro-esophageal reflux disease without esophagitis: Secondary | ICD-10-CM

## 2023-07-15 ENCOUNTER — Telehealth: Payer: Self-pay

## 2023-07-15 MED ORDER — METHOCARBAMOL 500 MG PO TABS: 500.0000 mg | ORAL_TABLET | Freq: Three times a day (TID) | ORAL | 0 refills | Status: DC | PRN

## 2023-07-15 MED ORDER — CLONAZEPAM 0.5 MG PO TABS
0.5000 mg | ORAL_TABLET | Freq: Every day | ORAL | 0 refills | Status: DC | PRN
Start: 2023-07-15 — End: 2023-12-02

## 2023-07-15 MED ORDER — TRAMADOL HCL 50 MG PO TABS: 50.0000 mg | ORAL_TABLET | Freq: Every day | ORAL | 0 refills | Status: AC | PRN

## 2023-07-15 NOTE — Telephone Encounter (Signed)
 ZYKIRA MATLACK [facility] ([employer] [facility]) Rx #: [id number] Need Help? Call us at [phone] Status New (Not sent to plan) Drug traMADol HCl 50MG  tablets ePA cloud logo Form [employer] Electronic Prior Authorization Form Original Claim Info 7X Days Supply Exceeds Plan Limitation

## 2023-07-16 NOTE — Telephone Encounter (Signed)
 Denied on March 27 by [employer] 2017 Denied

## 2023-07-27 NOTE — Progress Notes (Unsigned)
 Referring Physician:  [name], NP [address] [place],  [place] [address]  Primary Physician:  [name], NP  History of Present Illness: Diana [name] has a history of HTN, GERD, chronic hep B, hyperlipidemia, DM, and prediabetes.   History of chronic constant LBP with right leg pain with flare ups. She has known lumbar spondylosis with disc bulging at L4-S1. Mild central stenosis L4-L5.   Last seen by me on 02/01/23- EMG of bilateral lower extremities was ordered but not done. She was to follow up with PMR to discuss injections if pain got worse.   She has more constant LBP in last few weeks. She has intermittent burning pain in her buttocks with sitting. She has intermittent right > left posterior leg pain. She still has some numbness in right lateral foot and feels some numbness in bilateral lateral legs as well. No weakness in her legs. She has difficulty getting up from laying in bed due to pain/stiffness.   She is taking neurontin at night. She is taking prn motrin and robaxin. Not doing HEP I gave previously.   She has been out of work since July.   She does not smoke. She quit in 1987.    Bowel/Bladder Dysfunction: none  Conservative measures:  Physical therapy: did 4 years ago and made her worse Multimodal medical therapy including regular antiinflammatories: prednisone taper, robaxin, mobic, tramadol  Injections:  Had 9-10 years ago with no relief   Past Surgery: No spinal surgery   Review of Systems:  A 10 point review of systems is negative, except for the pertinent positives and negatives detailed in the HPI.  Past Medical History: Past Medical History:  Diagnosis Date   Allergy    Ear infection    recurrent   Hypertension     Past Surgical History: Past Surgical History:  Procedure Laterality Date   ABDOMINAL HYSTERECTOMY  2019   partial   COLONOSCOPY WITH PROPOFOL N/A 11/10/2021   Procedure: COLONOSCOPY WITH PROPOFOL;  Surgeon: Toney Reil, MD;  Location: [facility] ENDOSCOPY;  Service: Gastroenterology;  Laterality: N/A;   TONSILLECTOMY     TUBAL LIGATION     bilateral    Allergies: Allergies as of 07/28/2023   (No Known Allergies)    Medications: Outpatient Encounter Medications as of 07/28/2023  Medication Sig   Ascorbic Acid 125 MG CHEW Chew 1 each by mouth daily.   atorvastatin (LIPITOR) 10 MG tablet Take 1 tablet by mouth once daily   cetirizine (ZYRTEC) 10 MG tablet Take 10 mg by mouth daily as needed.   Cholecalciferol (VITAMIN D3) 25 MCG (1000 UT) CAPS Take 5,000 Units by mouth.    clonazePAM (KLONOPIN) 0.5 MG tablet Take 1 tablet (0.5 mg total) by mouth daily as needed. for anxiety   gabapentin (NEURONTIN) 300 MG capsule TAKE 1 CAPSULE (300 MG TOTAL) BY MOUTH AT BEDTIME.   hydrocortisone (ANUSOL-HC) 2.5 % rectal cream Place 1 Application rectally 2 (two) times daily.   ibuprofen (ADVIL) 200 MG tablet Take 200 mg by mouth every 6 (six) hours as needed.   losartan-hydrochlorothiazide (HYZAAR) 50-12.5 MG tablet Take 1 tablet by mouth once daily   methocarbamol (ROBAXIN) 500 MG tablet Take 1 tablet (500 mg total) by mouth every 8 (eight) hours as needed for muscle spasms.   omeprazole (PRILOSEC) 40 MG capsule TAKE 1 CAPSULE BY MOUTH 2 TIMES DAILY.   OZEMPIC, 0.25 OR 0.5 MG/DOSE, 2 MG/3ML SOPN Inject 0.5 mg into the  skin once a week. Start with 0.25mg  weekly x 4 weeks then increase to 0.5mg  weekly injection.   traMADol (ULTRAM) 50 MG tablet Take 1 tablet (50 mg total) by mouth daily as needed.   No facility-administered encounter medications on file as of 07/28/2023.    Social History: Social History   Tobacco Use   Smoking status: Former    Current packs/day: 0.00    Types: Cigarettes    Quit date: 04/20/1985    Years since quitting: 38.2    Passive exposure: Past   Smokeless tobacco: Never   Tobacco comments:    remote tobacco use   Vaping Use   Vaping status: Never Used  Substance Use Topics    Alcohol use: No    Alcohol/week: 0.0 standard drinks of alcohol   Drug use: No    Family Medical History: Family History  Problem Relation Age of Onset   Hypertension Mother        alive   Arthritis Mother    Thyroid disease Mother    Lung cancer Father        deceased   Heart disease Father    Hypertension Maternal Grandmother     Physical Examination: There were no vitals filed for this visit.  Awake, alert, oriented to person, place, and time.  Speech is clear and fluent. Fund of knowledge is appropriate.   Cranial Nerves: Pupils equal round and reactive to light.  Facial tone is symmetric.    No abnormal lesions on exposed skin.   She has diffuse lower lumbar tenderness.   Strength:  Side Iliopsoas Quads Hamstring PF DF EHL  R 5 5 5 5 5 5   L 5 5 5 5 5 5    Reflexes are 2+ and symmetric at the patella and achilles.    Clonus is not present.   Bilateral lower extremity sensation is intact to light touch.   Gait is normal.    Medical Decision Making  Imaging: None   Assessment and Plan: Diana [name] is a pleasant 63 y.o. female with a history of chronic back and leg pain with intermittent flare ups.   She's had flare up for last 3 weeks or so with more constant LBP along with pain/burning in buttocks that is worse with sitting. She has intermittent right > left posterior leg pain. She still has some numbness in right lateral foot and feels some numbness in bilateral lateral legs as well. No weakness in her legs.    She has know lumbar spondylosis with disc bulging at L4-S1. Mild central stenosis L4-L5.    Treatment options discussed with patient and following plan made:   - PT for lumbar spine. Orders to [facility] in [place].  - Continue current medications including prn motrin and robaxin. Reviewed dosing and side effects. Take motrin with food. - Continue on neurotin at night.  - She wants to hold on EMG of bilateral lower extremities for now. May  revisit.  - Discussed possible injections with PMR. She declines for now.  - Follow up with me in 6-8 weeks and prn.   I spent a total of 20 minutes in face-to-face and non-face-to-face activities related to this patient's care today including review of outside records, review of imaging, review of symptoms, physical exam, discussion of differential diagnosis, discussion of treatment options, and documentation.   Drake Leach PA-C Dept. of Neurosurgery

## 2023-07-28 ENCOUNTER — Ambulatory Visit: Admitting: Internal Medicine

## 2023-07-28 ENCOUNTER — Encounter: Payer: Self-pay | Admitting: Orthopedic Surgery

## 2023-07-28 ENCOUNTER — Ambulatory Visit (INDEPENDENT_AMBULATORY_CARE_PROVIDER_SITE_OTHER): Admitting: Orthopedic Surgery

## 2023-07-28 ENCOUNTER — Encounter: Payer: Self-pay | Admitting: Internal Medicine

## 2023-07-28 VITALS — BP 130/74 | Ht 67.0 in | Wt 229.6 lb

## 2023-07-28 VITALS — BP 132/88 | Ht 67.0 in

## 2023-07-28 DIAGNOSIS — K760 Fatty (change of) liver, not elsewhere classified: Secondary | ICD-10-CM

## 2023-07-28 DIAGNOSIS — M48061 Spinal stenosis, lumbar region without neurogenic claudication: Secondary | ICD-10-CM | POA: Diagnosis not present

## 2023-07-28 DIAGNOSIS — M47816 Spondylosis without myelopathy or radiculopathy, lumbar region: Secondary | ICD-10-CM

## 2023-07-28 DIAGNOSIS — M5127 Other intervertebral disc displacement, lumbosacral region: Secondary | ICD-10-CM | POA: Diagnosis not present

## 2023-07-28 DIAGNOSIS — M5416 Radiculopathy, lumbar region: Secondary | ICD-10-CM

## 2023-07-28 DIAGNOSIS — R140 Abdominal distension (gaseous): Secondary | ICD-10-CM

## 2023-07-28 DIAGNOSIS — M4726 Other spondylosis with radiculopathy, lumbar region: Secondary | ICD-10-CM | POA: Diagnosis not present

## 2023-07-28 DIAGNOSIS — K5903 Drug induced constipation: Secondary | ICD-10-CM | POA: Diagnosis not present

## 2023-07-28 DIAGNOSIS — T887XXA Unspecified adverse effect of drug or medicament, initial encounter: Secondary | ICD-10-CM

## 2023-07-28 NOTE — Progress Notes (Signed)
 Subjective:    Patient ID: Diana [name], female    DOB: 1960-07-11, 63 y.o.   MRN: [id number]  HPI   Discussed the use of AI scribe software for clinical note transcription with the patient, who gave verbal consent to proceed.  Diana [name] is a 63 year old female who presents with abdominal bloating and gastrointestinal symptoms.  She experiences significant abdominal bloating localized to the stomach area, which was notably protruding for several days. This symptom is associated with her recent use of Ozempic, a medication she switched to due to insurance issues with [facility]. The bloating has since resolved as of this morning. She has a history of gastrointestinal issues, typically experiencing bloating that is 'all over' and 'rolling all around'. However, this recent episode was different, being localized to the stomach area. No current nausea or stomach pain with the use of Ozempic, which she administers weekly on Wednesdays.  She experiences constipation, having bowel movements once or twice a week, which are hard. She has not been using any laxatives like Miralax to manage this symptom. Her last bowel movement was either yesterday or the day before.  She is currently on a 0.25 mg dose of Ozempic. She notes a weight loss of 11 pounds since February 2024, indicating the medication's effectiveness in weight management.        Review of Systems     Past Medical History:  Diagnosis Date   Allergy    Ear infection    recurrent   Hypertension     Current Outpatient Medications  Medication Sig Dispense Refill   Ascorbic Acid 125 MG CHEW Chew 1 each by mouth daily.     atorvastatin (LIPITOR) 10 MG tablet Take 1 tablet by mouth once daily 90 tablet 1   cetirizine (ZYRTEC) 10 MG tablet Take 10 mg by mouth daily as needed.     Cholecalciferol (VITAMIN D3) 25 MCG (1000 UT) CAPS Take 5,000 Units by mouth.      clonazePAM (KLONOPIN) 0.5 MG tablet Take 1 tablet (0.5 mg total) by  mouth daily as needed. for anxiety 30 tablet 0   gabapentin (NEURONTIN) 300 MG capsule TAKE 1 CAPSULE (300 MG TOTAL) BY MOUTH AT BEDTIME. 30 capsule 2   hydrocortisone (ANUSOL-HC) 2.5 % rectal cream Place 1 Application rectally 2 (two) times daily. 30 g 0   ibuprofen (ADVIL) 200 MG tablet Take 200 mg by mouth every 6 (six) hours as needed.     losartan-hydrochlorothiazide (HYZAAR) 50-12.5 MG tablet Take 1 tablet by mouth once daily 90 tablet 0   methocarbamol (ROBAXIN) 500 MG tablet Take 1 tablet (500 mg total) by mouth every 8 (eight) hours as needed for muscle spasms. 30 tablet 0   omeprazole (PRILOSEC) 40 MG capsule TAKE 1 CAPSULE BY MOUTH 2 TIMES DAILY. 60 capsule 1   OZEMPIC, 0.25 OR 0.5 MG/DOSE, 2 MG/3ML SOPN Inject 0.5 mg into the skin once a week. Start with 0.25mg  weekly x 4 weeks then increase to 0.5mg  weekly injection. 9 mL 0   traMADol (ULTRAM) 50 MG tablet Take 1 tablet (50 mg total) by mouth daily as needed. 30 tablet 0   No current facility-administered medications for this visit.    No Known Allergies  Family History  Problem Relation Age of Onset   Hypertension Mother        alive   Arthritis Mother    Thyroid disease Mother    Lung cancer Father  deceased   Heart disease Father    Hypertension Maternal Grandmother     Social History   Socioeconomic History   Marital status: Widowed    Spouse name: Not on file   Number of children: 3   Years of education: Not on file   Highest education level: 12th grade  Occupational History   Occupation: customer service    Employer: [employer]  Tobacco Use   Smoking status: Former    Current packs/day: 0.00    Types: Cigarettes    Quit date: 04/20/1985    Years since quitting: 38.2    Passive exposure: Past   Smokeless tobacco: Never   Tobacco comments:    remote tobacco use   Vaping Use   Vaping status: Never Used  Substance and Sexual Activity   Alcohol use: No    Alcohol/week: 0.0 standard drinks of  alcohol   Drug use: No   Sexual activity: Not Currently  Other Topics Concern   Not on file  Social History Narrative   Not on file   Social Drivers of Health   Financial Resource Strain: Low Risk  (05/20/2023)   Overall Financial Resource Strain (CARDIA)    Difficulty of Paying Living Expenses: Not hard at all  Food Insecurity: Low Risk  (06/04/2023)   Received from [facility]   Hunger Vital Sign    Worried About Running Out of Food in the Last Year: Never true    Ran Out of Food in the Last Year: Never true  Transportation Needs: No Transportation Needs (06/04/2023)   Received from [facility]    In the past 12 months, has lack of reliable transportation kept you from medical appointments, meetings, work or from getting things needed for daily living? : No  Physical Activity: Unknown (05/20/2023)   Exercise Vital Sign    Days of Exercise per Week: 0 days    Minutes of Exercise per Session: Not on file  Stress: Stress Concern Present (05/20/2023)   [employer] of Occupational Health - Occupational Stress Questionnaire    Feeling of Stress : To some extent  Social Connections: Moderately Isolated (05/20/2023)   Social Connection and Isolation Panel [NHANES]    Frequency of Communication with Friends and Family: More than three times a week    Frequency of Social Gatherings with Friends and Family: Once a week    Attends Religious Services: Never    Database administrator or Organizations: No    Attends Engineer, structural: Not on file    Marital Status: Living with partner  Intimate Partner Violence: Not on file     Constitutional: Denies fever, malaise, fatigue, headache or abrupt weight changes.  HEENT: Denies eye pain, eye redness, ear pain, ringing in the ears, wax buildup, runny nose, nasal congestion, bloody nose, or sore throat. Respiratory: Denies difficulty breathing, shortness of breath, cough or sputum production.    Cardiovascular: Denies chest pain, chest tightness, palpitations or swelling in the hands or feet.  Gastrointestinal: Pt reports nausea, RUQ abdominal pain, bloating, constipation. Denies bloating or blood in the stool.  GU: Denies urgency, frequency, pain with urination, burning sensation, blood in urine, odor or discharge. Musculoskeletal: Patient reports chronic low back pain, left knee pain.  Denies decrease in range of motion, difficulty with gait, or joint swelling.  Skin: Denies redness, rashes, lesions or ulcercations.  Neurological: Patient reports insomnia.  Denies dizziness, difficulty with memory, difficulty with speech or problems  with balance and coordination.  Psych: Patient has history of anxiety.  Denies depression, SI/HI.  No other specific complaints in a complete review of systems (except as listed in HPI above).  Objective:   Physical Exam  BP 130/74 (BP Location: Left Arm, Patient Position: Sitting, Cuff Size: Large)   Ht 5\' 7"  (1.702 m)   Wt 229 lb 9.6 oz (104.1 kg)   LMP 01/20/2016   BMI 35.96 kg/m    Wt Readings from Last 3 Encounters:  06/30/23 232 lb 4 oz (105.3 kg)  06/24/23 234 lb (106.1 kg)  06/08/23 239 lb 3.2 oz (108.5 kg)    General: Appears her stated age, obese, in NAD. Skin: Warm, dry and intact. No ulcerations noted. Cardiovascular: Normal rate and rhythm.  Pulmonary/Chest: Normal effort and positive vesicular breath sounds. No respiratory distress. No wheezes, rales or ronchi noted.  Abdomen: Slightly distended but soft.  Hypoactive bowel sounds.  Nontender. Neurological: Alert and oriented. Coordination normal.    BMET    Component Value Date/Time   NA 140 05/21/2023 1115   K 4.0 05/21/2023 1115   CL 101 05/21/2023 1115   CO2 31 05/21/2023 1115   GLUCOSE 131 (H) 05/21/2023 1115   BUN 13 05/21/2023 1115   CREATININE 0.60 05/21/2023 1115   CALCIUM 9.5 05/21/2023 1115   GFRNONAA >60 07/02/2021 2000   GFRAA >60 07/27/2017 2153     Lipid Panel     Component Value Date/Time   CHOL 161 05/21/2023 1115   CHOL 151 12/29/2022 1359   TRIG 102 05/21/2023 1115   TRIG 109 12/29/2022 1359   HDL 23 (L) 05/21/2023 1115   CHOLHDL 7.0 (H) 05/21/2023 1115   VLDL 21.0 02/09/2019 1245   LDLCALC 118 (H) 05/21/2023 1115    CBC    Component Value Date/Time   WBC 4.7 05/21/2023 1115   RBC 4.70 05/21/2023 1115   HGB 14.2 05/21/2023 1115   HCT 43.5 05/21/2023 1115   PLT 248 05/21/2023 1115   MCV 92.6 05/21/2023 1115   MCH 30.2 05/21/2023 1115   MCHC 32.6 05/21/2023 1115   RDW 12.5 05/21/2023 1115   LYMPHSABS 1.1 07/28/2016 1232   MONOABS 0.4 07/28/2016 1232   EOSABS 0.0 07/28/2016 1232   BASOSABS 0.0 07/28/2016 1232    Hgb A1C Lab Results  Component Value Date   HGBA1C 6.8 (H) 05/21/2023           Assessment & Plan:   Assessment and Plan    Bloating, Constipation, Medication Side Effect Intermittent bloating and constipation likely due to Ozempic. Bloating resolved. Constipation persists with infrequent bowel movements and hard stools. - Recommend Miralax three times a week. - Advise Gas-X if bloating recurs.  Weight Management Ozempic effective for weight loss with 11-pound reduction since February 2024. Well tolerated except for bloating. - Continue Ozempic at 0.25 mg.       RTC in 1 months for your annual exam [name], NP

## 2023-07-28 NOTE — Patient Instructions (Signed)
 It was so nice to see you today. Thank you so much for coming in.    I sent physical therapy orders to [facility] in [place]. They are at [address], [address]. You can call them at [phone] if you don't hear from them to schedule your visit. They are located next to [employer] and near [place].   If pain gets worse we can consider injections. Can also revisit the EMG/nerve test.  I will see you back in 6-8 weeks. Please do not hesitate to call if you have any questions or concerns. You can also message me in MyChart.   Drake Leach PA-C [phone]     The physicians and staff at [facility] at [facility] are committed to providing excellent care. You may receive a survey asking for feedback about your experience at our office. We value you your feedback and appreciate you taking the time to to fill it out. The [facility] leadership team is also available to discuss your experience in person, feel free to contact us [phone].

## 2023-07-28 NOTE — Patient Instructions (Signed)
 Abdominal Bloating: What to Know  Belly bloating is when your belly may feel full, tight, or painful. It may also look bigger or swollen. This is also called abdominal bloating. Common causes of belly bloating include: Swallowing air. Trouble pooping (constipation). Problems digesting food. Lactose intolerance. This means you can't digest lactose normally, a natural sugar in dairy. Celiac disease. This means you can't digest gluten, a protein found in wheat). Slow movement of food in the stomach and small intestine. Eating too much. Irritable bowel syndrome (IBS). This affects the large intestine. Gastroesophageal reflux disease (GERD). This is when stomach acid goes back into the throat. Urinary retention. This is when the bladder can't be emptied all the way. Follow these instructions at home: Eating and drinking Avoid eating too much. Try not to swallow air while talking or eating. Avoid eating while lying down. Avoid foods and drinks that may trigger your bloating. Depending on the cause this may include: Foods that cause gas, such as broccoli, cabbage, cauliflower, and baked beans. Fizzy, or carbonated, drinks. Hard candy. Chewing gum. Dairy. Medicines Take your medicines only as told. Ask your provider if you should take a probiotic. Probiotics have good bacteria or yeast that can help with digestion. General instructions Exercise regularly to help with bloating from gas and any trouble with pooping. You may need to take steps to help treat or prevent trouble pooping, such as: Taking medicines to help you poop. Eating foods high in fiber, like beans, whole grains, and fresh fruits and vegetables. Drinking more fluids as told. Contact a health care provider if: You throw up or feel like you may throw up. You have watery poop, or diarrhea, that doesn't stop. You have belly pain that gets worse and medicines don't help. You are gaining or losing weight unexpectedly. Get help  right away if: You have chest pain. You have trouble breathing. You feel short of breath. You have trouble peeing. You have dark pee or pee that smells bad. You have blood in your poop or dark, tarry poop. These symptoms may be an emergency. Call 911 right away. Do not wait to see if the symptoms will go away. Do not drive yourself to the hospital. This information is not intended to replace advice given to you by your health care provider. Make sure you discuss any questions you have with your health care provider. Document Revised: 11/25/2022 Document Reviewed: 11/25/2022 Elsevier Patient Education  2024 [employer].

## 2023-08-12 DIAGNOSIS — M5459 Other low back pain: Secondary | ICD-10-CM | POA: Diagnosis not present

## 2023-08-19 DIAGNOSIS — N311 Reflex neuropathic bladder, not elsewhere classified: Secondary | ICD-10-CM | POA: Diagnosis not present

## 2023-08-19 DIAGNOSIS — R3915 Urgency of urination: Secondary | ICD-10-CM | POA: Diagnosis not present

## 2023-08-19 DIAGNOSIS — N3010 Interstitial cystitis (chronic) without hematuria: Secondary | ICD-10-CM | POA: Diagnosis not present

## 2023-08-24 DIAGNOSIS — M5459 Other low back pain: Secondary | ICD-10-CM | POA: Diagnosis not present

## 2023-08-27 DIAGNOSIS — M5459 Other low back pain: Secondary | ICD-10-CM | POA: Diagnosis not present

## 2023-08-30 ENCOUNTER — Encounter (HOSPITAL_COMMUNITY): Payer: Self-pay

## 2023-08-30 DIAGNOSIS — M5459 Other low back pain: Secondary | ICD-10-CM | POA: Diagnosis not present

## 2023-09-02 DIAGNOSIS — M5459 Other low back pain: Secondary | ICD-10-CM | POA: Diagnosis not present

## 2023-09-06 ENCOUNTER — Encounter: Payer: Self-pay | Admitting: Internal Medicine

## 2023-09-06 ENCOUNTER — Ambulatory Visit: Payer: Medicaid Other | Admitting: Internal Medicine

## 2023-09-06 VITALS — BP 128/74 | Ht 67.0 in | Wt 227.0 lb

## 2023-09-06 DIAGNOSIS — E66812 Obesity, class 2: Secondary | ICD-10-CM

## 2023-09-06 DIAGNOSIS — Z6835 Body mass index (BMI) 35.0-35.9, adult: Secondary | ICD-10-CM | POA: Diagnosis not present

## 2023-09-06 DIAGNOSIS — Z1231 Encounter for screening mammogram for malignant neoplasm of breast: Secondary | ICD-10-CM | POA: Diagnosis not present

## 2023-09-06 DIAGNOSIS — E119 Type 2 diabetes mellitus without complications: Secondary | ICD-10-CM | POA: Diagnosis not present

## 2023-09-06 DIAGNOSIS — Z0001 Encounter for general adult medical examination with abnormal findings: Secondary | ICD-10-CM

## 2023-09-06 DIAGNOSIS — Z7985 Long-term (current) use of injectable non-insulin antidiabetic drugs: Secondary | ICD-10-CM | POA: Diagnosis not present

## 2023-09-06 LAB — COMPREHENSIVE METABOLIC PANEL WITH GFR
AG Ratio: 1.8 (calc) (ref 1.0–2.5)
ALT: 14 U/L (ref 6–29)
AST: 15 U/L (ref 10–35)
Albumin: 4.4 g/dL (ref 3.6–5.1)
Alkaline phosphatase (APISO): 81 U/L (ref 37–153)
BUN: 12 mg/dL (ref 7–25)
CO2: 31 mmol/L (ref 20–32)
Calcium: 9.3 mg/dL (ref 8.6–10.4)
Chloride: 96 mmol/L — ABNORMAL LOW (ref 98–110)
Creat: 0.74 mg/dL (ref 0.50–1.05)
Globulin: 2.4 g/dL (ref 1.9–3.7)
Glucose, Bld: 116 mg/dL — ABNORMAL HIGH (ref 65–99)
Potassium: 3.4 mmol/L — ABNORMAL LOW (ref 3.5–5.3)
Sodium: 139 mmol/L (ref 135–146)
Total Bilirubin: 1.8 mg/dL — ABNORMAL HIGH (ref 0.2–1.2)
Total Protein: 6.8 g/dL (ref 6.1–8.1)
eGFR: 93 mL/min/{1.73_m2} (ref >=60)

## 2023-09-06 LAB — CBC
HCT: 44.4 % (ref 35.0–45.0)
Hemoglobin: 14.5 g/dL (ref 11.7–15.5)
MCH: 30 pg (ref 27.0–33.0)
MCHC: 32.7 g/dL (ref 32.0–36.0)
MCV: 91.7 fL (ref 80.0–100.0)
MPV: 11.7 fL (ref 7.5–12.5)
Platelets: 229 10*3/uL (ref 140–400)
RBC: 4.84 10*6/uL (ref 3.80–5.10)
RDW: 13.2 % (ref 11.0–15.0)
WBC: 4.6 10*3/uL (ref 3.8–10.8)

## 2023-09-06 LAB — LIPID PANEL
Cholesterol: 147 mg/dL (ref <200)
HDL: 20 mg/dL — ABNORMAL LOW (ref >=50)
LDL Cholesterol (Calc): 107 mg/dL — ABNORMAL HIGH
Non-HDL Cholesterol (Calc): 127 mg/dL (ref <130)
Total CHOL/HDL Ratio: 7.4 (calc) — ABNORMAL HIGH (ref <5.0)
Triglycerides: 104 mg/dL (ref <150)

## 2023-09-06 LAB — HEMOGLOBIN A1C
Hgb A1c MFr Bld: 6.2 % — ABNORMAL HIGH (ref <5.7)
Mean Plasma Glucose: 131 mg/dL
eAG (mmol/L): 7.3 mmol/L

## 2023-09-06 MED ORDER — LOSARTAN POTASSIUM-HCTZ 50-12.5 MG PO TABS: 1.0000 | ORAL_TABLET | Freq: Every day | ORAL | 1 refills | Status: DC

## 2023-09-06 NOTE — Patient Instructions (Signed)
 Health Maintenance for Postmenopausal Women Menopause is a normal process in which your ability to get pregnant comes to an end. This process happens slowly over many months or years, usually between the ages of 24 and 62. Menopause is complete when you have missed your menstrual period for 12 months. It is important to talk with your health care provider about some of the most common conditions that affect women after menopause (postmenopausal women). These include heart disease, cancer, and bone loss (osteoporosis). Adopting a healthy lifestyle and getting preventive care can help to promote your health and wellness. The actions you take can also lower your chances of developing some of these common conditions. What are the signs and symptoms of menopause? During menopause, you may have the following symptoms: Hot flashes. These can be moderate or severe. Night sweats. Decrease in sex drive. Mood swings. Headaches. Tiredness (fatigue). Irritability. Memory problems. Problems falling asleep or staying asleep. Talk with your health care provider about treatment options for your symptoms. Do I need hormone replacement therapy? Hormone replacement therapy is effective in treating symptoms that are caused by menopause, such as hot flashes and night sweats. Hormone replacement carries certain risks, especially as you become older. If you are thinking about using estrogen or estrogen with progestin, discuss the benefits and risks with your health care provider. How can I reduce my risk for heart disease and stroke? The risk of heart disease, heart attack, and stroke increases as you age. One of the causes may be a change in the body's hormones during menopause. This can affect how your body uses dietary fats, triglycerides, and cholesterol. Heart attack and stroke are medical emergencies. There are many things that you can do to help prevent heart disease and stroke. Watch your blood pressure High  blood pressure causes heart disease and increases the risk of stroke. This is more likely to develop in people who have high blood pressure readings or are overweight. Have your blood pressure checked: Every 3-5 years if you are 50-75 years of age. Every year if you are 77 years old or older. Eat a healthy diet  Eat a diet that includes plenty of vegetables, fruits, low-fat dairy products, and lean protein. Do not eat a lot of foods that are high in solid fats, added sugars, or sodium. Get regular exercise Get regular exercise. This is one of the most important things you can do for your health. Most adults should: Try to exercise for at least 150 minutes each week. The exercise should increase your heart rate and make you sweat (moderate-intensity exercise). Try to do strengthening exercises at least twice each week. Do these in addition to the moderate-intensity exercise. Spend less time sitting. Even light physical activity can be beneficial. Other tips Work with your health care provider to achieve or maintain a healthy weight. Do not use any products that contain nicotine or tobacco. These products include cigarettes, chewing tobacco, and vaping devices, such as e-cigarettes. If you need help quitting, ask your health care provider. Know your numbers. Ask your health care provider to check your cholesterol and your blood sugar (glucose). Continue to have your blood tested as directed by your health care provider. Do I need screening for cancer? Depending on your health history and family history, you may need to have cancer screenings at different stages of your life. This may include screening for: Breast cancer. Cervical cancer. Lung cancer. Colorectal cancer. What is my risk for osteoporosis? After menopause, you may be  at increased risk for osteoporosis. Osteoporosis is a condition in which bone destruction happens more quickly than new bone creation. To help prevent osteoporosis or  the bone fractures that can happen because of osteoporosis, you may take the following actions: If you are 61-3 years old, get at least 1,000 mg of calcium and at least 600 international units (IU) of vitamin D per day. If you are older than age 61 but younger than age 75, get at least 1,200 mg of calcium and at least 600 international units (IU) of vitamin D per day. If you are older than age 62, get at least 1,200 mg of calcium and at least 800 international units (IU) of vitamin D per day. Smoking and drinking excessive alcohol increase the risk of osteoporosis. Eat foods that are rich in calcium and vitamin D, and do weight-bearing exercises several times each week as directed by your health care provider. How does menopause affect my mental health? Depression may occur at any age, but it is more common as you become older. Common symptoms of depression include: Feeling depressed. Changes in sleep patterns. Changes in appetite or eating patterns. Feeling an overall lack of motivation or enjoyment of activities that you previously enjoyed. Frequent crying spells. Talk with your health care provider if you think that you are experiencing any of these symptoms. General instructions See your health care provider for regular wellness exams and vaccines. This may include: Scheduling regular health, dental, and eye exams. Getting and maintaining your vaccines. These include: Influenza vaccine. Get this vaccine each year before the flu season begins. Pneumonia vaccine. Shingles vaccine. Tetanus, diphtheria, and pertussis (Tdap) booster vaccine. Your health care provider may also recommend other immunizations. Tell your health care provider if you have ever been abused or do not feel safe at home. Summary Menopause is a normal process in which your ability to get pregnant comes to an end. This condition causes hot flashes, night sweats, decreased interest in sex, mood swings, headaches, or lack  of sleep. Treatment for this condition may include hormone replacement therapy. Take actions to keep yourself healthy, including exercising regularly, eating a healthy diet, watching your weight, and checking your blood pressure and blood sugar levels. Get screened for cancer and depression. Make sure that you are up to date with all your vaccines. This information is not intended to replace advice given to you by your health care provider. Make sure you discuss any questions you have with your health care provider. Document Revised: 08/26/2020 Document Reviewed: 08/26/2020 Elsevier Patient Education  2024 [employer].

## 2023-09-06 NOTE — Progress Notes (Signed)
 Subjective:    Patient ID: Diana [name], female    DOB: 1960-08-27, 63 y.o.   MRN: [id number]  HPI  Patient presents to clinic today for annual exam.  Flu: Never Tetanus: 03/2015 COVID: Never Pneumovax: Never Shingrix: Never Pap smear: Hysterectomy Mammogram: 02/2023 Bone density: 01/2023 Colon screening: 10/2021 Vision screening: annually Dentist: biannually  Diet: She does eat meat. She consumes some fruits and veggies. She does eat some fried foods. She drinks mostly tea. Exercise: None  Review of Systems     Past Medical History:  Diagnosis Date   Allergy    Ear infection    recurrent   Hypertension     Current Outpatient Medications  Medication Sig Dispense Refill   Ascorbic Acid 125 MG CHEW Chew 1 each by mouth daily.     atorvastatin  (LIPITOR) 10 MG tablet Take 1 tablet by mouth once daily 90 tablet 1   cetirizine  (ZYRTEC ) 10 MG tablet Take 10 mg by mouth daily as needed.     Cholecalciferol (VITAMIN D3) 25 MCG (1000 UT) CAPS Take 5,000 Units by mouth.      clonazePAM  (KLONOPIN ) 0.5 MG tablet Take 1 tablet (0.5 mg total) by mouth daily as needed. for anxiety 30 tablet 0   gabapentin  (NEURONTIN ) 300 MG capsule TAKE 1 CAPSULE (300 MG TOTAL) BY MOUTH AT BEDTIME. 30 capsule 2   hydrocortisone  (ANUSOL -HC) 2.5 % rectal cream Place 1 Application rectally 2 (two) times daily. 30 g 0   ibuprofen  (ADVIL ) 200 MG tablet Take 200 mg by mouth every 6 (six) hours as needed.     losartan -hydrochlorothiazide  (HYZAAR) 50-12.5 MG tablet Take 1 tablet by mouth once daily 90 tablet 0   methocarbamol  (ROBAXIN ) 500 MG tablet Take 1 tablet (500 mg total) by mouth every 8 (eight) hours as needed for muscle spasms. 30 tablet 0   omeprazole  (PRILOSEC) 40 MG capsule TAKE 1 CAPSULE BY MOUTH 2 TIMES DAILY. 60 capsule 1   OZEMPIC , 0.25 OR 0.5 MG/DOSE, 2 MG/3ML SOPN Inject 0.5 mg into the skin once a week. Start with 0.25mg  weekly x 4 weeks then increase to 0.5mg  weekly injection. 9 mL 0    No current facility-administered medications for this visit.    No Known Allergies  Family History  Problem Relation Age of Onset   Hypertension Mother        alive   Arthritis Mother    Thyroid  disease Mother    Lung cancer Father        deceased   Heart disease Father    Hypertension Maternal Grandmother     Social History   Socioeconomic History   Marital status: Widowed    Spouse name: Not on file   Number of children: 3   Years of education: Not on file   Highest education level: 12th grade  Occupational History   Occupation: customer service    Employer: [employer]  Tobacco Use   Smoking status: Former    Current packs/day: 0.00    Types: Cigarettes    Quit date: 04/20/1985    Years since quitting: 38.4    Passive exposure: Past   Smokeless tobacco: Never   Tobacco comments:    remote tobacco use   Vaping Use   Vaping status: Never Used  Substance and Sexual Activity   Alcohol use: No    Alcohol/week: 0.0 standard drinks of alcohol   Drug use: No   Sexual activity: Not Currently  Other Topics Concern  Not on file  Social History Narrative   Not on file   Social Drivers of Health   Financial Resource Strain: Low Risk  (05/20/2023)   Overall Financial Resource Strain (CARDIA)    Difficulty of Paying Living Expenses: Not hard at all  Food Insecurity: Low Risk  (06/04/2023)   Received from [facility]   Hunger Vital Sign    Worried About Running Out of Food in the Last Year: Never true    Ran Out of Food in the Last Year: Never true  Transportation Needs: No Transportation Needs (06/04/2023)   Received from [facility]    In the past 12 months, has lack of reliable transportation kept you from medical appointments, meetings, work or from getting things needed for daily living? : No  Physical Activity: Unknown (05/20/2023)   Exercise Vital Sign    Days of Exercise per Week: 0 days    Minutes of Exercise per Session: Not on  file  Stress: Stress Concern Present (05/20/2023)   [employer] of Occupational Health - Occupational Stress Questionnaire    Feeling of Stress : To some extent  Social Connections: Moderately Isolated (05/20/2023)   Social Connection and Isolation Panel [NHANES]    Frequency of Communication with Friends and Family: More than three times a week    Frequency of Social Gatherings with Friends and Family: Once a week    Attends Religious Services: Never    Database administrator or Organizations: No    Attends Engineer, structural: Not on file    Marital Status: Living with partner  Intimate Partner Violence: Not on file     Constitutional: Denies fever, malaise, fatigue, headache or abrupt weight changes.  HEENT: Denies eye pain, eye redness, ear pain, ringing in the ears, wax buildup, runny nose, nasal congestion, bloody nose, or sore throat. Respiratory: Denies difficulty breathing, shortness of breath, cough or sputum production.   Cardiovascular: Denies chest pain, chest tightness, palpitations or swelling in the hands or feet.  Gastrointestinal: Patient reports constipation, intermittent RUQ pain.  Denies abdominal pain, bloating, diarrhea or blood in the stool.  GU: Denies urgency, frequency, pain with urination, burning sensation, blood in urine, odor or discharge. Musculoskeletal: Patient reports chronic back pain.  Denies decrease in range of motion, difficulty with gait, or joint swelling.  Skin: Denies redness, rashes, lesions or ulcercations.  Neurological: Pt reports paresthesia of right lower extremity. Denies dizziness, difficulty with memory, difficulty with speech or problems with balance and coordination.  Psych: Patient has a history of anxiety and depression.  Denies SI/HI.  No other specific complaints in a complete review of systems (except as listed in HPI above).  Objective:   Physical Exam   BP 128/74 (BP Location: Left Arm, Patient Position:  Sitting, Cuff Size: Large)   Ht 5\' 7"  (1.702 m)   Wt 227 lb (103 kg)   LMP 01/20/2016   BMI 35.55 kg/m    Wt Readings from Last 3 Encounters:  07/28/23 229 lb 9.6 oz (104.1 kg)  06/30/23 232 lb 4 oz (105.3 kg)  06/24/23 234 lb (106.1 kg)    General: Appears her stated age, obese, in NAD. Skin: Warm, dry and intact. No rashes noted. HEENT: Head: normal shape and size; Eyes: sclera white, no icterus, conjunctiva pink, PERRLA and EOMs intact;  Neck:  Neck supple, trachea midline. No masses, lumps or thyromegaly present.  Cardiovascular: Normal rate and rhythm. S1,S2 noted.  No murmur, rubs  or gallops noted. No JVD or BLE edema. No carotid bruits noted. Pulmonary/Chest: Normal effort and positive vesicular breath sounds. No respiratory distress. No wheezes, rales or ronchi noted.  Abdomen: Soft and nontender. Normal bowel sounds.  Musculoskeletal: Pain with palpation over the lumbar spine. Strength 5/5 BUE/BLE. No difficulty with gait.  Neurological: Alert and oriented. Cranial nerves II-XII grossly intact. Coordination normal.  Psychiatric: Mood and affect normal. Behavior is normal. Judgment and thought content normal.     BMET    Component Value Date/Time   NA 140 05/21/2023 1115   K 4.0 05/21/2023 1115   CL 101 05/21/2023 1115   CO2 31 05/21/2023 1115   GLUCOSE 131 (H) 05/21/2023 1115   BUN 13 05/21/2023 1115   CREATININE 0.60 05/21/2023 1115   CALCIUM  9.5 05/21/2023 1115   GFRNONAA >60 07/02/2021 2000   GFRAA >60 07/27/2017 2153    Lipid Panel     Component Value Date/Time   CHOL 161 05/21/2023 1115   CHOL 151 12/29/2022 1359   TRIG 102 05/21/2023 1115   TRIG 109 12/29/2022 1359   HDL 23 (L) 05/21/2023 1115   CHOLHDL 7.0 (H) 05/21/2023 1115   VLDL 21.0 02/09/2019 1245   LDLCALC 118 (H) 05/21/2023 1115    CBC    Component Value Date/Time   WBC 4.7 05/21/2023 1115   RBC 4.70 05/21/2023 1115   HGB 14.2 05/21/2023 1115   HCT 43.5 05/21/2023 1115   PLT 248  05/21/2023 1115   MCV 92.6 05/21/2023 1115   MCH 30.2 05/21/2023 1115   MCHC 32.6 05/21/2023 1115   RDW 12.5 05/21/2023 1115   LYMPHSABS 1.1 07/28/2016 1232   MONOABS 0.4 07/28/2016 1232   EOSABS 0.0 07/28/2016 1232   BASOSABS 0.0 07/28/2016 1232    Hgb A1C Lab Results  Component Value Date   HGBA1C 6.8 (H) 05/21/2023           Assessment & Plan:   Preventative Health Maintenance:  Encouraged her to get a flu shot in the fall Tetanus UTD Encouraged her to get her COVID booster Pneumovax declined Discussed Shingrix vaccine, she will check coverage with her insurance company and schedule a visit if she would like to have this done She no longer needs to screen for cervical cancer Mammogram ordered-she will call to schedule Bone density UTD Colon screening UTD Encouraged her to consume a balanced diet and exercise regimen Encouraged her to see an eye doctor and dentist annually We will check CBC, c-Met, lipid, A1c today   RTC in 6 months, follow-up chronic conditions Helayne Lo, NP

## 2023-09-06 NOTE — Assessment & Plan Note (Signed)
 Encourage diet and exercise for weight loss

## 2023-09-07 ENCOUNTER — Ambulatory Visit: Payer: Self-pay | Admitting: Internal Medicine

## 2023-09-07 DIAGNOSIS — M5459 Other low back pain: Secondary | ICD-10-CM | POA: Diagnosis not present

## 2023-09-14 ENCOUNTER — Other Ambulatory Visit: Payer: Self-pay

## 2023-09-14 DIAGNOSIS — R102 Pelvic and perineal pain: Secondary | ICD-10-CM

## 2023-09-14 DIAGNOSIS — E7800 Pure hypercholesterolemia, unspecified: Secondary | ICD-10-CM

## 2023-09-14 MED ORDER — ATORVASTATIN CALCIUM 10 MG PO TABS: 10.0000 mg | ORAL_TABLET | Freq: Every day | ORAL | 1 refills | Status: AC

## 2023-09-14 MED ORDER — POTASSIUM CHLORIDE ER 10 MEQ PO CPCR: 10.0000 meq | ORAL_CAPSULE | Freq: Every day | ORAL | 1 refills | Status: AC

## 2023-09-14 MED ORDER — OMEPRAZOLE 40 MG PO CPDR: 40.0000 mg | DELAYED_RELEASE_CAPSULE | Freq: Two times a day (BID) | ORAL | 1 refills | Status: DC

## 2023-09-15 ENCOUNTER — Ambulatory Visit: Admitting: Orthopedic Surgery

## 2023-10-01 NOTE — Progress Notes (Deleted)
 Referring Physician:  No referring provider defined for this encounter.  Primary Physician:  Carollynn Cirri, NP  History of Present Illness: [name] has a history of HTN, GERD, chronic hep B, hyperlipidemia, DM, and prediabetes.   History of chronic constant LBP with right leg pain with flare ups. She has known lumbar spondylosis with disc bulging at L4-S1. Mild central stenosis L4-L5.   Last seen by me on 07/28/23. She was sent to PT at [facility]- initial eval was on 08/12/23. She wanted to hold on EMG and injections with PMR.   She is here for follow up.       She has more constant LBP in last few weeks. She has intermittent burning pain in her buttocks with sitting. She has intermittent right > left posterior leg pain. She still has some numbness in right lateral foot and feels some numbness in bilateral lateral legs as well. No weakness in her legs. She has difficulty getting up from laying in bed due to pain/stiffness.   She is taking neurontin  at night. She is taking prn motrin  and robaxin . Not doing HEP I gave previously.   She has been out of work since July.   She does not smoke. She quit in 1987.    Bowel/Bladder Dysfunction: none  Conservative measures:  Physical therapy: [facility] initial eval on 08/12/23 Multimodal medical therapy including regular antiinflammatories: prednisone  taper, robaxin , mobic , tramadol   Injections:  Had 9-10 years ago with no relief   Past Surgery: No spinal surgery   Review of Systems:  A 10 point review of systems is negative, except for the pertinent positives and negatives detailed in the HPI.  Past Medical History: Past Medical History:  Diagnosis Date   Allergy    Ear infection    recurrent   Hypertension     Past Surgical History: Past Surgical History:  Procedure Laterality Date   ABDOMINAL HYSTERECTOMY  2019   partial   COLONOSCOPY WITH PROPOFOL  N/A 11/10/2021   Procedure: COLONOSCOPY WITH  PROPOFOL ;  Surgeon: Selena Daily, MD;  Location: [facility] ENDOSCOPY;  Service: Gastroenterology;  Laterality: N/A;   TONSILLECTOMY     TUBAL LIGATION     bilateral    Allergies: Allergies as of 10/06/2023   (No Known Allergies)    Medications: Outpatient Encounter Medications as of 10/06/2023  Medication Sig   Ascorbic Acid 125 MG CHEW Chew 1 each by mouth daily.   atorvastatin  (LIPITOR) 10 MG tablet Take 1 tablet (10 mg total) by mouth daily.   cetirizine  (ZYRTEC ) 10 MG tablet Take 10 mg by mouth daily as needed.   Cholecalciferol (VITAMIN D3) 25 MCG (1000 UT) CAPS Take 5,000 Units by mouth.    clonazePAM  (KLONOPIN ) 0.5 MG tablet Take 1 tablet (0.5 mg total) by mouth daily as needed. for anxiety   gabapentin  (NEURONTIN ) 300 MG capsule TAKE 1 CAPSULE (300 MG TOTAL) BY MOUTH AT BEDTIME.   hydrocortisone  (ANUSOL -HC) 2.5 % rectal cream Place 1 Application rectally 2 (two) times daily.   ibuprofen  (ADVIL ) 200 MG tablet Take 200 mg by mouth every 6 (six) hours as needed.   losartan -hydrochlorothiazide  (HYZAAR) 50-12.5 MG tablet Take 1 tablet by mouth daily.   methocarbamol  (ROBAXIN ) 750 MG tablet Take 750 mg by mouth 4 (four) times daily.   omeprazole  (PRILOSEC) 40 MG capsule Take 1 capsule (40 mg total) by mouth 2 (two) times daily.   oxybutynin (DITROPAN-XL) 10 MG 24 hr tablet Take 10 mg by mouth  daily.   OZEMPIC , 0.25 OR 0.5 MG/DOSE, 2 MG/3ML SOPN Inject 0.5 mg into the skin once a week. Start with 0.25mg  weekly x 4 weeks then increase to 0.5mg  weekly injection.   potassium chloride  (MICRO-K ) 10 MEQ CR capsule Take 1 capsule (10 mEq total) by mouth daily.   No facility-administered encounter medications on file as of 10/06/2023.    Social History: Social History   Tobacco Use   Smoking status: Former    Current packs/day: 0.00    Types: Cigarettes    Quit date: 04/20/1985    Years since quitting: 38.4    Passive exposure: Past   Smokeless tobacco: Never   Tobacco comments:     remote tobacco use   Vaping Use   Vaping status: Never Used  Substance Use Topics   Alcohol use: No   Drug use: No    Family Medical History: Family History  Problem Relation Age of Onset   Hypertension Mother        alive   Arthritis Mother    Thyroid  disease Mother    Lung cancer Father        deceased   Heart disease Father    Hypertension Maternal Grandmother     Physical Examination: There were no vitals filed for this visit.  Awake, alert, oriented to person, place, and time.  Speech is clear and fluent. Fund of knowledge is appropriate.   Cranial Nerves: Pupils equal round and reactive to light.  Facial tone is symmetric.    No abnormal lesions on exposed skin.   She has diffuse lower lumbar tenderness.   Strength:  Side Iliopsoas Quads Hamstring PF DF EHL  R 5 5 5 5 5 5   L 5 5 5 5 5 5    Reflexes are 2+ and symmetric at the patella and achilles.    Clonus is not present.   Bilateral lower extremity sensation is intact to light touch.   Gait is normal.    Medical Decision Making  Imaging: None   Assessment and Plan: [name] is a pleasant 63 y.o. female with a history of chronic back and leg pain with intermittent flare ups.   She's had flare up for last 3 weeks or so with more constant LBP along with pain/burning in buttocks that is worse with sitting. She has intermittent right > left posterior leg pain. She still has some numbness in right lateral foot and feels some numbness in bilateral lateral legs as well. No weakness in her legs.    She has know lumbar spondylosis with disc bulging at L4-S1. Mild central stenosis L4-L5.    Treatment options discussed with patient and following plan made:   - PT for lumbar spine. Orders to [facility] in [place].  - Continue current medications including prn motrin  and robaxin . Reviewed dosing and side effects. Take motrin  with food. - Continue on neurotin at night.  - She wants to hold on EMG of  bilateral lower extremities for now. May revisit.  - Discussed possible injections with PMR. She declines for now.  - Follow up with me in 6-8 weeks and prn.   I spent a total of 20 minutes in face-to-face and non-face-to-face activities related to this patient's care today including review of outside records, review of imaging, review of symptoms, physical exam, discussion of differential diagnosis, discussion of treatment options, and documentation.   Lucetta Russel PA-C Dept. of Neurosurgery

## 2023-10-06 ENCOUNTER — Other Ambulatory Visit (HOSPITAL_COMMUNITY): Payer: Self-pay

## 2023-10-06 ENCOUNTER — Ambulatory Visit: Admitting: Orthopedic Surgery

## 2023-10-15 ENCOUNTER — Encounter: Payer: Self-pay | Admitting: Internal Medicine

## 2023-10-15 ENCOUNTER — Other Ambulatory Visit: Payer: Self-pay | Admitting: Physician Assistant

## 2023-10-15 ENCOUNTER — Other Ambulatory Visit (HOSPITAL_COMMUNITY)
Admission: RE | Admit: 2023-10-15 | Discharge: 2023-10-15 | Disposition: A | Discharge status: Home / Self Care | Source: Ambulatory Visit | Attending: Internal Medicine | Admitting: Internal Medicine

## 2023-10-15 ENCOUNTER — Ambulatory Visit: Admitting: Internal Medicine

## 2023-10-15 VITALS — BP 128/72 | Ht 67.0 in | Wt 230.8 lb

## 2023-10-15 DIAGNOSIS — N3010 Interstitial cystitis (chronic) without hematuria: Secondary | ICD-10-CM | POA: Diagnosis not present

## 2023-10-15 DIAGNOSIS — E119 Type 2 diabetes mellitus without complications: Secondary | ICD-10-CM

## 2023-10-15 DIAGNOSIS — B9689 Other specified bacterial agents as the cause of diseases classified elsewhere: Secondary | ICD-10-CM | POA: Diagnosis not present

## 2023-10-15 DIAGNOSIS — N760 Acute vaginitis: Secondary | ICD-10-CM | POA: Insufficient documentation

## 2023-10-15 DIAGNOSIS — Z7985 Long-term (current) use of injectable non-insulin antidiabetic drugs: Secondary | ICD-10-CM

## 2023-10-15 DIAGNOSIS — M5416 Radiculopathy, lumbar region: Secondary | ICD-10-CM

## 2023-10-15 DIAGNOSIS — R350 Frequency of micturition: Secondary | ICD-10-CM | POA: Diagnosis not present

## 2023-10-15 DIAGNOSIS — R3915 Urgency of urination: Secondary | ICD-10-CM

## 2023-10-15 DIAGNOSIS — M47816 Spondylosis without myelopathy or radiculopathy, lumbar region: Secondary | ICD-10-CM

## 2023-10-15 DIAGNOSIS — R300 Dysuria: Secondary | ICD-10-CM | POA: Diagnosis not present

## 2023-10-15 DIAGNOSIS — Z6835 Body mass index (BMI) 35.0-35.9, adult: Secondary | ICD-10-CM

## 2023-10-15 DIAGNOSIS — E66812 Obesity, class 2: Secondary | ICD-10-CM

## 2023-10-15 DIAGNOSIS — M48061 Spinal stenosis, lumbar region without neurogenic claudication: Secondary | ICD-10-CM

## 2023-10-15 LAB — POCT URINE DIPSTICK
Spec Grav, UA: 1.015 (ref 1.010–1.025)
pH, UA: 6 (ref 5.0–8.0)

## 2023-10-15 MED ORDER — NITROFURANTOIN MONOHYD MACRO 100 MG PO CAPS: 100.0000 mg | ORAL_CAPSULE | Freq: Two times a day (BID) | ORAL | 0 refills | Status: DC

## 2023-10-15 MED ORDER — OZEMPIC (1 MG/DOSE) 4 MG/3ML ~~LOC~~ SOPN: 1.0000 mg | PEN_INJECTOR | SUBCUTANEOUS | 1 refills | Status: DC

## 2023-10-15 NOTE — Progress Notes (Signed)
 Subjective:    Patient ID: Diana [name], female    DOB: Sep 09, 1960, 63 y.o.   MRN: [id number]  HPI  Discussed the use of AI scribe software for clinical note transcription with the patient, who gave verbal consent to proceed.  Diana [name] is a 63 year old female with recurrent bacterial vaginosis who presents with burning during urination and urinary urgency and frequency.  She experiences burning during urination, describing it as a sensation of 'fire' when urine passes over a specific area. This symptom is associated with her previous episodes of bacterial vaginosis, confirmed in March, May, and December of 2024. No vaginal discharge or odor is present at this time.  She also has increased urinary urgency and frequency, which have been particularly severe over the past few days. She has a history of interstitial cystitis. No pain, fever, chills, nausea, or vomiting are reported.   Regarding weight management, she has been on ozempic  for four months, initially starting on mounjaro . She reports a total weight loss of nine pounds but notes no weight loss in the past two months. She is currently on a dose of 0.5 mg. Her mother suggested her diet might be affecting her weight loss, but she believes she is eating less than before.  Her last A1c was 6.2%, 08/2023.      Review of Systems     Past Medical History:  Diagnosis Date   Allergy    Ear infection    recurrent   Hypertension     Current Outpatient Medications  Medication Sig Dispense Refill   Ascorbic Acid 125 MG CHEW Chew 1 each by mouth daily.     atorvastatin  (LIPITOR) 10 MG tablet Take 1 tablet (10 mg total) by mouth daily. 90 tablet 1   cetirizine  (ZYRTEC ) 10 MG tablet Take 10 mg by mouth daily as needed.     Cholecalciferol (VITAMIN D3) 25 MCG (1000 UT) CAPS Take 5,000 Units by mouth.      clonazePAM  (KLONOPIN ) 0.5 MG tablet Take 1 tablet (0.5 mg total) by mouth daily as needed. for anxiety 30 tablet 0   gabapentin   (NEURONTIN ) 300 MG capsule TAKE 1 CAPSULE (300 MG TOTAL) BY MOUTH AT BEDTIME. 30 capsule 2   hydrocortisone  (ANUSOL -HC) 2.5 % rectal cream Place 1 Application rectally 2 (two) times daily. 30 g 0   ibuprofen  (ADVIL ) 200 MG tablet Take 200 mg by mouth every 6 (six) hours as needed.     losartan -hydrochlorothiazide  (HYZAAR) 50-12.5 MG tablet Take 1 tablet by mouth daily. 90 tablet 1   methocarbamol  (ROBAXIN ) 750 MG tablet Take 750 mg by mouth 4 (four) times daily.     omeprazole  (PRILOSEC) 40 MG capsule Take 1 capsule (40 mg total) by mouth 2 (two) times daily. 60 capsule 1   oxybutynin (DITROPAN-XL) 10 MG 24 hr tablet Take 10 mg by mouth daily.     OZEMPIC , 0.25 OR 0.5 MG/DOSE, 2 MG/3ML SOPN Inject 0.5 mg into the skin once a week. Start with 0.25mg  weekly x 4 weeks then increase to 0.5mg  weekly injection. 9 mL 0   potassium chloride  (MICRO-K ) 10 MEQ CR capsule Take 1 capsule (10 mEq total) by mouth daily. 90 capsule 1   No current facility-administered medications for this visit.    No Known Allergies  Family History  Problem Relation Age of Onset   Hypertension Mother        alive   Arthritis Mother    Thyroid  disease Mother  Lung cancer Father        deceased   Heart disease Father    Hypertension Maternal Grandmother     Social History   Socioeconomic History   Marital status: Widowed    Spouse name: Not on file   Number of children: 3   Years of education: Not on file   Highest education level: 12th grade  Occupational History   Occupation: customer service    Employer: [employer]  Tobacco Use   Smoking status: Former    Current packs/day: 0.00    Types: Cigarettes    Quit date: 04/20/1985    Years since quitting: 38.5    Passive exposure: Past   Smokeless tobacco: Never   Tobacco comments:    remote tobacco use   Vaping Use   Vaping status: Never Used  Substance and Sexual Activity   Alcohol use: No   Drug use: No   Sexual activity: Not Currently    Birth  control/protection: None  Other Topics Concern   Not on file  Social History Narrative   Not on file   Social Drivers of Health   Financial Resource Strain: Low Risk  (05/20/2023)   Overall Financial Resource Strain (CARDIA)    Difficulty of Paying Living Expenses: Not hard at all  Food Insecurity: Low Risk  (06/04/2023)   Received from [facility]   Hunger Vital Sign    Within the past 12 months, you worried that your food would run out before you got money to buy more: Never true    Within the past 12 months, the food you bought just didn't last and you didn't have money to get more. : Never true  Transportation Needs: No Transportation Needs (06/04/2023)   Received from [facility]    In the past 12 months, has lack of reliable transportation kept you from medical appointments, meetings, work or from getting things needed for daily living? : No  Physical Activity: Unknown (05/20/2023)   Exercise Vital Sign    Days of Exercise per Week: 0 days    Minutes of Exercise per Session: Not on file  Stress: Stress Concern Present (05/20/2023)   [employer] of Occupational Health - Occupational Stress Questionnaire    Feeling of Stress : To some extent  Social Connections: Moderately Isolated (05/20/2023)   Social Connection and Isolation Panel    Frequency of Communication with Friends and Family: More than three times a week    Frequency of Social Gatherings with Friends and Family: Once a week    Attends Religious Services: Never    Database administrator or Organizations: No    Attends Engineer, structural: Not on file    Marital Status: Living with partner  Intimate Partner Violence: Not on file     Constitutional: Patient reports difficulty losing weight.  Denies fever, malaise, fatigue, headache or abrupt weight changes.  Respiratory: Denies difficulty breathing, shortness of breath, cough or sputum production.   Cardiovascular: Denies chest  pain, chest tightness, palpitations or swelling in the hands or feet.  Gastrointestinal: Patient reports constipation, intermittent RUQ pain.  Denies abdominal pain, bloating, diarrhea or blood in the stool.  GU: Pt reports burning with urination, urinary urgency and frequency. Denies pain with urination, blood in urine, odor or discharge. Musculoskeletal: Patient reports chronic back pain.  Denies decrease in range of motion, difficulty with gait, or joint swelling.  Skin: Denies redness, rashes, lesions or ulcercations.  No other specific complaints in a complete review of systems (except as listed in HPI above).  Objective:   Physical Exam  BP 128/72 (BP Location: Left Arm, Patient Position: Sitting, Cuff Size: Large)   Ht 5' 7 (1.702 m)   Wt 230 lb 12.8 oz (104.7 kg)   LMP 01/20/2016   BMI 36.15 kg/m     Wt Readings from Last 3 Encounters:  09/06/23 227 lb (103 kg)  07/28/23 229 lb 9.6 oz (104.1 kg)  06/30/23 232 lb 4 oz (105.3 kg)    General: Appears her stated age, obese, in NAD. Skin: Warm, dry and intact. No rashes noted. Cardiovascular: Normal rate and rhythm.  Pulmonary/Chest: Normal effort and positive vesicular breath sounds. No respiratory distress. No wheezes, rales or ronchi noted.  Abdomen: Soft and nontender.  No CVA tenderness noted. Pelvic: Self swab. Musculoskeletal: No difficulty with gait.  Neurological: Alert and oriented.    BMET    Component Value Date/Time   NA 139 09/06/2023 1133   K 3.4 (L) 09/06/2023 1133   CL 96 (L) 09/06/2023 1133   CO2 31 09/06/2023 1133   GLUCOSE 116 (H) 09/06/2023 1133   BUN 12 09/06/2023 1133   CREATININE 0.74 09/06/2023 1133   CALCIUM  9.3 09/06/2023 1133   GFRNONAA >60 07/02/2021 2000   GFRAA >60 07/27/2017 2153    Lipid Panel     Component Value Date/Time   CHOL 147 09/06/2023 1133   CHOL 151 12/29/2022 1359   TRIG 104 09/06/2023 1133   TRIG 109 12/29/2022 1359   HDL 20 (L) 09/06/2023 1133   CHOLHDL 7.4  (H) 09/06/2023 1133   VLDL 21.0 02/09/2019 1245   LDLCALC 107 (H) 09/06/2023 1133    CBC    Component Value Date/Time   WBC 4.6 09/06/2023 1133   RBC 4.84 09/06/2023 1133   HGB 14.5 09/06/2023 1133   HCT 44.4 09/06/2023 1133   PLT 229 09/06/2023 1133   MCV 91.7 09/06/2023 1133   MCH 30.0 09/06/2023 1133   MCHC 32.7 09/06/2023 1133   RDW 13.2 09/06/2023 1133   LYMPHSABS 1.1 07/28/2016 1232   MONOABS 0.4 07/28/2016 1232   EOSABS 0.0 07/28/2016 1232   BASOSABS 0.0 07/28/2016 1232    Hgb A1C Lab Results  Component Value Date   HGBA1C 6.2 (H) 09/06/2023           Assessment & Plan:   Assessment and Plan    Bacterial Vaginosis (BV) Recurrent BV with dysuria, no discharge or odor. Possible pH disruption from soap and loofah use. - Switch to [facility] Sensitive soap for genital area. - Use washcloth instead of loofah. - Consider daily probiotic to neutralize pH. - Consider boric acid suppositories for one month. - Await swab results for BV and yeast; treat based on results.  Urinary urgency and frequency, Presumed UTI with urgency, frequency.  Urinalysis shows trace blood.  Preemptive antibiotics due to severity and recurrence. - Prescribe Macrobid  100 mg twice daily for 5 days. - Await urine culture results for confirmation.   Interstitial Cystitis Chronic pelvic pain with symptom overlap with urinary issues. - Not medicated at this time but could consider ditropan or alternative if symptoms persist or worsen  Class II obesity, DM 2 Minimal weight loss on Ozempic  0.5 mg over four months. Discussed dose increase and dietary changes. - Increase Ozempic  to 1 mg weekly. - Advise dietary changes: lean meats, green vegetables, reduce carbohydrates.    RTC in 5 months, follow-up chronic conditions  Angeline Laura, NP

## 2023-10-15 NOTE — Patient Instructions (Signed)
 Vaginal Infection (Bacterial Vaginosis): What to Know  Bacterial vaginosis is an infection of the vagina. It happens when the balance of normal germs (bacteria) in the vagina changes. If you don't get treated, it can make it easier for you to get other infections from sex. These are called sexually transmitted infections (STIs). If you're pregnant, you need to get treated right away. This infection can cause a baby to be born early or at a low birth weight. What are the causes? This infection happens when too many harmful germs grow in the vagina. You can't get this infection from toilet seats, bedsheets, swimming pools, or things that touch your vagina. What increases the risk? Having sex with a new person or more than one person. Having sex without protection. Douching. Having an intrauterine device (IUD). Smoking. Using drugs or drinking alcohol. These can lead you to do risky things. Taking certain antibiotics. Being pregnant. What are the signs or symptoms? Some females have no symptoms. Symptoms may include: A gray or white discharge from your vagina. It can be watery or foamy. A fishy smell. This can happen after sex or during your menstrual period. Itching in and around your vagina. Burning or pain when you pee. How is this treated? This infection is treated with antibiotics. These may be given to you as: A pill. A cream for your vagina. A medicine that you put into your vagina (suppository). If the infection comes back, you may need more antibiotics. Follow these instructions at home: Medicines Take your medicines as told. Take or use your antibiotics as told. Do not stop using them even if you start to feel better. General instructions If the person you have sex with is a female, tell her that you have this infection. She will need to follow up with her doctor. Female partners don't need to be treated. Do not have sex until you finish treatment. Drink more fluids as  told. Keep your vagina and butt clean. Wash these areas with warm water each day. Wipe from front to back after you poop. If you're breastfeeding a baby, talk to your doctor if you should keep doing so during treatment. How is this prevented? Self-care Do not douche. Do not use deodorant sprays on your vagina. Wear cotton underwear. Do not wear tight pants and pantyhose, especially in the summer. Safe sex Use condoms the correct way and every time you have sex. Use dental dams to protect yourself during oral sex. Limit how many people you have sex with. Get tested for STIs. The person you have sex with should also get tested. Drugs and alcohol Do not smoke, vape, or use nicotine or tobacco. Do not use drugs. Limit the amount of alcohol you drink because it can lead you to do risky things. Where to find more information To learn more: Go to TonerPromos.no. Click Health Topics A-Z. Type "bacterial vaginosis" in the search bar. American Sexual Health Association (ASHA): ashasexualhealth.org U.S. Department of Health and [employer], Office on Women's Health: TravelLesson.ca Contact a doctor if: Your symptoms don't get better, even after treatment. You have more discharge or pain when you pee. You have a fever or chills. You have pain in your belly or in the area between your hips. You have pain during sex. You bleed from your vagina between menstrual periods. This information is not intended to replace advice given to you by your health care provider. Make sure you discuss any questions you have with your health care provider. Document  Revised: 09/23/2022 Document Reviewed: 09/23/2022 Elsevier Patient Education  2024 [employer].

## 2023-10-16 LAB — URINE CULTURE
MICRO NUMBER:: 16635419
SPECIMEN QUALITY:: ADEQUATE

## 2023-10-18 ENCOUNTER — Ambulatory Visit: Payer: Self-pay | Admitting: Internal Medicine

## 2023-10-18 LAB — CERVICOVAGINAL ANCILLARY ONLY
Bacterial Vaginitis (gardnerella): POSITIVE — AB
Candida Glabrata: NEGATIVE
Candida Vaginitis: NEGATIVE
Comment: NEGATIVE
Comment: NEGATIVE
Comment: NEGATIVE

## 2023-10-18 MED ORDER — METRONIDAZOLE 500 MG PO TABS: 500.0000 mg | ORAL_TABLET | Freq: Two times a day (BID) | ORAL | 0 refills | Status: DC

## 2023-11-08 ENCOUNTER — Encounter: Payer: Self-pay | Admitting: Internal Medicine

## 2023-11-08 MED ORDER — OZEMPIC (0.25 OR 0.5 MG/DOSE) 2 MG/3ML ~~LOC~~ SOPN: 0.5000 mg | PEN_INJECTOR | SUBCUTANEOUS | 1 refills | Status: DC

## 2023-11-12 NOTE — Progress Notes (Deleted)
 Referring Physician:  No referring provider defined for this encounter.  Primary Physician:  Antonette Angeline ORN, NP  History of Present Illness: Diana [name] has a history of HTN, GERD, chronic hep B, hyperlipidemia, DM, and prediabetes.   History of chronic constant LBP with right leg pain with flare ups. She has known lumbar spondylosis with disc bulging at L4-S1. Mild central stenosis L4-L5.   Last seen by me on 07/28/23- she was in flare up with LBP and bilateral leg pain. She was sent to PT at [facility] and she is here for follow up.   Initial eval with PT on 08/12/23 and she did 5 more visits and her last visit was on 09/07/23.        She has more constant LBP in last few weeks. She has intermittent burning pain in her buttocks with sitting. She has intermittent right > left posterior leg pain. She still has some numbness in right lateral foot and feels some numbness in bilateral lateral legs as well. No weakness in her legs. She has difficulty getting up from laying in bed due to pain/stiffness.   She is taking neurontin  at night. She is taking prn motrin  and robaxin . Not doing HEP I gave previously.   She has been out of work since July.   She does not smoke. She quit in 1987.    Bowel/Bladder Dysfunction: none  Conservative measures:  Physical therapy: [facility]- initial eval on 08/12/23 and she did 5 more visits through 09/07/23*** Multimodal medical therapy including regular antiinflammatories: prednisone  taper, robaxin , mobic , tramadol   Injections:  Had 9-10 years ago with no relief   Past Surgery: No spinal surgery   Review of Systems:  A 10 point review of systems is negative, except for the pertinent positives and negatives detailed in the HPI.  Past Medical History: Past Medical History:  Diagnosis Date   Allergy    Ear infection    recurrent   Hypertension     Past Surgical History: Past Surgical History:  Procedure Laterality Date    ABDOMINAL HYSTERECTOMY  2019   partial   COLONOSCOPY WITH PROPOFOL  N/A 11/10/2021   Procedure: COLONOSCOPY WITH PROPOFOL ;  Surgeon: Unk Corinn Skiff, MD;  Location: [facility] ENDOSCOPY;  Service: Gastroenterology;  Laterality: N/A;   TONSILLECTOMY     TUBAL LIGATION     bilateral    Allergies: Allergies as of 11/16/2023   (No Known Allergies)    Medications: Outpatient Encounter Medications as of 11/16/2023  Medication Sig   Ascorbic Acid 125 MG CHEW Chew 1 each by mouth daily.   atorvastatin  (LIPITOR) 10 MG tablet Take 1 tablet (10 mg total) by mouth daily.   cetirizine  (ZYRTEC ) 10 MG tablet Take 10 mg by mouth daily as needed.   Cholecalciferol (VITAMIN D3) 25 MCG (1000 UT) CAPS Take 5,000 Units by mouth.    clonazePAM  (KLONOPIN ) 0.5 MG tablet Take 1 tablet (0.5 mg total) by mouth daily as needed. for anxiety   gabapentin  (NEURONTIN ) 300 MG capsule TAKE 1 CAPSULE (300 MG TOTAL) BY MOUTH AT BEDTIME.   hydrocortisone  (ANUSOL -HC) 2.5 % rectal cream Place 1 Application rectally 2 (two) times daily.   ibuprofen  (ADVIL ) 200 MG tablet Take 200 mg by mouth every 6 (six) hours as needed.   losartan -hydrochlorothiazide  (HYZAAR) 50-12.5 MG tablet Take 1 tablet by mouth daily.   methocarbamol  (ROBAXIN ) 750 MG tablet Take 750 mg by mouth 4 (four) times daily.   metroNIDAZOLE  (FLAGYL ) 500 MG tablet Take 1  tablet (500 mg total) by mouth 2 (two) times daily. Do not drink alcohol while taking this medicine.   nitrofurantoin , macrocrystal-monohydrate, (MACROBID ) 100 MG capsule Take 1 capsule (100 mg total) by mouth 2 (two) times daily.   omeprazole  (PRILOSEC) 40 MG capsule Take 1 capsule (40 mg total) by mouth 2 (two) times daily.   oxybutynin (DITROPAN-XL) 10 MG 24 hr tablet Take 10 mg by mouth daily.   OZEMPIC , 0.25 OR 0.5 MG/DOSE, 2 MG/3ML SOPN Inject 0.5 mg into the skin once a week.   potassium chloride  (MICRO-K ) 10 MEQ CR capsule Take 1 capsule (10 mEq total) by mouth daily.   No  facility-administered encounter medications on file as of 11/16/2023.    Social History: Social History   Tobacco Use   Smoking status: Former    Current packs/day: 0.00    Types: Cigarettes    Quit date: 04/20/1985    Years since quitting: 38.5    Passive exposure: Past   Smokeless tobacco: Never   Tobacco comments:    remote tobacco use   Vaping Use   Vaping status: Never Used  Substance Use Topics   Alcohol use: No   Drug use: No    Family Medical History: Family History  Problem Relation Age of Onset   Hypertension Mother        alive   Arthritis Mother    Thyroid  disease Mother    Lung cancer Father        deceased   Heart disease Father    Hypertension Maternal Grandmother     Physical Examination: There were no vitals filed for this visit.  Awake, alert, oriented to person, place, and time.  Speech is clear and fluent. Fund of knowledge is appropriate.   Cranial Nerves: Pupils equal round and reactive to light.  Facial tone is symmetric.    No abnormal lesions on exposed skin.   She has diffuse lower lumbar tenderness.   Strength:  Side Iliopsoas Quads Hamstring PF DF EHL  R 5 5 5 5 5 5   L 5 5 5 5 5 5    Reflexes are 2+ and symmetric at the patella and achilles.    Clonus is not present.   Bilateral lower extremity sensation is intact to light touch.   Gait is normal.    Medical Decision Making  Imaging: None   Assessment and Plan: Diana [name] is a pleasant 63 y.o. female with a history of chronic back and leg pain with intermittent flare ups.   She's had flare up for last 3 weeks or so with more constant LBP along with pain/burning in buttocks that is worse with sitting. She has intermittent right > left posterior leg pain. She still has some numbness in right lateral foot and feels some numbness in bilateral lateral legs as well. No weakness in her legs.    She has know lumbar spondylosis with disc bulging at L4-S1. Mild central stenosis  L4-L5.    Treatment options discussed with patient and following plan made:   - PT for lumbar spine. Orders to [facility] in [place].  - Continue current medications including prn motrin  and robaxin . Reviewed dosing and side effects. Take motrin  with food. - Continue on neurotin at night.  - She wants to hold on EMG of bilateral lower extremities for now. May revisit.  - Discussed possible injections with PMR. She declines for now.  - Follow up with me in 6-8 weeks and prn.   I spent  a total of 20 minutes in face-to-face and non-face-to-face activities related to this patient's care today including review of outside records, review of imaging, review of symptoms, physical exam, discussion of differential diagnosis, discussion of treatment options, and documentation.   Glade Boys PA-C Dept. of Neurosurgery

## 2023-11-16 ENCOUNTER — Ambulatory Visit: Admitting: Orthopedic Surgery

## 2023-11-30 NOTE — Progress Notes (Deleted)
 Referring Physician:  Antonette [name] ORN, NP [address] [place],  [place] [address]  Primary Physician:  Diana [name] ORN, NP  History of Present Illness: [name] has a history of HTN, GERD, chronic hep B, hyperlipidemia, DM, and prediabetes.   History of chronic constant LBP with right leg pain with flare ups. She has known lumbar spondylosis with disc bulging at L4-S1. Mild central stenosis L4-L5.   Last seen by me on 07/28/23- she was in flare up with LBP and bilateral leg pain. She was sent to PT at [facility] and she is here for follow up.   Initial eval with PT on 08/12/23 and she did 5 more visits and her last visit was on 09/07/23.        She has more constant LBP in last few weeks. She has intermittent burning pain in her buttocks with sitting. She has intermittent right > left posterior leg pain. She still has some numbness in right lateral foot and feels some numbness in bilateral lateral legs as well. No weakness in her legs. She has difficulty getting up from laying in bed due to pain/stiffness.   She is taking neurontin  at night. She is taking prn motrin  and robaxin . Not doing HEP I gave previously.   She has been out of work since July.   She does not smoke. She quit in 1987.    Bowel/Bladder Dysfunction: none  Conservative measures:  Physical therapy: [facility]- initial eval on 08/12/23 and she did 5 more visits through 09/07/23*** Multimodal medical therapy including regular antiinflammatories: prednisone  taper, robaxin , mobic , tramadol   Injections:  Had 9-10 years ago with no relief   Past Surgery: No spinal surgery   Review of Systems:  A 10 point review of systems is negative, except for the pertinent positives and negatives detailed in the HPI.  Past Medical History: Past Medical History:  Diagnosis Date   Allergy    Ear infection    recurrent   Hypertension     Past Surgical History: Past Surgical History:  Procedure Laterality Date    ABDOMINAL HYSTERECTOMY  2019   partial   COLONOSCOPY WITH PROPOFOL  N/A 11/10/2021   Procedure: COLONOSCOPY WITH PROPOFOL ;  Surgeon: Unk Corinn Skiff, MD;  Location: [facility] ENDOSCOPY;  Service: Gastroenterology;  Laterality: N/A;   TONSILLECTOMY     TUBAL LIGATION     bilateral    Allergies: Allergies as of 12/08/2023   (No Known Allergies)    Medications: Outpatient Encounter Medications as of 12/08/2023  Medication Sig   Ascorbic Acid 125 MG CHEW Chew 1 each by mouth daily.   atorvastatin  (LIPITOR) 10 MG tablet Take 1 tablet (10 mg total) by mouth daily.   cetirizine  (ZYRTEC ) 10 MG tablet Take 10 mg by mouth daily as needed.   Cholecalciferol (VITAMIN D3) 25 MCG (1000 UT) CAPS Take 5,000 Units by mouth.    clonazePAM  (KLONOPIN ) 0.5 MG tablet Take 1 tablet (0.5 mg total) by mouth daily as needed. for anxiety   gabapentin  (NEURONTIN ) 300 MG capsule TAKE 1 CAPSULE (300 MG TOTAL) BY MOUTH AT BEDTIME.   hydrocortisone  (ANUSOL -HC) 2.5 % rectal cream Place 1 Application rectally 2 (two) times daily.   ibuprofen  (ADVIL ) 200 MG tablet Take 200 mg by mouth every 6 (six) hours as needed.   losartan -hydrochlorothiazide  (HYZAAR) 50-12.5 MG tablet Take 1 tablet by mouth daily.   methocarbamol  (ROBAXIN ) 750 MG tablet Take 750 mg by mouth 4 (four) times daily.   metroNIDAZOLE  (FLAGYL )  500 MG tablet Take 1 tablet (500 mg total) by mouth 2 (two) times daily. Do not drink alcohol while taking this medicine.   nitrofurantoin , macrocrystal-monohydrate, (MACROBID ) 100 MG capsule Take 1 capsule (100 mg total) by mouth 2 (two) times daily.   omeprazole  (PRILOSEC) 40 MG capsule Take 1 capsule (40 mg total) by mouth 2 (two) times daily.   oxybutynin (DITROPAN-XL) 10 MG 24 hr tablet Take 10 mg by mouth daily.   OZEMPIC , 0.25 OR 0.5 MG/DOSE, 2 MG/3ML SOPN Inject 0.5 mg into the skin once a week.   potassium chloride  (MICRO-K ) 10 MEQ CR capsule Take 1 capsule (10 mEq total) by mouth daily.   No  facility-administered encounter medications on file as of 12/08/2023.    Social History: Social History   Tobacco Use   Smoking status: Former    Current packs/day: 0.00    Types: Cigarettes    Quit date: 04/20/1985    Years since quitting: 38.6    Passive exposure: Past   Smokeless tobacco: Never   Tobacco comments:    remote tobacco use   Vaping Use   Vaping status: Never Used  Substance Use Topics   Alcohol use: No   Drug use: No    Family Medical History: Family History  Problem Relation Age of Onset   Hypertension Mother        alive   Arthritis Mother    Thyroid  disease Mother    Lung cancer Father        deceased   Heart disease Father    Hypertension Maternal Grandmother     Physical Examination: There were no vitals filed for this visit.  Awake, alert, oriented to person, place, and time.  Speech is clear and fluent. Fund of knowledge is appropriate.   Cranial Nerves: Pupils equal round and reactive to light.  Facial tone is symmetric.    No abnormal lesions on exposed skin.   She has diffuse lower lumbar tenderness.   Strength:  Side Iliopsoas Quads Hamstring PF DF EHL  R 5 5 5 5 5 5   L 5 5 5 5 5 5    Reflexes are 2+ and symmetric at the patella and achilles.    Clonus is not present.   Bilateral lower extremity sensation is intact to light touch.   Gait is normal.    Medical Decision Making  Imaging: None   Assessment and Plan: Diana [name] is a pleasant 63 y.o. female with a history of chronic back and leg pain with intermittent flare ups.   She's had flare up for last 3 weeks or so with more constant LBP along with pain/burning in buttocks that is worse with sitting. She has intermittent right > left posterior leg pain. She still has some numbness in right lateral foot and feels some numbness in bilateral lateral legs as well. No weakness in her legs.    She has know lumbar spondylosis with disc bulging at L4-S1. Mild central stenosis  L4-L5.    Treatment options discussed with patient and following plan made:   - PT for lumbar spine. Orders to [facility] in [place].  - Continue current medications including prn motrin  and robaxin . Reviewed dosing and side effects. Take motrin  with food. - Continue on neurotin at night.  - She wants to hold on EMG of bilateral lower extremities for now. May revisit.  - Discussed possible injections with PMR. She declines for now.  - Follow up with me in 6-8 weeks and  prn.   I spent a total of 20 minutes in face-to-face and non-face-to-face activities related to this patient's care today including review of outside records, review of imaging, review of symptoms, physical exam, discussion of differential diagnosis, discussion of treatment options, and documentation.   Glade Boys PA-C Dept. of Neurosurgery

## 2023-12-02 ENCOUNTER — Other Ambulatory Visit: Payer: Self-pay | Admitting: Internal Medicine

## 2023-12-02 ENCOUNTER — Other Ambulatory Visit: Payer: Self-pay | Admitting: Nurse Practitioner

## 2023-12-02 DIAGNOSIS — K219 Gastro-esophageal reflux disease without esophagitis: Secondary | ICD-10-CM

## 2023-12-02 DIAGNOSIS — N289 Disorder of kidney and ureter, unspecified: Secondary | ICD-10-CM

## 2023-12-02 NOTE — Telephone Encounter (Unsigned)
 Copied from CRM [id number]. Topic: Clinical - Medication Refill >> Dec 02, 2023 12:44 PM Dedra B wrote: Medication: clonazePAM  (KLONOPIN ) 0.5 MG tablet methocarbamol  (ROBAXIN ) 750 MG tablet   Has the patient contacted their pharmacy? No  (Agent: If no, request that the patient contact the pharmacy for the refill. If patient does not wish to contact the pharmacy document the reason why and proceed with request.)  tried refilling on MyChart and couldn't   This is the patient's preferred pharmacy:  [facility] - [place], [place] - [address] [place] [address] Phone: [phone] Fax: [phone]  Is this the correct pharmacy for this prescription? Yes If no, delete pharmacy and type the correct one.   Has the prescription been filled recently? No  Is the patient out of the medication? Yes, took last Methocarbamol  today  Has the patient been seen for an appointment in the last year OR does the patient have an upcoming appointment? Yes  Can we respond through MyChart? Yes  Agent: Please be advised that Rx refills may take up to 3 business days. We ask that you follow-up with your pharmacy.

## 2023-12-06 NOTE — Telephone Encounter (Signed)
 Requested medication (s) are due for refill today: yes  Requested medication (s) are on the active medication list: yes  Last refill:  07/15/23  Future visit scheduled: {Yes  Notes to clinic:  Unable to refill per protocol, cannot delegate.      Requested Prescriptions  Pending Prescriptions Disp Refills   clonazePAM  (KLONOPIN ) 0.5 MG tablet 30 tablet 0    Sig: Take 1 tablet (0.5 mg total) by mouth daily as needed. for anxiety     Not Delegated - Psychiatry: Anxiolytics/Hypnotics 2 Failed - 12/06/2023  3:03 PM      Failed - This refill cannot be delegated      Failed - Urine Drug Screen completed in last 360 days      Passed - Patient is not pregnant      Passed - Valid encounter within last 6 months    Recent Outpatient Visits           1 month ago Urinary urgency   [place] [facility] [place], Angeline ORN, NP   3 months ago Encounter for general adult medical examination with abnormal findings   [place] [facility] [place], Angeline ORN, NP   4 months ago Abdominal bloating   [place] [facility] [place], [place] W, NP   5 months ago NAFLD (nonalcoholic fatty liver disease)   [place] [facility] [place], [place], NP   6 months ago Type 2 diabetes mellitus without complication, without long-term current use of insulin [facility])   [facility] [place], [place], NP               methocarbamol  (ROBAXIN ) 750 MG tablet      Sig: Take 1 tablet (750 mg total) by mouth 4 (four) times daily.     Not Delegated - Analgesics:  Muscle Relaxants Failed - 12/06/2023  3:03 PM      Failed - This refill cannot be delegated      Passed - Valid encounter within last 6 months    Recent Outpatient Visits           1 month ago Urinary urgency   [place] [facility] [place], Angeline ORN, NP   3 months ago Encounter for general adult medical examination with abnormal findings   [facility] [place], Angeline ORN, NP   4 months ago Abdominal bloating   [place] [place], [place], NP   5 months ago NAFLD (nonalcoholic fatty liver disease)   [place] [facility] [place], Angeline ORN, NP   6 months ago Type 2 diabetes mellitus without complication, without long-term current use of insulin [facility])   [place] [facility] [place], Angeline ORN, [place]

## 2023-12-07 MED ORDER — CLONAZEPAM 0.5 MG PO TABS: 0.5000 mg | ORAL_TABLET | Freq: Every day | ORAL | 0 refills | Status: AC | PRN

## 2023-12-07 MED ORDER — METHOCARBAMOL 750 MG PO TABS: 750.0000 mg | ORAL_TABLET | Freq: Three times a day (TID) | ORAL | 0 refills | Status: DC | PRN

## 2023-12-08 ENCOUNTER — Ambulatory Visit: Admitting: Orthopedic Surgery

## 2023-12-14 ENCOUNTER — Ambulatory Visit
Admission: RE | Admit: 2023-12-14 | Discharge: 2023-12-14 | Disposition: A | Discharge status: Home / Self Care | Source: Ambulatory Visit | Attending: Nurse Practitioner

## 2023-12-14 DIAGNOSIS — N289 Disorder of kidney and ureter, unspecified: Secondary | ICD-10-CM

## 2023-12-15 ENCOUNTER — Encounter: Payer: Self-pay | Admitting: Internal Medicine

## 2023-12-15 ENCOUNTER — Other Ambulatory Visit (HOSPITAL_COMMUNITY)
Admission: RE | Admit: 2023-12-15 | Discharge: 2023-12-15 | Disposition: A | Discharge status: Home / Self Care | Source: Ambulatory Visit | Attending: Internal Medicine | Admitting: Internal Medicine

## 2023-12-15 ENCOUNTER — Ambulatory Visit: Admitting: Internal Medicine

## 2023-12-15 VITALS — BP 128/74 | Ht 67.0 in | Wt 226.0 lb

## 2023-12-15 DIAGNOSIS — N898 Other specified noninflammatory disorders of vagina: Secondary | ICD-10-CM

## 2023-12-15 DIAGNOSIS — R300 Dysuria: Secondary | ICD-10-CM | POA: Diagnosis not present

## 2023-12-15 DIAGNOSIS — N281 Cyst of kidney, acquired: Secondary | ICD-10-CM

## 2023-12-15 MED ORDER — METRONIDAZOLE 500 MG PO TABS: 500.0000 mg | ORAL_TABLET | Freq: Two times a day (BID) | ORAL | 0 refills | Status: DC

## 2023-12-15 NOTE — Progress Notes (Signed)
 Subjective:    Patient ID: Diana [name], female    DOB: [birth year], 63 y.o.   MRN: [id number]  HPI  Discussed the use of AI scribe software for clinical note transcription with the patient, who gave verbal consent to proceed.  Diana [name] is a 63 year old female who presents with recurrent vaginal irritation and burning with urination.  She experiences a burning sensation during urination localized to the left side, persisting for the past two to three days. No increased frequency of urination, hematuria, vaginal bleeding, discharge, odor, fever, or chills.  She has a history of recurrent bacterial vaginosis, with the last episode occurring two months ago. She prefers oral medication for treatment. She has switched to sensitive soap and wipes but uses wipes internally. She recently went to the beach and suspects that being in the pool may have triggered her current symptoms.  She is also due to follow-up for cyst in her right kidney.  Her liver specialist ordered a renal ultrasound yesterday which was performed but has not been read by radiology.       Review of Systems     Past Medical History:  Diagnosis Date   Allergy    Ear infection    recurrent   Hypertension     Current Outpatient Medications  Medication Sig Dispense Refill   Ascorbic Acid 125 MG CHEW Chew 1 each by mouth daily.     atorvastatin  (LIPITOR) 10 MG tablet Take 1 tablet (10 mg total) by mouth daily. 90 tablet 1   cetirizine  (ZYRTEC ) 10 MG tablet Take 10 mg by mouth daily as needed.     Cholecalciferol (VITAMIN D3) 25 MCG (1000 UT) CAPS Take 5,000 Units by mouth.      clonazePAM  (KLONOPIN ) 0.5 MG tablet Take 1 tablet (0.5 mg total) by mouth daily as needed. for anxiety 30 tablet 0   gabapentin  (NEURONTIN ) 300 MG capsule TAKE 1 CAPSULE (300 MG TOTAL) BY MOUTH AT BEDTIME. 30 capsule 2   hydrocortisone  (ANUSOL -HC) 2.5 % rectal cream Place 1 Application rectally 2 (two) times daily. 30 g 0   ibuprofen   (ADVIL ) 200 MG tablet Take 200 mg by mouth every 6 (six) hours as needed.     losartan -hydrochlorothiazide  (HYZAAR) 50-12.5 MG tablet Take 1 tablet by mouth daily. 90 tablet 1   methocarbamol  (ROBAXIN ) 750 MG tablet Take 1 tablet (750 mg total) by mouth every 8 (eight) hours as needed for muscle spasms. 30 tablet 0   metroNIDAZOLE  (FLAGYL ) 500 MG tablet Take 1 tablet (500 mg total) by mouth 2 (two) times daily. Do not drink alcohol while taking this medicine. 14 tablet 0   nitrofurantoin , macrocrystal-monohydrate, (MACROBID ) 100 MG capsule Take 1 capsule (100 mg total) by mouth 2 (two) times daily. 10 capsule 0   omeprazole  (PRILOSEC) 40 MG capsule Take 1 capsule (40 mg total) by mouth 2 (two) times daily. 60 capsule 1   oxybutynin (DITROPAN-XL) 10 MG 24 hr tablet Take 10 mg by mouth daily.     OZEMPIC , 0.25 OR 0.5 MG/DOSE, 2 MG/3ML SOPN Inject 0.5 mg into the skin once a week. 9 mL 1   potassium chloride  (MICRO-K ) 10 MEQ CR capsule Take 1 capsule (10 mEq total) by mouth daily. 90 capsule 1   No current facility-administered medications for this visit.    No Known Allergies  Family History  Problem Relation Age of Onset   Hypertension Mother        alive   Arthritis  Mother    Thyroid  disease Mother    Lung cancer Father        deceased   Heart disease Father    Hypertension Maternal Grandmother     Social History   Socioeconomic History   Marital status: Widowed    Spouse name: Not on file   Number of children: 3   Years of education: Not on file   Highest education level: 12th grade  Occupational History   Occupation: customer service    Employer: [employer]  Tobacco Use   Smoking status: Former    Current packs/day: 0.00    Types: Cigarettes    Quit date: 04/20/1985    Years since quitting: 38.6    Passive exposure: Past   Smokeless tobacco: Never   Tobacco comments:    remote tobacco use   Vaping Use   Vaping status: Never Used  Substance and Sexual Activity    Alcohol use: No   Drug use: No   Sexual activity: Not Currently    Birth control/protection: None  Other Topics Concern   Not on file  Social History Narrative   Not on file   Social Drivers of Health   Financial Resource Strain: Low Risk  (10/15/2023)   Overall Financial Resource Strain (CARDIA)    Difficulty of Paying Living Expenses: Not very hard  Food Insecurity: No Food Insecurity (10/15/2023)   Hunger Vital Sign    Worried About Running Out of Food in the Last Year: Never true    Ran Out of Food in the Last Year: Never true  Transportation Needs: No Transportation Needs (10/15/2023)   PRAPARE - Administrator, Civil Service (Medical): No    Lack of Transportation (Non-Medical): No  Physical Activity: Unknown (10/15/2023)   Exercise Vital Sign    Days of Exercise per Week: Patient declined    Minutes of Exercise per Session: Not on file  Stress: No Stress Concern Present (10/15/2023)   [employer] of Occupational Health - Occupational Stress Questionnaire    Feeling of Stress: Not at all  Social Connections: Moderately Isolated (10/15/2023)   Social Connection and Isolation Panel    Frequency of Communication with Friends and Family: More than three times a week    Frequency of Social Gatherings with Friends and Family: Once a week    Attends Religious Services: Never    Database administrator or Organizations: No    Attends Engineer, structural: Not on file    Marital Status: Living with partner  Intimate Partner Violence: Not on file     Constitutional: Patient reports difficulty losing weight.  Denies fever, malaise, fatigue, headache or abrupt weight changes.  Respiratory: Denies difficulty breathing, shortness of breath, cough or sputum production.   Cardiovascular: Denies chest pain, chest tightness, palpitations or swelling in the hands or feet.  Gastrointestinal: Patient reports constipation, intermittent RUQ pain.  Denies abdominal pain,  bloating, diarrhea or blood in the stool.  GU: Pt reports vaginal irritation, burning with urination. Denies pain with urination, blood in urine, odor or discharge. Musculoskeletal: Patient reports chronic back pain.  Denies decrease in range of motion, difficulty with gait, or joint swelling.  Skin: Denies redness, rashes, lesions or ulcercations.   No other specific complaints in a complete review of systems (except as listed in HPI above).  Objective:   Physical Exam  BP 128/74 (BP Location: Left Arm, Patient Position: Sitting, Cuff Size: Large)   Ht 5'  7 (1.702 m)   Wt 226 lb (102.5 kg)   LMP 01/20/2016   BMI 35.40 kg/m     Wt Readings from Last 3 Encounters:  10/15/23 230 lb 12.8 oz (104.7 kg)  09/06/23 227 lb (103 kg)  07/28/23 229 lb 9.6 oz (104.1 kg)    General: Appears her stated age, obese, in NAD. Cardiovascular: Normal rate and rhythm.  Pulmonary/Chest: Normal effort and positive vesicular breath sounds. No respiratory distress. No wheezes, rales or ronchi noted.  Abdomen: Soft and nontender.  No CVA tenderness noted. Pelvic: Self swab. Musculoskeletal: No difficulty with gait.  Neurological: Alert and oriented.    BMET    Component Value Date/Time   NA 139 09/06/2023 1133   K 3.4 (L) 09/06/2023 1133   CL 96 (L) 09/06/2023 1133   CO2 31 09/06/2023 1133   GLUCOSE 116 (H) 09/06/2023 1133   BUN 12 09/06/2023 1133   CREATININE 0.74 09/06/2023 1133   CALCIUM  9.3 09/06/2023 1133   GFRNONAA >60 07/02/2021 2000   GFRAA >60 07/27/2017 2153    Lipid Panel     Component Value Date/Time   CHOL 147 09/06/2023 1133   CHOL 151 12/29/2022 1359   TRIG 104 09/06/2023 1133   TRIG 109 12/29/2022 1359   HDL 20 (L) 09/06/2023 1133   CHOLHDL 7.4 (H) 09/06/2023 1133   VLDL 21.0 02/09/2019 1245   LDLCALC 107 (H) 09/06/2023 1133    CBC    Component Value Date/Time   WBC 4.6 09/06/2023 1133   RBC 4.84 09/06/2023 1133   HGB 14.5 09/06/2023 1133   HCT 44.4  09/06/2023 1133   PLT 229 09/06/2023 1133   MCV 91.7 09/06/2023 1133   MCH 30.0 09/06/2023 1133   MCHC 32.7 09/06/2023 1133   RDW 13.2 09/06/2023 1133   LYMPHSABS 1.1 07/28/2016 1232   MONOABS 0.4 07/28/2016 1232   EOSABS 0.0 07/28/2016 1232   BASOSABS 0.0 07/28/2016 1232    Hgb A1C Lab Results  Component Value Date   HGBA1C 6.2 (H) 09/06/2023           Assessment & Plan:    Assessment and Plan    Recurrent bacterial vaginosis Recurrent bacterial vaginosis with burning urination, no systemic symptoms. Prefers oral treatment. Internal use of soap and wipes may disrupt flora. - Prescribe metronidazole  500 mg orally twice a day for 7 days. - Perform wet prep for bacterial vaginosis and yeast. - Advise against internal use of soap and wipes. - Discuss boric acid suppositories daily for a month as prevention.  Renal cyst under surveillance Renal cyst under surveillance. Awaiting recent ultrasound results for evaluation. - Review ultrasound results once available. - Follow up on ultrasound findings.    RTC in 3 months, follow-up chronic conditions Angeline Laura, NP

## 2023-12-15 NOTE — Patient Instructions (Signed)
 Vaginal Infection (Bacterial Vaginosis): What to Know  Bacterial vaginosis is an infection of the vagina. It happens when the balance of normal germs (bacteria) in the vagina changes. If you don't get treated, it can make it easier for you to get other infections from sex. These are called sexually transmitted infections (STIs). If you're pregnant, you need to get treated right away. This infection can cause a baby to be born early or at a low birth weight. What are the causes? This infection happens when too many harmful germs grow in the vagina. You can't get this infection from toilet seats, bedsheets, swimming pools, or things that touch your vagina. What increases the risk? Having sex with a new person or more than one person. Having sex without protection. Douching. Having an intrauterine device (IUD). Smoking. Using drugs or drinking alcohol. These can lead you to do risky things. Taking certain antibiotics. Being pregnant. What are the signs or symptoms? Some females have no symptoms. Symptoms may include: A gray or white discharge from your vagina. It can be watery or foamy. A fishy smell. This can happen after sex or during your menstrual period. Itching in and around your vagina. Burning or pain when you pee. How is this treated? This infection is treated with antibiotics. These may be given to you as: A pill. A cream for your vagina. A medicine that you put into your vagina (suppository). If the infection comes back, you may need more antibiotics. Follow these instructions at home: Medicines Take your medicines as told. Take or use your antibiotics as told. Do not stop using them even if you start to feel better. General instructions If the person you have sex with is a female, tell her that you have this infection. She will need to follow up with her doctor. Female partners don't need to be treated. Do not have sex until you finish treatment. Drink more fluids as  told. Keep your vagina and butt clean. Wash these areas with warm water each day. Wipe from front to back after you poop. If you're breastfeeding a baby, talk to your doctor if you should keep doing so during treatment. How is this prevented? Self-care Do not douche. Do not use deodorant sprays on your vagina. Wear cotton underwear. Do not wear tight pants and pantyhose, especially in the summer. Safe sex Use condoms the correct way and every time you have sex. Use dental dams to protect yourself during oral sex. Limit how many people you have sex with. Get tested for STIs. The person you have sex with should also get tested. Drugs and alcohol Do not smoke, vape, or use nicotine or tobacco. Do not use drugs. Limit the amount of alcohol you drink because it can lead you to do risky things. Where to find more information To learn more: Go to TonerPromos.no. Click Health Topics A-Z. Type bacterial vaginosis in the search bar. American Sexual Health Association (ASHA): ashasexualhealth.org U.S. Department of Health and [employer], Office on Women's Health: TravelLesson.ca Contact a doctor if: Your symptoms don't get better, even after treatment. You have more discharge or pain when you pee. You have a fever or chills. You have pain in your belly or in the area between your hips. You have pain during sex. You bleed from your vagina between menstrual periods. This information is not intended to replace advice given to you by your health care provider. Make sure you discuss any questions you have with your health care provider. Document  Revised: 09/23/2022 Document Reviewed: 09/23/2022 Elsevier Patient Education  2024 [employer].

## 2023-12-16 ENCOUNTER — Ambulatory Visit: Payer: Self-pay | Admitting: Internal Medicine

## 2023-12-16 LAB — CERVICOVAGINAL ANCILLARY ONLY
Bacterial Vaginitis (gardnerella): POSITIVE — AB
Candida Glabrata: NEGATIVE
Candida Vaginitis: NEGATIVE
Comment: NEGATIVE
Comment: NEGATIVE
Comment: NEGATIVE

## 2023-12-17 NOTE — Progress Notes (Signed)
 Referring Physician:  Antonette Angeline ORN, NP [address] [place],  [place] [address]  Primary Physician:  Antonette Angeline ORN, NP  History of Present Illness: [name] has a history of HTN, GERD, chronic hep B, hyperlipidemia, DM, and prediabetes.   History of chronic constant LBP with right leg pain with flare ups. She has known lumbar spondylosis with disc bulging at L4-S1. Mild central stenosis L4-L5.   Last seen by me on 07/28/23- she was in flare up with LBP and bilateral leg pain. She was sent to PT at [facility] and she is here for follow up.   Initial eval with PT on 08/12/23 and she did 5 more visits and her last visit was on 09/07/23.   She did not see much relief with PT. She continues with constant LBP with intermittent burning pain in her buttocks with sitting. She has intermittent right > left posterior leg pain. She can only shop/walk for about a 90 minutes before she has to stop due to LBP/leg pain. She has numbness in her legs. No weakness. Her pain is worse in the morning and better as she moves around.   She is taking prn motrin  and robaxin . She stopped neurontin  as it was not helping.   She is retired.   She does not smoke. She quit in 1987.    Bowel/Bladder Dysfunction: none  Conservative measures:  Physical therapy: [facility]- initial eval on 08/12/23 and she did 5 more visits through 09/07/23 Multimodal medical therapy including regular antiinflammatories: prednisone  taper, robaxin , mobic , tramadol   Injections:  Had 9-10 years ago with no relief   Past Surgery: No spinal surgery   Review of Systems:  A 10 point review of systems is negative, except for the pertinent positives and negatives detailed in the HPI.  Past Medical History: Past Medical History:  Diagnosis Date   Allergy    Ear infection    recurrent   Hypertension     Past Surgical History: Past Surgical History:  Procedure Laterality Date   ABDOMINAL HYSTERECTOMY  2019   partial    COLONOSCOPY WITH PROPOFOL  N/A 11/10/2021   Procedure: COLONOSCOPY WITH PROPOFOL ;  Surgeon: Unk Corinn Skiff, MD;  Location: [facility] ENDOSCOPY;  Service: Gastroenterology;  Laterality: N/A;   TONSILLECTOMY     TUBAL LIGATION     bilateral    Allergies: Allergies as of 12/21/2023   (No Known Allergies)    Medications: Outpatient Encounter Medications as of 12/21/2023  Medication Sig   Ascorbic Acid 125 MG CHEW Chew 1 each by mouth daily.   atorvastatin  (LIPITOR) 10 MG tablet Take 1 tablet (10 mg total) by mouth daily.   cetirizine  (ZYRTEC ) 10 MG tablet Take 10 mg by mouth daily as needed.   Cholecalciferol (VITAMIN D3) 25 MCG (1000 UT) CAPS Take 5,000 Units by mouth.    clonazePAM  (KLONOPIN ) 0.5 MG tablet Take 1 tablet (0.5 mg total) by mouth daily as needed. for anxiety   hydrocortisone  (ANUSOL -HC) 2.5 % rectal cream Place 1 Application rectally 2 (two) times daily.   ibuprofen  (ADVIL ) 200 MG tablet Take 200 mg by mouth every 6 (six) hours as needed.   losartan -hydrochlorothiazide  (HYZAAR) 50-12.5 MG tablet Take 1 tablet by mouth daily.   methocarbamol  (ROBAXIN ) 750 MG tablet Take 1 tablet (750 mg total) by mouth every 8 (eight) hours as needed for muscle spasms.   metroNIDAZOLE  (FLAGYL ) 500 MG tablet Take 1 tablet (500 mg total) by mouth 2 (two) times daily. Do not  drink alcohol while taking this medicine.   omeprazole  (PRILOSEC) 40 MG capsule Take 1 capsule (40 mg total) by mouth 2 (two) times daily.   oxybutynin (DITROPAN-XL) 10 MG 24 hr tablet Take 10 mg by mouth daily.   OZEMPIC , 0.25 OR 0.5 MG/DOSE, 2 MG/3ML SOPN Inject 0.5 mg into the skin once a week.   potassium chloride  (MICRO-K ) 10 MEQ CR capsule Take 1 capsule (10 mEq total) by mouth daily.   traMADol  (ULTRAM ) 50 MG tablet Take 50 mg by mouth every 6 (six) hours as needed.   [DISCONTINUED] gabapentin  (NEURONTIN ) 300 MG capsule TAKE 1 CAPSULE (300 MG TOTAL) BY MOUTH AT BEDTIME.   No facility-administered encounter  medications on file as of 12/21/2023.    Social History: Social History   Tobacco Use   Smoking status: Former    Current packs/day: 0.00    Types: Cigarettes    Quit date: 04/20/1985    Years since quitting: 38.6    Passive exposure: Past   Smokeless tobacco: Never   Tobacco comments:    remote tobacco use   Vaping Use   Vaping status: Never Used  Substance Use Topics   Alcohol use: No   Drug use: No    Family Medical History: Family History  Problem Relation Age of Onset   Hypertension Mother        alive   Arthritis Mother    Thyroid  disease Mother    Lung cancer Father        deceased   Heart disease Father    Hypertension Maternal Grandmother     Physical Examination: Vitals:   12/21/23 0933  BP: 116/72    Awake, alert, oriented to person, place, and time.  Speech is clear and fluent. Fund of knowledge is appropriate.   Cranial Nerves: Pupils equal round and reactive to light.  Facial tone is symmetric.    No abnormal lesions on exposed skin.   She has diffuse lower lumbar tenderness.   Strength:  Side Iliopsoas Quads Hamstring PF DF EHL  R 5 5 5 5 5 5   L 5 5 5 5 5 5    Reflexes are 2+ and symmetric at the patella and achilles.    Clonus is not present.   Bilateral lower extremity sensation is intact to light touch.   Gait is normal.    Medical Decision Making  Imaging: None   Assessment and Plan: [name] has constant LBP with intermittent burning pain in her buttocks with sitting. She has intermittent right > left posterior leg pain. She can only shop/walk for about a 90 minutes before she has to stop due to LBP/leg pain. She has numbness in her legs. No weakness.    She has know lumbar spondylosis with disc bulging at L4-S1. Mild central stenosis L4-L5.    Treatment options discussed with patient and following plan made:   - PT for lumbar spine. Orders to [facility]- she will call them to schedule a visit.  - Continue current medications  including prn motrin  and robaxin . Reviewed dosing and side effects. Take motrin  with food. - Discussed possible injections with PMR. She declines for now.  - Follow up with me in 8 weeks and prn.   I spent a total of 20 minutes in face-to-face and non-face-to-face activities related to this patient's care today including review of outside records, review of imaging, review of symptoms, physical exam, discussion of differential diagnosis, discussion of treatment options, and documentation.   Glade Boys  PA-C Dept. of Neurosurgery

## 2023-12-21 ENCOUNTER — Ambulatory Visit (INDEPENDENT_AMBULATORY_CARE_PROVIDER_SITE_OTHER): Admitting: Orthopedic Surgery

## 2023-12-21 ENCOUNTER — Encounter: Payer: Self-pay | Admitting: Orthopedic Surgery

## 2023-12-21 VITALS — BP 116/72 | Ht 67.0 in | Wt 224.0 lb

## 2023-12-21 DIAGNOSIS — M47816 Spondylosis without myelopathy or radiculopathy, lumbar region: Secondary | ICD-10-CM

## 2023-12-21 DIAGNOSIS — M4726 Other spondylosis with radiculopathy, lumbar region: Secondary | ICD-10-CM

## 2023-12-21 DIAGNOSIS — M5416 Radiculopathy, lumbar region: Secondary | ICD-10-CM

## 2023-12-21 DIAGNOSIS — M48061 Spinal stenosis, lumbar region without neurogenic claudication: Secondary | ICD-10-CM | POA: Diagnosis not present

## 2023-12-21 NOTE — Patient Instructions (Signed)
 It was so nice to see you today. Thank you so much for coming in.    I sent physical therapy orders to [facility]. You need to call them at [phone] to schedule your visit.   I will see you back in 8 weeks. Please do not hesitate to call if you have any questions or concerns. You can also message me in MyChart.   Glade Boys PA-C [phone]     The physicians and staff at [facility] at [facility] are committed to providing excellent care. You may receive a survey asking for feedback about your experience at our office. We value you your feedback and appreciate you taking the time to to fill it out. The [facility] leadership team is also available to discuss your experience in person, feel free to contact us  [phone].

## 2024-01-04 ENCOUNTER — Ambulatory Visit: Admitting: Orthopedic Surgery

## 2024-01-13 ENCOUNTER — Ambulatory Visit: Admitting: Internal Medicine

## 2024-01-13 ENCOUNTER — Encounter: Payer: Self-pay | Admitting: Internal Medicine

## 2024-01-13 VITALS — BP 128/78 | Ht 67.0 in | Wt 221.8 lb

## 2024-01-13 DIAGNOSIS — K644 Residual hemorrhoidal skin tags: Secondary | ICD-10-CM

## 2024-01-13 DIAGNOSIS — K589 Irritable bowel syndrome without diarrhea: Secondary | ICD-10-CM | POA: Diagnosis not present

## 2024-01-13 DIAGNOSIS — M79601 Pain in right arm: Secondary | ICD-10-CM

## 2024-01-13 MED ORDER — HYDROCORT-PRAMOXINE (PERIANAL) 1-1 % EX FOAM: 1.0000 | Freq: Two times a day (BID) | CUTANEOUS | 0 refills | Status: DC

## 2024-01-13 MED ORDER — DICYCLOMINE HCL 10 MG PO CAPS: 10.0000 mg | ORAL_CAPSULE | Freq: Three times a day (TID) | ORAL | 0 refills | Status: DC

## 2024-01-13 NOTE — Patient Instructions (Signed)
 Hemorrhoids Hemorrhoids are swollen veins that may form: In the butt (rectum). These are called internal hemorrhoids. Around the opening of the butt (anus). These are called external hemorrhoids. Most hemorrhoids do not cause very bad problems. They often get better with changes to your lifestyle and what you eat. What are the causes? Having trouble pooping (constipation) or watery poop (diarrhea). Pushing too hard when you poop. Pregnancy. Being very overweight (obese). Sitting for too long. Riding a bike for a long time. Heavy lifting or other things that take a lot of effort. Anal sex. What are the signs or symptoms? Pain. Itching or soreness in the butt. Bleeding from the butt. Leaking poop. Swelling. One or more lumps around the opening of your butt. How is this treated? In most cases, hemorrhoids can be treated at home. You may be told to: Change what you eat. Make changes to your lifestyle. If these treatments do not help, you may need to have a procedure done. Your doctor may need to: Place rubber bands at the bottom of the hemorrhoids to make them fall off. Put medicine into the hemorrhoids to shrink them. Shine a type of light on the hemorrhoids to cause them to fall off. Do surgery to get rid of the hemorrhoids. Follow these instructions at home: Medicines Take over-the-counter and prescription medicines only as told by your doctor. Use creams with medicine in them or medicines that you put in your butt as told by your doctor. Eating and drinking  Eat foods that have a lot of fiber in them. These include whole grains, beans, nuts, fruits, and vegetables. Ask your doctor about taking products that have fiber added to them (fibersupplements). Take in less fat. You can do this by: Eating low-fat dairy products. Eating less red meat. Staying away from processed foods. Drink enough fluid to keep your pee (urine) pale yellow. Managing pain and swelling  Take a  warm-water bath (sitz bath) for 20 minutes to ease pain. Do this 3-4 times a day. You may do this in a bathtub. You may also use a portable sitz bath that fits over the toilet. If told, put ice on the painful area. It may help to use ice between your warm baths. Put ice in a plastic bag. Place a towel between your skin and the bag. Leave the ice on for 20 minutes, 2-3 times a day. If your skin turns bright red, take off the ice right away to prevent skin damage. The risk of damage is higher if you cannot feel pain, heat, or cold. General instructions Exercise. Ask your doctor how much and what kind of exercise is best for you. Go to the bathroom when you need to poop. Do not wait. Try not to push too hard when you poop. Keep your butt dry and clean. Use wet toilet paper or moist towelettes after you poop. Do not sit on the toilet for a long time. Contact a doctor if: You have pain and swelling that do not get better with treatment. You have trouble pooping. You cannot poop. You have pain or swelling outside the area of the hemorrhoids. Get help right away if: You have bleeding from the butt that will not stop. This information is not intended to replace advice given to you by your health care provider. Make sure you discuss any questions you have with your health care provider. Document Revised: 12/17/2021 Document Reviewed: 12/17/2021 Elsevier Patient Education  2024 [employer].

## 2024-01-13 NOTE — Progress Notes (Signed)
 Subjective:    Patient ID: GER NICKS, female    DOB: [birth year], 63 y.o.   MRN: [id number]  HPI  Discussed the use of AI scribe software for clinical note transcription with the patient, who gave verbal consent to proceed.  Diana [name] is a 63 year old female with IBS who presents with abdominal cramping and bloody mucus in stool.  She has been experiencing severe abdominal cramping and bloody mucus in her stool since Saturday after consuming a sausage and cheese biscuit meal. The cramping is intense, causing her to feel hot and nauseous, and typically resolves after a bowel movement, but this time it persisted. She took Imodium on Saturday night, which stopped the frequent bathroom trips, but she did not have a bowel movement until this morning. The stool was soft and contained bloody mucus. Eating pizza on Monday did not exacerbate her symptoms, but consuming a hot dog and fries on Tuesday led to bloating and trapped gas, with cramping recurring intermittently throughout the week. She had no improvement with gas x.  She has a history of hemorrhoids and notes that the bloody mucus in her stool is unusual for her. She reports that her last colonoscopy in 2023 did not show diverticulosis.  Additionally, she reports new onset of right arm pain over the past two days, describing it as painful and sometimes radiating down to her hand. She is right-handed and denies any repetitive activities or specific injuries. The pain sometimes feels muscular and occurs intermittently. She denies numbness, tingling or weakness of the right upper extremity. She has not tried anything OTC for this.       Review of Systems     Past Medical History:  Diagnosis Date   Allergy    Ear infection    recurrent   Hypertension     Current Outpatient Medications  Medication Sig Dispense Refill   Ascorbic Acid 125 MG CHEW Chew 1 each by mouth daily.     atorvastatin  (LIPITOR) 10 MG tablet Take 1 tablet  (10 mg total) by mouth daily. 90 tablet 1   cetirizine  (ZYRTEC ) 10 MG tablet Take 10 mg by mouth daily as needed.     Cholecalciferol (VITAMIN D3) 25 MCG (1000 UT) CAPS Take 5,000 Units by mouth.      clonazePAM  (KLONOPIN ) 0.5 MG tablet Take 1 tablet (0.5 mg total) by mouth daily as needed. for anxiety 30 tablet 0   hydrocortisone  (ANUSOL -HC) 2.5 % rectal cream Place 1 Application rectally 2 (two) times daily. 30 g 0   ibuprofen  (ADVIL ) 200 MG tablet Take 200 mg by mouth every 6 (six) hours as needed.     losartan -hydrochlorothiazide  (HYZAAR) 50-12.5 MG tablet Take 1 tablet by mouth daily. 90 tablet 1   methocarbamol  (ROBAXIN ) 750 MG tablet Take 1 tablet (750 mg total) by mouth every 8 (eight) hours as needed for muscle spasms. 30 tablet 0   metroNIDAZOLE  (FLAGYL ) 500 MG tablet Take 1 tablet (500 mg total) by mouth 2 (two) times daily. Do not drink alcohol while taking this medicine. 14 tablet 0   omeprazole  (PRILOSEC) 40 MG capsule Take 1 capsule (40 mg total) by mouth 2 (two) times daily. 60 capsule 1   oxybutynin (DITROPAN-XL) 10 MG 24 hr tablet Take 10 mg by mouth daily.     OZEMPIC , 0.25 OR 0.5 MG/DOSE, 2 MG/3ML SOPN Inject 0.5 mg into the skin once a week. 9 mL 1   potassium chloride  (MICRO-K ) 10 MEQ CR capsule  Take 1 capsule (10 mEq total) by mouth daily. 90 capsule 1   traMADol  (ULTRAM ) 50 MG tablet Take 50 mg by mouth every 6 (six) hours as needed.     No current facility-administered medications for this visit.    No Known Allergies  Family History  Problem Relation Age of Onset   Hypertension Mother        alive   Arthritis Mother    Thyroid  disease Mother    Lung cancer Father        deceased   Heart disease Father    Hypertension Maternal Grandmother     Social History   Socioeconomic History   Marital status: Widowed    Spouse name: Not on file   Number of children: 3   Years of education: Not on file   Highest education level: 12th grade  Occupational History    Occupation: customer service    Employer: [employer]  Tobacco Use   Smoking status: Former    Current packs/day: 0.00    Types: Cigarettes    Quit date: 04/20/1985    Years since quitting: 38.7    Passive exposure: Past   Smokeless tobacco: Never   Tobacco comments:    remote tobacco use   Vaping Use   Vaping status: Never Used  Substance and Sexual Activity   Alcohol use: No   Drug use: No   Sexual activity: Not Currently    Birth control/protection: None  Other Topics Concern   Not on file  Social History Narrative   Not on file   Social Drivers of Health   Financial Resource Strain: Low Risk  (10/15/2023)   Overall Financial Resource Strain (CARDIA)    Difficulty of Paying Living Expenses: Not very hard  Food Insecurity: No Food Insecurity (10/15/2023)   Hunger Vital Sign    Worried About Running Out of Food in the Last Year: Never true    Ran Out of Food in the Last Year: Never true  Transportation Needs: No Transportation Needs (10/15/2023)   PRAPARE - Administrator, Civil Service (Medical): No    Lack of Transportation (Non-Medical): No  Physical Activity: Unknown (10/15/2023)   Exercise Vital Sign    Days of Exercise per Week: Patient declined    Minutes of Exercise per Session: Not on file  Stress: No Stress Concern Present (10/15/2023)   [employer] of Occupational Health - Occupational Stress Questionnaire    Feeling of Stress: Not at all  Social Connections: Moderately Isolated (10/15/2023)   Social Connection and Isolation Panel    Frequency of Communication with Friends and Family: More than three times a week    Frequency of Social Gatherings with Friends and Family: Once a week    Attends Religious Services: Never    Database administrator or Organizations: No    Attends Engineer, structural: Not on file    Marital Status: Living with partner  Intimate Partner Violence: Not on file     Constitutional: Denies fever, malaise,  fatigue, headache or abrupt weight changes.  Respiratory: Denies difficulty breathing, shortness of breath, cough or sputum production.   Cardiovascular: Denies chest pain, chest tightness, palpitations or swelling in the hands or feet.  Gastrointestinal: Patient reports abdominal cramping, blood in stool.  Denies bloating, diarrhea.  MSK: Pt reports right arm pain. Denies decrease in range of motion, joint swelling or difficulty with gait. Neurological: Pt reports paresthesia of right lower extremity. Denies  dizziness, difficulty with memory, difficulty with speech or problems with balance and coordination.    No other specific complaints in a complete review of systems (except as listed in HPI above).  Objective:   Physical Exam  BP 128/78 (BP Location: Left Arm, Patient Position: Sitting, Cuff Size: Large)   Ht 5' 7 (1.702 m)   Wt 221 lb 12.8 oz (100.6 kg)   LMP 01/20/2016   BMI 34.74 kg/m     Wt Readings from Last 3 Encounters:  12/21/23 224 lb (101.6 kg)  12/15/23 226 lb (102.5 kg)  10/15/23 230 lb 12.8 oz (104.7 kg)    General: Appears her stated age, obese, in NAD. Skin: Warm, dry and intact.  Cardiovascular: Normal rate and rhythm. S1,S2 noted.  No murmur, rubs or gallops noted.  Pulmonary/Chest: Normal effort and positive vesicular breath sounds. No respiratory distress. No wheezes, rales or ronchi noted.  Abdomen: Soft and nontender. Hypoactive bowel sounds.  Rectal: 1 cm external hemorrhoid, ulcerated and bleeding.  Musculoskeletal: Normal internal and external rotation of the right shoulder. Strength 5/5 BUE. No difficulty with gait.  Neurological: Alert and oriented.     BMET    Component Value Date/Time   NA 139 09/06/2023 1133   K 3.4 (L) 09/06/2023 1133   CL 96 (L) 09/06/2023 1133   CO2 31 09/06/2023 1133   GLUCOSE 116 (H) 09/06/2023 1133   BUN 12 09/06/2023 1133   CREATININE 0.74 09/06/2023 1133   CALCIUM  9.3 09/06/2023 1133   GFRNONAA >60  07/02/2021 2000   GFRAA >60 07/27/2017 2153    Lipid Panel     Component Value Date/Time   CHOL 147 09/06/2023 1133   CHOL 151 12/29/2022 1359   TRIG 104 09/06/2023 1133   TRIG 109 12/29/2022 1359   HDL 20 (L) 09/06/2023 1133   CHOLHDL 7.4 (H) 09/06/2023 1133   VLDL 21.0 02/09/2019 1245   LDLCALC 107 (H) 09/06/2023 1133    CBC    Component Value Date/Time   WBC 4.6 09/06/2023 1133   RBC 4.84 09/06/2023 1133   HGB 14.5 09/06/2023 1133   HCT 44.4 09/06/2023 1133   PLT 229 09/06/2023 1133   MCV 91.7 09/06/2023 1133   MCH 30.0 09/06/2023 1133   MCHC 32.7 09/06/2023 1133   RDW 13.2 09/06/2023 1133   LYMPHSABS 1.1 07/28/2016 1232   MONOABS 0.4 07/28/2016 1232   EOSABS 0.0 07/28/2016 1232   BASOSABS 0.0 07/28/2016 1232    Hgb A1C Lab Results  Component Value Date   HGBA1C 6.2 (H) 09/06/2023           Assessment & Plan:   Assessment and Plan    Ulcerated external hemorrhoid with rectal bleeding Ulcerated external hemorrhoid at 5 o'clock position likely causing rectal bleeding and bloody mucus. No significant pain despite ulceration. - Prescribed proctofoam twice daily for one week. - Advised to avoid straining during bowel movements. - Encouraged increased water intake. - Recommended dietary modifications to reduce irritation.  Irritable bowel syndrome, currently symptomatic IBS symptoms exacerbated with abdominal cramping, bloating, and changes in bowel habits. Relief from diarrhea with Imodium, but soft stools and bloody mucus persist. Symptoms consistent with IBS, no diverticulosis on 2023 colonoscopy. - Recommended dietary modifications to include bland foods for one week. - Prescribed dicyclomine  10 mg four times daily as needed for cramping. - Advised over-the-counter Gas-X for gassiness and bloating. - Consider CT scan if bleeding or pain worsens over the weekend.  Right arm pain, possible musculoskeletal  or nerve etiology Right arm pain from shoulder  to hand, possibly muscular or nerve-related. No clear repetitive activity or trauma. Differential includes musculoskeletal pain or pinched nerve. - Recommended ibuprofen  for pain management. - Monitor symptoms; consider evaluation for pinched nerve if pain persists or worsens.        RTC in 2 months, follow-up chronic conditions Angeline Laura, NP

## 2024-02-21 ENCOUNTER — Ambulatory Visit: Admitting: Orthopedic Surgery

## 2024-03-08 ENCOUNTER — Ambulatory Visit: Admitting: Internal Medicine

## 2024-03-08 ENCOUNTER — Encounter: Payer: Self-pay | Admitting: Internal Medicine

## 2024-03-08 VITALS — BP 114/80 | Ht 67.0 in | Wt 226.0 lb

## 2024-03-08 DIAGNOSIS — M5441 Lumbago with sciatica, right side: Secondary | ICD-10-CM

## 2024-03-08 DIAGNOSIS — I10 Essential (primary) hypertension: Secondary | ICD-10-CM

## 2024-03-08 DIAGNOSIS — E785 Hyperlipidemia, unspecified: Secondary | ICD-10-CM

## 2024-03-08 DIAGNOSIS — F5102 Adjustment insomnia: Secondary | ICD-10-CM | POA: Diagnosis not present

## 2024-03-08 DIAGNOSIS — N3281 Overactive bladder: Secondary | ICD-10-CM | POA: Insufficient documentation

## 2024-03-08 DIAGNOSIS — E119 Type 2 diabetes mellitus without complications: Secondary | ICD-10-CM | POA: Diagnosis not present

## 2024-03-08 DIAGNOSIS — Z23 Encounter for immunization: Secondary | ICD-10-CM

## 2024-03-08 DIAGNOSIS — B181 Chronic viral hepatitis B without delta-agent: Secondary | ICD-10-CM

## 2024-03-08 DIAGNOSIS — E66812 Obesity, class 2: Secondary | ICD-10-CM | POA: Diagnosis not present

## 2024-03-08 DIAGNOSIS — Z7985 Long-term (current) use of injectable non-insulin antidiabetic drugs: Secondary | ICD-10-CM

## 2024-03-08 DIAGNOSIS — K760 Fatty (change of) liver, not elsewhere classified: Secondary | ICD-10-CM

## 2024-03-08 DIAGNOSIS — G8929 Other chronic pain: Secondary | ICD-10-CM

## 2024-03-08 DIAGNOSIS — F419 Anxiety disorder, unspecified: Secondary | ICD-10-CM

## 2024-03-08 DIAGNOSIS — E1169 Type 2 diabetes mellitus with other specified complication: Secondary | ICD-10-CM

## 2024-03-08 DIAGNOSIS — K219 Gastro-esophageal reflux disease without esophagitis: Secondary | ICD-10-CM

## 2024-03-08 NOTE — Assessment & Plan Note (Signed)
 Stable on clonazepam  0.5 mg daily as needed Will monitor

## 2024-03-08 NOTE — Patient Instructions (Signed)

## 2024-03-08 NOTE — Progress Notes (Signed)
 Subjective:    Patient ID: Diana [name], female    DOB: March 18, 1961, 63 y.o.   MRN: [id number]  HPI  Patient presents to clinic today for follow-up of chronic conditions.  HTN: Her BP today is 114/80.  She is taking losartan  HCT as prescribed.  ECG from 06/2021 reviewed.  Anxiety: Persistent.  Managed with clonazepam  as needed.  She is not currently seeing a therapist.  She denies depression, SI/HI.  GERD: She is not sure what triggers this.  She denies breakthrough on omeprazole .  There is no upper GI on file.  Chronic hep B: In remission status post antiviral therapy.  She follows with GI.  Chronic low back pain with right-sided sciatica: Managed with methocarbamol  and tramadol .  She is no longer taking gabapentin .  MRI from 08/2022 reviewed.  She is following with neurosurgery.  DM2: Her last A1c was 6.2%, 08/2023.  She is taking semaglutide  as prescribed.  She does not check her sugars.  She checks her feet routinely.  Her last eye exam was 04/2023.  Flu never.  Pneumovax never.  COVID never.  HLD: Her last LDL was 107, triglycerides 104, 08/2023.  She is not taking atorvastatin  due to mylagias.  She does not consume low-fat diet.  Insomnia: She has difficulty falling asleep.  She is no longer taking trazodone  as prescribed.  Sleep study from 04/2022 reviewed.  OAB: She reports mainly urinary frequency.  She is taking oxybutynin only as needed.  She follows with urology.  Review of Systems     Past Medical History:  Diagnosis Date   Allergy    Ear infection    recurrent   Hypertension     Current Outpatient Medications  Medication Sig Dispense Refill   Ascorbic Acid 125 MG CHEW Chew 1 each by mouth daily.     atorvastatin  (LIPITOR) 10 MG tablet Take 1 tablet (10 mg total) by mouth daily. 90 tablet 1   cetirizine  (ZYRTEC ) 10 MG tablet Take 10 mg by mouth daily as needed.     Cholecalciferol (VITAMIN D3) 25 MCG (1000 UT) CAPS Take 5,000 Units by mouth.      clonazePAM   (KLONOPIN ) 0.5 MG tablet Take 1 tablet (0.5 mg total) by mouth daily as needed. for anxiety 30 tablet 0   dicyclomine  (BENTYL ) 10 MG capsule Take 1 capsule (10 mg total) by mouth 4 (four) times daily -  before meals and at bedtime. 30 capsule 0   hydrocortisone  (ANUSOL -HC) 2.5 % rectal cream Place 1 Application rectally 2 (two) times daily. 30 g 0   hydrocortisone -pramoxine (PROCTOFOAM-HC) rectal foam Place 1 applicator rectally 2 (two) times daily. 10 g 0   ibuprofen  (ADVIL ) 200 MG tablet Take 200 mg by mouth every 6 (six) hours as needed.     losartan -hydrochlorothiazide  (HYZAAR) 50-12.5 MG tablet Take 1 tablet by mouth daily. 90 tablet 1   methocarbamol  (ROBAXIN ) 750 MG tablet Take 1 tablet (750 mg total) by mouth every 8 (eight) hours as needed for muscle spasms. 30 tablet 0   omeprazole  (PRILOSEC) 40 MG capsule Take 1 capsule (40 mg total) by mouth 2 (two) times daily. 60 capsule 1   oxybutynin (DITROPAN-XL) 10 MG 24 hr tablet Take 10 mg by mouth daily.     OZEMPIC , 0.25 OR 0.5 MG/DOSE, 2 MG/3ML SOPN Inject 0.5 mg into the skin once a week. 9 mL 1   potassium chloride  (MICRO-K ) 10 MEQ CR capsule Take 1 capsule (10 mEq total) by mouth daily. 90  capsule 1   traMADol  (ULTRAM ) 50 MG tablet Take 50 mg by mouth every 6 (six) hours as needed.     No current facility-administered medications for this visit.    No Known Allergies  Family History  Problem Relation Age of Onset   Hypertension Mother        alive   Arthritis Mother    Thyroid  disease Mother    Lung cancer Father        deceased   Heart disease Father    Hypertension Maternal Grandmother     Social History   Socioeconomic History   Marital status: Widowed    Spouse name: Not on file   Number of children: 3   Years of education: Not on file   Highest education level: 12th grade  Occupational History   Occupation: customer service    Employer: [employer]  Tobacco Use   Smoking status: Former    Current packs/day:  0.00    Types: Cigarettes    Quit date: 04/20/1985    Years since quitting: 38.9    Passive exposure: Past   Smokeless tobacco: Never   Tobacco comments:    remote tobacco use   Vaping Use   Vaping status: Never Used  Substance and Sexual Activity   Alcohol use: No   Drug use: No   Sexual activity: Not Currently    Birth control/protection: None  Other Topics Concern   Not on file  Social History Narrative   Not on file   Social Drivers of Health   Financial Resource Strain: Low Risk  (03/04/2024)   Overall Financial Resource Strain (CARDIA)    Difficulty of Paying Living Expenses: Not very hard  Food Insecurity: No Food Insecurity (03/04/2024)   Hunger Vital Sign    Worried About Running Out of Food in the Last Year: Never true    Ran Out of Food in the Last Year: Never true  Transportation Needs: No Transportation Needs (03/04/2024)   PRAPARE - Administrator, Civil Service (Medical): No    Lack of Transportation (Non-Medical): No  Physical Activity: Inactive (03/04/2024)   Exercise Vital Sign    Days of Exercise per Week: 0 days    Minutes of Exercise per Session: Not on file  Stress: No Stress Concern Present (03/04/2024)   [employer] of Occupational Health - Occupational Stress Questionnaire    Feeling of Stress: Only a little  Social Connections: Moderately Isolated (03/04/2024)   Social Connection and Isolation Panel    Frequency of Communication with Friends and Family: More than three times a week    Frequency of Social Gatherings with Friends and Family: Once a week    Attends Religious Services: Never    Database Administrator or Organizations: No    Attends Engineer, Structural: Not on file    Marital Status: Living with partner  Intimate Partner Violence: Not on file     Constitutional: Denies fever, malaise, fatigue, headache or abrupt weight changes.  HEENT: Denies eye pain, eye redness, ear pain, ringing in the ears, wax  buildup, runny nose, nasal congestion, bloody nose, or sore throat. Respiratory: Denies difficulty breathing, shortness of breath, cough or sputum production.   Cardiovascular: Denies chest pain, chest tightness, palpitations or swelling in the hands or feet.  Gastrointestinal: Denies abdominal pain bloating, constipation, diarrhea or blood in the stool.  GU: Denies urgency, frequency, pain with urination, burning sensation, blood in urine, odor or  discharge. Musculoskeletal: Patient reports neck pain, chronic low back pain.  Denies decrease in range of motion, difficulty with gait, or joint swelling.  Skin: Denies redness, rashes, lesions or ulcercations.  Neurological: Patient reports insomnia, paresthesia of right lower extremity.  Denies dizziness, difficulty with memory, difficulty with speech or problems with balance and coordination.  Psych: Patient has history of anxiety.  Denies depression, SI/HI.  No other specific complaints in a complete review of systems (except as listed in HPI above).  Objective:   Physical Exam  BP 114/80 (BP Location: Left Arm, Patient Position: Sitting, Cuff Size: Large)   Ht 5' 7 (1.702 m)   Wt 226 lb (102.5 kg)   LMP 01/20/2016   BMI 35.40 kg/m    Wt Readings from Last 3 Encounters:  01/13/24 221 lb 12.8 oz (100.6 kg)  12/21/23 224 lb (101.6 kg)  12/15/23 226 lb (102.5 kg)    General: Appears her stated age, obese, in NAD. Skin: Warm, dry and intact. No ulcerations noted. HEENT: Head: normal shape and size; Eyes: sclera white, no icterus, conjunctiva pink, PERRLA and EOMs intact;   Cardiovascular: Normal rate and rhythm. S1,S2 noted.  No murmur, rubs or gallops noted. No JVD or BLE edema. No carotid bruits noted. Pulmonary/Chest: Normal effort and positive vesicular breath sounds. No respiratory distress. No wheezes, rales or ronchi noted.  Abdomen: Soft and nontender. Normal bowel sounds.  Musculoskeletal: Pain with palpation over the lumbar  spine.  No difficulty with gait.  Neurological: Alert and oriented. Coordination normal.  Psychiatric: Mood and affect normal. Behavior is normal. Judgment and thought content normal.    BMET    Component Value Date/Time   NA 139 09/06/2023 1133   K 3.4 (L) 09/06/2023 1133   CL 96 (L) 09/06/2023 1133   CO2 31 09/06/2023 1133   GLUCOSE 116 (H) 09/06/2023 1133   BUN 12 09/06/2023 1133   CREATININE 0.74 09/06/2023 1133   CALCIUM  9.3 09/06/2023 1133   GFRNONAA >60 07/02/2021 2000   GFRAA >60 07/27/2017 2153    Lipid Panel     Component Value Date/Time   CHOL 147 09/06/2023 1133   CHOL 151 12/29/2022 1359   TRIG 104 09/06/2023 1133   TRIG 109 12/29/2022 1359   HDL 20 (L) 09/06/2023 1133   CHOLHDL 7.4 (H) 09/06/2023 1133   VLDL 21.0 02/09/2019 1245   LDLCALC 107 (H) 09/06/2023 1133    CBC    Component Value Date/Time   WBC 4.6 09/06/2023 1133   RBC 4.84 09/06/2023 1133   HGB 14.5 09/06/2023 1133   HCT 44.4 09/06/2023 1133   PLT 229 09/06/2023 1133   MCV 91.7 09/06/2023 1133   MCH 30.0 09/06/2023 1133   MCHC 32.7 09/06/2023 1133   RDW 13.2 09/06/2023 1133   LYMPHSABS 1.1 07/28/2016 1232   MONOABS 0.4 07/28/2016 1232   EOSABS 0.0 07/28/2016 1232   BASOSABS 0.0 07/28/2016 1232    Hgb A1C Lab Results  Component Value Date   HGBA1C 6.2 (H) 09/06/2023           Assessment & Plan:     RTC in 6 months for your annual exam Angeline Laura, NP

## 2024-03-08 NOTE — Assessment & Plan Note (Signed)
 Complicated by morbid obesity Controlled on losartan  HCT 50-12.5 mg daily Reinforced DASH diet and exercise for weight loss C-Met today

## 2024-03-08 NOTE — Assessment & Plan Note (Signed)
 Continue oxybutynin 10 mg daily as needed Will continue to follow with urology

## 2024-03-08 NOTE — Assessment & Plan Note (Signed)
 Complicated by morbid obesity C-Met and lipid profile today Encouraged her to consume a low-fat diet He is not taking atorvastatin  10 mg 3 times weekly as previously prescribed, will consider restarting this based on labs

## 2024-03-08 NOTE — Assessment & Plan Note (Signed)
 Complicated by morbid obesity Encouraged low-fat diet and exercise for weight loss She will continue to follow with GI/hepatology

## 2024-03-08 NOTE — Assessment & Plan Note (Signed)
 Complicated by morbid obesity Avoid foods that trigger reflux Encourage weight loss as this can help reduce reflux symptoms Continue omeprazole  40 mg twice daily

## 2024-03-08 NOTE — Assessment & Plan Note (Signed)
 Encourage diet and exercise for weight loss

## 2024-03-08 NOTE — Assessment & Plan Note (Signed)
 Not medicated We will monitor

## 2024-03-08 NOTE — Assessment & Plan Note (Signed)
 Complicated by morbid obesity A1c urine microalbumin today Encourage low-carb diet and exercise for weight loss Sinew semaglutide  0.5 mg weekly Encourage routine eye exam Encouraged foot exam She declines immunizations

## 2024-03-08 NOTE — Assessment & Plan Note (Signed)
 Complicated by morbid obesity Encourage regular stretching and core strengthening Continue methocarbamol  500 mg 3 times daily as needed and tramadol  50 mg daily as needed as previously prescribed She will follow-up with neurosurgery as needed

## 2024-03-08 NOTE — Assessment & Plan Note (Signed)
 Asymptomatic She will continue to follow with GI/hepatology

## 2024-03-09 ENCOUNTER — Ambulatory Visit: Payer: Self-pay | Admitting: Internal Medicine

## 2024-03-09 DIAGNOSIS — R1024 Suprapubic pain: Secondary | ICD-10-CM

## 2024-03-09 LAB — MICROALBUMIN / CREATININE URINE RATIO
Creatinine, Urine: 183 mg/dL (ref 20–275)
Microalb Creat Ratio: 4 mg/g{creat} (ref <30)
Microalb, Ur: 0.7 mg/dL

## 2024-03-09 LAB — CBC
HCT: 45.3 % — ABNORMAL HIGH (ref 35.0–45.0)
Hemoglobin: 15 g/dL (ref 11.7–15.5)
MCH: 30.5 pg (ref 27.0–33.0)
MCHC: 33.1 g/dL (ref 32.0–36.0)
MCV: 92.1 fL (ref 80.0–100.0)
MPV: 10.8 fL (ref 7.5–12.5)
Platelets: 278 Thousand/uL (ref 140–400)
RBC: 4.92 Million/uL (ref 3.80–5.10)
RDW: 13 % (ref 11.0–15.0)
WBC: 4.9 Thousand/uL (ref 3.8–10.8)

## 2024-03-09 LAB — COMPREHENSIVE METABOLIC PANEL WITH GFR
AG Ratio: 1.8 (calc) (ref 1.0–2.5)
ALT: 15 U/L (ref 6–29)
AST: 15 U/L (ref 10–35)
Albumin: 4.5 g/dL (ref 3.6–5.1)
Alkaline phosphatase (APISO): 90 U/L (ref 37–153)
BUN: 12 mg/dL (ref 7–25)
CO2: 28 mmol/L (ref 20–32)
Calcium: 9.5 mg/dL (ref 8.6–10.4)
Chloride: 99 mmol/L (ref 98–110)
Creat: 0.67 mg/dL (ref 0.50–1.05)
Globulin: 2.5 g/dL (ref 1.9–3.7)
Glucose, Bld: 117 mg/dL — ABNORMAL HIGH (ref 65–99)
Potassium: 3.7 mmol/L (ref 3.5–5.3)
Sodium: 138 mmol/L (ref 135–146)
Total Bilirubin: 1.5 mg/dL — ABNORMAL HIGH (ref 0.2–1.2)
Total Protein: 7 g/dL (ref 6.1–8.1)
eGFR: 99 mL/min/1.73m2 (ref >=60)

## 2024-03-09 LAB — LIPID PANEL
Cholesterol: 237 mg/dL — ABNORMAL HIGH (ref <200)
HDL: 24 mg/dL — ABNORMAL LOW (ref >=50)
LDL Cholesterol (Calc): 195 mg/dL — ABNORMAL HIGH
Non-HDL Cholesterol (Calc): 213 mg/dL — ABNORMAL HIGH (ref <130)
Total CHOL/HDL Ratio: 9.9 (calc) — ABNORMAL HIGH (ref <5.0)
Triglycerides: 78 mg/dL (ref <150)

## 2024-03-09 LAB — HEMOGLOBIN A1C
Hgb A1c MFr Bld: 6 % — ABNORMAL HIGH (ref <5.7)
Mean Plasma Glucose: 126 mg/dL
eAG (mmol/L): 7 mmol/L

## 2024-03-21 ENCOUNTER — Other Ambulatory Visit: Payer: Self-pay

## 2024-03-21 DIAGNOSIS — R1024 Suprapubic pain: Secondary | ICD-10-CM

## 2024-03-21 MED ORDER — METHOCARBAMOL 750 MG PO TABS: 750.0000 mg | ORAL_TABLET | Freq: Three times a day (TID) | ORAL | 0 refills | Status: AC | PRN

## 2024-03-21 MED ORDER — OMEPRAZOLE 40 MG PO CPDR: 40.0000 mg | DELAYED_RELEASE_CAPSULE | Freq: Two times a day (BID) | ORAL | 1 refills | Status: AC

## 2024-03-21 MED ORDER — OMEPRAZOLE 40 MG PO CPDR: 40.0000 mg | DELAYED_RELEASE_CAPSULE | Freq: Two times a day (BID) | ORAL | 1 refills | Status: DC

## 2024-03-24 NOTE — Progress Notes (Deleted)
 Referring Physician:  Antonette Angeline ORN, NP [address] [place],  [place] [address]  Primary Physician:  Antonette Angeline ORN, NP  History of Present Illness: [name] has a history of HTN, GERD, chronic hep B, hyperlipidemia, DM, and prediabetes.   History of chronic constant LBP with right leg pain with flare ups. She has known lumbar spondylosis with disc bulging at L4-S1. Mild central stenosis L4-L5.   Last seen by me on 12/21/23 for constant LBP with intermittent burning in buttocks with sitting. She has known lumbar spondylosis with disc bulging at L4-S1. Mild central stenosis L4-L5.   Injections with PMR discussed and she declined. She was to start PT at [facility]- she has not done this.   She is here for follow up.        She did not see much relief with PT. She continues with constant LBP with intermittent burning pain in her buttocks with sitting. She has intermittent right > left posterior leg pain. She can only shop/walk for about a 90 minutes before she has to stop due to LBP/leg pain. She has numbness in her legs. No weakness. Her pain is worse in the morning and better as she moves around.   She is taking prn motrin  and robaxin . She stopped neurontin  as it was not helping.   She is retired.   She does not smoke. She quit in 1987.    Bowel/Bladder Dysfunction: none  Conservative measures:  Physical therapy: [facility]- initial eval on 08/12/23 and she did 5 more visits through 09/07/23 Multimodal medical therapy including regular antiinflammatories: prednisone  taper, robaxin , mobic , tramadol   Injections:  Had 9-10 years ago with no relief   Past Surgery: No spinal surgery   Review of Systems:  A 10 point review of systems is negative, except for the pertinent positives and negatives detailed in the HPI.  Past Medical History: Past Medical History:  Diagnosis Date   Allergy    Ear infection    recurrent   Hypertension     Past Surgical History: Past  Surgical History:  Procedure Laterality Date   ABDOMINAL HYSTERECTOMY  2019   partial   COLONOSCOPY WITH PROPOFOL  N/A 11/10/2021   Procedure: COLONOSCOPY WITH PROPOFOL ;  Surgeon: Unk Corinn Skiff, MD;  Location: [facility] ENDOSCOPY;  Service: Gastroenterology;  Laterality: N/A;   TONSILLECTOMY     TUBAL LIGATION     bilateral    Allergies: Allergies as of 03/28/2024   (No Known Allergies)    Medications: Outpatient Encounter Medications as of 03/28/2024  Medication Sig   Ascorbic Acid 125 MG CHEW Chew 1 each by mouth daily.   atorvastatin  (LIPITOR) 10 MG tablet Take 1 tablet (10 mg total) by mouth daily.   cetirizine  (ZYRTEC ) 10 MG tablet Take 10 mg by mouth daily as needed.   Cholecalciferol (VITAMIN D3) 25 MCG (1000 UT) CAPS Take 5,000 Units by mouth.    clonazePAM  (KLONOPIN ) 0.5 MG tablet Take 1 tablet (0.5 mg total) by mouth daily as needed. for anxiety   hydrocortisone  (ANUSOL -HC) 2.5 % rectal cream Place 1 Application rectally 2 (two) times daily. (Patient taking differently: Place 1 Application rectally as needed.)   ibuprofen  (ADVIL ) 200 MG tablet Take 200 mg by mouth every 6 (six) hours as needed.   losartan -hydrochlorothiazide  (HYZAAR) 50-12.5 MG tablet Take 1 tablet by mouth daily.   methocarbamol  (ROBAXIN ) 750 MG tablet Take 1 tablet (750 mg total) by mouth every 8 (eight) hours as needed for muscle spasms.  omeprazole  (PRILOSEC) 40 MG capsule Take 1 capsule (40 mg total) by mouth 2 (two) times daily.   oxybutynin (DITROPAN-XL) 10 MG 24 hr tablet Take 10 mg by mouth daily.   OZEMPIC , 0.25 OR 0.5 MG/DOSE, 2 MG/3ML SOPN Inject 0.5 mg into the skin once a week.   potassium chloride  (MICRO-K ) 10 MEQ CR capsule Take 1 capsule (10 mEq total) by mouth daily.   traMADol  (ULTRAM ) 50 MG tablet Take 50 mg by mouth every 6 (six) hours as needed.   No facility-administered encounter medications on file as of 03/28/2024.    Social History: Social History   Tobacco Use   Smoking  status: Former    Current packs/day: 0.00    Types: Cigarettes    Quit date: 04/20/1985    Years since quitting: 38.9    Passive exposure: Past   Smokeless tobacco: Never   Tobacco comments:    remote tobacco use   Vaping Use   Vaping status: Never Used  Substance Use Topics   Alcohol use: No   Drug use: No    Family Medical History: Family History  Problem Relation Age of Onset   Hypertension Mother        alive   Arthritis Mother    Thyroid  disease Mother    Lung cancer Father        deceased   Heart disease Father    Hypertension Maternal Grandmother     Physical Examination: There were no vitals filed for this visit.   Awake, alert, oriented to person, place, and time.  Speech is clear and fluent. Fund of knowledge is appropriate.   Cranial Nerves: Pupils equal round and reactive to light.  Facial tone is symmetric.    No abnormal lesions on exposed skin.   She has diffuse lower lumbar tenderness.   Strength:  Side Iliopsoas Quads Hamstring PF DF EHL  R 5 5 5 5 5 5   L 5 5 5 5 5 5    Reflexes are 2+ and symmetric at the patella and achilles.    Clonus is not present.   Bilateral lower extremity sensation is intact to light touch.   Gait is normal.    Medical Decision Making  Imaging: None   Assessment and Plan: Diana [name] has constant LBP with intermittent burning pain in her buttocks with sitting. She has intermittent right > left posterior leg pain. She can only shop/walk for about a 90 minutes before she has to stop due to LBP/leg pain. She has numbness in her legs. No weakness.    She has know lumbar spondylosis with disc bulging at L4-S1. Mild central stenosis L4-L5.    Treatment options discussed with patient and following plan made:   - PT for lumbar spine. Orders to [facility]- she will call them to schedule a visit.  - Continue current medications including prn motrin  and robaxin . Reviewed dosing and side effects. Take motrin  with food. -  Discussed possible injections with PMR. She declines for now.  - Follow up with me in 8 weeks and prn.   I spent a total of 20 minutes in face-to-face and non-face-to-face activities related to this patient's care today including review of outside records, review of imaging, review of symptoms, physical exam, discussion of differential diagnosis, discussion of treatment options, and documentation.   Glade Boys PA-C Dept. of Neurosurgery

## 2024-03-27 NOTE — Progress Notes (Deleted)
 Referring Physician:  Antonette Angeline ORN, NP [address] [place],  [place] [address]  Primary Physician:  Antonette Angeline ORN, NP  History of Present Illness: Diana [name] has a history of HTN, GERD, chronic hep B, hyperlipidemia, DM, and prediabetes.   History of chronic constant LBP with right leg pain with flare ups. She has known lumbar spondylosis with disc bulging at L4-S1. Mild central stenosis L4-L5.   Last seen by me on 12/21/23 for constant LBP with intermittent burning in buttocks with sitting. She has known lumbar spondylosis with disc bulging at L4-S1. Mild central stenosis L4-L5.   Injections with PMR discussed and she declined. She was to start PT at [facility]- she has not done this.   PCP wanted her seen prior to starting any PT.          She did not see much relief with PT. She continues with constant LBP with intermittent burning pain in her buttocks with sitting. She has intermittent right > left posterior leg pain. She can only shop/walk for about a 90 minutes before she has to stop due to LBP/leg pain. She has numbness in her legs. No weakness. Her pain is worse in the morning and better as she moves around.   She is taking prn motrin  and robaxin . She stopped neurontin  as it was not helping.   She is retired.   She does not smoke. She quit in 1987.    Bowel/Bladder Dysfunction: none  Conservative measures:  Physical therapy: [facility]- initial eval on 08/12/23 and she did 5 more visits through 09/07/23 Multimodal medical therapy including regular antiinflammatories: prednisone  taper, robaxin , mobic , tramadol   Injections:  Had 9-10 years ago with no relief   Past Surgery: No spinal surgery   Review of Systems:  A 10 point review of systems is negative, except for the pertinent positives and negatives detailed in the HPI.  Past Medical History: Past Medical History:  Diagnosis Date   Allergy    Ear infection    recurrent   Hypertension     Past  Surgical History: Past Surgical History:  Procedure Laterality Date   ABDOMINAL HYSTERECTOMY  2019   partial   COLONOSCOPY WITH PROPOFOL  N/A 11/10/2021   Procedure: COLONOSCOPY WITH PROPOFOL ;  Surgeon: Unk Corinn Skiff, MD;  Location: [facility] ENDOSCOPY;  Service: Gastroenterology;  Laterality: N/A;   TONSILLECTOMY     TUBAL LIGATION     bilateral    Allergies: Allergies as of 03/29/2024   (No Known Allergies)    Medications: Outpatient Encounter Medications as of 03/29/2024  Medication Sig   Ascorbic Acid 125 MG CHEW Chew 1 each by mouth daily.   atorvastatin  (LIPITOR) 10 MG tablet Take 1 tablet (10 mg total) by mouth daily.   cetirizine  (ZYRTEC ) 10 MG tablet Take 10 mg by mouth daily as needed.   Cholecalciferol (VITAMIN D3) 25 MCG (1000 UT) CAPS Take 5,000 Units by mouth.    clonazePAM  (KLONOPIN ) 0.5 MG tablet Take 1 tablet (0.5 mg total) by mouth daily as needed. for anxiety   hydrocortisone  (ANUSOL -HC) 2.5 % rectal cream Place 1 Application rectally 2 (two) times daily. (Patient taking differently: Place 1 Application rectally as needed.)   ibuprofen  (ADVIL ) 200 MG tablet Take 200 mg by mouth every 6 (six) hours as needed.   losartan -hydrochlorothiazide  (HYZAAR) 50-12.5 MG tablet Take 1 tablet by mouth daily.   methocarbamol  (ROBAXIN ) 750 MG tablet Take 1 tablet (750 mg total) by mouth every 8 (eight) hours  as needed for muscle spasms.   omeprazole  (PRILOSEC) 40 MG capsule Take 1 capsule (40 mg total) by mouth 2 (two) times daily.   oxybutynin (DITROPAN-XL) 10 MG 24 hr tablet Take 10 mg by mouth daily.   OZEMPIC , 0.25 OR 0.5 MG/DOSE, 2 MG/3ML SOPN Inject 0.5 mg into the skin once a week.   potassium chloride  (MICRO-K ) 10 MEQ CR capsule Take 1 capsule (10 mEq total) by mouth daily.   traMADol  (ULTRAM ) 50 MG tablet Take 50 mg by mouth every 6 (six) hours as needed.   No facility-administered encounter medications on file as of 03/29/2024.    Social History: Social History    Tobacco Use   Smoking status: Former    Current packs/day: 0.00    Types: Cigarettes    Quit date: 04/20/1985    Years since quitting: 38.9    Passive exposure: Past   Smokeless tobacco: Never   Tobacco comments:    remote tobacco use   Vaping Use   Vaping status: Never Used  Substance Use Topics   Alcohol use: No   Drug use: No    Family Medical History: Family History  Problem Relation Age of Onset   Hypertension Mother        alive   Arthritis Mother    Thyroid  disease Mother    Lung cancer Father        deceased   Heart disease Father    Hypertension Maternal Grandmother     Physical Examination: There were no vitals filed for this visit.   Awake, alert, oriented to person, place, and time.  Speech is clear and fluent. Fund of knowledge is appropriate.   Cranial Nerves: Pupils equal round and reactive to light.  Facial tone is symmetric.    No abnormal lesions on exposed skin.   She has diffuse lower lumbar tenderness.   Strength:  Side Iliopsoas Quads Hamstring PF DF EHL  R 5 5 5 5 5 5   L 5 5 5 5 5 5    Reflexes are 2+ and symmetric at the patella and achilles.    Clonus is not present.   Bilateral lower extremity sensation is intact to light touch.   Gait is normal.    Medical Decision Making  Imaging: None   Assessment and Plan: Diana [name] has constant LBP with intermittent burning pain in her buttocks with sitting. She has intermittent right > left posterior leg pain. She can only shop/walk for about a 90 minutes before she has to stop due to LBP/leg pain. She has numbness in her legs. No weakness.    She has know lumbar spondylosis with disc bulging at L4-S1. Mild central stenosis L4-L5.    Treatment options discussed with patient and following plan made:   - PT for lumbar spine. Orders to [facility]- she will call them to schedule a visit.  - Continue current medications including prn motrin  and robaxin . Reviewed dosing and side effects. Take  motrin  with food. - Discussed possible injections with PMR. She declines for now.  - Follow up with me in 8 weeks and prn.   I spent a total of 20 minutes in face-to-face and non-face-to-face activities related to this patient's care today including review of outside records, review of imaging, review of symptoms, physical exam, discussion of differential diagnosis, discussion of treatment options, and documentation.   Glade Boys PA-C Dept. of Neurosurgery

## 2024-03-28 ENCOUNTER — Ambulatory Visit: Admitting: Orthopedic Surgery

## 2024-03-29 ENCOUNTER — Ambulatory Visit: Admitting: Orthopedic Surgery

## 2024-04-18 ENCOUNTER — Ambulatory Visit: Admitting: Orthopedic Surgery

## 2024-04-24 ENCOUNTER — Other Ambulatory Visit: Payer: Self-pay | Admitting: Internal Medicine

## 2024-04-24 NOTE — Progress Notes (Unsigned)
 "  Referring Physician:  Antonette Angeline ORN, NP [address] [place],  [place] [address]  Primary Physician:  Antonette Angeline ORN, NP  History of Present Illness: [name] has a history of HTN, GERD, chronic hep B, hyperlipidemia, DM, and prediabetes.   History of chronic constant LBP with right leg pain with flare ups. She has known lumbar spondylosis with disc bulging at L4-S1. Mild central stenosis L4-L5.   Last seen by me on 12/21/23 for constant LBP with intermittent burning in buttocks with sitting. She has known lumbar spondylosis with disc bulging at L4-S1. Mild central stenosis L4-L5.   Injections with PMR discussed and she declined. She was to start PT at [facility]- she has not done this.   PCP wanted her seen prior to starting any PT.          She did not see much relief with PT. She continues with constant LBP with intermittent burning pain in her buttocks with sitting. She has intermittent right > left posterior leg pain. She can only shop/walk for about a 90 minutes before she has to stop due to LBP/leg pain. She has numbness in her legs. No weakness. Her pain is worse in the morning and better as she moves around.   She is taking prn motrin  and robaxin . She stopped neurontin  as it was not helping.   She is retired.   She does not smoke. She quit in 1987.    Bowel/Bladder Dysfunction: none  Conservative measures:  Physical therapy: [facility]- initial eval on 08/12/23 and she did 5 more visits through 09/07/23 Multimodal medical therapy including regular antiinflammatories: prednisone  taper, robaxin , mobic , tramadol   Injections:  Had 9-10 years ago with no relief   Past Surgery: No spinal surgery   Review of Systems:  A 10 point review of systems is negative, except for the pertinent positives and negatives detailed in the HPI.  Past Medical History: Past Medical History:  Diagnosis Date   Allergy    Ear infection    recurrent   Hypertension     Past  Surgical History: Past Surgical History:  Procedure Laterality Date   ABDOMINAL HYSTERECTOMY  2019   partial   COLONOSCOPY WITH PROPOFOL  N/A 11/10/2021   Procedure: COLONOSCOPY WITH PROPOFOL ;  Surgeon: Unk Corinn Skiff, MD;  Location: [facility] ENDOSCOPY;  Service: Gastroenterology;  Laterality: N/A;   TONSILLECTOMY     TUBAL LIGATION     bilateral    Allergies: Allergies as of 05/04/2024   (No Known Allergies)    Medications: Outpatient Encounter Medications as of 05/04/2024  Medication Sig   Ascorbic Acid 125 MG CHEW Chew 1 each by mouth daily.   atorvastatin  (LIPITOR) 10 MG tablet Take 1 tablet (10 mg total) by mouth daily.   cetirizine  (ZYRTEC ) 10 MG tablet Take 10 mg by mouth daily as needed.   Cholecalciferol (VITAMIN D3) 25 MCG (1000 UT) CAPS Take 5,000 Units by mouth.    clonazePAM  (KLONOPIN ) 0.5 MG tablet Take 1 tablet (0.5 mg total) by mouth daily as needed. for anxiety   hydrocortisone  (ANUSOL -HC) 2.5 % rectal cream Place 1 Application rectally 2 (two) times daily. (Patient taking differently: Place 1 Application rectally as needed.)   ibuprofen  (ADVIL ) 200 MG tablet Take 200 mg by mouth every 6 (six) hours as needed.   losartan -hydrochlorothiazide  (HYZAAR) 50-12.5 MG tablet Take 1 tablet by mouth daily.   methocarbamol  (ROBAXIN ) 750 MG tablet Take 1 tablet (750 mg total) by mouth every 8 (eight)  hours as needed for muscle spasms.   omeprazole  (PRILOSEC) 40 MG capsule Take 1 capsule (40 mg total) by mouth 2 (two) times daily.   oxybutynin (DITROPAN-XL) 10 MG 24 hr tablet Take 10 mg by mouth daily.   OZEMPIC , 0.25 OR 0.5 MG/DOSE, 2 MG/3ML SOPN Inject 0.5 mg into the skin once a week.   potassium chloride  (MICRO-K ) 10 MEQ CR capsule Take 1 capsule (10 mEq total) by mouth daily.   traMADol  (ULTRAM ) 50 MG tablet Take 50 mg by mouth every 6 (six) hours as needed.   No facility-administered encounter medications on file as of 05/04/2024.    Social History: Social History    Tobacco Use   Smoking status: Former    Current packs/day: 0.00    Types: Cigarettes    Quit date: 04/20/1985    Years since quitting: 39.0    Passive exposure: Past   Smokeless tobacco: Never   Tobacco comments:    remote tobacco use   Vaping Use   Vaping status: Never Used  Substance Use Topics   Alcohol use: No   Drug use: No    Family Medical History: Family History  Problem Relation Age of Onset   Hypertension Mother        alive   Arthritis Mother    Thyroid  disease Mother    Lung cancer Father        deceased   Heart disease Father    Hypertension Maternal Grandmother     Physical Examination: There were no vitals filed for this visit.   Awake, alert, oriented to person, place, and time.  Speech is clear and fluent. Fund of knowledge is appropriate.   Cranial Nerves: Pupils equal round and reactive to light.  Facial tone is symmetric.    No abnormal lesions on exposed skin.   She has diffuse lower lumbar tenderness.   Strength:  Side Iliopsoas Quads Hamstring PF DF EHL  R 5 5 5 5 5 5   L 5 5 5 5 5 5    Reflexes are 2+ and symmetric at the patella and achilles.    Clonus is not present.   Bilateral lower extremity sensation is intact to light touch.   Gait is normal.    Medical Decision Making  Imaging: None   Assessment and Plan: Ms. Carberry has constant LBP with intermittent burning pain in her buttocks with sitting. She has intermittent right > left posterior leg pain. She can only shop/walk for about a 90 minutes before she has to stop due to LBP/leg pain. She has numbness in her legs. No weakness.    She has know lumbar spondylosis with disc bulging at L4-S1. Mild central stenosis L4-L5.    Treatment options discussed with patient and following plan made:   - PT for lumbar spine. Orders to [facility]- she will call them to schedule a visit.  - Continue current medications including prn motrin  and robaxin . Reviewed dosing and side effects. Take  motrin  with food. - Discussed possible injections with PMR. She declines for now.  - Follow up with me in 8 weeks and prn.   I spent a total of 20 minutes in face-to-face and non-face-to-face activities related to this patient's care today including review of outside records, review of imaging, review of symptoms, physical exam, discussion of differential diagnosis, discussion of treatment options, and documentation.   Glade Boys PA-C Dept. of Neurosurgery "

## 2024-04-25 ENCOUNTER — Encounter: Payer: Self-pay | Admitting: Internal Medicine

## 2024-04-25 ENCOUNTER — Ambulatory Visit: Admitting: Internal Medicine

## 2024-04-25 VITALS — BP 128/84 | Ht 67.0 in | Wt 230.0 lb

## 2024-04-25 DIAGNOSIS — B181 Chronic viral hepatitis B without delta-agent: Secondary | ICD-10-CM

## 2024-04-25 DIAGNOSIS — I10 Essential (primary) hypertension: Secondary | ICD-10-CM | POA: Diagnosis not present

## 2024-04-25 DIAGNOSIS — L299 Pruritus, unspecified: Secondary | ICD-10-CM

## 2024-04-25 MED ORDER — LOSARTAN POTASSIUM-HCTZ 50-12.5 MG PO TABS: 1.0000 | ORAL_TABLET | Freq: Every day | ORAL | 1 refills | Status: AC

## 2024-04-25 NOTE — Patient Instructions (Signed)
 Pruritus Pruritus is an itchy feeling on the skin. One of the most common causes is dry skin, but many different things can cause itching. Most cases of itching do not require medical attention. Sometimes itchy skin can turn into a rash or a secondary infection. Follow these instructions at home: Skin care  Do not use scented soaps, detergents, perfumes, and cosmetic products. Instead, use gentle, unscented versions of these items. Apply moisturizing creams to your skin frequently, at least twice daily. Apply immediately after bathing while skin is still wet. Take medicines or apply medicated creams only as told by your health care provider. This may include: Corticosteroid cream or topical calcineurin inhibitor. Anti-itch lotions containing urea, camphor, or menthol. Oral antihistamines. Do not take hot showers or baths, which can make itching worse. A short, cool shower may help with itching as long as you apply moisturizing lotion after the shower. Apply a cool, wet cloth (cool compress) to the affected areas. You may take lukewarm baths with one of the following: Epsom salts. You can get these at your local pharmacy or grocery store. Follow the instructions on the packaging. Baking soda. Pour a small amount into the bath as told by your health care provider. Colloidal oatmeal. You can get this at your local pharmacy or grocery store. Follow the instructions on the packaging. Do not scratch your skin. General instructions Avoid wearing tight clothes. Keep a journal to help find out what is causing your itching. Write down: What you eat and drink. What cosmetic products you use. What soaps or detergents you use. What you wear, including jewelry. Use a humidifier. This keeps the air moist, which helps to prevent dry skin. Be aware of any changes in your itchiness. Tell your health care provider about any changes. Contact a health care provider if: The itching does not go away after  several days. You notice redness, warmth, or drainage on the skin where you have scratched. You are unusually thirsty or urinating more than normal. Your skin tingles or feels numb. Your skin or the white parts of your eyes turn yellow (jaundice). You feel weak. You have any of the following: Night sweats. Tiredness (fatigue). Weight loss. Abdominal pain. Summary Pruritus is an itchy feeling on the skin. One of the most common causes is dry skin, but many different conditions and factors can cause itching. Apply moisturizing creams to your skin frequently, at least twice daily. Apply immediately after bathing while skin is still wet. Take medicines or apply medicated creams only as told by your health care provider. Do not take hot showers or baths. Do not use scented soaps, detergents, perfumes, or cosmetic products. Keep a journal to help find out what is causing your itching. This information is not intended to replace advice given to you by your health care provider. Make sure you discuss any questions you have with your health care provider. Document Revised: 05/14/2021 Document Reviewed: 05/14/2021 Elsevier Patient Education  2024 [employer].

## 2024-04-25 NOTE — Progress Notes (Signed)
 "  Subjective:    Patient ID: Diana [name], female    DOB: [birth year], 64 y.o.   MRN: [id number]  HPI  Discussed the use of AI scribe software for clinical note transcription with the patient, who gave verbal consent to proceed.  Diana [name] is a 64 year old female with hepatitis B who presents with generalized pruritus.  She experiences random itching all over her body, which is not severe enough to cause intense scratching but is concerning enough to warrant evaluation. She recalls a previous episode of intense itching localized to a specific area during a past hepatitis B episode, but notes the current itching is less severe. She is concerned about the possibility of the itching worsening.  She mentions a follow-up with a liver specialist in August 2024, who advised her to return in a year. She finds this concerning given her history of hepatitis B.  No recent rashes are noted.     She also needs a refill of her losartan  HCT for blood pressure management.  Her BP today is 128/84  Review of Systems     Past Medical History:  Diagnosis Date   Allergy    Ear infection    recurrent   Hypertension     Current Outpatient Medications  Medication Sig Dispense Refill   Ascorbic Acid 125 MG CHEW Chew 1 each by mouth daily.     atorvastatin  (LIPITOR) 10 MG tablet Take 1 tablet (10 mg total) by mouth daily. 90 tablet 1   cetirizine  (ZYRTEC ) 10 MG tablet Take 10 mg by mouth daily as needed.     Cholecalciferol (VITAMIN D3) 25 MCG (1000 UT) CAPS Take 5,000 Units by mouth.      clonazePAM  (KLONOPIN ) 0.5 MG tablet Take 1 tablet (0.5 mg total) by mouth daily as needed. for anxiety 30 tablet 0   hydrocortisone  (ANUSOL -HC) 2.5 % rectal cream Place 1 Application rectally 2 (two) times daily. (Patient taking differently: Place 1 Application rectally as needed.) 30 g 0   ibuprofen  (ADVIL ) 200 MG tablet Take 200 mg by mouth every 6 (six) hours as needed.     losartan -hydrochlorothiazide   (HYZAAR) 50-12.5 MG tablet Take 1 tablet by mouth daily. 90 tablet 1   methocarbamol  (ROBAXIN ) 750 MG tablet Take 1 tablet (750 mg total) by mouth every 8 (eight) hours as needed for muscle spasms. 30 tablet 0   omeprazole  (PRILOSEC) 40 MG capsule Take 1 capsule (40 mg total) by mouth 2 (two) times daily. 180 capsule 1   oxybutynin (DITROPAN-XL) 10 MG 24 hr tablet Take 10 mg by mouth daily.     OZEMPIC , 0.25 OR 0.5 MG/DOSE, 2 MG/3ML SOPN Inject 0.5 mg into the skin once a week. 9 mL 1   potassium chloride  (MICRO-K ) 10 MEQ CR capsule Take 1 capsule (10 mEq total) by mouth daily. 90 capsule 1   traMADol  (ULTRAM ) 50 MG tablet Take 50 mg by mouth every 6 (six) hours as needed.     No current facility-administered medications for this visit.    No Known Allergies  Family History  Problem Relation Age of Onset   Hypertension Mother        alive   Arthritis Mother    Thyroid  disease Mother    Lung cancer Father        deceased   Heart disease Father    Hypertension Maternal Grandmother     Social History   Socioeconomic History   Marital status: Widowed  Spouse name: Not on file   Number of children: 3   Years of education: Not on file   Highest education level: 12th grade  Occupational History   Occupation: customer service    Employer: [employer]  Tobacco Use   Smoking status: Former    Current packs/day: 0.00    Types: Cigarettes    Quit date: 04/20/1985    Years since quitting: 39.0    Passive exposure: Past   Smokeless tobacco: Never   Tobacco comments:    remote tobacco use   Vaping Use   Vaping status: Never Used  Substance and Sexual Activity   Alcohol use: No   Drug use: No   Sexual activity: Not Currently    Birth control/protection: None  Other Topics Concern   Not on file  Social History Narrative   Not on file   Social Drivers of Health   Tobacco Use: Medium Risk (03/08/2024)   Patient History    Smoking Tobacco Use: Former    Smokeless Tobacco  Use: Never    Passive Exposure: Past  Physicist, Medical Strain: Low Risk (03/04/2024)   Overall Financial Resource Strain (CARDIA)    Difficulty of Paying Living Expenses: Not very hard  Food Insecurity: No Food Insecurity (03/04/2024)   Epic    Worried About Programme Researcher, Broadcasting/film/video in the Last Year: Never true    Ran Out of Food in the Last Year: Never true  Transportation Needs: No Transportation Needs (03/04/2024)   Epic    Lack of Transportation (Medical): No    Lack of Transportation (Non-Medical): No  Physical Activity: Inactive (03/04/2024)   Exercise Vital Sign    Days of Exercise per Week: 0 days    Minutes of Exercise per Session: Not on file  Stress: No Stress Concern Present (03/04/2024)   [employer] of Occupational Health - Occupational Stress Questionnaire    Feeling of Stress: Only a little  Social Connections: Moderately Isolated (03/04/2024)   Social Connection and Isolation Panel    Frequency of Communication with Friends and Family: More than three times a week    Frequency of Social Gatherings with Friends and Family: Once a week    Attends Religious Services: Never    Database Administrator or Organizations: No    Attends Engineer, Structural: Not on file    Marital Status: Living with partner  Intimate Partner Violence: Not on file  Depression (PHQ2-9): Low Risk (03/08/2024)   Depression (PHQ2-9)    PHQ-2 Score: 0  Alcohol Screen: Low Risk (02/02/2023)   Alcohol Screen    Last Alcohol Screening Score (AUDIT): 0  Housing: Low Risk (03/04/2024)   Epic    Unable to Pay for Housing in the Last Year: No    Number of Times Moved in the Last Year: 0    Homeless in the Last Year: No  Utilities: Low Risk (06/04/2023)   Received from [facility]   Utilities    In the past 12 months has the electric, gas, oil, or water company threatened to shut off services in your home? : No  Health Literacy: Not on file     Constitutional: Denies fever,  malaise, fatigue, headache or abrupt weight changes.  HEENT: Denies eye pain, eye redness, ear pain, ringing in the ears, wax buildup, runny nose, nasal congestion, bloody nose, or sore throat. Respiratory: Denies difficulty breathing, shortness of breath, cough or sputum production.   Cardiovascular: Denies chest pain, chest  tightness, palpitations or swelling in the hands or feet.  Gastrointestinal: Denies abdominal pain bloating, constipation, diarrhea or blood in the stool.  GU: Denies urgency, frequency, pain with urination, burning sensation, blood in urine, odor or discharge. Musculoskeletal: Patient reports neck pain, chronic low back pain.  Denies decrease in range of motion, difficulty with gait, or joint swelling.  Skin: Patient reports itching.  Denies redness, rashes, lesions or ulcercations.  Neurological: Patient reports insomnia, paresthesia of right lower extremity.  Denies dizziness, difficulty with memory, difficulty with speech or problems with balance and coordination.  Psych: Patient has history of anxiety.  Denies depression, SI/HI.  No other specific complaints in a complete review of systems (except as listed in HPI above).  Objective:   Physical Exam BP 128/84 (BP Location: Left Arm, Patient Position: Sitting, Cuff Size: Large)   Ht 5' 7 (1.702 m)   Wt 230 lb (104.3 kg)   LMP 01/20/2016   BMI 36.02 kg/m     Wt Readings from Last 3 Encounters:  03/08/24 226 lb (102.5 kg)  01/13/24 221 lb 12.8 oz (100.6 kg)  12/21/23 224 lb (101.6 kg)    General: Appears her stated age, obese, in NAD. Skin: Warm, dry and intact. No rash or ulcerations noted. HEENT: Head: normal shape and size; Eyes: sclera white, no icterus, conjunctiva pink, PERRLA and EOMs intact;   Cardiovascular: Normal rate and rhythm.  Pulmonary/Chest: Normal effort and positive vesicular breath sounds. No respiratory distress.  Abdomen: Soft and nontender. Normal bowel sounds.  Musculoskeletal:  No  difficulty with gait.  Neurological: Alert and oriented. Coordination normal.    BMET    Component Value Date/Time   NA 138 03/08/2024 1109   K 3.7 03/08/2024 1109   CL 99 03/08/2024 1109   CO2 28 03/08/2024 1109   GLUCOSE 117 (H) 03/08/2024 1109   BUN 12 03/08/2024 1109   CREATININE 0.67 03/08/2024 1109   CALCIUM  9.5 03/08/2024 1109   GFRNONAA >60 07/02/2021 2000   GFRAA >60 07/27/2017 2153    Lipid Panel     Component Value Date/Time   CHOL 237 (H) 03/08/2024 1109   CHOL 151 12/29/2022 1359   TRIG 78 03/08/2024 1109   TRIG 109 12/29/2022 1359   HDL 24 (L) 03/08/2024 1109   CHOLHDL 9.9 (H) 03/08/2024 1109   VLDL 21.0 02/09/2019 1245   LDLCALC 195 (H) 03/08/2024 1109    CBC    Component Value Date/Time   WBC 4.9 03/08/2024 1109   RBC 4.92 03/08/2024 1109   HGB 15.0 03/08/2024 1109   HCT 45.3 (H) 03/08/2024 1109   PLT 278 03/08/2024 1109   MCV 92.1 03/08/2024 1109   MCH 30.5 03/08/2024 1109   MCHC 33.1 03/08/2024 1109   RDW 13.0 03/08/2024 1109   LYMPHSABS 1.1 07/28/2016 1232   MONOABS 0.4 07/28/2016 1232   EOSABS 0.0 07/28/2016 1232   BASOSABS 0.0 07/28/2016 1232    Hgb A1C Lab Results  Component Value Date   HGBA1C 6.0 (H) 03/08/2024           Assessment & Plan:   Assessment and Plan    Pruritus Intermittent pruritus without rash. Possible xerosis or systemic causes. Differential includes liver or kidney disease due to hepatitis B history. - Ordered comprehensive metabolic panel to assess liver and kidney function. - Ordered hepatitis hepatitis B DNA ultra quantitative, hepatitis B surface antibody, and hepatitis B surface antigen tests.  Chronic hepatitis B Potential liver dysfunction contributing to pruritus. - Ordered  hepatitis hepatitis B DNA ultra quantitative, hepatitis B surface antibody, and hepatitis B surface antigen tests. - Ordered comprehensive metabolic panel to assess liver function.  Hypertension Managed with losartan  HCT.  No recent refill received. - Refilled losartan  HCT prescription.        RTC in 4 months for your annual exam Angeline Laura, NP  "

## 2024-04-27 ENCOUNTER — Ambulatory Visit: Payer: Self-pay | Admitting: Internal Medicine

## 2024-04-27 LAB — COMPREHENSIVE METABOLIC PANEL WITH GFR
AG Ratio: 1.9 (calc) (ref 1.0–2.5)
ALT: 13 U/L (ref 6–29)
AST: 14 U/L (ref 10–35)
Albumin: 4.5 g/dL (ref 3.6–5.1)
Alkaline phosphatase (APISO): 97 U/L (ref 37–153)
BUN: 11 mg/dL (ref 7–25)
CO2: 29 mmol/L (ref 20–32)
Calcium: 9.5 mg/dL (ref 8.6–10.4)
Chloride: 101 mmol/L (ref 98–110)
Creat: 0.7 mg/dL (ref 0.50–1.05)
Globulin: 2.4 g/dL (ref 1.9–3.7)
Glucose, Bld: 100 mg/dL (ref 65–139)
Potassium: 3.7 mmol/L (ref 3.5–5.3)
Sodium: 139 mmol/L (ref 135–146)
Total Bilirubin: 2.2 mg/dL — ABNORMAL HIGH (ref 0.2–1.2)
Total Protein: 6.9 g/dL (ref 6.1–8.1)
eGFR: 98 mL/min/1.73m2

## 2024-04-27 LAB — HEPATITIS B SURFACE ANTIGEN: Hepatitis B Surface Ag: NONREACTIVE

## 2024-04-27 LAB — HEPATITIS B SURFACE ANTIBODY,QUALITATIVE: Hep B S Ab: REACTIVE — AB

## 2024-04-27 LAB — HEPATITIS B DNA, ULTRAQUANTITATIVE, PCR
Hepatitis B DNA: NOT DETECTED [IU]/mL
Hepatitis B virus DNA: NOT DETECTED {Log_IU}/mL

## 2024-05-04 ENCOUNTER — Ambulatory Visit: Admitting: Orthopedic Surgery

## 2024-05-17 NOTE — Progress Notes (Unsigned)
 "  Referring Physician:  Antonette Angeline ORN, NP [address] [place],  [place] [address]  Primary Physician:  Antonette Angeline ORN, NP  History of Present Illness: [name] has a history of HTN, GERD, chronic hep B, hyperlipidemia, DM, and prediabetes.   History of chronic constant LBP with right leg pain with flare ups. She has known lumbar spondylosis with disc bulging at L4-S1. Mild central stenosis L4-L5.   Last seen by me on 12/21/23 for constant LBP with intermittent burning in buttocks with sitting. She has known lumbar spondylosis with disc bulging at L4-S1. Mild central stenosis L4-L5.   Injections with PMR discussed and she declined. She was to start PT at [facility]- she has not done this.   PCP wanted her seen prior to starting any PT.          She did not see much relief with PT. She continues with constant LBP with intermittent burning pain in her buttocks with sitting. She has intermittent right > left posterior leg pain. She can only shop/walk for about a 90 minutes before she has to stop due to LBP/leg pain. She has numbness in her legs. No weakness. Her pain is worse in the morning and better as she moves around.   She is taking prn motrin  and robaxin . She stopped neurontin  as it was not helping.   She is retired.   She does not smoke. She quit in 1987.    Bowel/Bladder Dysfunction: none  Conservative measures:  Physical therapy: [facility]- initial eval on 08/12/23 and she did 5 more visits through 09/07/23 Multimodal medical therapy including regular antiinflammatories: prednisone  taper, robaxin , mobic , tramadol   Injections:  Had 9-10 years ago with no relief   Past Surgery: No spinal surgery   Review of Systems:  A 10 point review of systems is negative, except for the pertinent positives and negatives detailed in the HPI.  Past Medical History: Past Medical History:  Diagnosis Date   Allergy    Ear infection    recurrent   Hypertension     Past  Surgical History: Past Surgical History:  Procedure Laterality Date   ABDOMINAL HYSTERECTOMY  2019   partial   COLONOSCOPY WITH PROPOFOL  N/A 11/10/2021   Procedure: COLONOSCOPY WITH PROPOFOL ;  Surgeon: Unk Corinn Skiff, MD;  Location: [facility] ENDOSCOPY;  Service: Gastroenterology;  Laterality: N/A;   TONSILLECTOMY     TUBAL LIGATION     bilateral    Allergies: Allergies as of 05/22/2024   (No Known Allergies)    Medications: Outpatient Encounter Medications as of 05/22/2024  Medication Sig   Ascorbic Acid 125 MG CHEW Chew 1 each by mouth daily.   atorvastatin  (LIPITOR) 10 MG tablet Take 1 tablet (10 mg total) by mouth daily.   cetirizine  (ZYRTEC ) 10 MG tablet Take 10 mg by mouth daily as needed.   Cholecalciferol (VITAMIN D3) 25 MCG (1000 UT) CAPS Take 5,000 Units by mouth.    clonazePAM  (KLONOPIN ) 0.5 MG tablet Take 1 tablet (0.5 mg total) by mouth daily as needed. for anxiety   hydrocortisone  (ANUSOL -HC) 2.5 % rectal cream Place 1 Application rectally 2 (two) times daily. (Patient taking differently: Place 1 Application rectally as needed.)   ibuprofen  (ADVIL ) 200 MG tablet Take 200 mg by mouth every 6 (six) hours as needed.   losartan -hydrochlorothiazide  (HYZAAR) 50-12.5 MG tablet Take 1 tablet by mouth daily.   methocarbamol  (ROBAXIN ) 750 MG tablet Take 1 tablet (750 mg total) by mouth every 8 (eight)  hours as needed for muscle spasms.   omeprazole  (PRILOSEC) 40 MG capsule Take 1 capsule (40 mg total) by mouth 2 (two) times daily.   oxybutynin (DITROPAN-XL) 10 MG 24 hr tablet Take 10 mg by mouth daily.   OZEMPIC , 0.25 OR 0.5 MG/DOSE, 2 MG/3ML SOPN Inject 0.5 mg into the skin once a week.   potassium chloride  (MICRO-K ) 10 MEQ CR capsule Take 1 capsule (10 mEq total) by mouth daily.   traMADol  (ULTRAM ) 50 MG tablet Take 50 mg by mouth every 6 (six) hours as needed.   No facility-administered encounter medications on file as of 05/22/2024.    Social History: Social History    Tobacco Use   Smoking status: Former    Current packs/day: 0.00    Types: Cigarettes    Quit date: 04/20/1985    Years since quitting: 39.1    Passive exposure: Past   Smokeless tobacco: Never   Tobacco comments:    remote tobacco use   Vaping Use   Vaping status: Never Used  Substance Use Topics   Alcohol use: No   Drug use: No    Family Medical History: Family History  Problem Relation Age of Onset   Hypertension Mother        alive   Arthritis Mother    Thyroid  disease Mother    Lung cancer Father        deceased   Heart disease Father    Hypertension Maternal Grandmother     Physical Examination: There were no vitals filed for this visit.   Awake, alert, oriented to person, place, and time.  Speech is clear and fluent. Fund of knowledge is appropriate.   Cranial Nerves: Pupils equal round and reactive to light.  Facial tone is symmetric.    No abnormal lesions on exposed skin.   She has diffuse lower lumbar tenderness.   Strength:  Side Iliopsoas Quads Hamstring PF DF EHL  R 5 5 5 5 5 5   L 5 5 5 5 5 5    Reflexes are 2+ and symmetric at the patella and achilles.    Clonus is not present.   Bilateral lower extremity sensation is intact to light touch.   Gait is normal.    Medical Decision Making  Imaging: None   Assessment and Plan: Ms. Ortmann has constant LBP with intermittent burning pain in her buttocks with sitting. She has intermittent right > left posterior leg pain. She can only shop/walk for about a 90 minutes before she has to stop due to LBP/leg pain. She has numbness in her legs. No weakness.    She has know lumbar spondylosis with disc bulging at L4-S1. Mild central stenosis L4-L5.    Treatment options discussed with patient and following plan made:   - PT for lumbar spine. Orders to [facility]- she will call them to schedule a visit.  - Continue current medications including prn motrin  and robaxin . Reviewed dosing and side effects. Take  motrin  with food. - Discussed possible injections with PMR. She declines for now.  - Follow up with me in 8 weeks and prn.   I spent a total of 20 minutes in face-to-face and non-face-to-face activities related to this patient's care today including review of outside records, review of imaging, review of symptoms, physical exam, discussion of differential diagnosis, discussion of treatment options, and documentation.   Glade Boys PA-C Dept. of Neurosurgery "

## 2024-05-22 ENCOUNTER — Ambulatory Visit: Admitting: Orthopedic Surgery

## 2024-05-25 ENCOUNTER — Other Ambulatory Visit: Payer: Self-pay | Admitting: Internal Medicine

## 2024-05-26 NOTE — Telephone Encounter (Signed)
 Requested Prescriptions  Pending Prescriptions Disp Refills   OZEMPIC , 0.25 OR 0.5 MG/DOSE, 2 MG/3ML SOPN [Pharmacy Med Name: OZEMPIC  0.25 OR 0.5 MG/DOS 2 Solution Pen-injector] 9 mL 0    Sig: INJECT 0.5 MG INTO THE SKIN ONCE A WEEK.     Endocrinology:  Diabetes - GLP-1 Receptor Agonists - semaglutide  Failed - 05/26/2024  3:06 PM      Failed - HBA1C in normal range and within 180 days    Hgb A1c MFr Bld  Date Value Ref Range Status  03/08/2024 6.0 (H) <5.7 % Final    Comment:    For someone without known diabetes, a hemoglobin  A1c value between 5.7% and 6.4% is consistent with prediabetes and should be confirmed with a  follow-up test. . For someone with known diabetes, a value <7% indicates that their diabetes is well controlled. A1c targets should be individualized based on duration of diabetes, age, comorbid conditions, and other considerations. . This assay result is consistent with an increased risk of diabetes. . Currently, no consensus exists regarding use of hemoglobin A1c for diagnosis of diabetes for children. .          Passed - Cr in normal range and within 360 days    Creat  Date Value Ref Range Status  04/25/2024 0.70 0.50 - 1.05 mg/dL Final   Creatinine, Urine  Date Value Ref Range Status  03/08/2024 183 20 - 275 mg/dL Final         Passed - Valid encounter within last 6 months    Recent Outpatient Visits           1 month ago Pruritus   [place] [facility] [place], [place] W, NP   2 months ago Type 2 diabetes mellitus without complication, without long-term current use of insulin [facility])   [place] [facility] [place], Angeline ORN, NP   4 months ago External ulcerated hemorrhoids   [place] [facility] [place], Angeline ORN, NP   5 months ago Vaginal irritation   [place] [facility] [place], Angeline ORN, NP   7 months ago Urinary urgency   [place] [facility] [place],  Angeline ORN, [place]

## 2024-06-13 ENCOUNTER — Ambulatory Visit: Admitting: Orthopedic Surgery

## 2024-09-07 ENCOUNTER — Encounter: Admitting: Internal Medicine
